# Patient Record
Sex: Female | Born: 1945
Health system: Southern US, Community
[De-identification: ages and names within clinical notes are randomized; demographics above are authoritative.]

## PROBLEM LIST (undated history)

## (undated) DIAGNOSIS — Z9981 Dependence on supplemental oxygen: Secondary | ICD-10-CM

## (undated) DIAGNOSIS — M1990 Unspecified osteoarthritis, unspecified site: Secondary | ICD-10-CM

## (undated) DIAGNOSIS — IMO0001 Reserved for inherently not codable concepts without codable children: Secondary | ICD-10-CM

## (undated) DIAGNOSIS — M549 Dorsalgia, unspecified: Secondary | ICD-10-CM

## (undated) DIAGNOSIS — G8929 Other chronic pain: Secondary | ICD-10-CM

## (undated) DIAGNOSIS — F419 Anxiety disorder, unspecified: Secondary | ICD-10-CM

## (undated) DIAGNOSIS — E079 Disorder of thyroid, unspecified: Secondary | ICD-10-CM

## (undated) DIAGNOSIS — K219 Gastro-esophageal reflux disease without esophagitis: Secondary | ICD-10-CM

## (undated) DIAGNOSIS — E039 Hypothyroidism, unspecified: Secondary | ICD-10-CM

## (undated) DIAGNOSIS — J449 Chronic obstructive pulmonary disease, unspecified: Secondary | ICD-10-CM

## (undated) DIAGNOSIS — D649 Anemia, unspecified: Secondary | ICD-10-CM

## (undated) DIAGNOSIS — I10 Essential (primary) hypertension: Secondary | ICD-10-CM

## (undated) HISTORY — PX: TOTAL HIP ARTHROPLASTY: SHX124

## (undated) HISTORY — DX: Other chronic pain: G89.29

## (undated) HISTORY — DX: Dorsalgia, unspecified: M54.9

## (undated) HISTORY — PX: OTHER SURGICAL HISTORY: SHX169

## (undated) HISTORY — PX: WRIST SURGERY: SHX841

## (undated) HISTORY — DX: Anxiety disorder, unspecified: F41.9

## (undated) HISTORY — PX: ABDOMINAL HYSTERECTOMY: SHX81

---

## 1998-05-29 HISTORY — PX: JOINT REPLACEMENT: SHX530

## 1998-07-19 ENCOUNTER — Inpatient Hospital Stay (HOSPITAL_COMMUNITY): Admission: AD | Admit: 1998-07-19 | Discharge: 1998-07-26 | Payer: Self-pay | Admitting: Surgery

## 1998-07-19 ENCOUNTER — Encounter: Payer: Self-pay | Admitting: Surgery

## 1998-07-21 ENCOUNTER — Encounter: Payer: Self-pay | Admitting: Surgery

## 1998-07-22 ENCOUNTER — Encounter: Payer: Self-pay | Admitting: Surgery

## 1998-07-23 ENCOUNTER — Encounter: Payer: Self-pay | Admitting: Surgery

## 1998-07-24 ENCOUNTER — Encounter: Payer: Self-pay | Admitting: Surgery

## 1998-07-25 ENCOUNTER — Encounter: Payer: Self-pay | Admitting: Surgery

## 1998-07-26 ENCOUNTER — Encounter: Payer: Self-pay | Admitting: Cardiothoracic Surgery

## 2000-09-26 ENCOUNTER — Encounter: Payer: Self-pay | Admitting: Family Medicine

## 2000-09-26 ENCOUNTER — Ambulatory Visit (HOSPITAL_COMMUNITY): Admission: RE | Admit: 2000-09-26 | Discharge: 2000-09-26 | Payer: Self-pay | Admitting: Family Medicine

## 2001-02-12 ENCOUNTER — Encounter: Payer: Self-pay | Admitting: Internal Medicine

## 2001-02-12 ENCOUNTER — Ambulatory Visit (HOSPITAL_COMMUNITY): Admission: RE | Admit: 2001-02-12 | Discharge: 2001-02-12 | Payer: Self-pay | Admitting: Internal Medicine

## 2001-02-19 ENCOUNTER — Ambulatory Visit (HOSPITAL_COMMUNITY): Admission: RE | Admit: 2001-02-19 | Discharge: 2001-02-19 | Payer: Self-pay | Admitting: Orthopedic Surgery

## 2001-02-19 ENCOUNTER — Encounter: Payer: Self-pay | Admitting: Orthopedic Surgery

## 2001-05-27 ENCOUNTER — Ambulatory Visit (HOSPITAL_COMMUNITY): Admission: RE | Admit: 2001-05-27 | Discharge: 2001-05-27 | Payer: Self-pay | Admitting: Internal Medicine

## 2001-05-27 ENCOUNTER — Encounter: Payer: Self-pay | Admitting: Internal Medicine

## 2002-01-23 ENCOUNTER — Emergency Department (HOSPITAL_COMMUNITY): Admission: EM | Admit: 2002-01-23 | Discharge: 2002-01-23 | Payer: Self-pay | Admitting: Internal Medicine

## 2002-01-23 ENCOUNTER — Encounter: Payer: Self-pay | Admitting: Internal Medicine

## 2002-03-24 ENCOUNTER — Encounter: Payer: Self-pay | Admitting: Internal Medicine

## 2002-03-24 ENCOUNTER — Ambulatory Visit (HOSPITAL_COMMUNITY): Admission: RE | Admit: 2002-03-24 | Discharge: 2002-03-24 | Payer: Self-pay | Admitting: Internal Medicine

## 2002-11-29 ENCOUNTER — Emergency Department (HOSPITAL_COMMUNITY): Admission: EM | Admit: 2002-11-29 | Discharge: 2002-11-29 | Payer: Self-pay | Admitting: Emergency Medicine

## 2002-11-29 ENCOUNTER — Encounter: Payer: Self-pay | Admitting: Emergency Medicine

## 2002-12-09 ENCOUNTER — Ambulatory Visit (HOSPITAL_COMMUNITY): Admission: RE | Admit: 2002-12-09 | Discharge: 2002-12-09 | Payer: Self-pay | Admitting: General Surgery

## 2002-12-09 ENCOUNTER — Encounter: Payer: Self-pay | Admitting: Internal Medicine

## 2003-05-30 HISTORY — PX: FRACTURE SURGERY: SHX138

## 2003-07-08 ENCOUNTER — Ambulatory Visit (HOSPITAL_COMMUNITY): Admission: RE | Admit: 2003-07-08 | Discharge: 2003-07-08 | Payer: Self-pay | Admitting: Internal Medicine

## 2003-09-10 ENCOUNTER — Emergency Department (HOSPITAL_COMMUNITY): Admission: EM | Admit: 2003-09-10 | Discharge: 2003-09-10 | Payer: Self-pay | Admitting: Emergency Medicine

## 2003-09-16 ENCOUNTER — Emergency Department (HOSPITAL_COMMUNITY): Admission: EM | Admit: 2003-09-16 | Discharge: 2003-09-17 | Payer: Self-pay | Admitting: Emergency Medicine

## 2003-09-23 ENCOUNTER — Ambulatory Visit (HOSPITAL_COMMUNITY): Admission: RE | Admit: 2003-09-23 | Discharge: 2003-09-23 | Payer: Self-pay | Admitting: Orthopedic Surgery

## 2003-12-02 ENCOUNTER — Encounter (HOSPITAL_COMMUNITY): Admission: RE | Admit: 2003-12-02 | Discharge: 2004-01-01 | Payer: Self-pay | Admitting: Orthopedic Surgery

## 2004-01-04 ENCOUNTER — Encounter (HOSPITAL_COMMUNITY): Admission: RE | Admit: 2004-01-04 | Discharge: 2004-02-03 | Payer: Self-pay | Admitting: Orthopedic Surgery

## 2004-02-03 ENCOUNTER — Encounter (HOSPITAL_COMMUNITY): Admission: RE | Admit: 2004-02-03 | Discharge: 2004-02-26 | Payer: Self-pay | Admitting: Orthopedic Surgery

## 2004-02-19 ENCOUNTER — Ambulatory Visit (HOSPITAL_COMMUNITY): Admission: RE | Admit: 2004-02-19 | Discharge: 2004-02-19 | Payer: Self-pay | Admitting: Internal Medicine

## 2004-02-29 ENCOUNTER — Encounter (HOSPITAL_COMMUNITY): Admission: RE | Admit: 2004-02-29 | Discharge: 2004-03-30 | Payer: Self-pay | Admitting: Orthopedic Surgery

## 2004-04-04 ENCOUNTER — Ambulatory Visit: Payer: Self-pay | Admitting: Orthopedic Surgery

## 2004-04-06 ENCOUNTER — Encounter (HOSPITAL_COMMUNITY): Admission: RE | Admit: 2004-04-06 | Discharge: 2004-05-06 | Payer: Self-pay | Admitting: Orthopedic Surgery

## 2004-05-09 ENCOUNTER — Encounter (HOSPITAL_COMMUNITY): Admission: RE | Admit: 2004-05-09 | Discharge: 2004-05-27 | Payer: Self-pay | Admitting: Orthopedic Surgery

## 2004-05-11 ENCOUNTER — Ambulatory Visit: Payer: Self-pay | Admitting: Orthopedic Surgery

## 2004-09-15 ENCOUNTER — Ambulatory Visit: Payer: Self-pay | Admitting: Orthopedic Surgery

## 2005-02-20 ENCOUNTER — Ambulatory Visit (HOSPITAL_COMMUNITY): Admission: RE | Admit: 2005-02-20 | Discharge: 2005-02-20 | Payer: Self-pay | Admitting: Internal Medicine

## 2005-05-29 HISTORY — PX: COLONOSCOPY: SHX174

## 2005-09-26 ENCOUNTER — Ambulatory Visit: Payer: Self-pay | Admitting: Internal Medicine

## 2005-09-26 ENCOUNTER — Ambulatory Visit (HOSPITAL_COMMUNITY): Admission: RE | Admit: 2005-09-26 | Discharge: 2005-09-26 | Payer: Self-pay | Admitting: Internal Medicine

## 2006-03-02 ENCOUNTER — Ambulatory Visit (HOSPITAL_COMMUNITY): Admission: RE | Admit: 2006-03-02 | Discharge: 2006-03-02 | Payer: Self-pay | Admitting: Internal Medicine

## 2007-01-17 ENCOUNTER — Emergency Department (HOSPITAL_COMMUNITY): Admission: EM | Admit: 2007-01-17 | Discharge: 2007-01-17 | Payer: Self-pay | Admitting: Emergency Medicine

## 2007-01-22 ENCOUNTER — Ambulatory Visit: Payer: Self-pay | Admitting: Internal Medicine

## 2007-02-22 ENCOUNTER — Ambulatory Visit: Payer: Self-pay | Admitting: Internal Medicine

## 2007-03-04 ENCOUNTER — Ambulatory Visit (HOSPITAL_COMMUNITY): Admission: RE | Admit: 2007-03-04 | Discharge: 2007-03-04 | Payer: Self-pay | Admitting: Internal Medicine

## 2007-09-26 ENCOUNTER — Emergency Department (HOSPITAL_COMMUNITY): Admission: EM | Admit: 2007-09-26 | Discharge: 2007-09-26 | Payer: Self-pay | Admitting: Emergency Medicine

## 2008-03-25 ENCOUNTER — Emergency Department (HOSPITAL_COMMUNITY): Admission: EM | Admit: 2008-03-25 | Discharge: 2008-03-25 | Payer: Self-pay | Admitting: Emergency Medicine

## 2008-04-22 ENCOUNTER — Ambulatory Visit (HOSPITAL_COMMUNITY): Admission: RE | Admit: 2008-04-22 | Discharge: 2008-04-22 | Payer: Self-pay | Admitting: Internal Medicine

## 2009-02-06 ENCOUNTER — Inpatient Hospital Stay (HOSPITAL_COMMUNITY): Admission: EM | Admit: 2009-02-06 | Discharge: 2009-02-12 | Payer: Self-pay | Admitting: Emergency Medicine

## 2009-05-12 ENCOUNTER — Ambulatory Visit (HOSPITAL_COMMUNITY): Admission: RE | Admit: 2009-05-12 | Discharge: 2009-05-12 | Payer: Self-pay | Admitting: Internal Medicine

## 2010-05-16 ENCOUNTER — Ambulatory Visit (HOSPITAL_COMMUNITY)
Admission: RE | Admit: 2010-05-16 | Discharge: 2010-05-16 | Payer: Self-pay | Source: Home / Self Care | Attending: Internal Medicine | Admitting: Internal Medicine

## 2010-06-18 ENCOUNTER — Encounter: Payer: Self-pay | Admitting: Internal Medicine

## 2010-06-19 ENCOUNTER — Encounter: Payer: Self-pay | Admitting: Internal Medicine

## 2010-09-02 LAB — BLOOD GAS, ARTERIAL
Acid-Base Excess: 1.4 mmol/L (ref 0.0–2.0)
Acid-base deficit: 1.1 mmol/L (ref 0.0–2.0)
Bicarbonate: 25 mEq/L — ABNORMAL HIGH (ref 20.0–24.0)
O2 Content: 3 L/min
O2 Saturation: 84.2 %
O2 Saturation: 97.6 %
Patient temperature: 37
TCO2: 23.2 mmol/L (ref 0–100)
pO2, Arterial: 142 mmHg — ABNORMAL HIGH (ref 80.0–100.0)
pO2, Arterial: 56.2 mmHg — ABNORMAL LOW (ref 80.0–100.0)

## 2010-09-02 LAB — BASIC METABOLIC PANEL
BUN: 11 mg/dL (ref 6–23)
BUN: 8 mg/dL (ref 6–23)
CO2: 34 mEq/L — ABNORMAL HIGH (ref 19–32)
CO2: 26 mEq/L (ref 19–32)
Calcium: 8.5 mg/dL (ref 8.4–10.5)
Chloride: 102 mEq/L (ref 96–112)
Chloride: 105 mEq/L (ref 96–112)
Chloride: 110 mEq/L (ref 96–112)
Creatinine, Ser: 0.67 mg/dL (ref 0.4–1.2)
Creatinine, Ser: 0.63 mg/dL (ref 0.4–1.2)
GFR calc Af Amer: >60 mL/min (ref >60)
GFR calc Af Amer: >60 mL/min (ref >60)
Glucose, Bld: 194 mg/dL — ABNORMAL HIGH (ref 70–99)
Potassium: 4.1 mEq/L (ref 3.5–5.1)
Potassium: 3.5 mEq/L (ref 3.5–5.1)
Sodium: 139 mEq/L (ref 135–145)

## 2010-09-02 LAB — CBC
HCT: 30.5 % — ABNORMAL LOW (ref 36.0–46.0)
HCT: 37.2 % (ref 36.0–46.0)
Hemoglobin: 12.6 g/dL (ref 12.0–15.0)
Hemoglobin: 9.8 g/dL — ABNORMAL LOW (ref 12.0–15.0)
MCHC: 34.2 g/dL (ref 30.0–36.0)
MCHC: 33.7 g/dL (ref 30.0–36.0)
MCHC: 34 g/dL (ref 30.0–36.0)
MCV: 83.6 fL (ref 78.0–100.0)
MCV: 83.2 fL (ref 78.0–100.0)
MCV: 83.7 fL (ref 78.0–100.0)
Platelets: 188 10*3/uL (ref 150–400)
Platelets: 211 10*3/uL (ref 150–400)
Platelets: 346 10*3/uL (ref 150–400)
RBC: 3.65 MIL/uL — ABNORMAL LOW (ref 3.87–5.11)
RBC: 3.35 MIL/uL — ABNORMAL LOW (ref 3.87–5.11)
RDW: 14.6 % (ref 11.5–15.5)
RDW: 14.6 % (ref 11.5–15.5)

## 2010-09-02 LAB — DIFFERENTIAL
Basophils Absolute: 0 10*3/uL (ref 0.0–0.1)
Basophils Absolute: 0 10*3/uL (ref 0.0–0.1)
Basophils Relative: 0 % (ref 0–1)
Basophils Relative: 1 % (ref 0–1)
Eosinophils Absolute: 0 10*3/uL (ref 0.0–0.7)
Eosinophils Absolute: 0 10*3/uL (ref 0.0–0.7)
Eosinophils Absolute: 0 10*3/uL (ref 0.0–0.7)
Eosinophils Absolute: 0.2 10*3/uL (ref 0.0–0.7)
Eosinophils Relative: 0 % (ref 0–5)
Eosinophils Relative: 1 % (ref 0–5)
Lymphocytes Relative: 19 % (ref 12–46)
Lymphs Abs: 0.8 10*3/uL (ref 0.7–4.0)
Lymphs Abs: 3 10*3/uL (ref 0.7–4.0)
Monocytes Absolute: 0.6 10*3/uL (ref 0.1–1.0)
Monocytes Absolute: 0.4 10*3/uL (ref 0.1–1.0)
Monocytes Absolute: 0.7 10*3/uL (ref 0.1–1.0)
Monocytes Relative: 10 % (ref 3–12)
Monocytes Relative: 7 % (ref 3–12)
Neutro Abs: 4.1 10*3/uL (ref 1.7–7.7)
Neutro Abs: 4.5 10*3/uL (ref 1.7–7.7)
Neutrophils Relative %: 63 % (ref 43–77)
Neutrophils Relative %: 69 % (ref 43–77)
Neutrophils Relative %: 79 % — ABNORMAL HIGH (ref 43–77)

## 2010-09-02 LAB — TSH: TSH: 0.028 u[IU]/mL — ABNORMAL LOW (ref 0.350–4.500)

## 2010-09-02 LAB — COMPREHENSIVE METABOLIC PANEL
ALT: 39 U/L — ABNORMAL HIGH (ref 0–35)
Albumin: 2.7 g/dL — ABNORMAL LOW (ref 3.5–5.2)
Calcium: 8.4 mg/dL (ref 8.4–10.5)
Glucose, Bld: 118 mg/dL — ABNORMAL HIGH (ref 70–99)
Sodium: 142 mEq/L (ref 135–145)
Total Protein: 6.1 g/dL (ref 6.0–8.3)

## 2010-10-11 NOTE — Assessment & Plan Note (Signed)
[facility]                             PULMONARY OFFICE NOTE   Marilyn Solomon, Marilyn Solomon                   MRN:          [id number]  DATE:02/22/2007                            DOB:          Mar 25, 1946    A 65 year old white female seen at Dr. Arnoldo Morale' request on August 26 for  evaluation of a right apical density on CT scan that actually had  present since 2003 and appeared to progress somewhat in the setting of  recent purulent tracheobronchitis.  The patient states she is completely  back to normal baseline which includes no significant dyspnea of  activity, albeit at a very sedentary lifestyle.  She denies any  variability in terms of dyspnea, significant cough, chest pain, fevers,  chills, sweats, and unintended weight loss.   PHYSICAL EXAMINATION:  She is a slightly anxious pleasant white female  in no acute distress weighing 138 pounds which is up 3 pounds .  HEENT:  Unremarkable.  Pharynx clear.  Lung fields reveal diminished breath sounds but no wheezing.  There is a regular rhythm without murmur, gallop, or rub.  ABDOMEN:  Soft, benign.  EXTREMITIES:  Warm without calf tenderness, cyanosis, clubbing, or  edema.   Chest x-ray shows no change in mild increased markings at the right  apex.   PFTs today revealed an FEV1 of 50% radicular ratio, 53% consistent with  moderate airflow obstruction with a diffusing capacity of only 66%.  She  did not improve after bronchodilators.   IMPRESSION:  Emphysematous chronic obstructive pulmonary disease,  moderate in nature associated with apical/pleural scarring in the right.  I cannot totally exclude cancer here, but the problem has been present  since 2003 and therefore might potentially represent a scar carcinoma  but more than likely simply represents a low-grade inflammatory process  with scar in such as one might see in atypical mycobacterial infection.   Using the strategy that the punishment  needs to fit the crime, other  than maintaining off smoking and using albuterol p.r.n., I do not  recommend any more aggressive therapy here but would like to see the  patient back every 3 months.  If, on the other hand she begins having  more frequent exacerbations of chronic obstructive pulmonary disease, I  would recommend she be maintained on Advair 280/50 b.i.d.  If she begins  having persistent purulent sputum, we might need to consider  bronchoscopy to look for opportunistic infections like pseudomonas or  mycobacterium avium-intracellulare.     Christena Deem. [name], MD, [facility]  Electronically Signed   MBW/MedQ  DD: 02/22/2007  DT: 02/22/2007  Job #: [id number]   cc:   Jana Hakim, M.D.

## 2010-10-11 NOTE — Assessment & Plan Note (Signed)
[facility]                             PULMONARY OFFICE NOTE   Marilyn Solomon, Marilyn Solomon                   MRN:          [id number]  DATE:01/22/2007                            DOB:          1946-05-01    CHIEF COMPLAINT:  Mass on the lung.   HISTORY:  This is a 65 year old white female who quit smoking eight  years ago and had no significant chronic respiratory complaints but  abruptly on August 17 developed fever with green sputum production and a  congested cough.  She was seen with a vague right upper lobe infiltrate  and a CT on August 21 with evidence of a progressive right apical  scarring, and therefore seen at Dr. Arnoldo Morale' request.  However, the  patient in the meantime feels fine except for a mass on the chest.   She does have a history of mild seasonal rhinitis but denies any  pleuritic pain, fevers, chills, sweats, orthopnea, PND, leg swelling, or  unintended weight loss.   PAST MEDICAL HISTORY:  Significant for hip replacement in March, 2000  and wrist surgery in April, 2005.  Hysterectomy.  Chronic sinus  problems.  She also has a history of hypothyroidism and osteoporosis.   Medications include Synthroid and Actonel.   ALLERGIES:  ASPIRIN causes hives.   SOCIAL HISTORY:  She quit smoking in 2000.  Denies any unusual travel,  pet, or hobby exposure.   FAMILY HISTORY:  Positive for cancer of the bone in one sister and  pancreas in one sister.   REVIEW OF SYSTEMS:  Taken in detail on the work sheet and negative  except as outlined above.   PHYSICAL EXAMINATION:  This is a depressed ambulatory white female who  has difficulty answering questions in a straightforward manner and in  fact did not understand how to fill out the questionnaire where it says,  previous history of problems.  She thought it meant absence of  problems, for example.  HEENT:  Unremarkable except for the fact that she is edentulous.  Oropharynx is clear.   Nasal turbinates are normal with no crusting.  Ear  canals are clear bilaterally.  NECK:  Supple without cervical adenopathy or tenderness.  LUNGS:  Lung fields reveal minimal generalized rhonchi but no  localization or wheezing.  Regular rate and rhythm without murmur, rub or gallop.  No increase in  P2.  ABDOMEN:  Soft, benign.  EXTREMITIES:  Warm without calf tenderness, clubbing, cyanosis or edema.  NEUROLOGIC:  No focal deficits or pathologic reflexes.  SKIN:  Warm and dry.   Chest x-ray is obtained and reveals minimal increase in right upper lobe  markings, compared to previous study from July 08, 2003 and  corresponds to a CT scan that shows multiple parenchymal densities in  the right upper lobe with diffuse airway thickening and mild mediastinal  adenopathy.  The CT scan on August 21 was compared to previous studies  available from August, 2003.   IMPRESSION:  This patient has had mild parenchymal scarring in the right  upper lobe that has progressed minimally over five years and therefore  is not likely to represent cancer, although a scar carcinoma is a  remote possibilty.  Her acute worsening is suggestive of an acute  tracheal bronchitis and/or a bronchiectatic flare and was treated  already appropriately with Augmentin.  It is very unlikely that we will  find anything other than perhaps a few atypical mycobacteria in her  right upper lobe, and I am not sure that this would warrant more  aggressive therapy than watchful waiting.  However, I think it is very  unlikely that she had a mass on her lung nor that bronchoscopy would  be of any value, at least in the short run.   I have arranged to see her back in four weeks for a set of PFTs and  chest x-ray.  We will certainly see her sooner if she has exacerbation  of cough or any history of a hemoptysis, pleuritic pain in the interim.     Marilyn Solomon. [name], MD, [facility]  Electronically Signed    MBW/MedQ  DD: 01/22/2007   DT: 01/23/2007  Job #: [id number]   cc:   Jana Hakim, M.D.

## 2010-10-14 NOTE — Op Note (Signed)
NAMESHONTEE, ELEBY            ACCOUNT NO.:  [id number]   MEDICAL RECORD NO.:  [id number]          PATIENT TYPE:  AMB   LOCATION:  DAY                           FACILITY:  [facility]   PHYSICIAN:  R. Garfield Cornea, M.D. DATE OF BIRTH:  09/23/1945   DATE OF PROCEDURE:  09/26/2005  DATE OF DISCHARGE:                                 OPERATIVE REPORT   PROCEDURE:  Screening colonoscopy.   INDICATION FOR PROCEDURE:  A 65 year old lady devoid of any GI symptoms,  referred out of the courtesy of Jana Hakim, M.D., for colorectal  cancer screening.  She has never had her lower GI tract imaged.  There is no  family history of colorectal neoplasia.  Colonoscopy is now being done as a  screening maneuver.  This approach has been discussed with the patient at  length, the potential risks, benefits, and alternatives have been reviewed,  questions answered.  Patient agreeable.   PROCEDURE NOTE:  O2 saturation, blood pressure, pulse, and respiration were  monitored throughout the entire procedure.   CONSCIOUS SEDATION:  Versed 3 mg IV, Demerol 50 mg IV.   INSTRUMENT USED:  Olympus video chip system, pediatric scope.   FINDINGS:  Digital rectal exam revealed no abnormalities.  Endoscopic  findings:  The prep was good.   Rectum:  Examination of the rectal mucosa including a retroflexed view of  the anal verge revealed no abnormalities.   Colon:  The colonic mucosa was surveyed from the rectosigmoid junction  through the left, transverse and right colon to the area of the appendiceal  orifice, ileocecal valve and cecum.  These structures were well-seen and  photographed for the record.  From this level the scope was slowly and  cautiously withdrawn.  All previously-mentioned mucosal surfaces were again  seen.  The colonic mucosa appeared normal aside from scattered left-sided  diverticula.  The patient tolerated the procedure well, was reacted in  endoscopy.   IMPRESSION:  1.  Normal  rectum.  2.  Left-sided diverticula, the remainder of the colonic mucosa appeared      normal.   RECOMMENDATIONS:  1.  Diverticulosis literature provided to Ms. Valere Dross.  2.  Routine screening colonoscopy in 10 years.      Bridgette Habermann, M.D.  Electronically Signed     RMR/MEDQ  D:  09/26/2005  T:  09/26/2005  Job:  [id number]   cc:   Jana Hakim, M.D.  Fax: [phone]

## 2010-10-14 NOTE — Op Note (Signed)
NAME:  Marilyn Solomon, Marilyn Solomon                      ACCOUNT NO.:  [id number]   MEDICAL RECORD NO.:  [id number]                   PATIENT TYPE:  AMB   LOCATION:  DAY                                  FACILITY:  [facility]   PHYSICIAN:  Carole Civil, M.D.           DATE OF BIRTH:  1945/11/03   DATE OF PROCEDURE:  09/23/2003  DATE OF DISCHARGE:                                 OPERATIVE REPORT   PREOPERATIVE DIAGNOSIS:  Comminuted distal radius fracture, right wrist.   POSTOPERATIVE DIAGNOSIS:  Comminuted distal radius fracture, right wrist.   OPERATION/PROCEDURE:  Closed reduction, external fixation and percutaneous  pinning, right wrist.   OPERATIVE FINDINGS:  Die punch fracture with comminution, right distal  radius.   SURGEON:  Carole Civil, M.D.   ANESTHESIA:  Axillary block with some IV sedation.   HISTORY:  A 64 year old female who fell on an outstretched hand, sustained a  closed comminuted fracture of the distal radius consistent with the Malone  die punch fracture classification.   INDICATIONS FOR PROCEDURE:  Unstable fracture pattern and articular surface  displacement.   DESCRIPTION OF PROCEDURE:  [name] was identified in the preoperative  holding area.  She was given preoperative antibiotics.  Her right wrist was  marked as the surgical site.  My initials were signed over the patient  placed mark.  She was taken to the operating room where she had some mild IV  sedation.  Bier block was given in the holding area.  Time-out was taken to  confirm the patient's identity, extremity and procedure and everyone on the  surgical team agreed and we proceeded with sterile prep and drape.   Incision over the second metacarpal.  This was bluntly dissected down to  bone. Two Shanz pins were placed.  These were checked at radiographs in AP  and lateral plane.  We then went to the forearm, made a skin incision there,  dissected down to bone with blunt dissection and placed  two Shanz pins and  checked those with AP and lateral films as well.  We placed 10 pounds of  traction on the thumb and index finger and checked a C-arm x-ray for  reduction.  AP and lateral films showed improvement in the alignment.  The  frame was then attached to the pins.  Further traction was placed until the  reduction was satisfactory.  We then irrigated the wounds, closed them with  3-0 nylon, tightened up the frame, removed the weights and passed two 0.045  K-wires.  X-rays were obtained of that as well and they were satisfactory.   The wounds were dressed sterilely.  The patient was taken to the recovery  room in stable condition.   Postoperative plan:  She will be discharged and follow up in two days for a  dressing change.  She can start active range of motion of the digits and  keep the hand elevated for the next  48 hours.  She has Phenergan and Tylox  for pain and nausea.      ___________________________________________                                            Carole Civil, M.D.   SEH/MEDQ  D:  09/23/2003  T:  09/23/2003  Job:  [id number]

## 2010-10-14 NOTE — H&P (Signed)
NAME:  Marilyn Solomon, Marilyn Solomon                      ACCOUNT NO.:  [id number]   MEDICAL RECORD NO.:  [id number]                   PATIENT TYPE:  EMS   LOCATION:  ED                                   FACILITY:  [facility]   PHYSICIAN:  Carole Civil, M.D.           DATE OF BIRTH:  02-Aug-1945   DATE OF ADMISSION:  09/16/2003  DATE OF DISCHARGE:  09/17/2003                                HISTORY & PHYSICAL   CHIEF COMPLAINT:  Right wrist pain.   HISTORY:  A 65 year old female fractured her right wrist.  Sustained a  distal radius die punch fracture.  She has a history of hypothyroidism,  lumbar disk disease for which she takes Tylox.  She is status post a left  bipolar hip replacement in 2000.  She has had both cataracts done and had a  hysterectomy.  She is also on Synthroid medication.  She has a family  history of heart disease, asthma, and cancer.  She is married and followed  by Jana Hakim, M.D.  She does not have any social habits.  Completed  her education through the 12th grade.   She does have osteoporosis, eczema, seasonal allergies, and sinus problems.   She complains of pain and deformity of the right wrist.  X-rays show a die  punch fracture with ulnar-positive variants, radial deviation, shortening of  the wrist, and comminution.   Recommendations are below.   PHYSICAL EXAMINATION:  VITAL SIGNS:  Weight was 140.  Pulse 82, respiratory  rate 20.  GENERAL:  She was otherwise well-developed and well-nourished.  Grooming and  hygiene were normal.  She was thin.  Nutrition appeared okay.  No  deformities other than the wrist were present.  CARDIOVASCULAR:  Swelling, varicosities:  None to minimal.  Palpation of  pulses normal.  Temperature normal.  No edema.  No tenderness.  LYMPH NODES:  Cervical spine negative.  MUSCULOSKELETAL:  Gait and station normal.  Right wrist is tender at the  distal radius and fracture site.  There is swelling.  Decreased range of  motion.  The  wrist appears stable, but of course, this was deferred  secondary to the condition.  Muscle tone was normal.  Strength and exam was  deferred.  Skin was intact.  NEURO:  Normal sensation, reflexes, coordination, and mood and affect.  She  was oriented x 3.   Radiographs were reviewed.  There is a die punch injury of the right distal  radius.  Will require external fixation to regain length and then  percutaneous pinning to stabilize the joint surfaces.   PLAN:  Code 25611.  ICD9 code is 813.41.     ___________________________________________                                         Carole Civil, M.D.   SEH/MEDQ  D:  09/17/2003  T:  09/17/2003  Job:  [id number]

## 2011-02-27 LAB — URINALYSIS, ROUTINE W REFLEX MICROSCOPIC
Glucose, UA: NEGATIVE
Hgb urine dipstick: NEGATIVE
Specific Gravity, Urine: >1.03 — ABNORMAL HIGH

## 2011-03-10 LAB — BASIC METABOLIC PANEL WITH GFR
BUN: 11
CO2: 31
Calcium: 9
Chloride: 100
Creatinine, Ser: 0.82
GFR calc non Af Amer: >60
Glucose, Bld: 105 — ABNORMAL HIGH
Potassium: 4.4
Sodium: 139

## 2011-04-17 ENCOUNTER — Emergency Department (HOSPITAL_COMMUNITY): Payer: Medicare Other

## 2011-04-17 ENCOUNTER — Emergency Department (HOSPITAL_COMMUNITY)
Admission: EM | Admit: 2011-04-17 | Discharge: 2011-04-17 | Disposition: A | Discharge status: Home / Self Care | Payer: Medicare Other | Attending: Emergency Medicine | Admitting: Emergency Medicine

## 2011-04-17 ENCOUNTER — Encounter: Payer: Self-pay | Admitting: *Deleted

## 2011-04-17 DIAGNOSIS — M545 Low back pain, unspecified: Secondary | ICD-10-CM | POA: Insufficient documentation

## 2011-04-17 DIAGNOSIS — E079 Disorder of thyroid, unspecified: Secondary | ICD-10-CM | POA: Insufficient documentation

## 2011-04-17 DIAGNOSIS — M549 Dorsalgia, unspecified: Secondary | ICD-10-CM

## 2011-04-17 DIAGNOSIS — R059 Cough, unspecified: Secondary | ICD-10-CM | POA: Insufficient documentation

## 2011-04-17 DIAGNOSIS — R05 Cough: Secondary | ICD-10-CM | POA: Insufficient documentation

## 2011-04-17 DIAGNOSIS — J40 Bronchitis, not specified as acute or chronic: Secondary | ICD-10-CM | POA: Insufficient documentation

## 2011-04-17 DIAGNOSIS — R079 Chest pain, unspecified: Secondary | ICD-10-CM | POA: Insufficient documentation

## 2011-04-17 DIAGNOSIS — Z87891 Personal history of nicotine dependence: Secondary | ICD-10-CM | POA: Insufficient documentation

## 2011-04-17 HISTORY — DX: Disorder of thyroid, unspecified: E07.9

## 2011-04-17 MED ORDER — AZITHROMYCIN 250 MG PO TABS: 250.0000 mg | ORAL_TABLET | Freq: Every day | ORAL | Status: AC

## 2011-04-17 MED ORDER — OXYCODONE-ACETAMINOPHEN 5-325 MG PO TABS: 2.0000 | ORAL_TABLET | ORAL | Status: AC | PRN

## 2011-04-17 NOTE — ED Notes (Addendum)
Pt alert and oriented x 4 with respirations even and unlabored.  NAD at this time.  Discharge instructions and Rx x 2 reviewed with pt and pt verbalized understanding.  Pt verbalized that daughter will transport home.

## 2011-04-17 NOTE — ED Notes (Signed)
Pt c/o right lower back pain since this am. Pt c/o chest congestion and green mucous x 2 weeks. Pt states that she had pain in her right breast yesterday that was worse with coughing. Pt also c/o fever off and on with the highest last night being 100.

## 2011-04-17 NOTE — ED Provider Notes (Signed)
History   This chart was scribed for [name], MD by Carolyne Littles. The patient was seen in room APA04/AP[facility]04 and the patient's care was started at 3:47PM.   CSN: [id number] Arrival date & time: 04/17/2011  3:27 PM   First MD Initiated Contact with Patient 04/17/11 1528      Chief Complaint  Patient presents with  . Back Pain   HPI Marilyn Solomon is a 65 y.o. female who presents to the Emergency Department complaining of constant moderate, non-radiating right sided lower back pain with associated mild right sided chest pain localized to under right breast, cough and congestion onset this morning and persistent since. Patient states right sided chest pain began yesterday but has improved significantly since onset although she notes mild residual right sided chest pain at this time. Patient notes lower back pain is aggravated by coughing, deep breathing and movement and relieved by nothing. Denies dysuria, urinary frequency, constipation, diarrhea, swelling of lower extremities, recent fall/trauma. Patient is a former smoker (Quit 12 years ago) and notes h/o pneumothorax 2x (last in 2010).  Past Medical History  Diagnosis Date  . Thyroid disease     History reviewed. No pertinent past surgical history.  History reviewed. No pertinent family history.  History  Substance Use Topics  . Smoking status: Former Research scientist (life sciences)  . Smokeless tobacco: Not on file  . Alcohol Use: No    OB History    Grav Para Term Preterm Abortions TAB SAB Ect Mult Living                  Review of Systems 10 Systems reviewed and are negative for acute change except as noted in the HPI.  Allergies  Vicodin  Home Medications   Current Outpatient Rx  Name Route Sig Dispense Refill  . ALBUTEROL SULFATE (2.5 MG/3ML) 0.083% IN NEBU Nebulization Take 2.5 mg by nebulization every 6 (six) hours as needed. For shortness of breath     . LEVOTHYROXINE SODIUM 75 MCG PO TABS Oral Take 75 mcg by mouth daily.        Marland Kitchen LORATADINE 10 MG PO TABS Oral Take 10 mg by mouth daily as needed. For allergies     . OMEPRAZOLE 40 MG PO CPDR Oral Take 40 mg by mouth daily.      . OXYCODONE-ACETAMINOPHEN 5-500 MG PO CAPS Oral Take 1 capsule by mouth every 6 (six) hours as needed. For pain     . AZITHROMYCIN 250 MG PO TABS Oral Take 1 tablet (250 mg total) by mouth daily. Take first 2 tablets together, then 1 every day until finished. 6 tablet 0  . OXYCODONE-ACETAMINOPHEN 5-325 MG PO TABS Oral Take 2 tablets by mouth every 4 (four) hours as needed for pain. 20 tablet 0    BP 117/78  Pulse 106  Temp(Src) 98.2 F (36.8 C) (Oral)  Resp 20  Ht 5\' 6"  (1.676 m)  Wt 125 lb (56.7 kg)  BMI 20.18 kg/m2  SpO2 94%  Physical Exam  Nursing note and vitals reviewed. Constitutional: She is oriented to person, place, and time. She appears well-developed and well-nourished. No distress.  HENT:  Head: Normocephalic and atraumatic.  Eyes: EOM are normal. Pupils are equal, round, and reactive to light.  Neck: Neck supple. No tracheal deviation present.  Cardiovascular: Normal rate, regular rhythm and normal heart sounds.   No murmur heard. Pulmonary/Chest: Effort normal. No respiratory distress. She has no wheezes.       Coarse breath  sounds bilaterally. Mild crackles to right side.   Abdominal: Soft. She exhibits no distension. There is no tenderness.  Musculoskeletal: Normal range of motion. She exhibits no edema.       Midline spine non-tender. No step-offs noted. Right para-lumbar tenderness.   Neurological: She is alert and oriented to person, place, and time. No sensory deficit.  Skin: Skin is warm and dry.  Psychiatric: She has a normal mood and affect. Her behavior is normal.    ED Course  Procedures (including critical care time)  DIAGNOSTIC STUDIES: Oxygen Saturation is 94% on room air, adequate by my interpretation.    COORDINATION OF CARE:    Labs Reviewed - No data to display Dg Chest 2  View  04/17/2011  *RADIOLOGY REPORT*  Clinical Data: Cough with chest and back pain.  CHEST - 2 VIEW  Comparison: 02/11/2009 and 03/25/2008.  Findings: There is chronic lung disease with stable asymmetric subpleural scarring in the right upper lobe and diffuse central airway thickening.  The heart size and mediastinal contours are stable. There is no confluent airspace opacity, pleural effusion or pneumothorax.  IMPRESSION: Stable chronic lung disease.  No acute cardiopulmonary process.  Original Report Authenticated By: Vivia Ewing, M.D.   Dg Lumbar Spine 2-3 Views  04/17/2011  *RADIOLOGY REPORT*  Clinical Data: Back pain.  LUMBAR SPINE - 2-3 VIEW  Comparison: None.  Findings: There is no fracture, subluxation, disc space narrowing, or facet arthritis.  IMPRESSION: Normal lumbar spine.  Original Report Authenticated By: Larey Seat, M.D.     1. Bronchitis   2. Back pain       MDM  Pain in the flank sounds musculoskeletal in nature.  Chest xray and spine films okay.  Looks like bronchitis.  Will treat with antibiotics and pain meds.      I personally performed the services described in this documentation, which was scribed in my presence. The recorded information has been reviewed and considered.      [name], MD 04/17/11 2136

## 2011-04-17 NOTE — ED Notes (Signed)
Patient denies pain and is resting comfortably.  

## 2011-05-16 ENCOUNTER — Other Ambulatory Visit (HOSPITAL_COMMUNITY): Payer: Self-pay | Admitting: Internal Medicine

## 2011-05-16 DIAGNOSIS — Z139 Encounter for screening, unspecified: Secondary | ICD-10-CM

## 2011-05-26 ENCOUNTER — Ambulatory Visit (HOSPITAL_COMMUNITY)
Admission: RE | Admit: 2011-05-26 | Discharge: 2011-05-26 | Disposition: A | Discharge status: Home / Self Care | Payer: Medicare Other | Source: Ambulatory Visit | Attending: Internal Medicine | Admitting: Internal Medicine

## 2011-05-26 DIAGNOSIS — Z1231 Encounter for screening mammogram for malignant neoplasm of breast: Secondary | ICD-10-CM | POA: Insufficient documentation

## 2011-05-26 DIAGNOSIS — Z139 Encounter for screening, unspecified: Secondary | ICD-10-CM

## 2012-04-12 ENCOUNTER — Emergency Department (HOSPITAL_COMMUNITY)
Admission: EM | Admit: 2012-04-12 | Discharge: 2012-04-12 | Disposition: A | Discharge status: Home / Self Care | Payer: Medicare PPO | Attending: Emergency Medicine | Admitting: Emergency Medicine

## 2012-04-12 ENCOUNTER — Emergency Department (HOSPITAL_COMMUNITY): Payer: Medicare PPO

## 2012-04-12 ENCOUNTER — Encounter (HOSPITAL_COMMUNITY): Payer: Self-pay | Admitting: *Deleted

## 2012-04-12 DIAGNOSIS — R05 Cough: Secondary | ICD-10-CM | POA: Insufficient documentation

## 2012-04-12 DIAGNOSIS — R059 Cough, unspecified: Secondary | ICD-10-CM | POA: Insufficient documentation

## 2012-04-12 DIAGNOSIS — J441 Chronic obstructive pulmonary disease with (acute) exacerbation: Secondary | ICD-10-CM | POA: Insufficient documentation

## 2012-04-12 DIAGNOSIS — E079 Disorder of thyroid, unspecified: Secondary | ICD-10-CM | POA: Insufficient documentation

## 2012-04-12 DIAGNOSIS — J939 Pneumothorax, unspecified: Secondary | ICD-10-CM

## 2012-04-12 DIAGNOSIS — R110 Nausea: Secondary | ICD-10-CM | POA: Insufficient documentation

## 2012-04-12 DIAGNOSIS — Z79899 Other long term (current) drug therapy: Secondary | ICD-10-CM | POA: Insufficient documentation

## 2012-04-12 DIAGNOSIS — J9383 Other pneumothorax: Secondary | ICD-10-CM | POA: Insufficient documentation

## 2012-04-12 DIAGNOSIS — K219 Gastro-esophageal reflux disease without esophagitis: Secondary | ICD-10-CM | POA: Insufficient documentation

## 2012-04-12 HISTORY — DX: Chronic obstructive pulmonary disease, unspecified: J44.9

## 2012-04-12 HISTORY — DX: Gastro-esophageal reflux disease without esophagitis: K21.9

## 2012-04-12 HISTORY — DX: Reserved for inherently not codable concepts without codable children: IMO0001

## 2012-04-12 MED ORDER — PREDNISONE 10 MG PO TABS: 20.0000 mg | ORAL_TABLET | Freq: Two times a day (BID) | ORAL | Status: DC

## 2012-04-12 MED ORDER — IPRATROPIUM BROMIDE 0.02 % IN SOLN
0.5000 mg | RESPIRATORY_TRACT | Status: DC
  Administered 2012-04-12: 0.5 mg via RESPIRATORY_TRACT
  Filled 2012-04-12 (×2): qty 2.5

## 2012-04-12 MED ORDER — ALBUTEROL SULFATE (5 MG/ML) 0.5% IN NEBU
2.5000 mg | INHALATION_SOLUTION | RESPIRATORY_TRACT | Status: DC
  Administered 2012-04-12: 2.5 mg via RESPIRATORY_TRACT
  Filled 2012-04-12: qty 0.5

## 2012-04-12 MED ORDER — AZITHROMYCIN 250 MG PO TABS: 250.0000 mg | ORAL_TABLET | Freq: Every day | ORAL | Status: DC

## 2012-04-12 NOTE — ED Provider Notes (Signed)
History   This chart was scribed for [name], MD by Shona Needles, ED Scribe. The patient was seen in room APA19/APA19. Patient's care was started at 1135.  CSN: [id number]  Arrival date & time 04/12/12  1135   First MD Initiated Contact with Patient 04/12/12 1208      Chief Complaint  Patient presents with  . Shortness of Breath   Patient is a 66 y.o. female presenting with shortness of breath. The history is provided by the patient. No language interpreter was used.  Shortness of Breath  The current episode started yesterday. The onset was gradual. The problem occurs continuously. The problem has been unchanged. The problem is severe. Nothing relieves the symptoms. Nothing aggravates the symptoms. Associated symptoms include cough and shortness of breath. Pertinent negatives include no chest pain. The cough is productive. Nothing relieves the cough.    Marilyn Solomon is a 66 y.o. female who presents to the Emergency Department complaining of 3 days of new, constant, unchanged SOB, with associate congestion, productive cough, and nausea. Pain is described dissimilar to that of collapsed lung. She does not recall the context of onset. Pt denies sick contact. Symptoms have not been treated PTA. No fever, chills,rhinorrhea, chest pain, or n/v/d. Medical Hx includes COPD and Reflux.   Past Medical History  Diagnosis Date  . Thyroid disease   . COPD (chronic obstructive pulmonary disease)   . Reflux     Past Surgical History  Procedure Date  . Joint replacement   . Total hip arthroplasty   . Fracture surgery   . Wrist surgery   . Abdominal hysterectomy     History reviewed. No pertinent family history.  History  Substance Use Topics  . Smoking status: Former Research scientist (life sciences)  . Smokeless tobacco: Not on file  . Alcohol Use: No   Review of Systems  HENT: Positive for congestion.   Respiratory: Positive for cough and shortness of breath.   Cardiovascular: Negative for chest  pain.  Gastrointestinal: Positive for nausea.  All other systems reviewed and are negative.    Allergies  Vicodin  Home Medications   Current Outpatient Rx  Name  Route  Sig  Dispense  Refill  . ALBUTEROL SULFATE (2.5 MG/3ML) 0.083% IN NEBU   Nebulization   Take 2.5 mg by nebulization every 6 (six) hours as needed. For shortness of breath          . LEVOTHYROXINE SODIUM 75 MCG PO TABS   Oral   Take 75 mcg by mouth daily.           Marland Kitchen LORATADINE 10 MG PO TABS   Oral   Take 10 mg by mouth daily as needed. For allergies          . OMEPRAZOLE 40 MG PO CPDR   Oral   Take 40 mg by mouth daily.           . OXYCODONE-ACETAMINOPHEN 5-500 MG PO CAPS   Oral   Take 1 capsule by mouth every 6 (six) hours as needed. For pain            BP 109/70  Pulse 102  Temp 97.6 F (36.4 C) (Oral)  Resp 18  Ht 5\' 6"  (1.676 m)  Wt 107 lb (48.535 kg)  BMI 17.27 kg/m2  SpO2 94%  Physical Exam  Constitutional: She is oriented to person, place, and time. She appears well-developed and well-nourished.  HENT:  Head: Normocephalic and atraumatic.  Mouth/Throat: Oropharynx is  clear and moist. No oropharyngeal exudate.  Eyes: EOM are normal. Pupils are equal, round, and reactive to light. No scleral icterus.  Neck: Normal range of motion. Neck supple. No tracheal deviation present.  Cardiovascular: Normal rate and regular rhythm.   Pulmonary/Chest: Effort normal. No respiratory distress.       Bilateral expiratory wheezes.  Abdominal: Soft. There is no tenderness.  Musculoskeletal: Normal range of motion. She exhibits no tenderness.  Lymphadenopathy:    She has no cervical adenopathy.  Neurological: She is alert and oriented to person, place, and time. No cranial nerve deficit.  Skin: Skin is warm and dry. No rash noted.  Psychiatric: She has a normal mood and affect. Her behavior is normal.    ED Course  Procedures DIAGNOSTIC STUDIES: Oxygen Saturation is 94% on room air,  normal by my interpretation.    COORDINATION OF CARE: 12:10- Evaluated Pt. Pt is awake, alert, and without distress. 12:15- Ordered DG Chest 2 View 1 time imaging.   Labs Reviewed - No data to display No results found.   No diagnosis found.    MDM  The patient presents here with shortness of breath, cough, and pain in her back for the past several days.  The chest xray shows copd with a small apical ptx.  She will be treated with antibiotics, prednisone, continued nebs.  I have also advised her that she needs to return in the next 1-2 days for follow up chest xray.  As it is Friday, she will likely need to return here.  Return sooner if she worsens.      I personally performed the services described in this documentation, which was scribed in my presence. The recorded information has been reviewed and is accurate.       [name], MD 04/12/12 1536

## 2012-04-12 NOTE — ED Notes (Signed)
Upper back pain, nausea,since Tuesday,  Congestion , green sputum with fever.

## 2012-04-14 ENCOUNTER — Other Ambulatory Visit (HOSPITAL_COMMUNITY): Payer: Self-pay | Admitting: Emergency Medicine

## 2012-04-14 ENCOUNTER — Ambulatory Visit (HOSPITAL_COMMUNITY)
Admission: RE | Admit: 2012-04-14 | Discharge: 2012-04-14 | Disposition: A | Discharge status: Home / Self Care | Payer: Medicare PPO | Source: Ambulatory Visit | Attending: Emergency Medicine | Admitting: Emergency Medicine

## 2012-04-14 DIAGNOSIS — J9383 Other pneumothorax: Secondary | ICD-10-CM | POA: Insufficient documentation

## 2012-04-14 DIAGNOSIS — J939 Pneumothorax, unspecified: Secondary | ICD-10-CM

## 2012-04-14 DIAGNOSIS — I7090 Unspecified atherosclerosis: Secondary | ICD-10-CM | POA: Insufficient documentation

## 2012-04-14 NOTE — ED Provider Notes (Signed)
Repeat PA and lateral chest today. Discussed findings with patient and her daughter-in-law. Right apical pneumothorax is improved. We discussed the concern for a mass in the right upper lobe. She will followup with her primary care Dr. Who is Dr. Darlyne Russian in [place].  She understands the need for a CT scan of her chest. I offered to have the test done today in emergency department. She opted to go home today and follow up with her own doctor. She understands the possibility of cancer. They both agreed to the plan.  Nat Christen, MD 04/14/12 [phone]

## 2012-04-16 ENCOUNTER — Other Ambulatory Visit (HOSPITAL_COMMUNITY): Payer: Self-pay | Admitting: Internal Medicine

## 2012-04-16 DIAGNOSIS — R911 Solitary pulmonary nodule: Secondary | ICD-10-CM

## 2012-04-17 ENCOUNTER — Encounter (HOSPITAL_COMMUNITY): Payer: Self-pay

## 2012-04-17 ENCOUNTER — Ambulatory Visit (HOSPITAL_COMMUNITY)
Admission: RE | Admit: 2012-04-17 | Discharge: 2012-04-17 | Disposition: A | Discharge status: Home / Self Care | Payer: Medicare PPO | Source: Ambulatory Visit | Attending: Internal Medicine | Admitting: Internal Medicine

## 2012-04-17 DIAGNOSIS — R059 Cough, unspecified: Secondary | ICD-10-CM | POA: Insufficient documentation

## 2012-04-17 DIAGNOSIS — R05 Cough: Secondary | ICD-10-CM | POA: Insufficient documentation

## 2012-04-17 DIAGNOSIS — R911 Solitary pulmonary nodule: Secondary | ICD-10-CM

## 2012-04-17 DIAGNOSIS — J984 Other disorders of lung: Secondary | ICD-10-CM | POA: Insufficient documentation

## 2012-04-17 LAB — BASIC METABOLIC PANEL
BUN: 14 mg/dL (ref 6–23)
Chloride: 99 mEq/L (ref 96–112)
GFR calc Af Amer: >90 mL/min (ref >90)
GFR calc non Af Amer: 88 mL/min — ABNORMAL LOW (ref >90)
Potassium: 4 mEq/L (ref 3.5–5.1)
Sodium: 141 mEq/L (ref 135–145)

## 2012-04-17 MED ORDER — IOHEXOL 300 MG/ML  SOLN
80.0000 mL | Freq: Once | INTRAMUSCULAR | Status: AC | PRN
  Administered 2012-04-17: 80 mL via INTRAVENOUS

## 2012-06-10 ENCOUNTER — Other Ambulatory Visit (HOSPITAL_COMMUNITY): Payer: Self-pay | Admitting: General Surgery

## 2012-06-10 DIAGNOSIS — Z139 Encounter for screening, unspecified: Secondary | ICD-10-CM

## 2012-06-17 ENCOUNTER — Ambulatory Visit (HOSPITAL_COMMUNITY): Payer: Medicare PPO

## 2012-06-24 ENCOUNTER — Ambulatory Visit (HOSPITAL_COMMUNITY)
Admission: RE | Admit: 2012-06-24 | Discharge: 2012-06-24 | Disposition: A | Discharge status: Home / Self Care | Payer: Medicare PPO | Source: Ambulatory Visit | Attending: General Surgery | Admitting: General Surgery

## 2012-06-24 DIAGNOSIS — Z139 Encounter for screening, unspecified: Secondary | ICD-10-CM

## 2012-06-24 DIAGNOSIS — Z1231 Encounter for screening mammogram for malignant neoplasm of breast: Secondary | ICD-10-CM | POA: Insufficient documentation

## 2012-06-26 ENCOUNTER — Other Ambulatory Visit: Payer: Self-pay | Admitting: General Surgery

## 2012-06-26 DIAGNOSIS — R928 Other abnormal and inconclusive findings on diagnostic imaging of breast: Secondary | ICD-10-CM

## 2012-07-17 ENCOUNTER — Other Ambulatory Visit (HOSPITAL_COMMUNITY): Payer: Self-pay | Admitting: General Surgery

## 2012-07-17 ENCOUNTER — Ambulatory Visit (HOSPITAL_COMMUNITY)
Admission: RE | Admit: 2012-07-17 | Discharge: 2012-07-17 | Disposition: A | Discharge status: Home / Self Care | Payer: Medicare PPO | Source: Ambulatory Visit | Attending: General Surgery | Admitting: General Surgery

## 2012-07-17 ENCOUNTER — Ambulatory Visit (HOSPITAL_COMMUNITY)
Admission: RE | Admit: 2012-07-17 | Discharge: 2012-07-17 | Disposition: A | Discharge status: Home / Self Care | Payer: Medicare PPO | Source: Ambulatory Visit | Attending: Internal Medicine | Admitting: Internal Medicine

## 2012-07-17 ENCOUNTER — Other Ambulatory Visit: Payer: Self-pay | Admitting: General Surgery

## 2012-07-17 DIAGNOSIS — R928 Other abnormal and inconclusive findings on diagnostic imaging of breast: Secondary | ICD-10-CM

## 2013-06-16 ENCOUNTER — Other Ambulatory Visit (HOSPITAL_COMMUNITY): Payer: Self-pay | Admitting: Internal Medicine

## 2013-06-16 DIAGNOSIS — Z139 Encounter for screening, unspecified: Secondary | ICD-10-CM

## 2013-06-26 ENCOUNTER — Ambulatory Visit (HOSPITAL_COMMUNITY)
Admission: RE | Admit: 2013-06-26 | Discharge: 2013-06-26 | Disposition: A | Discharge status: Home / Self Care | Payer: Medicare PPO | Source: Ambulatory Visit | Attending: Internal Medicine | Admitting: Internal Medicine

## 2013-06-26 DIAGNOSIS — Z139 Encounter for screening, unspecified: Secondary | ICD-10-CM

## 2013-06-26 DIAGNOSIS — Z1231 Encounter for screening mammogram for malignant neoplasm of breast: Secondary | ICD-10-CM | POA: Insufficient documentation

## 2013-12-04 NOTE — Discharge Instructions (Signed)

## 2013-12-05 ENCOUNTER — Encounter (HOSPITAL_COMMUNITY)
Admission: RE | Admit: 2013-12-05 | Discharge: 2013-12-05 | Disposition: A | Discharge status: Home / Self Care | Payer: Medicare PPO | Source: Ambulatory Visit | Attending: Family Medicine | Admitting: Family Medicine

## 2013-12-05 ENCOUNTER — Encounter (HOSPITAL_COMMUNITY): Payer: Self-pay

## 2013-12-05 DIAGNOSIS — M810 Age-related osteoporosis without current pathological fracture: Secondary | ICD-10-CM | POA: Diagnosis not present

## 2013-12-05 MED ORDER — ZOLEDRONIC ACID 5 MG/100ML IV SOLN
5.0000 mg | Freq: Once | INTRAVENOUS | Status: AC
  Administered 2013-12-05: 5 mg via INTRAVENOUS
  Filled 2013-12-05: qty 100

## 2013-12-05 MED ORDER — SODIUM CHLORIDE 0.9 % IV SOLN
Freq: Once | INTRAVENOUS | Status: AC
  Administered 2013-12-05: 250 mL via INTRAVENOUS

## 2014-01-21 ENCOUNTER — Ambulatory Visit: Payer: Self-pay | Admitting: Pain Medicine

## 2014-07-24 DIAGNOSIS — E038 Other specified hypothyroidism: Secondary | ICD-10-CM | POA: Diagnosis not present

## 2014-07-24 DIAGNOSIS — F064 Anxiety disorder due to known physiological condition: Secondary | ICD-10-CM | POA: Diagnosis not present

## 2014-07-24 DIAGNOSIS — M810 Age-related osteoporosis without current pathological fracture: Secondary | ICD-10-CM | POA: Diagnosis not present

## 2014-07-24 DIAGNOSIS — M4000 Postural kyphosis, site unspecified: Secondary | ICD-10-CM | POA: Diagnosis not present

## 2014-07-28 ENCOUNTER — Ambulatory Visit (HOSPITAL_COMMUNITY)
Admission: RE | Admit: 2014-07-28 | Discharge: 2014-07-28 | Disposition: A | Discharge status: Home / Self Care | Payer: Medicare PPO | Source: Ambulatory Visit | Attending: Family Medicine | Admitting: Family Medicine

## 2014-07-28 ENCOUNTER — Other Ambulatory Visit (HOSPITAL_COMMUNITY): Payer: Self-pay | Admitting: Family Medicine

## 2014-07-28 DIAGNOSIS — M545 Low back pain: Secondary | ICD-10-CM | POA: Diagnosis not present

## 2014-07-28 DIAGNOSIS — M4004 Postural kyphosis, thoracic region: Secondary | ICD-10-CM

## 2014-07-28 DIAGNOSIS — M4000 Postural kyphosis, site unspecified: Secondary | ICD-10-CM

## 2014-07-28 DIAGNOSIS — M546 Pain in thoracic spine: Secondary | ICD-10-CM | POA: Insufficient documentation

## 2014-07-28 DIAGNOSIS — Z1231 Encounter for screening mammogram for malignant neoplasm of breast: Secondary | ICD-10-CM

## 2014-07-28 DIAGNOSIS — M8588 Other specified disorders of bone density and structure, other site: Secondary | ICD-10-CM | POA: Insufficient documentation

## 2014-07-28 DIAGNOSIS — M4854XA Collapsed vertebra, not elsewhere classified, thoracic region, initial encounter for fracture: Secondary | ICD-10-CM | POA: Insufficient documentation

## 2014-07-31 DIAGNOSIS — J440 Chronic obstructive pulmonary disease with acute lower respiratory infection: Secondary | ICD-10-CM | POA: Diagnosis not present

## 2014-07-31 DIAGNOSIS — J208 Acute bronchitis due to other specified organisms: Secondary | ICD-10-CM | POA: Diagnosis not present

## 2014-08-03 ENCOUNTER — Ambulatory Visit (HOSPITAL_COMMUNITY)
Admission: RE | Admit: 2014-08-03 | Discharge: 2014-08-03 | Disposition: A | Discharge status: Home / Self Care | Payer: Commercial Managed Care - HMO | Source: Ambulatory Visit | Attending: Family Medicine | Admitting: Family Medicine

## 2014-08-03 DIAGNOSIS — Z1231 Encounter for screening mammogram for malignant neoplasm of breast: Secondary | ICD-10-CM | POA: Insufficient documentation

## 2014-08-29 ENCOUNTER — Emergency Department (HOSPITAL_COMMUNITY)
Admission: EM | Admit: 2014-08-29 | Discharge: 2014-08-29 | Disposition: A | Discharge status: Home / Self Care | Payer: Commercial Managed Care - HMO | Attending: Emergency Medicine | Admitting: Emergency Medicine

## 2014-08-29 ENCOUNTER — Encounter (HOSPITAL_COMMUNITY): Payer: Self-pay

## 2014-08-29 ENCOUNTER — Emergency Department (HOSPITAL_COMMUNITY): Payer: Commercial Managed Care - HMO

## 2014-08-29 DIAGNOSIS — Z79899 Other long term (current) drug therapy: Secondary | ICD-10-CM | POA: Insufficient documentation

## 2014-08-29 DIAGNOSIS — J209 Acute bronchitis, unspecified: Secondary | ICD-10-CM

## 2014-08-29 DIAGNOSIS — Z7952 Long term (current) use of systemic steroids: Secondary | ICD-10-CM | POA: Diagnosis not present

## 2014-08-29 DIAGNOSIS — Z8719 Personal history of other diseases of the digestive system: Secondary | ICD-10-CM | POA: Diagnosis not present

## 2014-08-29 DIAGNOSIS — R05 Cough: Secondary | ICD-10-CM | POA: Diagnosis not present

## 2014-08-29 DIAGNOSIS — J449 Chronic obstructive pulmonary disease, unspecified: Secondary | ICD-10-CM | POA: Diagnosis not present

## 2014-08-29 DIAGNOSIS — M549 Dorsalgia, unspecified: Secondary | ICD-10-CM | POA: Diagnosis not present

## 2014-08-29 DIAGNOSIS — Z792 Long term (current) use of antibiotics: Secondary | ICD-10-CM | POA: Insufficient documentation

## 2014-08-29 DIAGNOSIS — J440 Chronic obstructive pulmonary disease with acute lower respiratory infection: Secondary | ICD-10-CM | POA: Diagnosis not present

## 2014-08-29 DIAGNOSIS — J441 Chronic obstructive pulmonary disease with (acute) exacerbation: Secondary | ICD-10-CM | POA: Insufficient documentation

## 2014-08-29 DIAGNOSIS — H9202 Otalgia, left ear: Secondary | ICD-10-CM | POA: Diagnosis present

## 2014-08-29 DIAGNOSIS — H6502 Acute serous otitis media, left ear: Secondary | ICD-10-CM | POA: Diagnosis not present

## 2014-08-29 DIAGNOSIS — Z87891 Personal history of nicotine dependence: Secondary | ICD-10-CM | POA: Diagnosis not present

## 2014-08-29 MED ORDER — PREDNISONE 10 MG PO TABS: 20.0000 mg | ORAL_TABLET | Freq: Two times a day (BID) | ORAL | Status: DC

## 2014-08-29 MED ORDER — AZITHROMYCIN 250 MG PO TABS: ORAL_TABLET | ORAL | Status: DC

## 2014-08-29 MED ORDER — GUAIFENESIN-CODEINE 100-10 MG/5ML PO SYRP: 5.0000 mL | ORAL_SOLUTION | Freq: Three times a day (TID) | ORAL | Status: DC | PRN

## 2014-08-29 MED ORDER — IPRATROPIUM-ALBUTEROL 0.5-2.5 (3) MG/3ML IN SOLN
3.0000 mL | Freq: Once | RESPIRATORY_TRACT | Status: AC
  Administered 2014-08-29: 3 mL via RESPIRATORY_TRACT
  Filled 2014-08-29: qty 3

## 2014-08-29 NOTE — ED Notes (Signed)
Patient given discharge instruction, verbalized understand. Patient ambulatory out of the department.  

## 2014-08-29 NOTE — ED Provider Notes (Signed)
CSN: [id number]     Arrival date & time 08/29/14  0909 History   First MD Initiated Contact with Patient 08/29/14 1005     Chief Complaint  Patient presents with  . Otalgia     (Consider location/radiation/quality/duration/timing/severity/associated sxs/prior Treatment) Patient is a 69 y.o. female presenting with ear pain. The history is provided by the patient.  Otalgia Location:  Left Quality:  Aching Severity:  Moderate Onset quality:  Gradual Duration:  3 days Timing:  Constant Progression:  Worsening Chronicity:  New Associated symptoms: congestion and cough    Marilyn Solomon is a 70 y.o. female who presents to the ED with left ear pain and upper back pain that started 3 days ago. She has used OTC ear drops without relief.  She reports having a cough for 3 days that is productive. Patient reports soreness in upper back from coughing.   Past Medical History  Diagnosis Date  . Thyroid disease   . COPD (chronic obstructive pulmonary disease)   . Reflux    Past Surgical History  Procedure Laterality Date  . Joint replacement    . Total hip arthroplasty    . Fracture surgery    . Wrist surgery    . Abdominal hysterectomy     History reviewed. No pertinent family history. History  Substance Use Topics  . Smoking status: Former Research scientist (life sciences)  . Smokeless tobacco: Not on file  . Alcohol Use: No   OB History    No data available     Review of Systems  HENT: Positive for congestion and ear pain.   Respiratory: Positive for cough.   Musculoskeletal: Positive for back pain.  all other systems negative    Allergies  Penicillins and Vicodin  Home Medications   Prior to Admission medications   Medication Sig Start Date End Date Taking? Authorizing Provider  ALPRAZolam Duanne Moron) 1 MG tablet Take 1 mg by mouth 3 (three) times daily as needed for anxiety.  08/10/14  Yes Historical Provider, MD  levothyroxine (SYNTHROID, LEVOTHROID) 75 MCG tablet Take 75 mcg by mouth  daily.     Yes Historical Provider, MD  oxyCODONE (ROXICODONE) 15 MG immediate release tablet Take 15 mg by mouth every 4 (four) hours as needed for pain.   Yes Historical Provider, MD  azithromycin (ZITHROMAX Z-PAK) 250 MG tablet Take 2 tablets today and then one tablet PO daily with food. 08/29/14   Jezreel Sisk Bunnie Pion, NP  guaiFENesin-codeine (ROBITUSSIN AC) 100-10 MG/5ML syrup Take 5 mLs by mouth 3 (three) times daily as needed for cough. 08/29/14   Faruq Rosenberger Bunnie Pion, NP  predniSONE (DELTASONE) 10 MG tablet Take 2 tablets (20 mg total) by mouth 2 (two) times daily with a meal. 08/29/14   Elisabeth Strom Bunnie Pion, NP  Vitamin D, Ergocalciferol, (DRISDOL) 50000 UNITS CAPS capsule Take 50,000 Units by mouth once a week. 07/31/14   Historical Provider, MD   BP 123/96 mmHg  Pulse 107  Temp(Src) 98.5 F (36.9 C) (Oral)  Resp 16  Ht 5\' 5"  (1.651 m)  Wt 112 lb (50.803 kg)  BMI 18.64 kg/m2  SpO2 92% Physical Exam  Constitutional: She is oriented to person, place, and time. She appears well-developed and well-nourished.  HENT:  Head: Normocephalic.  Right Ear: Tympanic membrane normal.  Left Ear: There is tenderness. Tympanic membrane is erythematous.  Eyes: Conjunctivae and EOM are normal.  Neck: Neck supple.  Cardiovascular: Normal rate and regular rhythm.   Pulmonary/Chest: Effort normal. She has wheezes. She  has no rales.  Abdominal: Soft. There is no tenderness.  Musculoskeletal: Normal range of motion.       Thoracic back: She exhibits tenderness.       Back:  Neurological: She is alert and oriented to person, place, and time. No cranial nerve deficit.  Skin: Skin is warm and dry.  Psychiatric: She has a normal mood and affect. Her behavior is normal.  Nursing note and vitals reviewed.   ED Course  Procedures (including critical care time) Labs Review Labs Reviewed - No data to display  Imaging Review Dg Chest 2 View  08/29/2014   CLINICAL DATA:  Cough, otalgia, back pain, shoulder blade pain starting  last night  EXAM: CHEST  2 VIEW  COMPARISON:  04/14/2012  FINDINGS: Lungs are hyperexpanded. There is reticular nodular type scarring at the apices with milder reticular scarring noted in the anterior lung bases mostly in the right middle lobe. No lung consolidation or edema. No pleural effusion or pneumothorax.  Cardiac silhouette is normal in size. No mediastinal or hilar masses or evidence of adenopathy.  Mild compression deformity of a mid thoracic vertebra, stable from a chest CT dated 03/2012. Bony thorax is diffusely demineralized.  IMPRESSION: 1. No acute cardiopulmonary disease. 2. COPD and areas of chronic lung scarring.   Electronically Signed   By: Lajean Manes M.D.   On: 08/29/2014 11:36   Dr. Lacinda Axon in to examine the patient.   MDM  69 y.o. female with left ear pain, cough, congestion and upper back pain x 3 days. Hx of COPD.  Stable for d/c without shortness of breath or respiratory distress. Will treat for bronchitis and ear pain. She will follow up with her PCP. She will return here as needed for problems.  Final diagnoses:  Acute serous otitis media of left ear, recurrence not specified  COPD (chronic obstructive pulmonary disease) with acute bronchitis      Ashley Murrain, NP 08/29/14 [place], MD 09/01/14 1245

## 2014-08-29 NOTE — ED Notes (Signed)
MD and PA at the bedside.

## 2014-08-29 NOTE — ED Notes (Signed)
Resp paged for breathing treatment.  

## 2014-08-29 NOTE — ED Notes (Signed)
Pt c/o of sharp left ear pain and lower back pain since Thursday. OTC ear drops provided no relief.

## 2014-09-22 DIAGNOSIS — R7309 Other abnormal glucose: Secondary | ICD-10-CM | POA: Diagnosis not present

## 2014-09-22 DIAGNOSIS — E782 Mixed hyperlipidemia: Secondary | ICD-10-CM | POA: Diagnosis not present

## 2014-09-22 DIAGNOSIS — J440 Chronic obstructive pulmonary disease with acute lower respiratory infection: Secondary | ICD-10-CM | POA: Diagnosis not present

## 2014-09-22 DIAGNOSIS — M4000 Postural kyphosis, site unspecified: Secondary | ICD-10-CM | POA: Diagnosis not present

## 2014-09-22 DIAGNOSIS — E039 Hypothyroidism, unspecified: Secondary | ICD-10-CM | POA: Diagnosis not present

## 2014-09-22 DIAGNOSIS — F064 Anxiety disorder due to known physiological condition: Secondary | ICD-10-CM | POA: Diagnosis not present

## 2014-09-24 DIAGNOSIS — R198 Other specified symptoms and signs involving the digestive system and abdomen: Secondary | ICD-10-CM | POA: Diagnosis not present

## 2014-11-20 DIAGNOSIS — M4000 Postural kyphosis, site unspecified: Secondary | ICD-10-CM | POA: Diagnosis not present

## 2014-11-20 DIAGNOSIS — E039 Hypothyroidism, unspecified: Secondary | ICD-10-CM | POA: Diagnosis not present

## 2014-11-20 DIAGNOSIS — F064 Anxiety disorder due to known physiological condition: Secondary | ICD-10-CM | POA: Diagnosis not present

## 2014-11-20 DIAGNOSIS — J440 Chronic obstructive pulmonary disease with acute lower respiratory infection: Secondary | ICD-10-CM | POA: Diagnosis not present

## 2014-12-07 ENCOUNTER — Encounter (HOSPITAL_COMMUNITY): Admission: RE | Admit: 2014-12-07 | Payer: Commercial Managed Care - HMO | Source: Ambulatory Visit

## 2014-12-17 ENCOUNTER — Encounter (HOSPITAL_COMMUNITY)
Admission: RE | Admit: 2014-12-17 | Discharge: 2014-12-17 | Disposition: A | Discharge status: Home / Self Care | Payer: Commercial Managed Care - HMO | Source: Ambulatory Visit | Attending: Family Medicine | Admitting: Family Medicine

## 2014-12-17 DIAGNOSIS — Z5181 Encounter for therapeutic drug level monitoring: Secondary | ICD-10-CM | POA: Insufficient documentation

## 2014-12-17 LAB — COMPREHENSIVE METABOLIC PANEL
ALBUMIN: 3.7 g/dL (ref 3.5–5.0)
ALT: 9 U/L — AB (ref 14–54)
AST: 23 U/L (ref 15–41)
Alkaline Phosphatase: 71 U/L (ref 38–126)
Anion gap: 10 (ref 5–15)
BUN: 11 mg/dL (ref 6–20)
CALCIUM: 9.5 mg/dL (ref 8.9–10.3)
CO2: 28 mmol/L (ref 22–32)
CREATININE: 0.86 mg/dL (ref 0.44–1.00)
Chloride: 101 mmol/L (ref 101–111)
GFR calc non Af Amer: >60 mL/min (ref >60)
GLUCOSE: 94 mg/dL (ref 65–99)
Potassium: 4.4 mmol/L (ref 3.5–5.1)
Sodium: 139 mmol/L (ref 135–145)
TOTAL PROTEIN: 7.6 g/dL (ref 6.5–8.1)
Total Bilirubin: 0.5 mg/dL (ref 0.3–1.2)

## 2014-12-17 MED ORDER — ZOLEDRONIC ACID 5 MG/100ML IV SOLN
INTRAVENOUS | Status: AC
  Filled 2014-12-17: qty 100

## 2014-12-17 MED ORDER — ZOLEDRONIC ACID 5 MG/100ML IV SOLN
5.0000 mg | Freq: Once | INTRAVENOUS | Status: AC
  Administered 2014-12-17: 5 mg via INTRAVENOUS

## 2014-12-17 NOTE — Progress Notes (Signed)
Results for DIANIA, CO (MRN [id number]) as of 12/17/2014 13:00  Ref. Range 12/17/2014 10:50  Sodium Latest Ref Range: 135-145 mmol/L 139  Potassium Latest Ref Range: 3.5-5.1 mmol/L 4.4  Chloride Latest Ref Range: 101-111 mmol/L 101  CO2 Latest Ref Range: 22-32 mmol/L 28  BUN Latest Ref Range: 6-20 mg/dL 11  Creatinine Latest Ref Range: 0.44-1.00 mg/dL 0.86  Calcium Latest Ref Range: 8.9-10.3 mg/dL 9.5  EGFR (Non-African Amer.) Latest Ref Range: >60 mL/min >60  EGFR (African American) Latest Ref Range: >60 mL/min >60  Glucose Latest Ref Range: 65-99 mg/dL 94  Anion gap Latest Ref Range: 5-15  10  Alkaline Phosphatase Latest Ref Range: 38-126 U/L 71  Albumin Latest Ref Range: 3.5-5.0 g/dL 3.7  AST Latest Ref Range: 15-41 U/L 23  ALT Latest Ref Range: 14-54 U/L 9 (L)  Total Protein Latest Ref Range: 6.5-8.1 g/dL 7.6  Total Bilirubin Latest Ref Range: 0.3-1.2 mg/dL 0.5

## 2015-01-19 DIAGNOSIS — E039 Hypothyroidism, unspecified: Secondary | ICD-10-CM | POA: Diagnosis not present

## 2015-01-19 DIAGNOSIS — J440 Chronic obstructive pulmonary disease with acute lower respiratory infection: Secondary | ICD-10-CM | POA: Diagnosis not present

## 2015-01-19 DIAGNOSIS — F064 Anxiety disorder due to known physiological condition: Secondary | ICD-10-CM | POA: Diagnosis not present

## 2015-01-19 DIAGNOSIS — G8929 Other chronic pain: Secondary | ICD-10-CM | POA: Diagnosis not present

## 2015-04-14 DIAGNOSIS — F064 Anxiety disorder due to known physiological condition: Secondary | ICD-10-CM | POA: Diagnosis not present

## 2015-04-14 DIAGNOSIS — R7309 Other abnormal glucose: Secondary | ICD-10-CM | POA: Diagnosis not present

## 2015-04-14 DIAGNOSIS — E038 Other specified hypothyroidism: Secondary | ICD-10-CM | POA: Diagnosis not present

## 2015-04-14 DIAGNOSIS — G8929 Other chronic pain: Secondary | ICD-10-CM | POA: Diagnosis not present

## 2015-04-14 DIAGNOSIS — M810 Age-related osteoporosis without current pathological fracture: Secondary | ICD-10-CM | POA: Diagnosis not present

## 2015-04-14 DIAGNOSIS — J440 Chronic obstructive pulmonary disease with acute lower respiratory infection: Secondary | ICD-10-CM | POA: Diagnosis not present

## 2015-04-14 DIAGNOSIS — Z681 Body mass index (BMI) 19 or less, adult: Secondary | ICD-10-CM | POA: Diagnosis not present

## 2015-04-14 DIAGNOSIS — M4000 Postural kyphosis, site unspecified: Secondary | ICD-10-CM | POA: Diagnosis not present

## 2015-07-13 DIAGNOSIS — M810 Age-related osteoporosis without current pathological fracture: Secondary | ICD-10-CM | POA: Diagnosis not present

## 2015-07-13 DIAGNOSIS — M4000 Postural kyphosis, site unspecified: Secondary | ICD-10-CM | POA: Diagnosis not present

## 2015-07-13 DIAGNOSIS — J440 Chronic obstructive pulmonary disease with acute lower respiratory infection: Secondary | ICD-10-CM | POA: Diagnosis not present

## 2015-07-13 DIAGNOSIS — E039 Hypothyroidism, unspecified: Secondary | ICD-10-CM | POA: Diagnosis not present

## 2015-08-03 DIAGNOSIS — M810 Age-related osteoporosis without current pathological fracture: Secondary | ICD-10-CM | POA: Diagnosis not present

## 2015-08-03 DIAGNOSIS — J440 Chronic obstructive pulmonary disease with acute lower respiratory infection: Secondary | ICD-10-CM | POA: Diagnosis not present

## 2015-08-03 DIAGNOSIS — M4000 Postural kyphosis, site unspecified: Secondary | ICD-10-CM | POA: Diagnosis not present

## 2015-08-03 DIAGNOSIS — F064 Anxiety disorder due to known physiological condition: Secondary | ICD-10-CM | POA: Diagnosis not present

## 2015-08-16 ENCOUNTER — Ambulatory Visit (INDEPENDENT_AMBULATORY_CARE_PROVIDER_SITE_OTHER): Payer: Commercial Managed Care - HMO

## 2015-08-16 ENCOUNTER — Ambulatory Visit (INDEPENDENT_AMBULATORY_CARE_PROVIDER_SITE_OTHER): Payer: Medicare PPO | Admitting: Orthopedic Surgery

## 2015-08-16 ENCOUNTER — Other Ambulatory Visit: Payer: Self-pay | Admitting: Orthopedic Surgery

## 2015-08-16 VITALS — BP 164/91 | Ht 65.0 in | Wt 112.0 lb

## 2015-08-16 DIAGNOSIS — M5442 Lumbago with sciatica, left side: Secondary | ICD-10-CM | POA: Diagnosis not present

## 2015-08-16 DIAGNOSIS — M25552 Pain in left hip: Secondary | ICD-10-CM

## 2015-08-16 DIAGNOSIS — Z9660 Presence of unspecified orthopedic joint implant: Secondary | ICD-10-CM

## 2015-08-16 DIAGNOSIS — Z96649 Presence of unspecified artificial hip joint: Secondary | ICD-10-CM

## 2015-08-16 MED ORDER — GABAPENTIN 100 MG PO CAPS: 100.0000 mg | ORAL_CAPSULE | Freq: Three times a day (TID) | ORAL | Status: DC

## 2015-08-16 MED ORDER — PREDNISONE 5 MG (48) PO TBPK: ORAL_TABLET | ORAL | Status: DC

## 2015-08-16 NOTE — Patient Instructions (Signed)
TWO NEW MEDICATIONS SENT TO YOUR PHARMACY

## 2015-08-16 NOTE — Progress Notes (Signed)
Chief Complaint  Patient presents with  . Hip Pain   HPI 70 year old female with a pre-fracture history of lumbar disc disease fractured her hip in 2000 had a bipolar hip replacement did well. Presents now with a 4 month history of 7 out of 10 sharp constant left lower back pain radiating into the left hip and left knee associated with weakness of the left leg and stiffness not relieved by over-the-counter creams and Advil and causing her trouble walking needing the walker to get around.  Review of Systems  Respiratory: Positive for shortness of breath.   Musculoskeletal: Positive for back pain.  Endo/Heme/Allergies: Positive for environmental allergies.  All other systems reviewed and are negative.   Past Medical History  Diagnosis Date  . Thyroid disease   . COPD (chronic obstructive pulmonary disease)   . Reflux     Past Surgical History  Procedure Laterality Date  . Joint replacement    . Total hip arthroplasty    . Fracture surgery    . Wrist surgery    . Abdominal hysterectomy     No family history on file. Social History  Substance Use Topics  . Smoking status: Former Research scientist (life sciences)  . Smokeless tobacco: Not on file  . Alcohol Use: No    Current outpatient prescriptions:  .  ALPRAZolam (XANAX) 1 MG tablet, Take 1 mg by mouth 3 (three) times daily as needed for anxiety. , Disp: , Rfl:  .  azithromycin (ZITHROMAX Z-PAK) 250 MG tablet, Take 2 tablets today and then one tablet PO daily with food., Disp: 6 tablet, Rfl: 0 .  gabapentin (NEURONTIN) 100 MG capsule, Take 1 capsule (100 mg total) by mouth 3 (three) times daily., Disp: 90 capsule, Rfl: 0 .  guaiFENesin-codeine (ROBITUSSIN AC) 100-10 MG/5ML syrup, Take 5 mLs by mouth 3 (three) times daily as needed for cough., Disp: 120 mL, Rfl: 0 .  levothyroxine (SYNTHROID, LEVOTHROID) 75 MCG tablet, Take 75 mcg by mouth daily.  , Disp: , Rfl:  .  oxyCODONE (ROXICODONE) 15 MG immediate release tablet, Take 15 mg by mouth every 4 (four)  hours as needed for pain., Disp: , Rfl:  .  predniSONE (DELTASONE) 10 MG tablet, Take 2 tablets (20 mg total) by mouth 2 (two) times daily with a meal., Disp: 16 tablet, Rfl: 0 .  predniSONE (STERAPRED UNI-PAK 48 TAB) 5 MG (48) TBPK tablet, USE AS DIRECTED, Disp: 48 tablet, Rfl: 0 .  Vitamin D, Ergocalciferol, (DRISDOL) 50000 UNITS CAPS capsule, Take 50,000 Units by mouth once a week., Disp: , Rfl:   BP 164/91 mmHg  Ht 5\' 5"  (1.651 m)  Wt 112 lb (50.803 kg)  BMI 18.64 kg/m2  Physical Exam  Constitutional: She is oriented to person, place, and time. She appears well-developed and well-nourished. No distress.  Cardiovascular: Normal rate and intact distal pulses.   Neurological: She is alert and oriented to person, place, and time. She has normal reflexes. She exhibits normal muscle tone. Coordination normal.  Skin: Skin is warm and dry. No rash noted. She is not diaphoretic. No erythema. No pallor.  Psychiatric: She has a normal mood and affect. Her behavior is normal. Judgment and thought content normal.    Left Hip Exam   Tenderness  The patient is experiencing no tenderness.     Range of Motion  Flexion: 120   Muscle Strength  Abduction: 5/5  Adduction: 5/5  Flexion: 5/5   Other  Scars: present Pulse: present   Back Exam  Tenderness  The patient is experiencing tenderness in the lumbar.  Range of Motion  Extension: abnormal  Flexion: abnormal   Muscle Strength  Right Quadriceps:  5/5  Left Quadriceps:  5/5  Right Hamstrings:  5/5  Left Hamstrings:  5/5   Reflexes  Patellar: normal Achilles: normal Babinski's sign: normal   Other  Sensation: normal Gait: antalgic  Erythema: no back redness Scars: absent       ASSESSMENT: My personal interpretation of the images:  Today's imaging studies show normal cemented left bipolar hip replacement and degenerative disc disease lumbar spine    PLAN Meds ordered this encounter  Medications  .  gabapentin (NEURONTIN) 100 MG capsule    Sig: Take 1 capsule (100 mg total) by mouth 3 (three) times daily.    Dispense:  90 capsule    Refill:  0  . predniSONE (STERAPRED UNI-PAK 48 TAB) 5 MG (48) TBPK tablet    Sig: USE AS DIRECTED    Dispense:  48 tablet    Refill:  0   Return 1 month recheck symptoms

## 2015-09-02 DIAGNOSIS — G8929 Other chronic pain: Secondary | ICD-10-CM | POA: Diagnosis not present

## 2015-09-02 DIAGNOSIS — M810 Age-related osteoporosis without current pathological fracture: Secondary | ICD-10-CM | POA: Diagnosis not present

## 2015-09-02 DIAGNOSIS — J440 Chronic obstructive pulmonary disease with acute lower respiratory infection: Secondary | ICD-10-CM | POA: Diagnosis not present

## 2015-09-02 DIAGNOSIS — F064 Anxiety disorder due to known physiological condition: Secondary | ICD-10-CM | POA: Diagnosis not present

## 2015-09-14 ENCOUNTER — Telehealth: Payer: Self-pay | Admitting: Internal Medicine

## 2015-09-14 NOTE — Telephone Encounter (Signed)
RECALL FOR TCS °

## 2015-09-15 ENCOUNTER — Other Ambulatory Visit (HOSPITAL_COMMUNITY): Payer: Self-pay | Admitting: Family Medicine

## 2015-09-15 DIAGNOSIS — Z1231 Encounter for screening mammogram for malignant neoplasm of breast: Secondary | ICD-10-CM

## 2015-09-20 ENCOUNTER — Encounter: Payer: Self-pay | Admitting: Orthopedic Surgery

## 2015-09-20 ENCOUNTER — Ambulatory Visit (INDEPENDENT_AMBULATORY_CARE_PROVIDER_SITE_OTHER): Payer: Commercial Managed Care - HMO | Admitting: Orthopedic Surgery

## 2015-09-20 VITALS — BP 148/78 | HR 98 | Ht 65.0 in | Wt 112.0 lb

## 2015-09-20 DIAGNOSIS — M5442 Lumbago with sciatica, left side: Secondary | ICD-10-CM | POA: Diagnosis not present

## 2015-09-20 DIAGNOSIS — M5136 Other intervertebral disc degeneration, lumbar region: Secondary | ICD-10-CM

## 2015-09-20 MED ORDER — GABAPENTIN 300 MG PO CAPS: 300.0000 mg | ORAL_CAPSULE | Freq: Every day | ORAL | Status: DC

## 2015-09-20 MED ORDER — METHYLPREDNISOLONE ACETATE 40 MG/ML IJ SUSP
40.0000 mg | Freq: Once | INTRAMUSCULAR | Status: AC
  Administered 2015-09-20: 40 mg via INTRAMUSCULAR

## 2015-09-20 NOTE — Telephone Encounter (Signed)
Letter mailed

## 2015-09-20 NOTE — Progress Notes (Signed)
The patient is 70 years old she has a history of lumbar disc disease she fractured her hip in 2000 bipolar replacement did well presented in March with a 4 month history of 7 out of 10 sharp lower back pain radiating to the left hip and knee with weakness in the left leg. Her pain was not relieved by Advil.  Diagnosed with degenerative disc disease her cemented bipolar was stable  He put her on a prednisone Dosepak 100 mg of gabapentin 3 times a day and most of her pain has resolved  Pertinent review of systems she has normal bowel and bladder function  BP 148/78 mmHg  Pulse 98  Ht 5\' 5"  (1.651 m)  Wt 112 lb (50.803 kg)  BMI 18.64 kg/m2 Physical Exam  Constitutional: She is oriented to person, place, and time. She appears well-developed and well-nourished. No distress.  Cardiovascular: Intact distal pulses.   Musculoskeletal:  She's emanating with a cane for support  Back today is nontender hip range of motion bilaterally is normal and she has normal strength in both legs  Neurological: She is alert and oriented to person, place, and time. She has normal reflexes.  Straight leg raises are normal  Skin: She is not diaphoretic.    Degenerative disc disease L-spine  Continue gabapentin 300 mg daily  Follow-up as needed  Meds ordered this encounter  Medications  . gabapentin (NEURONTIN) 300 MG capsule    Sig: Take 1 capsule (300 mg total) by mouth daily.    Dispense:  30 capsule    Refill:  5  .

## 2015-09-23 ENCOUNTER — Ambulatory Visit (HOSPITAL_COMMUNITY)
Admission: RE | Admit: 2015-09-23 | Discharge: 2015-09-23 | Disposition: A | Discharge status: Home / Self Care | Payer: Commercial Managed Care - HMO | Source: Ambulatory Visit | Attending: Family Medicine | Admitting: Family Medicine

## 2015-09-23 DIAGNOSIS — Z1231 Encounter for screening mammogram for malignant neoplasm of breast: Secondary | ICD-10-CM | POA: Insufficient documentation

## 2015-09-27 ENCOUNTER — Other Ambulatory Visit: Payer: Self-pay | Admitting: Family Medicine

## 2015-09-27 DIAGNOSIS — R928 Other abnormal and inconclusive findings on diagnostic imaging of breast: Secondary | ICD-10-CM

## 2015-10-04 ENCOUNTER — Other Ambulatory Visit (HOSPITAL_COMMUNITY): Payer: Self-pay | Admitting: Family Medicine

## 2015-10-04 DIAGNOSIS — R928 Other abnormal and inconclusive findings on diagnostic imaging of breast: Secondary | ICD-10-CM

## 2015-10-06 ENCOUNTER — Telehealth: Payer: Self-pay

## 2015-10-06 NOTE — Telephone Encounter (Signed)
Pt called to be triaged. She received a letter to call. [id number]

## 2015-10-11 ENCOUNTER — Telehealth: Payer: Self-pay

## 2015-10-11 NOTE — Telephone Encounter (Signed)
Pt is scheduled for an OV with AS on 10/28/2015 at 9:00 AM.

## 2015-10-11 NOTE — Telephone Encounter (Signed)
OV with AS on 10/28/2015 at 9:00 Am due to blood in stool and meds.

## 2015-10-11 NOTE — Telephone Encounter (Signed)
Pt called to be triaged. Please call her at [phone].

## 2015-10-12 ENCOUNTER — Ambulatory Visit (HOSPITAL_COMMUNITY)
Admission: RE | Admit: 2015-10-12 | Discharge: 2015-10-12 | Disposition: A | Discharge status: Home / Self Care | Payer: Commercial Managed Care - HMO | Source: Ambulatory Visit | Attending: Family Medicine | Admitting: Family Medicine

## 2015-10-12 DIAGNOSIS — R928 Other abnormal and inconclusive findings on diagnostic imaging of breast: Secondary | ICD-10-CM

## 2015-10-12 DIAGNOSIS — N63 Unspecified lump in breast: Secondary | ICD-10-CM | POA: Insufficient documentation

## 2015-10-12 NOTE — Telephone Encounter (Signed)
See previous note

## 2015-10-28 ENCOUNTER — Other Ambulatory Visit: Payer: Self-pay

## 2015-10-28 ENCOUNTER — Ambulatory Visit (INDEPENDENT_AMBULATORY_CARE_PROVIDER_SITE_OTHER): Payer: Commercial Managed Care - HMO | Admitting: Gastroenterology

## 2015-10-28 ENCOUNTER — Encounter: Payer: Self-pay | Admitting: Gastroenterology

## 2015-10-28 VITALS — BP 165/104 | HR 105 | Temp 98.1°F | Ht 65.0 in | Wt 114.4 lb

## 2015-10-28 DIAGNOSIS — Z1211 Encounter for screening for malignant neoplasm of colon: Secondary | ICD-10-CM

## 2015-10-28 MED ORDER — PEG 3350-KCL-NA BICARB-NACL 420 G PO SOLR: 4000.0000 mL | Freq: Once | ORAL | Status: DC

## 2015-10-28 NOTE — Assessment & Plan Note (Signed)
70 year old female with need for routine screening colonoscopy. Reportedly heme positive after completing hemoccult through insurance. No overt GI bleeding. No other concerning lower or upper GI signs/symptoms.   Proceed with TCS with Dr. Gala Romney in near future: the risks, benefits, and alternatives have been discussed with the patient in detail. The patient states understanding and desires to proceed. PROPOFOL due to polypharmacy

## 2015-10-28 NOTE — Patient Instructions (Signed)
We have scheduled you for a colonoscopy with Dr. Rourk in the near future.  Further recommendations to follow!   

## 2015-10-28 NOTE — Progress Notes (Signed)
cc'ed to pcp °

## 2015-10-28 NOTE — Progress Notes (Signed)
  Primary Care Physician:  BLAND,VEITA J, MD Primary Gastroenterologist:  Dr. Rourk   Chief Complaint  Patient presents with  . Colonoscopy    HPI:   Marilyn Solomon is a 70 y.o. female presenting today to schedule a routine screening colonoscopy. She states she completed a stool kit per [employer] and was told she was heme positive. Last colonoscopy in 2007 normal. Brought in today due to medications.   No overt GI bleeding. Nuts and ice cream cause some discomfort like "constipation pains". Stays away from foods that bother her. No dysphagia. No N/V. No unexplained weight loss or lack of appetite.   Past Medical History  Diagnosis Date  . Thyroid disease   . COPD (chronic obstructive pulmonary disease) (HCC)   . Reflux     no medications currently, asymptomatic  . Chronic back pain   . Anxiety     Past Surgical History  Procedure Laterality Date  . Joint replacement    . Total hip arthroplasty    . Fracture surgery    . Wrist surgery    . Abdominal hysterectomy    . Colonoscopy  2007    Dr. Rourk: normal rectum, diverticula   . Cataracts      Current Outpatient Prescriptions  Medication Sig Dispense Refill  . ALPRAZolam (XANAX) 1 MG tablet Take 1 mg by mouth 3 (three) times daily as needed for anxiety.     . gabapentin (NEURONTIN) 300 MG capsule Take 1 capsule (300 mg total) by mouth daily. 30 capsule 5  . levothyroxine (SYNTHROID, LEVOTHROID) 75 MCG tablet Take 75 mcg by mouth daily.      . oxyCODONE (ROXICODONE) 15 MG immediate release tablet Take 15 mg by mouth every 4 (four) hours as needed for pain.    . Vitamin D, Ergocalciferol, (DRISDOL) 50000 UNITS CAPS capsule Take 50,000 Units by mouth once a week.     No current facility-administered medications for this visit.    Allergies as of 10/28/2015 - Review Complete 10/28/2015  Allergen Reaction Noted  . Penicillins Itching 12/05/2013  . Vicodin [hydrocodone-acetaminophen] Nausea And Vomiting 04/17/2011     Family History  Problem Relation Age of Onset  . Colon cancer Neg Hx   . Bone cancer Sister   . Pancreatic cancer Sister     Social History   Social History  . Marital Status: Unknown    Spouse Name: N/A  . Number of Children: N/A  . Years of Education: N/A   Occupational History  . disability    Social History Main Topics  . Smoking status: Former Smoker  . Smokeless tobacco: Never Used     Comment: quit in 2000  . Alcohol Use: No  . Drug Use: No  . Sexual Activity: Yes    Birth Control/ Protection: Surgical   Other Topics Concern  . Not on file   Social History Narrative    Review of Systems: Gen: Denies any fever, chills, fatigue, weight loss, lack of appetite.  CV: Denies chest pain, heart palpitations, peripheral edema, syncope.  Resp: Denies shortness of breath at rest or with exertion. Denies wheezing or cough.  GI: see HPI  GU : Denies urinary burning, urinary frequency, urinary hesitancy MS: +joint pain  Derm: Denies rash, itching, dry skin Psych: +stress Heme: Denies bruising, bleeding, and enlarged lymph nodes.  Physical Exam: BP 165/104 mmHg  Pulse 105  Temp(Src) 98.1 F (36.7 C) (Oral)  Ht 5' 5" (1.651 m)  Wt 114 lb   6.4 oz (51.891 kg)  BMI 19.04 kg/m2 General:   Alert and oriented. Pleasant and cooperative. Well-nourished and well-developed.  Head:  Normocephalic and atraumatic. Eyes:  Without icterus, sclera clear and conjunctiva pink.  Ears:  Normal auditory acuity. Nose:  No deformity, discharge,  or lesions. Mouth:  No deformity or lesions, oral mucosa pink.  Lungs:  Mild expiratory wheeze posteriorly  Heart:  S1, S2 present without murmurs appreciated.  Abdomen:  +BS, soft, non-tender and non-distended. No HSM noted. No guarding or rebound. No masses appreciated.  Rectal:  Deferred  Msk:  Symmetrical without gross deformities. Normal posture. Extremities:  Without edema. Neurologic:  Alert and  oriented x4;  grossly normal  neurologically. Psych:  Alert and cooperative. Normal mood and affect.    

## 2015-11-05 NOTE — Patient Instructions (Signed)
Marilyn Solomon  11/05/2015     @PREFPERIOPPHARMACY @   Your procedure is scheduled on 11/15/2015.  Report to [facility] at 6:45 A.M.  Call this number if you have problems the morning of surgery:  [phone]   Remember:  Do not eat food or drink liquids after midnight.  Take these medicines the morning of surgery with A SIP OF WATER : XANAX, NEURONTIN,SYNTHROID AND OXYCODONE   Do not wear jewelry, make-up or nail polish.  Do not wear lotions, powders, or perfumes.  You may wear deodorant.  Do not shave 48 hours prior to surgery.  Men may shave face and neck.  Do not bring valuables to the hospital.  [facility] is not responsible for any belongings or valuables.  Contacts, dentures or bridgework may not be worn into surgery.  Leave your suitcase in the car.  After surgery it may be brought to your room.  For patients admitted to the hospital, discharge time will be determined by your treatment team.  Patients discharged the day of surgery will not be allowed to drive home.   Name and phone number of your driver:   FAMILY Special instructions:  N/A  Please read over the following fact sheets that you were given. Care and Recovery After Surgery    General Anesthesia, Adult General anesthesia is a sleep-like state of non-feeling produced by medicines (anesthetics). General anesthesia prevents you from being alert and feeling pain during a medical procedure. Your caregiver may recommend general anesthesia if your procedure:  Is long.  Is painful or uncomfortable.  Would be frightening to see or hear.  Requires you to be still.  Affects your breathing.  Causes significant blood loss. LET YOUR CAREGIVER KNOW ABOUT:  Allergies to food or medicine.  Medicines taken, including vitamins, herbs, eyedrops, over-the-counter medicines, and creams.  Use of steroids (by mouth or creams).  Previous problems with anesthetics or numbing medicines, including problems  experienced by relatives.  History of bleeding problems or blood clots.  Previous surgeries and types of anesthetics received.  Possibility of pregnancy, if this applies.  Use of cigarettes, alcohol, or illegal drugs.  Any health condition(s), especially diabetes, sleep apnea, and high blood pressure. RISKS AND COMPLICATIONS General anesthesia rarely causes complications. However, if complications do occur, they can be life threatening. Complications include:  A lung infection.  A stroke.  A heart attack.  Waking up during the procedure. When this occurs, the patient may be unable to move and communicate that he or she is awake. The patient may feel severe pain. Older adults and adults with serious medical problems are more likely to have complications than adults who are young and healthy. Some complications can be prevented by answering all of your caregiver's questions thoroughly and by following all pre-procedure instructions. It is important to tell your caregiver if any of the pre-procedure instructions, especially those related to diet, were not followed. Any food or liquid in the stomach can cause problems when you are under general anesthesia. BEFORE THE PROCEDURE  Ask your caregiver if you will have to spend the night at the hospital. If you will not have to spend the night, arrange to have an adult drive you and stay with you for 24 hours.  Follow your caregiver's instructions if you are taking dietary supplements or medicines. Your caregiver may tell you to stop taking them or to reduce your dosage.  Do not smoke for as long as possible before your procedure. If  possible, stop smoking 3-6 weeks before the procedure.  Do not take new dietary supplements or medicines within 1 week of your procedure unless your caregiver approves them.  Do not eat within 8 hours of your procedure or as directed by your caregiver. Drink only clear liquids, such as water, black coffee (without  milk or cream), and fruit juices (without pulp).  Do not drink within 3 hours of your procedure or as directed by your caregiver.  You may brush your teeth on the morning of the procedure, but make sure to spit out the toothpaste and water when finished. PROCEDURE  You will receive anesthetics through a mask, through an intravenous (IV) access tube, or through both. A doctor who specializes in anesthesia (anesthesiologist) or a nurse who specializes in anesthesia (nurse anesthetist) or both will stay with you throughout the procedure to make sure you remain unconscious. He or she will also watch your blood pressure, pulse, and oxygen levels to make sure that the anesthetics do not cause any problems. Once you are asleep, a breathing tube or mask may be used to help you breathe. AFTER THE PROCEDURE You will wake up after the procedure is complete. You may be in the room where the procedure was performed or in a recovery area. You may have a sore throat if a breathing tube was used. You may also feel:  Dizzy.  Weak.  Drowsy.  Confused.  Nauseous.  Cold. These are all normal responses and can be expected to last for up to 24 hours after the procedure is complete. A caregiver will tell you when you are ready to go home. This will usually be when you are fully awake and in stable condition.   This information is not intended to replace advice given to you by your health care provider. Make sure you discuss any questions you have with your health care provider.   Document Released: 08/22/2007 Document Revised: 06/05/2014 Document Reviewed: 09/13/2011 Elsevier Interactive Patient Education [employer]. Colonoscopy A colonoscopy is an exam to look at the entire large intestine (colon). This exam can help find problems such as tumors, polyps, inflammation, and areas of bleeding. The exam takes about 1 hour.  LET [facility] CARE PROVIDER KNOW ABOUT:   Any allergies you have.  All  medicines you are taking, including vitamins, herbs, eye drops, creams, and over-the-counter medicines.  Previous problems you or members of your family have had with the use of anesthetics.  Any blood disorders you have.  Previous surgeries you have had.  Medical conditions you have. RISKS AND COMPLICATIONS  Generally, this is a safe procedure. However, as with any procedure, complications can occur. Possible complications include:  Bleeding.  Tearing or rupture of the colon wall.  Reaction to medicines given during the exam.  Infection (rare). BEFORE THE PROCEDURE   Ask your health care provider about changing or stopping your regular medicines.  You may be prescribed an oral bowel prep. This involves drinking a large amount of medicated liquid, starting the day before your procedure. The liquid will cause you to have multiple loose stools until your stool is almost clear or light green. This cleans out your colon in preparation for the procedure.  Do not eat or drink anything else once you have started the bowel prep, unless your health care provider tells you it is safe to do so.  Arrange for someone to drive you home after the procedure. PROCEDURE   You will be given medicine  to help you relax (sedative).  You will lie on your side with your knees bent.  A long, flexible tube with a light and camera on the end (colonoscope) will be inserted through the rectum and into the colon. The camera sends video back to a computer screen as it moves through the colon. The colonoscope also releases carbon dioxide gas to inflate the colon. This helps your health care provider see the area better.  During the exam, your health care provider may take a small tissue sample (biopsy) to be examined under a microscope if any abnormalities are found.  The exam is finished when the entire colon has been viewed. AFTER THE PROCEDURE   Do not drive for 24 hours after the exam.  You may have a  small amount of blood in your stool.  You may pass moderate amounts of gas and have mild abdominal cramping or bloating. This is caused by the gas used to inflate your colon during the exam.  Ask when your test results will be ready and how you will get your results. Make sure you get your test results.   This information is not intended to replace advice given to you by your health care provider. Make sure you discuss any questions you have with your health care provider.   Document Released: 05/12/2000 Document Revised: 03/05/2013 Document Reviewed: 01/20/2013 Elsevier Interactive Patient Education [employer].

## 2015-11-09 ENCOUNTER — Encounter (HOSPITAL_COMMUNITY)
Admission: RE | Admit: 2015-11-09 | Discharge: 2015-11-09 | Disposition: A | Discharge status: Home / Self Care | Payer: Commercial Managed Care - HMO | Source: Ambulatory Visit | Attending: Internal Medicine | Admitting: Internal Medicine

## 2015-11-09 ENCOUNTER — Other Ambulatory Visit: Payer: Self-pay

## 2015-11-09 ENCOUNTER — Encounter (HOSPITAL_COMMUNITY): Payer: Self-pay

## 2015-11-09 DIAGNOSIS — K5900 Constipation, unspecified: Secondary | ICD-10-CM | POA: Diagnosis not present

## 2015-11-09 DIAGNOSIS — Z01812 Encounter for preprocedural laboratory examination: Secondary | ICD-10-CM | POA: Insufficient documentation

## 2015-11-09 DIAGNOSIS — Z01810 Encounter for preprocedural cardiovascular examination: Secondary | ICD-10-CM | POA: Insufficient documentation

## 2015-11-09 HISTORY — DX: Gastro-esophageal reflux disease without esophagitis: K21.9

## 2015-11-09 HISTORY — DX: Hypothyroidism, unspecified: E03.9

## 2015-11-09 HISTORY — DX: Unspecified osteoarthritis, unspecified site: M19.90

## 2015-11-09 LAB — CBC WITH DIFFERENTIAL/PLATELET
BASOS PCT: 0 %
Basophils Absolute: 0 10*3/uL (ref 0.0–0.1)
EOS ABS: 0.2 10*3/uL (ref 0.0–0.7)
EOS PCT: 2 %
HCT: 36.4 % (ref 36.0–46.0)
HEMOGLOBIN: 11.3 g/dL — AB (ref 12.0–15.0)
Lymphocytes Relative: 20 %
Lymphs Abs: 1.3 10*3/uL (ref 0.7–4.0)
MCH: 25.9 pg — ABNORMAL LOW (ref 26.0–34.0)
MCHC: 31 g/dL (ref 30.0–36.0)
MCV: 83.5 fL (ref 78.0–100.0)
Monocytes Absolute: 0.7 10*3/uL (ref 0.1–1.0)
Monocytes Relative: 10 %
NEUTROS PCT: 68 %
Neutro Abs: 4.6 10*3/uL (ref 1.7–7.7)
PLATELETS: 310 10*3/uL (ref 150–400)
RBC: 4.36 MIL/uL (ref 3.87–5.11)
RDW: 14.8 % (ref 11.5–15.5)
WBC: 6.8 10*3/uL (ref 4.0–10.5)

## 2015-11-09 LAB — BASIC METABOLIC PANEL
Anion gap: 6 (ref 5–15)
BUN: 11 mg/dL (ref 6–20)
CHLORIDE: 104 mmol/L (ref 101–111)
CO2: 27 mmol/L (ref 22–32)
CREATININE: 0.82 mg/dL (ref 0.44–1.00)
Calcium: 9.4 mg/dL (ref 8.9–10.3)
Glucose, Bld: 98 mg/dL (ref 65–99)
Potassium: 4.2 mmol/L (ref 3.5–5.1)
SODIUM: 137 mmol/L (ref 135–145)

## 2015-11-15 ENCOUNTER — Encounter (HOSPITAL_COMMUNITY): Payer: Self-pay | Admitting: *Deleted

## 2015-11-15 ENCOUNTER — Ambulatory Visit (HOSPITAL_COMMUNITY)
Admission: RE | Admit: 2015-11-15 | Discharge: 2015-11-15 | Disposition: A | Discharge status: Home / Self Care | Payer: Commercial Managed Care - HMO | Source: Ambulatory Visit | Attending: Internal Medicine | Admitting: Internal Medicine

## 2015-11-15 ENCOUNTER — Ambulatory Visit (HOSPITAL_COMMUNITY): Payer: Commercial Managed Care - HMO | Admitting: Anesthesiology

## 2015-11-15 ENCOUNTER — Encounter (HOSPITAL_COMMUNITY)
Admission: RE | Disposition: A | Discharge status: Home / Self Care | Payer: Self-pay | Source: Ambulatory Visit | Attending: Internal Medicine

## 2015-11-15 DIAGNOSIS — Z96649 Presence of unspecified artificial hip joint: Secondary | ICD-10-CM | POA: Insufficient documentation

## 2015-11-15 DIAGNOSIS — R195 Other fecal abnormalities: Secondary | ICD-10-CM

## 2015-11-15 DIAGNOSIS — J449 Chronic obstructive pulmonary disease, unspecified: Secondary | ICD-10-CM | POA: Diagnosis not present

## 2015-11-15 DIAGNOSIS — Z885 Allergy status to narcotic agent status: Secondary | ICD-10-CM | POA: Diagnosis not present

## 2015-11-15 DIAGNOSIS — K5730 Diverticulosis of large intestine without perforation or abscess without bleeding: Secondary | ICD-10-CM | POA: Insufficient documentation

## 2015-11-15 DIAGNOSIS — G8929 Other chronic pain: Secondary | ICD-10-CM | POA: Insufficient documentation

## 2015-11-15 DIAGNOSIS — K641 Second degree hemorrhoids: Secondary | ICD-10-CM | POA: Insufficient documentation

## 2015-11-15 DIAGNOSIS — Z79899 Other long term (current) drug therapy: Secondary | ICD-10-CM | POA: Diagnosis not present

## 2015-11-15 DIAGNOSIS — Z880 Allergy status to penicillin: Secondary | ICD-10-CM | POA: Insufficient documentation

## 2015-11-15 DIAGNOSIS — Z87891 Personal history of nicotine dependence: Secondary | ICD-10-CM | POA: Diagnosis not present

## 2015-11-15 DIAGNOSIS — Z1211 Encounter for screening for malignant neoplasm of colon: Secondary | ICD-10-CM | POA: Diagnosis not present

## 2015-11-15 DIAGNOSIS — K219 Gastro-esophageal reflux disease without esophagitis: Secondary | ICD-10-CM | POA: Diagnosis not present

## 2015-11-15 DIAGNOSIS — F419 Anxiety disorder, unspecified: Secondary | ICD-10-CM | POA: Diagnosis not present

## 2015-11-15 HISTORY — PX: COLONOSCOPY WITH PROPOFOL: SHX5780

## 2015-11-15 SURGERY — COLONOSCOPY WITH PROPOFOL
Anesthesia: Monitor Anesthesia Care

## 2015-11-15 MED ORDER — LIDOCAINE HCL (CARDIAC) 10 MG/ML IV SOLN
INTRAVENOUS | Status: DC | PRN
  Administered 2015-11-15: 50 mg via INTRAVENOUS

## 2015-11-15 MED ORDER — MIDAZOLAM HCL 2 MG/2ML IJ SOLN
INTRAMUSCULAR | Status: AC
  Filled 2015-11-15: qty 2

## 2015-11-15 MED ORDER — PROPOFOL 10 MG/ML IV BOLUS
INTRAVENOUS | Status: AC
  Filled 2015-11-15: qty 20

## 2015-11-15 MED ORDER — PROPOFOL 10 MG/ML IV BOLUS
INTRAVENOUS | Status: DC | PRN
  Administered 2015-11-15: 10 mg via INTRAVENOUS

## 2015-11-15 MED ORDER — LIDOCAINE HCL (PF) 1 % IJ SOLN
INTRAMUSCULAR | Status: AC
  Filled 2015-11-15: qty 5

## 2015-11-15 MED ORDER — MIDAZOLAM HCL 2 MG/2ML IJ SOLN
1.0000 mg | INTRAMUSCULAR | Status: DC | PRN
Start: 2015-11-15 — End: 2015-11-15
  Administered 2015-11-15: 2 mg via INTRAVENOUS

## 2015-11-15 MED ORDER — FENTANYL CITRATE (PF) 100 MCG/2ML IJ SOLN
25.0000 ug | INTRAMUSCULAR | Status: DC | PRN
  Administered 2015-11-15: 25 ug via INTRAVENOUS

## 2015-11-15 MED ORDER — ONDANSETRON HCL 4 MG/2ML IJ SOLN: 4.0000 mg | Freq: Once | INTRAMUSCULAR | Status: DC | PRN

## 2015-11-15 MED ORDER — FENTANYL CITRATE (PF) 100 MCG/2ML IJ SOLN
INTRAMUSCULAR | Status: AC
  Filled 2015-11-15: qty 2

## 2015-11-15 MED ORDER — LACTATED RINGERS IV SOLN
INTRAVENOUS | Status: DC
  Administered 2015-11-15: 1000 mL via INTRAVENOUS

## 2015-11-15 MED ORDER — FENTANYL CITRATE (PF) 100 MCG/2ML IJ SOLN: 25.0000 ug | INTRAMUSCULAR | Status: DC | PRN

## 2015-11-15 MED ORDER — PROPOFOL 500 MG/50ML IV EMUL
INTRAVENOUS | Status: DC | PRN
Start: 2015-11-15 — End: 2015-11-15
  Administered 2015-11-15: 75 ug/kg/min via INTRAVENOUS

## 2015-11-15 NOTE — Discharge Instructions (Signed)
Colonoscopy Discharge Instructions  Read the instructions outlined below and refer to this sheet in the next few weeks. These discharge instructions provide you with general information on caring for yourself after you leave the hospital. Your doctor may also give you specific instructions. While your treatment has been planned according to the most current medical practices available, unavoidable complications occasionally occur. If you have any problems or questions after discharge, call Dr. Gala Romney at [phone]. ACTIVITY  You may resume your regular activity, but move at a slower pace for the next 24 hours.   Take frequent rest periods for the next 24 hours.   Walking will help get rid of the air and reduce the bloated feeling in your belly (abdomen).   No driving for 24 hours (because of the medicine (anesthesia) used during the test).    Do not sign any important legal documents or operate any machinery for 24 hours (because of the anesthesia used during the test).  NUTRITION  Drink plenty of fluids.   You may resume your normal diet as instructed by your doctor.   Begin with a light meal and progress to your normal diet. Heavy or fried foods are harder to digest and may make you feel sick to your stomach (nauseated).   Avoid alcoholic beverages for 24 hours or as instructed.  MEDICATIONS  You may resume your normal medications unless your doctor tells you otherwise.  WHAT YOU CAN EXPECT TODAY  Some feelings of bloating in the abdomen.   Passage of more gas than usual.   Spotting of blood in your stool or on the toilet paper.  IF YOU HAD POLYPS REMOVED DURING THE COLONOSCOPY:  No aspirin products for 7 days or as instructed.   No alcohol for 7 days or as instructed.   Eat a soft diet for the next 24 hours.  FINDING OUT THE RESULTS OF YOUR TEST Not all test results are available during your visit. If your test results are not back during the visit, make an appointment  with your caregiver to find out the results. Do not assume everything is normal if you have not heard from your caregiver or the medical facility. It is important for you to follow up on all of your test results.  SEEK IMMEDIATE MEDICAL ATTENTION IF:  You have more than a spotting of blood in your stool.   Your belly is swollen (abdominal distention).   You are nauseated or vomiting.   You have a temperature over 101.   You have abdominal pain or discomfort that is severe or gets worse throughout the day.   Hemorrhoid and diverticulosis information provided  I do not recommend a future colonoscopy unless symptoms develop Hemorrhoids Hemorrhoids are puffy (swollen) veins around the rectum or anus. Hemorrhoids can cause pain, itching, bleeding, or irritation. HOME CARE  Eat foods with fiber, such as whole grains, beans, nuts, fruits, and vegetables. Ask your doctor about taking products with added fiber in them (fibersupplements).  Drink enough fluid to keep your pee (urine) clear or pale yellow.  Exercise often.  Go to the bathroom when you have the urge to poop. Do not wait.  Avoid straining to poop (bowel movement).  Keep the butt area dry and clean. Use wet toilet paper or moist paper towels.  Medicated creams and medicine inserted into the anus (anal suppository) may be used or applied as told.  Only take medicine as told by your doctor.  Take a warm water bath (sitz  bath) for 15-20 minutes to ease pain. Do this 3-4 times a day.  Place ice packs on the area if it is tender or puffy. Use the ice packs between the warm water baths.  Put ice in a plastic bag.  Place a towel between your skin and the bag.  Leave the ice on for 15-20 minutes, 03-04 times a day.  Do not use a donut-shaped pillow or sit on the toilet for a long time. GET HELP RIGHT AWAY IF:   You have more pain that is not controlled by treatment or medicine.  You have bleeding that will not  stop.  You have trouble or are unable to poop (bowel movement).  You have pain or puffiness outside the area of the hemorrhoids. MAKE SURE YOU:   Understand these instructions.  Will watch your condition.  Will get help right away if you are not doing well or get worse.   This information is not intended to replace advice given to you by your health care provider. Make sure you discuss any questions you have with your health care provider.   Document Released: 02/22/2008 Document Revised: 05/01/2012 Document Reviewed: 03/26/2012 Elsevier Interactive Patient Education 2016 [employer]. Diverticulosis Diverticulosis is the condition that develops when small pouches (diverticula) form in the wall of your colon. Your colon, or large intestine, is where water is absorbed and stool is formed. The pouches form when the inside layer of your colon pushes through weak spots in the outer layers of your colon. CAUSES  No one knows exactly what causes diverticulosis. RISK FACTORS  Being older than 6. Your risk for this condition increases with age. Diverticulosis is rare in people younger than 40 years. By age 10, almost everyone has it.  Eating a low-fiber diet.  Being frequently constipated.  Being overweight.  Not getting enough exercise.  Smoking.  Taking over-the-counter pain medicines, like aspirin and ibuprofen. SYMPTOMS  Most people with diverticulosis do not have symptoms. DIAGNOSIS  Because diverticulosis often has no symptoms, health care providers often discover the condition during an exam for other colon problems. In many cases, a health care provider will diagnose diverticulosis while using a flexible scope to examine the colon (colonoscopy). TREATMENT  If you have never developed an infection related to diverticulosis, you may not need treatment. If you have had an infection before, treatment may include:  Eating more fruits, vegetables, and grains.  Taking a fiber  supplement.  Taking a live bacteria supplement (probiotic).  Taking medicine to relax your colon. HOME CARE INSTRUCTIONS   Drink at least 6-8 glasses of water each day to prevent constipation.  Try not to strain when you have a bowel movement.  Keep all follow-up appointments. If you have had an infection before:  Increase the fiber in your diet as directed by your health care provider or dietitian.  Take a dietary fiber supplement if your health care provider approves.  Only take medicines as directed by your health care provider. SEEK MEDICAL CARE IF:   You have abdominal pain.  You have bloating.  You have cramps.  You have not gone to the bathroom in 3 days. SEEK IMMEDIATE MEDICAL CARE IF:   Your pain gets worse.  Yourbloating becomes very bad.  You have a fever or chills, and your symptoms suddenly get worse.  You begin vomiting.  You have bowel movements that are bloody or black. MAKE SURE YOU:  Understand these instructions.  Will watch your condition.  Will  get help right away if you are not doing well or get worse.   This information is not intended to replace advice given to you by your health care provider. Make sure you discuss any questions you have with your health care provider.   Document Released: 02/10/2004 Document Revised: 05/20/2013 Document Reviewed: 04/09/2013 Elsevier Interactive Patient Education [employer].

## 2015-11-15 NOTE — Anesthesia Procedure Notes (Signed)
Procedure Name: MAC Date/Time: 11/15/2015 8:09 AM Performed by: Vista Deck Pre-anesthesia Checklist: Patient identified, Emergency Drugs available, Suction available, Timeout performed and Patient being monitored Patient Re-evaluated:Patient Re-evaluated prior to inductionOxygen Delivery Method: Non-rebreather mask

## 2015-11-15 NOTE — Anesthesia Preprocedure Evaluation (Signed)
Anesthesia Evaluation  Patient identified by MRN, date of birth, ID band Patient awake    Reviewed: Allergy & Precautions, NPO status , Patient's Chart, lab work & pertinent test results  Airway Mallampati: I  TM Distance: >3 FB     Dental  (+) Edentulous Upper, Edentulous Lower   Pulmonary COPD, former smoker,    breath sounds clear to auscultation       Cardiovascular negative cardio ROS   Rhythm:Regular Rate:Normal     Neuro/Psych PSYCHIATRIC DISORDERS Anxiety    GI/Hepatic GERD  ,  Endo/Other  Hypothyroidism   Renal/GU      Musculoskeletal   Abdominal   Peds  Hematology   Anesthesia Other Findings   Reproductive/Obstetrics                             Anesthesia Physical Anesthesia Plan  ASA: III  Anesthesia Plan: MAC   Post-op Pain Management:    Induction: Intravenous  Airway Management Planned: Simple Face Mask  Additional Equipment:   Intra-op Plan:   Post-operative Plan:   Informed Consent: I have reviewed the patients History and Physical, chart, labs and discussed the procedure including the risks, benefits and alternatives for the proposed anesthesia with the patient or authorized representative who has indicated his/her understanding and acceptance.     Plan Discussed with:   Anesthesia Plan Comments:         Anesthesia Quick Evaluation

## 2015-11-15 NOTE — Anesthesia Postprocedure Evaluation (Signed)
Anesthesia Post Note  Patient: Marilyn Solomon  Procedure(s) Performed: Procedure(s) (LRB): COLONOSCOPY WITH PROPOFOL (N/A)  Patient location during evaluation: PACU Anesthesia Type: MAC Level of consciousness: awake Pain management: satisfactory to patient Vital Signs Assessment: post-procedure vital signs reviewed and stable Respiratory status: spontaneous breathing Cardiovascular status: stable and blood pressure returned to baseline Anesthetic complications: no    Last Vitals:  Filed Vitals:   11/15/15 0800 11/15/15 0805  BP: 112/69 100/67  Pulse:    Temp:    Resp: 21 20    Last Pain:  Filed Vitals:   11/15/15 0806  PainSc: 0-No pain                 Kara Mierzejewski

## 2015-11-15 NOTE — Op Note (Addendum)
[facility] Patient Name: Marilyn Solomon Procedure Date: 11/15/2015 8:09 AM MRN: [id number] Date of Birth: 1945/06/30 Attending MD: Norvel Richards , MD CSN: [id number] Age: 70 Admit Type: Outpatient Procedure:                Colonoscopy - diagnostic Indications:              Heme positive stool Providers:                Norvel Richards, MD, Renda Rolls, RN, Isabella Stalling, Technician Referring MD:              Medicines:                Propofol total dose mg IV, Propofol per Anesthesia Complications:            No immediate complications. Estimated Blood Loss:     Estimated blood loss: none. Procedure:                Pre-Anesthesia Assessment:                           - Prior to the procedure, a History and Physical                            was performed, and patient medications and                            allergies were reviewed. The patient's tolerance of                            previous anesthesia was also reviewed. The risks                            and benefits of the procedure and the sedation                            options and risks were discussed with the patient.                            All questions were answered, and informed consent                            was obtained. Prior Anticoagulants: The patient has                            taken no previous anticoagulant or antiplatelet                            agents. ASA Grade Assessment: II - A patient with                            mild systemic disease. After reviewing the risks  and benefits, the patient was deemed in                            satisfactory condition to undergo the procedure.                           After obtaining informed consent, the colonoscope                            was passed under direct vision. Throughout the                            procedure, the patient's blood pressure, pulse, and                             oxygen saturations were monitored continuously. The                            was introduced through the anus and advanced to the                            the cecum, identified by appendiceal orifice and                            ileocecal valve. The colonoscopy was performed                            without difficulty. The patient tolerated the                            procedure well. The quality of the bowel                            preparation was adequate. The ileocecal valve,                            appendiceal orifice, and rectum were photographed. Scope In: 8:19:37 AM Scope Out: 8:41:19 AM Scope Withdrawal Time: 0 hours 8 minutes 48 seconds  Total Procedure Duration: 0 hours 21 minutes 42 seconds  Findings:      The perianal exam findings include internal hemorrhoids that prolapse       with straining, but spontaneously regress to the resting position (Grade       II).      Non-bleeding internal hemorrhoids were found. The hemorrhoids were Grade       II (internal hemorrhoids that prolapse but reduce spontaneously).      Multiple small-mouthed diverticula were found in the sigmoid colon and       descending colon.      The exam was otherwise without abnormality on direct and retroflexion       views. Impression:               - Internal hemorrhoids that prolapse with                            straining, but spontaneously  regress to the resting                            position (Grade II) found on perianal exam.                           - Non-bleeding internal hemorrhoids.                           - Diverticulosis in the sigmoid colon and in the                            descending colon.                           - The examination was otherwise normal on direct                            and retroflexion views.                           - No specimens collected. Hemoccult-positive stool                            likely secondary to  hemorrhoids. Moderate Sedation:      Moderate (conscious) sedation was personally administered by an       anesthesia professional. The following parameters were monitored: oxygen       saturation, heart rate, blood pressure, respiratory rate, EKG, adequacy       of pulmonary ventilation, and response to care. Total physician       intraservice time was 18 minutes. Recommendation:           - Patient has a contact number available for                            emergencies. The signs and symptoms of potential                            delayed complications were discussed with the                            patient. Return to normal activities tomorrow.                            Written discharge instructions were provided to the                            patient.                           - Advance diet as tolerated.                           - Continue present medications.                           - No  repeat colonoscopy due to age unless new                            symptoms develop.                           - Return to GI clinic PRN. Procedure Code(s):        --- Professional ---                           [phone], Colonoscopy, flexible; diagnostic, including                            collection of specimen(s) by brushing or washing,                            when performed (separate procedure) Diagnosis Code(s):        --- Professional ---                           K64.1, Second degree hemorrhoids                           R19.5, Other fecal abnormalities                           K57.30, Diverticulosis of large intestine without                            perforation or abscess without bleeding CPT copyright 2016 American Medical Association. All rights reserved. The codes documented in this report are preliminary and upon coder review may  be revised to meet current compliance requirements. Cristopher Estimable. Rourk, MD Norvel Richards, MD 11/15/2015 8:49:24 AM This report has  been signed electronically. Number of Addenda: 0

## 2015-11-15 NOTE — H&P (View-Only) (Signed)
Primary Care Physician:  Elyn Peers, MD Primary Gastroenterologist:  Dr. Gala Romney   Chief Complaint  Patient presents with  . Colonoscopy    HPI:   Marilyn Solomon is a 70 y.o. female presenting today to schedule a routine screening colonoscopy. She states she completed a stool kit per [facility] and was told she was heme positive. Last colonoscopy in 2007 normal. Brought in today due to medications.   No overt GI bleeding. Nuts and ice cream cause some discomfort like "constipation pains". Stays away from foods that bother her. No dysphagia. No N/V. No unexplained weight loss or lack of appetite.   Past Medical History  Diagnosis Date  . Thyroid disease   . COPD (chronic obstructive pulmonary disease) ([place])   . Reflux     no medications currently, asymptomatic  . Chronic back pain   . Anxiety     Past Surgical History  Procedure Laterality Date  . Joint replacement    . Total hip arthroplasty    . Fracture surgery    . Wrist surgery    . Abdominal hysterectomy    . Colonoscopy  2007    Dr. Gala Romney: normal rectum, diverticula   . Cataracts      Current Outpatient Prescriptions  Medication Sig Dispense Refill  . ALPRAZolam (XANAX) 1 MG tablet Take 1 mg by mouth 3 (three) times daily as needed for anxiety.     . gabapentin (NEURONTIN) 300 MG capsule Take 1 capsule (300 mg total) by mouth daily. 30 capsule 5  . levothyroxine (SYNTHROID, LEVOTHROID) 75 MCG tablet Take 75 mcg by mouth daily.      Marland Kitchen oxyCODONE (ROXICODONE) 15 MG immediate release tablet Take 15 mg by mouth every 4 (four) hours as needed for pain.    . Vitamin D, Ergocalciferol, (DRISDOL) 50000 UNITS CAPS capsule Take 50,000 Units by mouth once a week.     No current facility-administered medications for this visit.    Allergies as of 10/28/2015 - Review Complete 10/28/2015  Allergen Reaction Noted  . Penicillins Itching 12/05/2013  . Vicodin [hydrocodone-acetaminophen] Nausea And Vomiting 04/17/2011     Family History  Problem Relation Age of Onset  . Colon cancer Neg Hx   . Bone cancer Sister   . Pancreatic cancer Sister     Social History   Social History  . Marital Status: Unknown    Spouse Name: N/A  . Number of Children: N/A  . Years of Education: N/A   Occupational History  . disability    Social History Main Topics  . Smoking status: Former Research scientist (life sciences)  . Smokeless tobacco: Never Used     Comment: quit in 2000  . Alcohol Use: No  . Drug Use: No  . Sexual Activity: Yes    Birth Control/ Protection: Surgical   Other Topics Concern  . Not on file   Social History Narrative    Review of Systems: Gen: Denies any fever, chills, fatigue, weight loss, lack of appetite.  CV: Denies chest pain, heart palpitations, peripheral edema, syncope.  Resp: Denies shortness of breath at rest or with exertion. Denies wheezing or cough.  GI: see HPI  GU : Denies urinary burning, urinary frequency, urinary hesitancy MS: +joint pain  Derm: Denies rash, itching, dry skin Psych: +stress Heme: Denies bruising, bleeding, and enlarged lymph nodes.  Physical Exam: BP 165/104 mmHg  Pulse 105  Temp(Src) 98.1 F (36.7 C) (Oral)  Ht _0  (1.651 m)  Wt 114 lb  6.4 oz (51.891 kg)  BMI 19.04 kg/m2 General:   Alert and oriented. Pleasant and cooperative. Well-nourished and well-developed.  Head:  Normocephalic and atraumatic. Eyes:  Without icterus, sclera clear and conjunctiva pink.  Ears:  Normal auditory acuity. Nose:  No deformity, discharge,  or lesions. Mouth:  No deformity or lesions, oral mucosa pink.  Lungs:  Mild expiratory wheeze posteriorly  Heart:  S1, S2 present without murmurs appreciated.  Abdomen:  +BS, soft, non-tender and non-distended. No HSM noted. No guarding or rebound. No masses appreciated.  Rectal:  Deferred  Msk:  Symmetrical without gross deformities. Normal posture. Extremities:  Without edema. Neurologic:  Alert and  oriented x4;  grossly normal  neurologically. Psych:  Alert and cooperative. Normal mood and affect.

## 2015-11-15 NOTE — Transfer of Care (Signed)
Immediate Anesthesia Transfer of Care Note  Patient: Marilyn Solomon  Procedure(s) Performed: Procedure(s) with comments: COLONOSCOPY WITH PROPOFOL (N/A) - 0815  Patient Location: PACU  Anesthesia Type:MAC  Level of Consciousness: awake and patient cooperative  Airway & Oxygen Therapy: Patient Spontanous Breathing and non-rebreather face mask  Post-op Assessment: Report given to RN, Post -op Vital signs reviewed and stable and Patient moving all extremities  Post vital signs: Reviewed and stable    Last Pain:  Filed Vitals:   11/15/15 0806  PainSc: 0-No pain      Patients Stated Pain Goal: 5 ([id number])  Complications: No apparent anesthesia complications

## 2015-11-15 NOTE — Interval H&P Note (Signed)
History and Physical Interval Note:  11/15/2015 8:04 AM  Marilyn Solomon  has presented today for surgery, with the diagnosis of screening  The various methods of treatment have been discussed with the patient and family. After consideration of risks, benefits and other options for treatment, the patient has consented to  Procedure(s) with comments: COLONOSCOPY WITH PROPOFOL (N/A) - 0815 as a surgical intervention .  The patient's history has been reviewed, patient examined, no change in status, stable for surgery.  I have reviewed the patient's chart and labs.  Questions were answered to the patient's satisfaction.     Robert Rourk  No change. Hemoccult-positive stool. Here for diagnostic colonoscopy.  The risks, benefits, limitations, alternatives and imponderables have been reviewed with the patient. Questions have been answered. All parties are agreeable.

## 2015-11-17 ENCOUNTER — Encounter (HOSPITAL_COMMUNITY): Payer: Self-pay | Admitting: Internal Medicine

## 2015-12-01 DIAGNOSIS — E039 Hypothyroidism, unspecified: Secondary | ICD-10-CM | POA: Diagnosis not present

## 2015-12-01 DIAGNOSIS — M810 Age-related osteoporosis without current pathological fracture: Secondary | ICD-10-CM | POA: Diagnosis not present

## 2015-12-01 DIAGNOSIS — J440 Chronic obstructive pulmonary disease with acute lower respiratory infection: Secondary | ICD-10-CM | POA: Diagnosis not present

## 2015-12-01 DIAGNOSIS — G8929 Other chronic pain: Secondary | ICD-10-CM | POA: Diagnosis not present

## 2015-12-17 ENCOUNTER — Other Ambulatory Visit (HOSPITAL_COMMUNITY): Payer: Medicare PPO

## 2015-12-17 ENCOUNTER — Encounter (HOSPITAL_COMMUNITY)
Admission: RE | Admit: 2015-12-17 | Discharge: 2015-12-17 | Disposition: A | Discharge status: Home / Self Care | Payer: Commercial Managed Care - HMO | Source: Ambulatory Visit | Attending: Family Medicine | Admitting: Family Medicine

## 2015-12-17 DIAGNOSIS — M810 Age-related osteoporosis without current pathological fracture: Secondary | ICD-10-CM | POA: Diagnosis not present

## 2015-12-17 MED ORDER — SODIUM CHLORIDE 0.9 % IV SOLN
INTRAVENOUS | Status: DC
  Administered 2015-12-17: 09:00:00 via INTRAVENOUS

## 2015-12-17 MED ORDER — ZOLEDRONIC ACID 5 MG/100ML IV SOLN
5.0000 mg | Freq: Once | INTRAVENOUS | Status: AC
Start: 2015-12-17 — End: 2015-12-17
  Administered 2015-12-17: 5 mg via INTRAVENOUS
  Filled 2015-12-17: qty 100

## 2015-12-17 NOTE — Discharge Instructions (Signed)

## 2015-12-30 DIAGNOSIS — Z780 Asymptomatic menopausal state: Secondary | ICD-10-CM | POA: Diagnosis not present

## 2015-12-30 DIAGNOSIS — M8589 Other specified disorders of bone density and structure, multiple sites: Secondary | ICD-10-CM | POA: Diagnosis not present

## 2016-02-15 DIAGNOSIS — F064 Anxiety disorder due to known physiological condition: Secondary | ICD-10-CM | POA: Diagnosis not present

## 2016-02-15 DIAGNOSIS — M4000 Postural kyphosis, site unspecified: Secondary | ICD-10-CM | POA: Diagnosis not present

## 2016-02-15 DIAGNOSIS — G8929 Other chronic pain: Secondary | ICD-10-CM | POA: Diagnosis not present

## 2016-02-15 DIAGNOSIS — E038 Other specified hypothyroidism: Secondary | ICD-10-CM | POA: Diagnosis not present

## 2016-03-01 DIAGNOSIS — G8929 Other chronic pain: Secondary | ICD-10-CM | POA: Diagnosis not present

## 2016-03-01 DIAGNOSIS — J441 Chronic obstructive pulmonary disease with (acute) exacerbation: Secondary | ICD-10-CM | POA: Diagnosis not present

## 2016-03-01 DIAGNOSIS — E038 Other specified hypothyroidism: Secondary | ICD-10-CM | POA: Diagnosis not present

## 2016-03-01 DIAGNOSIS — M4000 Postural kyphosis, site unspecified: Secondary | ICD-10-CM | POA: Diagnosis not present

## 2016-05-03 DIAGNOSIS — F064 Anxiety disorder due to known physiological condition: Secondary | ICD-10-CM | POA: Diagnosis not present

## 2016-05-03 DIAGNOSIS — J440 Chronic obstructive pulmonary disease with acute lower respiratory infection: Secondary | ICD-10-CM | POA: Diagnosis not present

## 2016-05-03 DIAGNOSIS — M810 Age-related osteoporosis without current pathological fracture: Secondary | ICD-10-CM | POA: Diagnosis not present

## 2016-05-03 DIAGNOSIS — R030 Elevated blood-pressure reading, without diagnosis of hypertension: Secondary | ICD-10-CM | POA: Diagnosis not present

## 2016-09-01 DIAGNOSIS — M810 Age-related osteoporosis without current pathological fracture: Secondary | ICD-10-CM | POA: Diagnosis not present

## 2016-09-01 DIAGNOSIS — E039 Hypothyroidism, unspecified: Secondary | ICD-10-CM | POA: Diagnosis not present

## 2016-09-01 DIAGNOSIS — J441 Chronic obstructive pulmonary disease with (acute) exacerbation: Secondary | ICD-10-CM | POA: Diagnosis not present

## 2016-09-01 DIAGNOSIS — G8929 Other chronic pain: Secondary | ICD-10-CM | POA: Diagnosis not present

## 2016-09-01 DIAGNOSIS — M4000 Postural kyphosis, site unspecified: Secondary | ICD-10-CM | POA: Diagnosis not present

## 2016-10-17 ENCOUNTER — Other Ambulatory Visit (HOSPITAL_COMMUNITY): Payer: Self-pay | Admitting: Family Medicine

## 2016-10-17 DIAGNOSIS — Z1231 Encounter for screening mammogram for malignant neoplasm of breast: Secondary | ICD-10-CM

## 2016-10-27 ENCOUNTER — Ambulatory Visit (HOSPITAL_COMMUNITY)
Admission: RE | Admit: 2016-10-27 | Discharge: 2016-10-27 | Disposition: A | Discharge status: Home / Self Care | Payer: Medicare HMO | Source: Ambulatory Visit | Attending: Family Medicine | Admitting: Family Medicine

## 2016-10-27 DIAGNOSIS — Z1231 Encounter for screening mammogram for malignant neoplasm of breast: Secondary | ICD-10-CM | POA: Insufficient documentation

## 2016-12-01 DIAGNOSIS — G8929 Other chronic pain: Secondary | ICD-10-CM | POA: Diagnosis not present

## 2016-12-01 DIAGNOSIS — J441 Chronic obstructive pulmonary disease with (acute) exacerbation: Secondary | ICD-10-CM | POA: Diagnosis not present

## 2016-12-01 DIAGNOSIS — M810 Age-related osteoporosis without current pathological fracture: Secondary | ICD-10-CM | POA: Diagnosis not present

## 2016-12-01 DIAGNOSIS — R946 Abnormal results of thyroid function studies: Secondary | ICD-10-CM | POA: Diagnosis not present

## 2016-12-01 DIAGNOSIS — E038 Other specified hypothyroidism: Secondary | ICD-10-CM | POA: Diagnosis not present

## 2016-12-18 ENCOUNTER — Inpatient Hospital Stay (HOSPITAL_COMMUNITY): Admission: RE | Admit: 2016-12-18 | Payer: Commercial Managed Care - HMO | Source: Ambulatory Visit

## 2017-03-02 DIAGNOSIS — G8929 Other chronic pain: Secondary | ICD-10-CM | POA: Diagnosis not present

## 2017-03-02 DIAGNOSIS — R030 Elevated blood-pressure reading, without diagnosis of hypertension: Secondary | ICD-10-CM | POA: Diagnosis not present

## 2017-03-02 DIAGNOSIS — F064 Anxiety disorder due to known physiological condition: Secondary | ICD-10-CM | POA: Diagnosis not present

## 2017-03-02 DIAGNOSIS — J440 Chronic obstructive pulmonary disease with acute lower respiratory infection: Secondary | ICD-10-CM | POA: Diagnosis not present

## 2017-03-02 DIAGNOSIS — E038 Other specified hypothyroidism: Secondary | ICD-10-CM | POA: Diagnosis not present

## 2017-03-02 DIAGNOSIS — M810 Age-related osteoporosis without current pathological fracture: Secondary | ICD-10-CM | POA: Diagnosis not present

## 2017-03-02 DIAGNOSIS — Z23 Encounter for immunization: Secondary | ICD-10-CM | POA: Diagnosis not present

## 2017-06-01 DIAGNOSIS — J441 Chronic obstructive pulmonary disease with (acute) exacerbation: Secondary | ICD-10-CM | POA: Diagnosis not present

## 2017-06-01 DIAGNOSIS — D6489 Other specified anemias: Secondary | ICD-10-CM | POA: Diagnosis not present

## 2017-06-01 DIAGNOSIS — F064 Anxiety disorder due to known physiological condition: Secondary | ICD-10-CM | POA: Diagnosis not present

## 2017-06-01 DIAGNOSIS — E039 Hypothyroidism, unspecified: Secondary | ICD-10-CM | POA: Diagnosis not present

## 2017-06-12 DIAGNOSIS — Z1211 Encounter for screening for malignant neoplasm of colon: Secondary | ICD-10-CM | POA: Diagnosis not present

## 2017-06-12 DIAGNOSIS — Z1212 Encounter for screening for malignant neoplasm of rectum: Secondary | ICD-10-CM | POA: Diagnosis not present

## 2017-06-13 ENCOUNTER — Encounter: Payer: Self-pay | Admitting: Gastroenterology

## 2017-06-13 LAB — COLOGUARD

## 2017-06-28 ENCOUNTER — Emergency Department (HOSPITAL_COMMUNITY): Payer: Medicare HMO

## 2017-06-28 ENCOUNTER — Encounter (HOSPITAL_COMMUNITY): Payer: Self-pay | Admitting: Emergency Medicine

## 2017-06-28 ENCOUNTER — Other Ambulatory Visit: Payer: Self-pay

## 2017-06-28 ENCOUNTER — Inpatient Hospital Stay (HOSPITAL_COMMUNITY)
Admission: EM | Admit: 2017-06-28 | Discharge: 2017-07-01 | DRG: 194 | Disposition: A | Discharge status: Home / Self Care | Payer: Medicare HMO | Attending: Family Medicine | Admitting: Family Medicine

## 2017-06-28 DIAGNOSIS — Z96649 Presence of unspecified artificial hip joint: Secondary | ICD-10-CM | POA: Diagnosis present

## 2017-06-28 DIAGNOSIS — A419 Sepsis, unspecified organism: Secondary | ICD-10-CM

## 2017-06-28 DIAGNOSIS — R000 Tachycardia, unspecified: Secondary | ICD-10-CM | POA: Diagnosis present

## 2017-06-28 DIAGNOSIS — Z79890 Hormone replacement therapy (postmenopausal): Secondary | ICD-10-CM

## 2017-06-28 DIAGNOSIS — E559 Vitamin D deficiency, unspecified: Secondary | ICD-10-CM | POA: Diagnosis present

## 2017-06-28 DIAGNOSIS — F419 Anxiety disorder, unspecified: Secondary | ICD-10-CM | POA: Diagnosis present

## 2017-06-28 DIAGNOSIS — I959 Hypotension, unspecified: Secondary | ICD-10-CM | POA: Diagnosis not present

## 2017-06-28 DIAGNOSIS — J440 Chronic obstructive pulmonary disease with acute lower respiratory infection: Secondary | ICD-10-CM | POA: Diagnosis present

## 2017-06-28 DIAGNOSIS — D509 Iron deficiency anemia, unspecified: Secondary | ICD-10-CM | POA: Diagnosis present

## 2017-06-28 DIAGNOSIS — Z90710 Acquired absence of both cervix and uterus: Secondary | ICD-10-CM

## 2017-06-28 DIAGNOSIS — D649 Anemia, unspecified: Secondary | ICD-10-CM

## 2017-06-28 DIAGNOSIS — I2699 Other pulmonary embolism without acute cor pulmonale: Secondary | ICD-10-CM | POA: Diagnosis not present

## 2017-06-28 DIAGNOSIS — N179 Acute kidney failure, unspecified: Secondary | ICD-10-CM | POA: Diagnosis not present

## 2017-06-28 DIAGNOSIS — J449 Chronic obstructive pulmonary disease, unspecified: Secondary | ICD-10-CM | POA: Diagnosis not present

## 2017-06-28 DIAGNOSIS — N289 Disorder of kidney and ureter, unspecified: Secondary | ICD-10-CM | POA: Diagnosis present

## 2017-06-28 DIAGNOSIS — R748 Abnormal levels of other serum enzymes: Secondary | ICD-10-CM

## 2017-06-28 DIAGNOSIS — K219 Gastro-esophageal reflux disease without esophagitis: Secondary | ICD-10-CM | POA: Diagnosis present

## 2017-06-28 DIAGNOSIS — J189 Pneumonia, unspecified organism: Secondary | ICD-10-CM

## 2017-06-28 DIAGNOSIS — Z79899 Other long term (current) drug therapy: Secondary | ICD-10-CM

## 2017-06-28 DIAGNOSIS — E039 Hypothyroidism, unspecified: Secondary | ICD-10-CM | POA: Diagnosis present

## 2017-06-28 DIAGNOSIS — Z23 Encounter for immunization: Secondary | ICD-10-CM

## 2017-06-28 DIAGNOSIS — R0902 Hypoxemia: Secondary | ICD-10-CM

## 2017-06-28 DIAGNOSIS — Z880 Allergy status to penicillin: Secondary | ICD-10-CM

## 2017-06-28 DIAGNOSIS — J441 Chronic obstructive pulmonary disease with (acute) exacerbation: Secondary | ICD-10-CM | POA: Diagnosis present

## 2017-06-28 DIAGNOSIS — Z885 Allergy status to narcotic agent status: Secondary | ICD-10-CM | POA: Diagnosis not present

## 2017-06-28 DIAGNOSIS — R0989 Other specified symptoms and signs involving the circulatory and respiratory systems: Secondary | ICD-10-CM | POA: Diagnosis not present

## 2017-06-28 DIAGNOSIS — Z87891 Personal history of nicotine dependence: Secondary | ICD-10-CM | POA: Diagnosis not present

## 2017-06-28 DIAGNOSIS — R05 Cough: Secondary | ICD-10-CM | POA: Diagnosis not present

## 2017-06-28 DIAGNOSIS — D500 Iron deficiency anemia secondary to blood loss (chronic): Secondary | ICD-10-CM | POA: Diagnosis not present

## 2017-06-28 DIAGNOSIS — R791 Abnormal coagulation profile: Secondary | ICD-10-CM | POA: Diagnosis present

## 2017-06-28 DIAGNOSIS — R6520 Severe sepsis without septic shock: Secondary | ICD-10-CM | POA: Diagnosis not present

## 2017-06-28 HISTORY — DX: Pneumonia, unspecified organism: J18.9

## 2017-06-28 LAB — COMPREHENSIVE METABOLIC PANEL
ALK PHOS: 90 U/L (ref 38–126)
ALT: 11 U/L — ABNORMAL LOW (ref 14–54)
ANION GAP: 10 (ref 5–15)
AST: 20 U/L (ref 15–41)
Albumin: 3.1 g/dL — ABNORMAL LOW (ref 3.5–5.0)
BILIRUBIN TOTAL: 0.4 mg/dL (ref 0.3–1.2)
BUN: 37 mg/dL — ABNORMAL HIGH (ref 6–20)
CALCIUM: 8.9 mg/dL (ref 8.9–10.3)
CO2: 27 mmol/L (ref 22–32)
Chloride: 99 mmol/L — ABNORMAL LOW (ref 101–111)
Creatinine, Ser: 1.32 mg/dL — ABNORMAL HIGH (ref 0.44–1.00)
GFR calc non Af Amer: 40 mL/min — ABNORMAL LOW (ref >60)
GFR, EST AFRICAN AMERICAN: 46 mL/min — AB (ref >60)
Glucose, Bld: 142 mg/dL — ABNORMAL HIGH (ref 65–99)
Potassium: 3.9 mmol/L (ref 3.5–5.1)
Sodium: 136 mmol/L (ref 135–145)
TOTAL PROTEIN: 7.8 g/dL (ref 6.5–8.1)

## 2017-06-28 LAB — CBC WITH DIFFERENTIAL/PLATELET
BASOS ABS: 0 10*3/uL (ref 0.0–0.1)
BASOS PCT: 0 %
Eosinophils Absolute: 0 10*3/uL (ref 0.0–0.7)
Eosinophils Relative: 0 %
HEMATOCRIT: 28.7 % — AB (ref 36.0–46.0)
HEMOGLOBIN: 8.2 g/dL — AB (ref 12.0–15.0)
Lymphocytes Relative: 9 %
Lymphs Abs: 1.2 10*3/uL (ref 0.7–4.0)
MCH: 22.5 pg — ABNORMAL LOW (ref 26.0–34.0)
MCHC: 28.6 g/dL — ABNORMAL LOW (ref 30.0–36.0)
MCV: 78.8 fL (ref 78.0–100.0)
Monocytes Absolute: 1.5 10*3/uL — ABNORMAL HIGH (ref 0.1–1.0)
Monocytes Relative: 11 %
NEUTROS ABS: 11.2 10*3/uL — AB (ref 1.7–7.7)
NEUTROS PCT: 80 %
Platelets: 395 10*3/uL (ref 150–400)
RBC: 3.64 MIL/uL — ABNORMAL LOW (ref 3.87–5.11)
RDW: 16.1 % — AB (ref 11.5–15.5)
WBC: 14 10*3/uL — ABNORMAL HIGH (ref 4.0–10.5)

## 2017-06-28 LAB — TROPONIN I: Troponin I: <0.03 ng/mL (ref <0.03)

## 2017-06-28 LAB — LACTIC ACID, PLASMA: LACTIC ACID, VENOUS: 0.9 mmol/L (ref 0.5–1.9)

## 2017-06-28 LAB — INFLUENZA PANEL BY PCR (TYPE A & B)
INFLBPCR: NEGATIVE
Influenza A By PCR: NEGATIVE

## 2017-06-28 MED ORDER — SODIUM CHLORIDE 0.9 % IV SOLN
INTRAVENOUS | Status: DC
  Administered 2017-06-29 – 2017-06-30 (×2): via INTRAVENOUS

## 2017-06-28 MED ORDER — ALBUTEROL (5 MG/ML) CONTINUOUS INHALATION SOLN
10.0000 mg/h | INHALATION_SOLUTION | Freq: Once | RESPIRATORY_TRACT | Status: AC
  Administered 2017-06-28: 10 mg/h via RESPIRATORY_TRACT
  Filled 2017-06-28: qty 20

## 2017-06-28 MED ORDER — IPRATROPIUM BROMIDE 0.02 % IN SOLN
1.0000 mg | Freq: Once | RESPIRATORY_TRACT | Status: AC
  Administered 2017-06-28: 1 mg via RESPIRATORY_TRACT
  Filled 2017-06-28: qty 5

## 2017-06-28 MED ORDER — LEVOFLOXACIN IN D5W 750 MG/150ML IV SOLN: 750.0000 mg | INTRAVENOUS | Status: DC

## 2017-06-28 MED ORDER — ENOXAPARIN SODIUM 40 MG/0.4ML ~~LOC~~ SOLN
40.0000 mg | SUBCUTANEOUS | Status: DC
  Administered 2017-06-29 – 2017-07-01 (×3): 40 mg via SUBCUTANEOUS
  Filled 2017-06-28 (×3): qty 0.4

## 2017-06-28 MED ORDER — VANCOMYCIN HCL 500 MG IV SOLR
500.0000 mg | INTRAVENOUS | Status: DC
  Administered 2017-06-29: 500 mg via INTRAVENOUS
  Filled 2017-06-28 (×3): qty 500

## 2017-06-28 MED ORDER — ALBUTEROL SULFATE (2.5 MG/3ML) 0.083% IN NEBU: 2.5000 mg | INHALATION_SOLUTION | Freq: Four times a day (QID) | RESPIRATORY_TRACT | Status: DC | PRN

## 2017-06-28 MED ORDER — OXYCODONE HCL 5 MG PO TABS: 15.0000 mg | ORAL_TABLET | ORAL | Status: DC | PRN

## 2017-06-28 MED ORDER — VITAMIN D (ERGOCALCIFEROL) 1.25 MG (50000 UNIT) PO CAPS
50000.0000 [IU] | ORAL_CAPSULE | ORAL | Status: DC
  Filled 2017-06-28: qty 1

## 2017-06-28 MED ORDER — SODIUM CHLORIDE 0.9 % IV BOLUS (SEPSIS)
500.0000 mL | Freq: Once | INTRAVENOUS | Status: AC
  Administered 2017-06-28: 500 mL via INTRAVENOUS

## 2017-06-28 MED ORDER — VANCOMYCIN HCL IN DEXTROSE 1-5 GM/200ML-% IV SOLN
1000.0000 mg | Freq: Once | INTRAVENOUS | Status: AC
  Administered 2017-06-28: 1000 mg via INTRAVENOUS
  Filled 2017-06-28: qty 200

## 2017-06-28 MED ORDER — LEVOFLOXACIN IN D5W 750 MG/150ML IV SOLN
750.0000 mg | INTRAVENOUS | Status: DC
  Administered 2017-06-30: 750 mg via INTRAVENOUS
  Filled 2017-06-28: qty 150

## 2017-06-28 MED ORDER — PEG 3350-KCL-NA BICARB-NACL 420 G PO SOLR
4000.0000 mL | Freq: Once | ORAL | Status: DC
  Filled 2017-06-28: qty 4000

## 2017-06-28 MED ORDER — LEVOTHYROXINE SODIUM 75 MCG PO TABS
75.0000 ug | ORAL_TABLET | Freq: Every day | ORAL | Status: DC
  Administered 2017-06-29 – 2017-07-01 (×3): 75 ug via ORAL
  Filled 2017-06-28 (×3): qty 1

## 2017-06-28 MED ORDER — GABAPENTIN 300 MG PO CAPS
300.0000 mg | ORAL_CAPSULE | Freq: Every day | ORAL | Status: DC
  Administered 2017-06-29 – 2017-06-30 (×2): 300 mg via ORAL
  Filled 2017-06-28 (×3): qty 1

## 2017-06-28 MED ORDER — SODIUM CHLORIDE 0.9 % IV SOLN
1000.0000 mL | INTRAVENOUS | Status: DC
  Administered 2017-06-28: 1000 mL via INTRAVENOUS

## 2017-06-28 MED ORDER — LEVOFLOXACIN IN D5W 750 MG/150ML IV SOLN
750.0000 mg | Freq: Once | INTRAVENOUS | Status: AC
  Administered 2017-06-28: 750 mg via INTRAVENOUS
  Filled 2017-06-28: qty 150

## 2017-06-28 MED ORDER — ACETAMINOPHEN 325 MG PO TABS
650.0000 mg | ORAL_TABLET | Freq: Once | ORAL | Status: AC
  Administered 2017-06-28: 650 mg via ORAL
  Filled 2017-06-28: qty 2

## 2017-06-28 MED ORDER — PNEUMOCOCCAL VAC POLYVALENT 25 MCG/0.5ML IJ INJ
0.5000 mL | INJECTION | INTRAMUSCULAR | Status: AC
  Administered 2017-06-29: 0.5 mL via INTRAMUSCULAR
  Filled 2017-06-28: qty 0.5

## 2017-06-28 MED ORDER — ALPRAZOLAM 1 MG PO TABS
1.0000 mg | ORAL_TABLET | Freq: Three times a day (TID) | ORAL | Status: DC | PRN
  Filled 2017-06-28: qty 2

## 2017-06-28 NOTE — ED Provider Notes (Signed)
[facility] EMERGENCY DEPARTMENT Provider Note   CSN: [id number] Arrival date & time: 06/28/17  1706     History   Chief Complaint Chief Complaint  Patient presents with  . Cough    HPI Marilyn Solomon is a 72 y.o. female.  HPI  Pt was seen at [place].  Per pt, c/o gradual onset and worsening of persistent cough for the past 1 week.  Describes her cough as productive of "thick yellow" sputum. Has been associated with home fevers to "101."  Denies CP/palpitations, no back pain, no abd pain, no N/V/D, no rash.    Past Medical History:  Diagnosis Date  . Anxiety   . Arthritis   . Chronic back pain   . COPD (chronic obstructive pulmonary disease) ([place])   . GERD (gastroesophageal reflux disease)   . Hypothyroidism   . Reflux    no medications currently, asymptomatic  . Thyroid disease     Patient Active Problem List   Diagnosis Date Noted  . Second degree hemorrhoids   . Encounter for screening colonoscopy 10/28/2015    Past Surgical History:  Procedure Laterality Date  . ABDOMINAL HYSTERECTOMY    . cataracts    . COLONOSCOPY  2007   Dr. Gala Romney: normal rectum, diverticula   . COLONOSCOPY WITH PROPOFOL N/A 11/15/2015   Procedure: COLONOSCOPY WITH PROPOFOL;  Surgeon: Daneil Dolin, MD;  Location: [facility] SUITE;  Service: Endoscopy;  Laterality: N/A;  5176  . FRACTURE SURGERY Right 2005   Wrist  . JOINT REPLACEMENT  2000  . TOTAL HIP ARTHROPLASTY    . WRIST SURGERY      OB History    No data available       Home Medications    Prior to Admission medications   Medication Sig Start Date End Date Taking? Authorizing Provider  ALPRAZolam Duanne Moron) 1 MG tablet Take 1 mg by mouth 3 (three) times daily as needed for anxiety.  08/10/14   [provider]  Cholecalciferol (VITAMIN D PO) Take 1 tablet by mouth daily.    [provider]  gabapentin (NEURONTIN) 300 MG capsule Take 1 capsule (300 mg total) by mouth daily. 09/20/15   Carole Civil, MD    levothyroxine (SYNTHROID, LEVOTHROID) 75 MCG tablet Take 75 mcg by mouth daily.      [provider]  oxyCODONE (ROXICODONE) 15 MG immediate release tablet Take 15 mg by mouth every 4 (four) hours as needed for pain.    [provider]  polyethylene glycol-electrolytes (NULYTELY/GOLYTELY) 420 g solution Take 4,000 mLs by mouth once. 10/28/15   Annitta Needs, NP  Vitamin D, Ergocalciferol, (DRISDOL) 50000 UNITS CAPS capsule Take 50,000 Units by mouth once a week. 07/31/14   [provider]    Family History Family History  Problem Relation Age of Onset  . Bone cancer Sister   . Pancreatic cancer Sister   . Pneumonia Mother   . Heart attack Father   . Diabetes Sister   . Dementia Sister   . Colon cancer Neg Hx     Social History Social History   Tobacco Use  . Smoking status: Former Smoker    Packs/day: 25.00    Years: 1.00    Pack years: 25.00    Types: Cigarettes    Last attempt to quit: 08/27/1998    Years since quitting: 18.8  . Smokeless tobacco: Never Used  . Tobacco comment: quit in 2000  Substance Use Topics  . Alcohol  use: No    Alcohol/week: 0.0 oz  . Drug use: No     Allergies   Penicillins and Vicodin [hydrocodone-acetaminophen]   Review of Systems Review of Systems ROS: Statement: All systems negative except as marked or noted in the HPI; Constitutional: +fever and chills. ; ; Eyes: Negative for eye pain, redness and discharge. ; ; ENMT: Negative for ear pain, hoarseness, nasal congestion, sinus pressure and sore throat. ; ; Cardiovascular: Negative for chest pain, palpitations, diaphoresis, dyspnea and peripheral edema. ; ; Respiratory: +cough. Negative for wheezing and stridor. ; ; Gastrointestinal: Negative for nausea, vomiting, diarrhea, abdominal pain, blood in stool, hematemesis, jaundice and rectal bleeding. . ; ; Genitourinary: Negative for dysuria, flank pain and hematuria. ; ; Musculoskeletal: Negative for back pain and neck  pain. Negative for swelling and trauma.; ; Skin: Negative for pruritus, rash, abrasions, blisters, bruising and skin lesion.; ; Neuro: Negative for headache, lightheadedness and neck stiffness. Negative for weakness, altered level of consciousness, altered mental status, extremity weakness, paresthesias, involuntary movement, seizure and syncope.       Physical Exam Updated Vital Signs BP 139/83 (BP Location: Left Arm)   Pulse (!) 120   Temp (!) 100.5 F (38.1 C) (Rectal)   Resp (!) 22   Ht 5\' 5"  (1.651 m)   Wt 53.1 kg (117 lb)   SpO2 98%   BMI 19.47 kg/m    Patient Vitals for the past 24 hrs:  BP Temp Temp src Pulse Resp SpO2 Height Weight  06/28/17 2045 - 98.1 F (36.7 C) Oral - - - - -  06/28/17 2045 - - - (!) 107 (!) 21 98 % - -  06/28/17 2030 (!) 98/46 - - (!) 104 (!) 28 99 % - -  06/28/17 2015 - - - (!) 109 (!) 25 99 % - -  06/28/17 2000 (!) 96/48 - - (!) 115 (!) 23 97 % - -  06/28/17 1930 (!) 120/54 - - (!) 130 (!) 22 99 % - -  06/28/17 1915 - - - (!) 111 (!) 26 100 % - -  06/28/17 1900 127/63 - - 99 (!) 23 100 % - -  06/28/17 1856 - - - - - 98 % - -  06/28/17 1842 - (!) 100.5 F (38.1 C) Rectal - - - - -  06/28/17 1718 139/83 100.1 F (37.8 C) Oral (!) 120 (!) 22 (!) 85 % 5\' 5"  (1.651 m) 53.1 kg (117 lb)     Physical Exam 1845: Physical examination:  Nursing notes reviewed; Vital signs and O2 SAT reviewed;  Constitutional: Well developed, Well nourished, Well hydrated, Uncomfortable appearing.;; Head:  Normocephalic, atraumatic; Eyes: EOMI, PERRL, No scleral icterus; ENMT: Mouth and pharynx normal, Mucous membranes moist; Neck: Supple, Full range of motion, No lymphadenopathy; Cardiovascular: Tachycardic rate and rhythm, No gallop; Respiratory: Breath sounds coarse & equal bilaterally, insp/exp wheezes bilat. No audible wheezing. Frequent moist cough during exam. Speaking short sentences, tachypneic.; Chest: Nontender, Movement normal; Abdomen: Soft, Nontender,  Nondistended, Normal bowel sounds; Genitourinary: No CVA tenderness; Extremities: Pulses normal, No tenderness, No edema, No calf edema or asymmetry.; Neuro: AA&Ox3, Major CN grossly intact.  Speech clear. No gross focal motor or sensory deficits in extremities.; Skin: Color normal, Warm, Dry.    ED Treatments / Results  Labs (all labs ordered are listed, but only abnormal results are displayed)   EKG  EKG Interpretation  Date/Time:  Thursday June 28 2017 18:49:06 EST Ventricular Rate:  103 PR Interval:  QRS Duration: 87 QT Interval:  323 QTC Calculation: 423 R Axis:   80 Text Interpretation:  Sinus tachycardia Baseline wander Artifact When compared with ECG of 11/09/2015 Rate faster Confirmed by Francine Graven [phone]) on 06/28/2017 6:54:39 PM       Radiology   Procedures Procedures (including critical care time)  Medications Ordered in ED Medications  0.9 %  sodium chloride infusion (1,000 mLs Intravenous New Bag/Given 06/28/17 1854)  levofloxacin (LEVAQUIN) IVPB 750 mg (750 mg Intravenous New Bag/Given 06/28/17 1901)  vancomycin (VANCOCIN) IVPB 1000 mg/200 mL premix (not administered)  albuterol (PROVENTIL,VENTOLIN) solution continuous neb (10 mg/hr Nebulization Given 06/28/17 1856)  ipratropium (ATROVENT) nebulizer solution 1 mg (1 mg Nebulization Given 06/28/17 1856)  acetaminophen (TYLENOL) tablet 650 mg (650 mg Oral Given 06/28/17 1901)     Initial Impression / Assessment and Plan / ED Course  I have reviewed the triage vital signs and the nursing notes.  Pertinent labs & imaging results that were available during my care of the patient were reviewed by me and considered in my medical decision making (see chart for details).  MDM Reviewed: previous chart, nursing note and vitals Reviewed previous: labs and ECG Interpretation: labs, ECG and x-ray Total time providing critical care: 30-74 minutes. This excludes time spent performing separately reportable  procedures and services. Consults: admitting MD   CRITICAL CARE Performed by: Alfonzo Feller Total critical care time: 35 minutes Critical care time was exclusive of separately billable procedures and treating other patients. Critical care was necessary to treat or prevent imminent or life-threatening deterioration. Critical care was time spent personally by me on the following activities: development of treatment plan with patient and/or surrogate as well as nursing, discussions with consultants, evaluation of patient's response to treatment, examination of patient, obtaining history from patient or surrogate, ordering and performing treatments and interventions, ordering and review of laboratory studies, ordering and review of radiographic studies, pulse oximetry and re-evaluation of patient's condition.   Results for orders placed or performed during the hospital encounter of 06/28/17  Blood Culture (routine x 2)  Result Value Ref Range   Specimen Description BLOOD RIGHT HAND    Special Requests      BOTTLES DRAWN AEROBIC AND ANAEROBIC Blood Culture adequate volume   Culture PENDING    Report Status PENDING   Blood Culture (routine x 2)  Result Value Ref Range   Specimen Description LEFT ANTECUBITAL    Special Requests      BOTTLES DRAWN AEROBIC AND ANAEROBIC Blood Culture adequate volume   Culture PENDING    Report Status PENDING   Comprehensive metabolic panel  Result Value Ref Range   Sodium 136 135 - 145 mmol/L   Potassium 3.9 3.5 - 5.1 mmol/L   Chloride 99 (L) 101 - 111 mmol/L   CO2 27 22 - 32 mmol/L   Glucose, Bld 142 (H) 65 - 99 mg/dL   BUN 37 (H) 6 - 20 mg/dL   Creatinine, Ser 1.32 (H) 0.44 - 1.00 mg/dL   Calcium 8.9 8.9 - 10.3 mg/dL   Total Protein 7.8 6.5 - 8.1 g/dL   Albumin 3.1 (L) 3.5 - 5.0 g/dL   AST 20 15 - 41 U/L   ALT 11 (L) 14 - 54 U/L   Alkaline Phosphatase 90 38 - 126 U/L   Total Bilirubin 0.4 0.3 - 1.2 mg/dL   GFR calc non Af Amer 40 (L) >60  mL/min   GFR calc Af Amer 46 (L) >60 mL/min  Anion gap 10 5 - 15  CBC WITH DIFFERENTIAL  Result Value Ref Range   WBC 14.0 (H) 4.0 - 10.5 K/uL   RBC 3.64 (L) 3.87 - 5.11 MIL/uL   Hemoglobin 8.2 (L) 12.0 - 15.0 g/dL   HCT 28.7 (L) 36.0 - 46.0 %   MCV 78.8 78.0 - 100.0 fL   MCH 22.5 (L) 26.0 - 34.0 pg   MCHC 28.6 (L) 30.0 - 36.0 g/dL   RDW 16.1 (H) 11.5 - 15.5 %   Platelets 395 150 - 400 K/uL   Neutrophils Relative % 80 %   Neutro Abs 11.2 (H) 1.7 - 7.7 K/uL   Lymphocytes Relative 9 %   Lymphs Abs 1.2 0.7 - 4.0 K/uL   Monocytes Relative 11 %   Monocytes Absolute 1.5 (H) 0.1 - 1.0 K/uL   Eosinophils Relative 0 %   Eosinophils Absolute 0.0 0.0 - 0.7 K/uL   Basophils Relative 0 %   Basophils Absolute 0.0 0.0 - 0.1 K/uL  Troponin I  Result Value Ref Range   Troponin I <0.03 <0.03 ng/mL  Lactic acid, plasma  Result Value Ref Range   Lactic Acid, Venous 0.9 0.5 - 1.9 mmol/L    Dg Chest 2 View Result Date: 06/28/2017 CLINICAL DATA:  Productive cough 5 days. EXAM: CHEST  2 VIEW COMPARISON:  08/29/2014, 04/14/2012 and chest CT 04/17/2012 FINDINGS: Lungs are adequately inflated with chronic bilateral coarse interstitial changes. There is superimposed patchy hazy airspace opacification over the lung bases which may be due to acute infection superimposed on chronic interstitial disease. No significant effusion. Cardiomediastinal silhouette and remainder the exam is unchanged. IMPRESSION: Patchy airspace process over the lung bases likely acute infection on background of chronic interstitial disease. Electronically Signed   By: Marin Olp M.D.   On: 06/28/2017 18:05    1845:  Code Sepsis called on arrival. APAP given for fever. IV abx started after [facility] and UC obtained. Hypoxic on arrival, Sats increased into 90's with O2 N/C; hour long neb ordered. BP stable.   2110: SBP 90's c/w previous in Epic. O2 Sats improved after neb and on O2 N/C. H/H lower than previous; will need to be trended. Pt  denies black/blood in stools; abd benign on exam. Dx and testing d/w pt.  Questions answered.  Verb understanding, agreeable to admit.  T/C to [facility] Dr. Maudie Mercury, case discussed, including:  HPI, pertinent PM/SHx, VS/PE, dx testing, ED course and treatment:  Agreeable to admit.       Final Clinical Impressions(s) / ED Diagnoses   Final diagnoses:  None    ED Discharge Orders    None       Francine Graven, DO 06/30/17 1130

## 2017-06-28 NOTE — ED Notes (Addendum)
Day shift secretary called put on hold. I called back at 1907. RN Theadora Rama made aware that patient is a Code Sepsis

## 2017-06-28 NOTE — H&P (Addendum)
TRH H&P   Patient Demographics:    Marilyn Solomon, is a 72 y.o. female  MRN: [id number]   DOB - 06-20-1945  Admit Date - 06/28/2017  Outpatient Primary MD for the patient is Lucianne Lei, MD  Referring MD/NP/PA:   Thurnell Garbe  Outpatient Specialists:   Patient coming from: home  Chief Complaint  Patient presents with  . Cough      HPI:    Marilyn Solomon  is a 72 y.o. female, w hypothyroidism, Copd, apparently presents with subjective fever tonight and cough w yellow sputum,  Pt presented due to fever.     In ED, Temp 100.5 CXR IMPRESSION: Patchy airspace process over the lung bases likely acute infection on background of chronic interstitial disease.  Na 136, K 3.9, Bun 37, Creatinine 1.32 Ast 20, Alt 11 Alb 3.1  Wbc 14.0, Hgb 8.2, Plt 395 Trop <0.03 LA 0.9  Pt will be admitted for pneumonia.     Review of systems:    In addition to the HPI above,  No Headache, No changes with Vision or hearing, No problems swallowing food or Liquids, No Chest pain, No Abdominal pain, No Nausea or Vommitting, Bowel movements are regular, No Blood in stool or Urine, No dysuria, No new skin rashes or bruises, No new joints pains-aches,  No new weakness, tingling, numbness in any extremity, No recent weight gain or loss, No polyuria, polydypsia or polyphagia, No significant Mental Stressors.  A full 10 point Review of Systems was done, except as stated above, all other Review of Systems were negative.   With Past History of the following :    Past Medical History:  Diagnosis Date  . Anxiety   . Arthritis   . Chronic back pain   . COPD (chronic obstructive pulmonary disease) ([place])   . GERD (gastroesophageal reflux disease)   . Hypothyroidism   . Reflux    no medications currently, asymptomatic  . Thyroid disease       Past Surgical History:  Procedure  Laterality Date  . ABDOMINAL HYSTERECTOMY    . cataracts    . COLONOSCOPY  2007   Dr. Gala Romney: normal rectum, diverticula   . COLONOSCOPY WITH PROPOFOL N/A 11/15/2015   Procedure: COLONOSCOPY WITH PROPOFOL;  Surgeon: Daneil Dolin, MD;  Location: [facility];  Service: Endoscopy;  Laterality: N/A;  5003  . FRACTURE SURGERY Right 2005   Wrist  . JOINT REPLACEMENT  2000  . TOTAL HIP ARTHROPLASTY    . WRIST SURGERY        Social History:     Social History   Tobacco Use  . Smoking status: Former Smoker    Packs/day: 25.00    Years: 1.00    Pack years: 25.00    Types: Cigarettes    Last attempt to quit: 08/27/1998    Years since quitting: 18.8  . Smokeless  tobacco: Never Used  . Tobacco comment: quit in 2000  Substance Use Topics  . Alcohol use: No    Alcohol/week: 0.0 oz     Lives - at home  Mobility - walks by self   Family History :     Family History  Problem Relation Age of Onset  . Bone cancer Sister   . Pancreatic cancer Sister   . Pneumonia Mother   . Heart attack Father   . Diabetes Sister   . Dementia Sister   . Colon cancer Neg Hx       Home Medications:   Prior to Admission medications   Medication Sig Start Date End Date Taking? Authorizing Provider  ALPRAZolam Duanne Moron) 1 MG tablet Take 1 mg by mouth 3 (three) times daily as needed for anxiety.  08/10/14   [provider]  Cholecalciferol (VITAMIN D PO) Take 1 tablet by mouth daily.    [provider]  gabapentin (NEURONTIN) 300 MG capsule Take 1 capsule (300 mg total) by mouth daily. 09/20/15   Carole Civil, MD  levothyroxine (SYNTHROID, LEVOTHROID) 75 MCG tablet Take 75 mcg by mouth daily.      [provider]  oxyCODONE (ROXICODONE) 15 MG immediate release tablet Take 15 mg by mouth every 4 (four) hours as needed for pain.    [provider]  polyethylene glycol-electrolytes (NULYTELY/GOLYTELY) 420 g solution Take 4,000 mLs by mouth once. 10/28/15   Annitta Needs, NP  Vitamin D, Ergocalciferol, (DRISDOL) 50000 UNITS CAPS capsule Take 50,000 Units by mouth once a week. 07/31/14   [provider]     Allergies:     Allergies  Allergen Reactions  . Penicillins Itching    Has patient had a PCN reaction causing immediate rash, facial/tongue/throat swelling, SOB or lightheadedness with hypotension: no Has patient had a PCN reaction causing severe rash involving mucus membranes or skin necrosis: No Has patient had a PCN reaction that required hospitalization No Has patient had a PCN reaction occurring within the last 10 years: No If all of the above answers are "NO", then may proceed with Cephalosporin use.   . Vicodin [Hydrocodone-Acetaminophen] Nausea And Vomiting     Physical Exam:   Vitals  Blood pressure (!) 98/46, pulse (!) 107, temperature 98.1 F (36.7 C), temperature source Oral, resp. rate (!) 21, height 5\' 5"  (1.651 m), weight 53.1 kg (117 lb), SpO2 98 %.   1. General  lying in bed in NAD,    2. Normal affect and insight, Not Suicidal or Homicidal, Awake Alert, Oriented X 3.  3. No F.N deficits, ALL C.Nerves Intact, Strength 5/5 all 4 extremities, Sensation intact all 4 extremities, Plantars down going.  4. Ears and Eyes appear Normal, Conjunctivae clear, PERRLA. Moist Oral Mucosa.  5. Supple Neck, No JVD, No cervical lymphadenopathy appriciated, No Carotid Bruits.  6. Symmetrical Chest wall movement, Good air movement bilaterally, crackles bilateral base, no wheezing  7. RRR, No Gallops, Rubs or Murmurs, No Parasternal Heave.  8. Positive Bowel Sounds, Abdomen Soft, No tenderness, No organomegaly appriciated,No rebound -guarding or rigidity.  9.  No Cyanosis, Normal Skin Turgor, No Skin Rash or Bruise.  10. Good muscle tone,  joints appear normal , no effusions, Normal ROM.  11. No Palpable Lymph Nodes in Neck or Axillae     Data Review:    CBC Recent Labs  Lab 06/28/17 1843  WBC 14.0*  HGB 8.2*    HCT 28.7*  PLT 395  MCV 78.8  MCH 22.5*  MCHC 28.6*  RDW 16.1*  LYMPHSABS 1.2  MONOABS 1.5*  EOSABS 0.0  BASOSABS 0.0   ------------------------------------------------------------------------------------------------------------------  Chemistries  Recent Labs  Lab 06/28/17 1843  NA 136  K 3.9  CL 99*  CO2 27  GLUCOSE 142*  BUN 37*  CREATININE 1.32*  CALCIUM 8.9  AST 20  ALT 11*  ALKPHOS 90  BILITOT 0.4   ------------------------------------------------------------------------------------------------------------------ estimated creatinine clearance is 33.2 mL/min (A) (by C-G formula based on SCr of 1.32 mg/dL (H)). ------------------------------------------------------------------------------------------------------------------ No results for input(s): TSH, T4TOTAL, T3FREE, THYROIDAB in the last 72 hours.  Invalid input(s): FREET3  Coagulation profile No results for input(s): INR, PROTIME in the last 168 hours. ------------------------------------------------------------------------------------------------------------------- No results for input(s): DDIMER in the last 72 hours. -------------------------------------------------------------------------------------------------------------------  Cardiac Enzymes Recent Labs  Lab 06/28/17 1843  TROPONINI <0.03   ------------------------------------------------------------------------------------------------------------------ No results found for: BNP   ---------------------------------------------------------------------------------------------------------------  Urinalysis    Component Value Date/Time   COLORURINE YELLOW 03/25/2008 [place] 03/25/2008 1702   LABSPEC >1.030 (H) 03/25/2008 1702   PHURINE 5.0 03/25/2008 1702   GLUCOSEU NEGATIVE 03/25/2008 1702   HGBUR NEGATIVE 03/25/2008 1702   BILIRUBINUR SMALL (A) 03/25/2008 1702   KETONESUR TRACE (A) 03/25/2008 1702   PROTEINUR NEGATIVE  03/25/2008 1702   UROBILINOGEN 0.2 03/25/2008 1702   NITRITE NEGATIVE 03/25/2008 1702   LEUKOCYTESUR  03/25/2008 1702    NEGATIVE MICROSCOPIC NOT DONE ON URINES WITH NEGATIVE PROTEIN, BLOOD, LEUKOCYTES, NITRITE, OR GLUCOSE <1000 mg/dL.    ----------------------------------------------------------------------------------------------------------------   Imaging Results:    Dg Chest 2 View  Result Date: 06/28/2017 CLINICAL DATA:  Productive cough 5 days. EXAM: CHEST  2 VIEW COMPARISON:  08/29/2014, 04/14/2012 and chest CT 04/17/2012 FINDINGS: Lungs are adequately inflated with chronic bilateral coarse interstitial changes. There is superimposed patchy hazy airspace opacification over the lung bases which may be due to acute infection superimposed on chronic interstitial disease. No significant effusion. Cardiomediastinal silhouette and remainder the exam is unchanged. IMPRESSION: Patchy airspace process over the lung bases likely acute infection on background of chronic interstitial disease. Electronically Signed   By: Marin Olp M.D.   On: 06/28/2017 18:05       Assessment & Plan:    Active Problems:   Pneumonia   CAP (community acquired pneumonia)    CAP Blood culture x2 Urine strep , urine legionella antigen resp viral panel influneza panel vanco iv pharmacy to dose, levaquin iv pharmacy to dose Check cbc in am  Tachycardia Tele Trop I q6h x3 D dimer If d dimer positive then CTA chest r/o PE  Renal insufficiency Hydrate with ns iv Check cmp in am  Anemia Check cbc in am  Copd Albuterol prn  Hypothyroidism Cont levothyroxine  Vitamin D def Cont vitamin D   DVT Prophylaxis Lovenox - SCDs   AM Labs Ordered, also please review Full Orders  Family Communication: Admission, patients condition and plan of care including tests being ordered have been discussed with the patient   who indicate understanding and agree with the plan and Code Status.  Code Status  FULL CODE  Likely DC to  home  Condition GUARDED    Consults called:    none  Admission status: inpatient  Time spent in minutes : 45   Jani Gravel M.D on 06/28/2017 at 9:18 PM  Between 7am to 7pm - Pager - [phone]  . After 7pm go to [email or web address] - password [email or web address]  [facility] - Office  [phone]    

## 2017-06-28 NOTE — Progress Notes (Addendum)
Pharmacy Note:  Initial antibiotic(s) regimen of Vancomycin and Levaquin ordered by EDP to treat PNA.  CrCl cannot be calculated (Patient's most recent lab result is older than the maximum 21 days allowed.).   Allergies  Allergen Reactions  . Penicillins Itching    Has patient had a PCN reaction causing immediate rash, facial/tongue/throat swelling, SOB or lightheadedness with hypotension: no Has patient had a PCN reaction causing severe rash involving mucus membranes or skin necrosis: No Has patient had a PCN reaction that required hospitalization No Has patient had a PCN reaction occurring within the last 10 years: No If all of the above answers are "NO", then may proceed with Cephalosporin use.   . Vicodin [Hydrocodone-Acetaminophen] Nausea And Vomiting    Vitals:   06/28/17 1718 06/28/17 1842  BP: 139/83   Pulse: (!) 120   Resp: (!) 22   Temp: 100.1 F (37.8 C) (!) 100.5 F (38.1 C)  SpO2: (!) 85%     Anti-infectives (From admission, onward)   Start     Dose/Rate Route Frequency Ordered Stop   06/28/17 1845  levofloxacin (LEVAQUIN) IVPB 750 mg     750 mg 100 mL/hr over 90 Minutes Intravenous  Once 06/28/17 1838     06/28/17 1845  vancomycin (VANCOCIN) IVPB 1000 mg/200 mL premix     1,000 mg 200 mL/hr over 60 Minutes Intravenous  Once 06/28/17 1838       Plan: Initial dose(s) of Vancomycin and Levaquin X 1 ordered. F/U admission orders for further dosing if therapy continued.  Pricilla Larsson, [facility] 06/28/2017 6:43 PM   Addendum: Continued on admission. Vancomycin 500mg  IV every 24 hours. Goal trough 15-20 Levaquin 750mg  IV every 48 hours. Monitor labs, micro and vitals.  Pricilla Larsson, [facility] 06/28/2017 9:29 PM

## 2017-06-28 NOTE — ED Triage Notes (Signed)
Cough and congestion with thick yellow sputum x 1 week.

## 2017-06-29 ENCOUNTER — Encounter (HOSPITAL_COMMUNITY): Payer: Self-pay | Admitting: Family Medicine

## 2017-06-29 ENCOUNTER — Inpatient Hospital Stay (HOSPITAL_COMMUNITY): Payer: Medicare HMO

## 2017-06-29 ENCOUNTER — Other Ambulatory Visit: Payer: Self-pay

## 2017-06-29 DIAGNOSIS — E039 Hypothyroidism, unspecified: Secondary | ICD-10-CM

## 2017-06-29 DIAGNOSIS — I959 Hypotension, unspecified: Secondary | ICD-10-CM

## 2017-06-29 DIAGNOSIS — J189 Pneumonia, unspecified organism: Principal | ICD-10-CM

## 2017-06-29 LAB — GLUCOSE, CAPILLARY
GLUCOSE-CAPILLARY: 134 mg/dL — AB (ref 65–99)
GLUCOSE-CAPILLARY: 98 mg/dL (ref 65–99)
Glucose-Capillary: 126 mg/dL — ABNORMAL HIGH (ref 65–99)

## 2017-06-29 LAB — CBC
HEMATOCRIT: 22.6 % — AB (ref 36.0–46.0)
HEMOGLOBIN: 6.4 g/dL — AB (ref 12.0–15.0)
MCH: 22.8 pg — ABNORMAL LOW (ref 26.0–34.0)
MCHC: 28.3 g/dL — ABNORMAL LOW (ref 30.0–36.0)
MCV: 80.4 fL (ref 78.0–100.0)
Platelets: 263 10*3/uL (ref 150–400)
RBC: 2.81 MIL/uL — AB (ref 3.87–5.11)
RDW: 16.2 % — ABNORMAL HIGH (ref 11.5–15.5)
WBC: 8.4 10*3/uL (ref 4.0–10.5)

## 2017-06-29 LAB — RESPIRATORY PANEL BY PCR
Adenovirus: NOT DETECTED
BORDETELLA PERTUSSIS-RVPCR: NOT DETECTED
CORONAVIRUS 229E-RVPPCR: NOT DETECTED
CORONAVIRUS HKU1-RVPPCR: NOT DETECTED
Chlamydophila pneumoniae: NOT DETECTED
Coronavirus NL63: NOT DETECTED
Coronavirus OC43: NOT DETECTED
INFLUENZA A-RVPPCR: NOT DETECTED
Influenza B: NOT DETECTED
METAPNEUMOVIRUS-RVPPCR: NOT DETECTED
MYCOPLASMA PNEUMONIAE-RVPPCR: NOT DETECTED
Parainfluenza Virus 1: NOT DETECTED
Parainfluenza Virus 2: NOT DETECTED
Parainfluenza Virus 3: NOT DETECTED
Parainfluenza Virus 4: NOT DETECTED
RESPIRATORY SYNCYTIAL VIRUS-RVPPCR: NOT DETECTED
RHINOVIRUS / ENTEROVIRUS - RVPPCR: NOT DETECTED

## 2017-06-29 LAB — COMPREHENSIVE METABOLIC PANEL
ALK PHOS: 67 U/L (ref 38–126)
ALT: 9 U/L — AB (ref 14–54)
AST: 18 U/L (ref 15–41)
Albumin: 2.3 g/dL — ABNORMAL LOW (ref 3.5–5.0)
Anion gap: 8 (ref 5–15)
BILIRUBIN TOTAL: 0.4 mg/dL (ref 0.3–1.2)
BUN: 27 mg/dL — AB (ref 6–20)
CALCIUM: 7.7 mg/dL — AB (ref 8.9–10.3)
CO2: 25 mmol/L (ref 22–32)
CREATININE: 1.11 mg/dL — AB (ref 0.44–1.00)
Chloride: 102 mmol/L (ref 101–111)
GFR calc Af Amer: 57 mL/min — ABNORMAL LOW (ref >60)
GFR calc non Af Amer: 49 mL/min — ABNORMAL LOW (ref >60)
GLUCOSE: 216 mg/dL — AB (ref 65–99)
POTASSIUM: 3.9 mmol/L (ref 3.5–5.1)
Sodium: 135 mmol/L (ref 135–145)
TOTAL PROTEIN: 6.1 g/dL — AB (ref 6.5–8.1)

## 2017-06-29 LAB — URINALYSIS, ROUTINE W REFLEX MICROSCOPIC
BILIRUBIN URINE: NEGATIVE
Bacteria, UA: NONE SEEN
GLUCOSE, UA: NEGATIVE mg/dL
HGB URINE DIPSTICK: NEGATIVE
KETONES UR: NEGATIVE mg/dL
Nitrite: NEGATIVE
PH: 5 (ref 5.0–8.0)
Protein, ur: NEGATIVE mg/dL
RBC / HPF: NONE SEEN RBC/hpf (ref 0–5)
Specific Gravity, Urine: 1.014 (ref 1.005–1.030)

## 2017-06-29 LAB — RETICULOCYTES
RBC.: 2.98 MIL/uL — ABNORMAL LOW (ref 3.87–5.11)
Retic Count, Absolute: 32.8 10*3/uL (ref 19.0–186.0)
Retic Ct Pct: 1.1 % (ref 0.4–3.1)

## 2017-06-29 LAB — IRON AND TIBC
IRON: 7 ug/dL — AB (ref 28–170)
SATURATION RATIOS: 3 % — AB (ref 10.4–31.8)
TIBC: 265 ug/dL (ref 250–450)
UIBC: 258 ug/dL

## 2017-06-29 LAB — HEMOGLOBIN A1C
HEMOGLOBIN A1C: 5.5 % (ref 4.8–5.6)
Mean Plasma Glucose: 111.15 mg/dL

## 2017-06-29 LAB — ABO/RH: ABO/RH(D): A POS

## 2017-06-29 LAB — MRSA PCR SCREENING: MRSA BY PCR: NEGATIVE

## 2017-06-29 LAB — VITAMIN B12: Vitamin B-12: 376 pg/mL (ref 180–914)

## 2017-06-29 LAB — D-DIMER, QUANTITATIVE: D-Dimer, Quant: 2.3 ug/mL-FEU — ABNORMAL HIGH (ref 0.00–0.50)

## 2017-06-29 LAB — PREPARE RBC (CROSSMATCH)

## 2017-06-29 LAB — TROPONIN I: Troponin I: <0.03 ng/mL (ref <0.03)

## 2017-06-29 LAB — FERRITIN: FERRITIN: 95 ng/mL (ref 11–307)

## 2017-06-29 LAB — FOLATE: Folate: 13.8 ng/mL (ref >5.9)

## 2017-06-29 MED ORDER — VITAMIN D (ERGOCALCIFEROL) 1.25 MG (50000 UNIT) PO CAPS
50000.0000 [IU] | ORAL_CAPSULE | ORAL | Status: DC
  Filled 2017-06-29: qty 1

## 2017-06-29 MED ORDER — ALUM & MAG HYDROXIDE-SIMETH 200-200-20 MG/5ML PO SUSP
30.0000 mL | Freq: Once | ORAL | Status: AC
  Administered 2017-06-29: 30 mL via ORAL
  Filled 2017-06-29: qty 30

## 2017-06-29 MED ORDER — SODIUM CHLORIDE 0.9 % IV SOLN: Freq: Once | INTRAVENOUS | Status: DC

## 2017-06-29 MED ORDER — SODIUM CHLORIDE 0.9 % IV BOLUS (SEPSIS)
500.0000 mL | Freq: Once | INTRAVENOUS | Status: AC
  Administered 2017-06-29: 500 mL via INTRAVENOUS

## 2017-06-29 MED ORDER — ACETAMINOPHEN 325 MG PO TABS
650.0000 mg | ORAL_TABLET | Freq: Once | ORAL | Status: AC
  Administered 2017-06-29: 650 mg via ORAL
  Filled 2017-06-29: qty 2

## 2017-06-29 MED ORDER — INSULIN ASPART 100 UNIT/ML ~~LOC~~ SOLN
0.0000 [IU] | Freq: Three times a day (TID) | SUBCUTANEOUS | Status: DC
  Administered 2017-06-29 – 2017-06-30 (×2): 1 [IU] via SUBCUTANEOUS

## 2017-06-29 MED ORDER — TECHNETIUM TO 99M ALBUMIN AGGREGATED
4.0000 | Freq: Once | INTRAVENOUS | Status: AC | PRN
  Administered 2017-06-29: 4.4 via INTRAVENOUS

## 2017-06-29 NOTE — Progress Notes (Signed)
Patient alert and oriented x4. No complaints of pain, shortness of breath, chest pain, dizziness, nausea or vomiting. Patient up out of bed to commode with x1 stand by assist. Urine sample obtained and sent to lab. Pneumococcal vaccine education provided and given. Patient tolerated PO medications well. IV fluids decreased to 91ml/hr per order. Patient is currently resting comfortably in bed with no signs or symptoms of distress.

## 2017-06-29 NOTE — Progress Notes (Signed)
Hemoglobin lab result of 6.4, Dr Wynetta Emery notified via text/pager. Patient alert and oriented x4. Asymptomatic.

## 2017-06-29 NOTE — Progress Notes (Addendum)
PROGRESS NOTE  Marilyn Solomon  JHE:[id number]  DOB: February 18, 1946  DOA: 06/28/2017 PCP: Lucianne Lei, MD  Brief Admission Hx: Marilyn Solomon  is a 72 y.o. female, w hypothyroidism, Copd, apparently presents with subjective fever tonight and cough w yellow sputum,  Pt presented due to fever.    MDM/Assessment & Plan:   1. CAP - continue current treatments with IV antibiotics, oxygen support, fluids, rest, follow cultures. 2. Sinus tachycardia - improved with supportive therapy and pneumonia treatment, given her elevated D dimer, did order VQ scan to rule out PE. 3. Elevated D dimer - VQ scan ordered to rule out PE.  4. Iron Deficiency Anemia unspecified - following.  Hemoccult stool. Transfuse 2 units PRBCs. 5. COPD - resume current respiratory treatments. 6. Hypothyroidism - resume home medications.   DVT prophylaxis: SCDs Code Status: Full  Family Communication: n/a Disposition Plan: TBD  Subjective: Pt reports that she is starting to feel better today.   Objective: Vitals:   06/29/17 0615 06/29/17 0630 06/29/17 0700 06/29/17 0726  BP: (!) 89/55 (!) 90/51 (!) 96/54   Pulse: 84 88 86 81  Resp: 20 19 19 20   Temp:    (!) 97.4 F (36.3 C)  TempSrc:    Oral  SpO2: 100% 100% 100% 100%  Weight:      Height:       No intake or output data in the 24 hours ending 06/29/17 0929 Filed Weights   06/28/17 1718 06/28/17 2336 06/29/17 0415  Weight: 53.1 kg (117 lb) 51.9 kg (114 lb 6.7 oz) 51.9 kg (114 lb 6.7 oz)   REVIEW OF SYSTEMS  As per history otherwise all reviewed and reported negative  Exam:  General exam: thin female, awake, alert, NAD, cooperative.  Respiratory system: crackles, rales RML, No increased work of breathing. Cardiovascular system: S1 & S2 heard normal.  Gastrointestinal system: Abdomen is nondistended, soft and nontender. Normal bowel sounds heard. Central nervous system: Alert and oriented. No focal neurological deficits. Extremities: no CCE.  Data  Reviewed: Basic Metabolic Panel: Recent Labs  Lab 06/28/17 1843  NA 136  K 3.9  CL 99*  CO2 27  GLUCOSE 142*  BUN 37*  CREATININE 1.32*  CALCIUM 8.9   Liver Function Tests: Recent Labs  Lab 06/28/17 1843  AST 20  ALT 11*  ALKPHOS 90  BILITOT 0.4  PROT 7.8  ALBUMIN 3.1*   No results for input(s): LIPASE, AMYLASE in the last 168 hours. No results for input(s): AMMONIA in the last 168 hours. CBC: Recent Labs  Lab 06/28/17 1843  WBC 14.0*  NEUTROABS 11.2*  HGB 8.2*  HCT 28.7*  MCV 78.8  PLT 395   Cardiac Enzymes: Recent Labs  Lab 06/28/17 1843 06/29/17 0024  TROPONINI <0.03 <0.03   CBG (last 3)  No results for input(s): GLUCAP in the last 72 hours. Recent Results (from the past 240 hour(s))  Blood Culture (routine x 2)     Status: None (Preliminary result)   Collection Time: 06/28/17  6:43 PM  Result Value Ref Range Status   Specimen Description BLOOD RIGHT HAND  Final   Special Requests   Final    BOTTLES DRAWN AEROBIC AND ANAEROBIC Blood Culture adequate volume   Culture PENDING  Incomplete   Report Status PENDING  Incomplete  Blood Culture (routine x 2)     Status: None (Preliminary result)   Collection Time: 06/28/17  6:43 PM  Result Value Ref Range Status   Specimen Description LEFT  ANTECUBITAL  Final   Special Requests   Final    BOTTLES DRAWN AEROBIC AND ANAEROBIC Blood Culture adequate volume   Culture PENDING  Incomplete   Report Status PENDING  Incomplete  MRSA PCR Screening     Status: None   Collection Time: 06/28/17 11:23 PM  Result Value Ref Range Status   MRSA by PCR NEGATIVE NEGATIVE Final    Comment:        The GeneXpert MRSA Assay (FDA approved for NASAL specimens only), is one component of a comprehensive MRSA colonization surveillance program. It is not intended to diagnose MRSA infection nor to guide or monitor treatment for MRSA infections.    Studies: Dg Chest 2 View  Result Date: 06/28/2017 CLINICAL DATA:   Productive cough 5 days. EXAM: CHEST  2 VIEW COMPARISON:  08/29/2014, 04/14/2012 and chest CT 04/17/2012 FINDINGS: Lungs are adequately inflated with chronic bilateral coarse interstitial changes. There is superimposed patchy hazy airspace opacification over the lung bases which may be due to acute infection superimposed on chronic interstitial disease. No significant effusion. Cardiomediastinal silhouette and remainder the exam is unchanged. IMPRESSION: Patchy airspace process over the lung bases likely acute infection on background of chronic interstitial disease. Electronically Signed   By: Marin Olp M.D.   On: 06/28/2017 18:05   Scheduled Meds: . enoxaparin (LOVENOX) injection  40 mg Subcutaneous Q24H  . gabapentin  300 mg Oral Daily  . levothyroxine  75 mcg Oral QAC breakfast  . pneumococcal 23 valent vaccine  0.5 mL Intramuscular Tomorrow-1000  . [START ON 07/02/2017] Vitamin D (Ergocalciferol)  50,000 Units Oral Weekly   Continuous Infusions: . sodium chloride 100 mL/hr at 06/29/17 0841  . [START ON 06/30/2017] levofloxacin (LEVAQUIN) IV    . vancomycin     Principal Problem:   CAP (community acquired pneumonia) Active Problems:   Pneumonia  Critical Care Time spent: 62 mins  Irwin Brakeman, MD, FAAFP [facility] Pager [phone]  If 7PM-7AM, please contact night-coverage [email or web address] Password [email or web address] 06/29/2017, 9:29 AM    LOS: 1 day

## 2017-06-30 ENCOUNTER — Encounter (HOSPITAL_COMMUNITY): Payer: Self-pay | Admitting: Family Medicine

## 2017-06-30 ENCOUNTER — Inpatient Hospital Stay (HOSPITAL_COMMUNITY): Payer: Medicare HMO

## 2017-06-30 DIAGNOSIS — E039 Hypothyroidism, unspecified: Secondary | ICD-10-CM | POA: Diagnosis present

## 2017-06-30 DIAGNOSIS — R748 Abnormal levels of other serum enzymes: Secondary | ICD-10-CM | POA: Diagnosis present

## 2017-06-30 DIAGNOSIS — F419 Anxiety disorder, unspecified: Secondary | ICD-10-CM | POA: Diagnosis present

## 2017-06-30 DIAGNOSIS — D500 Iron deficiency anemia secondary to blood loss (chronic): Secondary | ICD-10-CM

## 2017-06-30 DIAGNOSIS — J441 Chronic obstructive pulmonary disease with (acute) exacerbation: Secondary | ICD-10-CM | POA: Diagnosis present

## 2017-06-30 DIAGNOSIS — R000 Tachycardia, unspecified: Secondary | ICD-10-CM

## 2017-06-30 DIAGNOSIS — K219 Gastro-esophageal reflux disease without esophagitis: Secondary | ICD-10-CM | POA: Diagnosis present

## 2017-06-30 DIAGNOSIS — D509 Iron deficiency anemia, unspecified: Secondary | ICD-10-CM | POA: Diagnosis present

## 2017-06-30 DIAGNOSIS — J449 Chronic obstructive pulmonary disease, unspecified: Secondary | ICD-10-CM | POA: Diagnosis present

## 2017-06-30 LAB — URINE CULTURE: Culture: NO GROWTH

## 2017-06-30 LAB — TYPE AND SCREEN
ABO/RH(D): A POS
ANTIBODY SCREEN: NEGATIVE
UNIT DIVISION: 0
UNIT DIVISION: 0

## 2017-06-30 LAB — BPAM RBC
Blood Product Expiration Date: 201902162359
Blood Product Expiration Date: 201902212359
ISSUE DATE / TIME: 201902011936
ISSUE DATE / TIME: 201902011602
Unit Type and Rh: 6200
Unit Type and Rh: 6200

## 2017-06-30 LAB — COMPREHENSIVE METABOLIC PANEL
ALT: 56 U/L — ABNORMAL HIGH (ref 14–54)
AST: 163 U/L — ABNORMAL HIGH (ref 15–41)
Albumin: 2.4 g/dL — ABNORMAL LOW (ref 3.5–5.0)
Alkaline Phosphatase: 129 U/L — ABNORMAL HIGH (ref 38–126)
Anion gap: 8 (ref 5–15)
BUN: 16 mg/dL (ref 6–20)
CHLORIDE: 108 mmol/L (ref 101–111)
CO2: 27 mmol/L (ref 22–32)
CREATININE: 0.94 mg/dL (ref 0.44–1.00)
Calcium: 8 mg/dL — ABNORMAL LOW (ref 8.9–10.3)
Glucose, Bld: 156 mg/dL — ABNORMAL HIGH (ref 65–99)
POTASSIUM: 4.2 mmol/L (ref 3.5–5.1)
Sodium: 143 mmol/L (ref 135–145)
Total Bilirubin: 1 mg/dL (ref 0.3–1.2)
Total Protein: 6.6 g/dL (ref 6.5–8.1)

## 2017-06-30 LAB — CBC
HCT: 34 % — ABNORMAL LOW (ref 36.0–46.0)
Hemoglobin: 9.9 g/dL — ABNORMAL LOW (ref 12.0–15.0)
MCH: 24.5 pg — AB (ref 26.0–34.0)
MCHC: 29.1 g/dL — AB (ref 30.0–36.0)
MCV: 84.2 fL (ref 78.0–100.0)
Platelets: 319 10*3/uL (ref 150–400)
RBC: 4.04 MIL/uL (ref 3.87–5.11)
RDW: 15.8 % — AB (ref 11.5–15.5)
WBC: 12.5 10*3/uL — ABNORMAL HIGH (ref 4.0–10.5)

## 2017-06-30 LAB — GLUCOSE, CAPILLARY
GLUCOSE-CAPILLARY: 124 mg/dL — AB (ref 65–99)
GLUCOSE-CAPILLARY: 74 mg/dL (ref 65–99)
GLUCOSE-CAPILLARY: 114 mg/dL — AB (ref 65–99)
Glucose-Capillary: 172 mg/dL — ABNORMAL HIGH (ref 65–99)

## 2017-06-30 LAB — HIV ANTIBODY (ROUTINE TESTING W REFLEX): HIV Screen 4th Generation wRfx: NONREACTIVE

## 2017-06-30 MED ORDER — GUAIFENESIN-DM 100-10 MG/5ML PO SYRP
5.0000 mL | ORAL_SOLUTION | ORAL | Status: DC | PRN
  Administered 2017-06-30: 5 mL via ORAL
  Filled 2017-06-30: qty 5

## 2017-06-30 MED ORDER — ONDANSETRON HCL 4 MG/2ML IJ SOLN
4.0000 mg | Freq: Three times a day (TID) | INTRAMUSCULAR | Status: DC | PRN
  Administered 2017-07-01: 4 mg via INTRAVENOUS
  Filled 2017-06-30 (×2): qty 2

## 2017-06-30 MED ORDER — IOPAMIDOL (ISOVUE-370) INJECTION 76%
100.0000 mL | Freq: Once | INTRAVENOUS | Status: AC | PRN
  Administered 2017-06-30: 100 mL via INTRAVENOUS

## 2017-06-30 MED ORDER — SENNOSIDES-DOCUSATE SODIUM 8.6-50 MG PO TABS
1.0000 | ORAL_TABLET | Freq: Two times a day (BID) | ORAL | Status: DC
  Administered 2017-06-30 – 2017-07-01 (×2): 1 via ORAL
  Filled 2017-06-30 (×3): qty 1

## 2017-06-30 MED ORDER — PANTOPRAZOLE SODIUM 40 MG PO TBEC
40.0000 mg | DELAYED_RELEASE_TABLET | Freq: Every day | ORAL | Status: DC
  Administered 2017-06-30 – 2017-07-01 (×2): 40 mg via ORAL
  Filled 2017-06-30 (×2): qty 1

## 2017-06-30 NOTE — Progress Notes (Signed)
Patient very anxious, "body aching, can't get comfortable" per pt, increased coughing. Pt refusing any prn meds for anxiety or pain. Agrees to prn Robitussin for cough. Non-pharmacological supportive measures given for pain and anxiety. Will continue to monitor.

## 2017-06-30 NOTE — Progress Notes (Signed)
Pt vomiting green emesis with green solid pieces, pt states she ate green beans yesterday. Dr Manuella Ghazi notified, order for Zofran given.

## 2017-06-30 NOTE — Consult Note (Addendum)
Referring Provider: No ref. provider found Primary Care Physician:  Lucianne Lei, MD Primary Gastroenterologist:  DR. Gala Romney Reason for Consultation: ANEMIA   Impression: ADMITTED WITH SOB AND ANEMIA.  DIFFERENTIAL DIAGNOSIS INCLUDES: AVMs,  CAMERON'S EROSIONS,  ATROPHIC GASTRITIS, CELIAC DISEASE, OR LESS LIKELY UGI CANCER.  Plan: 1. SUPPORTIVE CARE 2. TRANSFUSE IF NEEDED 3. NEED EGD +/- GIVENS CAPSULE WHEN RESPIRATORY ISSUES RESOLVE, PT NOT A CANDIDATE FOR SEDATION THIS HOSPITAL STAY. 4. PROTONIX DAILY       HPI:  ADMITTED JAN 31 WITH INCREASING SOB. WORKUP IN ED REVEALED PNA V. COPD EXACERBATION. LST SEEN BY DR. Gala Romney IN JUN 2017, Hb 11.3. PRESENTED TO ED WITH Hb 8.2-->6.4-->9.9. HAD HEARTBURN  2 DAYS AGO AND HAD EPISODE OF VOMITING x1(NO BLOOD) FELT BETTER. NAUSEA THIS THIS AM. SOB ALRIGHT NOW AND AT HOME NOT USUALLY SOB. CAME TO ED BECAUSE SHE HAD A LOT OF CONGESTION AND THOUGHT SHE MIGHT HAVE PNA. BMs: LAST ONE YESTERDAY SOFT. HAD FLU SHOT: W/ DR. BLAND'S OFC.  PT DENIES FEVER, CHILLS, HEMATOCHEZIA, HEMATEMESIS, melena, diarrhea, CHEST PAIN, CHANGE IN BOWEL IN HABITS, constipation, abdominal pain, OR problems swallowing.   Past Medical History:  Diagnosis Date  . Anxiety   . Arthritis   . Chronic back pain   . COPD (chronic obstructive pulmonary disease) ([place])   . GERD (gastroesophageal reflux disease)   . Hypothyroidism   . Reflux    no medications currently, asymptomatic  . Thyroid disease     Past Surgical History:  Procedure Laterality Date  . ABDOMINAL HYSTERECTOMY    . cataracts    . COLONOSCOPY  2007   Dr. Gala Romney: normal rectum, diverticula   . COLONOSCOPY WITH PROPOFOL N/A 11/15/2015   Procedure: COLONOSCOPY WITH PROPOFOL;  Surgeon: Daneil Dolin, MD;  Location: [facility];  Service: Endoscopy;  Laterality: N/A;  1610  . FRACTURE SURGERY Right 2005   Wrist  . JOINT REPLACEMENT  2000  . TOTAL HIP ARTHROPLASTY    . WRIST SURGERY      Prior to  Admission medications   Medication Sig Start Date End Date Taking? Authorizing Provider  ALPRAZolam Duanne Moron) 1 MG tablet Take 1 mg by mouth 3 (three) times daily as needed for anxiety.  08/10/14  Yes [provider]  gabapentin (NEURONTIN) 300 MG capsule Take 1 capsule (300 mg total) by mouth daily. Patient taking differently: Take 300 mg by mouth daily as needed (for leg pain).  09/20/15  Yes Carole Civil, MD  levothyroxine (SYNTHROID, LEVOTHROID) 75 MCG tablet Take 75 mcg by mouth daily.     Yes [provider]  loratadine (ALLERGY RELIEF) 10 MG tablet Take 10 mg by mouth daily as needed for allergies.   Yes [provider]  oxyCODONE (ROXICODONE) 15 MG immediate release tablet Take 15 mg by mouth every 4 (four) hours as needed for pain.   Yes [provider]  Vitamin D, Ergocalciferol, (DRISDOL) 50000 UNITS CAPS capsule Take 50,000 Units by mouth once a week. 07/31/14  Yes [provider]    Current Facility-Administered Medications  Medication Dose Route Frequency Provider Last Rate Last Dose  . 0.9 %  sodium chloride infusion   Intravenous Once Johnson, Clanford L, MD      . albuterol (PROVENTIL) (2.5 MG/3ML) 0.083% nebulizer solution 2.5 mg  2.5 mg Nebulization Q6H PRN Jani Gravel, MD      . ALPRAZolam Duanne Moron) tablet 1 mg  1 mg Oral TID PRN Jani Gravel, MD      .  enoxaparin (LOVENOX) injection 40 mg  40 mg Subcutaneous Q24H Jani Gravel, MD   40 mg at 06/30/17 0835  . gabapentin (NEURONTIN) capsule 300 mg  300 mg Oral Daily Jani Gravel, MD   300 mg at 06/30/17 0836  . guaiFENesin-dextromethorphan (ROBITUSSIN DM) 100-10 MG/5ML syrup 5 mL  5 mL Oral Q4H PRN Johnson, Clanford L, MD   5 mL at 06/30/17 0310  . insulin aspart (novoLOG) injection 0-9 Units  0-9 Units Subcutaneous TID WC Wynetta Emery, Clanford L, MD   1 Units at 06/30/17 0835  . levofloxacin (LEVAQUIN) IVPB 750 mg  750 mg Intravenous Q48H Jani Gravel, MD      . levothyroxine (SYNTHROID,  LEVOTHROID) tablet 75 mcg  75 mcg Oral QAC breakfast Jani Gravel, MD   75 mcg at 06/30/17 [phone]  . ondansetron (ZOFRAN) injection 4 mg  4 mg Intravenous Q8H PRN Manuella Ghazi, Pratik D, DO      . oxyCODONE (Oxy IR/ROXICODONE) immediate release tablet 15 mg  15 mg Oral Q4H PRN Jani Gravel, MD      . pantoprazole (PROTONIX) EC tablet 40 mg  40 mg Oral Q0600 Johnson, Clanford L, MD   40 mg at 06/30/17 0836  . senna-docusate (Senokot-S) tablet 1 tablet  1 tablet Oral BID Irwin Brakeman L, MD   1 tablet at 06/30/17 0836  . [START ON 07/02/2017] Vitamin D (Ergocalciferol) (DRISDOL) capsule 50,000 Units  50,000 Units Oral Weekly Johnson, Clanford L, MD        Allergies as of 06/28/2017 - Review Complete 06/28/2017  Allergen Reaction Noted  . Penicillins Itching 12/05/2013  . Vicodin [hydrocodone-acetaminophen] Nausea And Vomiting 04/17/2011    Family History  Problem Relation Age of Onset  . Bone cancer Sister   . Pancreatic cancer Sister   . Pneumonia Mother   . Heart attack Father   . Diabetes Sister   . Dementia Sister   . Colon cancer Neg Hx      Social History   Socioeconomic History  . Marital status: Widowed    Spouse name: Not on file  . Number of children: Not on file  . Years of education: Not on file  . Highest education level: Not on file  Social Needs  . Financial resource strain: Not on file  . Food insecurity - worry: Not on file  . Food insecurity - inability: Not on file  . Transportation needs - medical: Not on file  . Transportation needs - non-medical: Not on file  Occupational History  . Occupation: disability  Tobacco Use  . Smoking status: Former Smoker    Packs/day: 25.00    Years: 1.00    Pack years: 25.00    Types: Cigarettes    Last attempt to quit: 08/27/1998    Years since quitting: 18.8  . Smokeless tobacco: Never Used  . Tobacco comment: quit in 2000  Substance and Sexual Activity  . Alcohol use: No    Alcohol/week: 0.0 oz  . Drug use: No  . Sexual  activity: Yes    Birth control/protection: Surgical  Other Topics Concern  . Not on file  Social History Narrative  . Not on file    Review of Systems: PER HPI OTHERWISE ALL SYSTEMS ARE NEGATIVE.   Vitals: Blood pressure (!) 150/82, pulse (!) 104, temperature 98.2 F (36.8 C), temperature source Oral, resp. rate (!) 23, height '5\' 5"'$  (1.651 m), weight 120 lb 13 oz (54.8 kg), SpO2 95 %.  Physical Exam: General:   Alert,  pleasant and cooperative in NAD, ABLE TO COMPLETE FULL SENTENCES Head:  Normocephalic and atraumatic. Eyes:  Sclera clear, no icterus.   Conjunctiva pink. Mouth:  No lesions, dentition ABnormal. Neck:  Supple; no masses. Lungs:  COARSE BS BILATERALLY, POOR AIR MOVEMENT, END EXPIRATORY wheezes. No acute distress. Heart:  Regular rate and rhythm; no murmurs, clicks, rubs,  or gallops. Abdomen:  Soft, nontender and nondistended. No masses, hepatosplenomegaly or hernias noted. Normal bowel sounds, without guarding, and without rebound.   Msk:  Symmetrical, ABNormal posture. Extremities:  Without edema. Neurologic:  Alert and  oriented x4;  NO  NEW FOCAL DEFICITS Cervical Nodes:  No significant cervical adenopathy. Psych:  Alert and cooperative. Normal mood and FLAT affect.   Lab Results: Recent Labs    06/28/17 1843 06/29/17 0951 06/30/17 0453  WBC 14.0* 8.4 12.5*  HGB 8.2* 6.4* 9.9*  HCT 28.7* 22.6* 34.0*  PLT 395 263 319   BMET Recent Labs    06/29/17 0951 06/30/17 0453  NA 135 143  K 3.9 4.2  CL 102 108  CO2 25 27  GLUCOSE 216* 156*  BUN 27* 16  CREATININE 1.11* 0.94  CALCIUM 7.7* 8.0*   LFT Recent Labs    06/30/17 0453  PROT 6.6  ALBUMIN 2.4*  AST 163*  ALT 56*  ALKPHOS 129*  BILITOT 1.0     Studies/Results: CT CHEST: FEB 2 COPD EXACERBATION, PULMONARY NODULES   LOS: 2 days   Marilyn Solomon  06/30/2017, 10:59 AM

## 2017-06-30 NOTE — Progress Notes (Addendum)
PROGRESS NOTE  Marilyn Solomon  ZOX:[id number]  DOB: 12/30/1945  DOA: 06/28/2017 PCP: Lucianne Lei, MD  Brief Admission Hx: Marilyn Solomon  is a 72 y.o. female, w hypothyroidism, Copd, apparently presents with subjective fever tonight and cough w yellow sputum,  Pt presented due to fever.    MDM/Assessment & Plan:   1. CAP - continue current treatments with IV antibiotics, oxygen support, fluids, rest, follow cultures. 2. Sinus tachycardia - given her elevated D dimer, get CTA chest to rule out PE, VQ scan indeterminate. 3. Elevated D dimer - VQ scan ordered to rule out PE.  4. Severe Iron Deficiency Anemia unspecified - following.  Hemoccult stool. Transfused 2 units PRBCs. GI consult.  5. COPD - resume current respiratory treatments. 6. Hypothyroidism - resume home medications.  7. AKI - resolved with IVF hydration.   DVT prophylaxis: SCDs Code Status: Full  Family Communication: n/a Disposition Plan: TBD  Subjective: Pt vomited some green emesis overnight.    Objective: Vitals:   06/30/17 0300 06/30/17 0400 06/30/17 0500 06/30/17 0600  BP: (!) 145/94 137/65 (!) 147/70 (!) 150/82  Pulse: (!) 120 (!) 108 (!) 112 (!) 107  Resp: (!) 29 (!) 24 (!) 25 (!) 27  Temp:  98.5 F (36.9 C)    TempSrc:  Oral    SpO2: 93% 97% 92% 97%  Weight:  54.8 kg (120 lb 13 oz)    Height:        Intake/Output Summary (Last 24 hours) at 06/30/2017 0644 Last data filed at 06/30/2017 0300 Gross per 24 hour  Intake 2717.08 ml  Output 700 ml  Net 2017.08 ml   Filed Weights   06/28/17 2336 06/29/17 0415 06/30/17 0400  Weight: 51.9 kg (114 lb 6.7 oz) 51.9 kg (114 lb 6.7 oz) 54.8 kg (120 lb 13 oz)   REVIEW OF SYSTEMS  As per history otherwise all reviewed and reported negative  Exam:  General exam: thin female, awake, alert, NAD, cooperative.  Respiratory system: crackles, rales RML, No increased work of breathing. Cardiovascular system: S1 & S2 heard normal.  Gastrointestinal system:  Abdomen is nondistended, soft and nontender. Normal bowel sounds heard. Central nervous system: Alert and oriented. No focal neurological deficits. Extremities: no CCE.  Data Reviewed: Basic Metabolic Panel: Recent Labs  Lab 06/28/17 1843 06/29/17 0951 06/30/17 0453  NA 136 135 143  K 3.9 3.9 4.2  CL 99* 102 108  CO2 27 25 27   GLUCOSE 142* 216* 156*  BUN 37* 27* 16  CREATININE 1.32* 1.11* 0.94  CALCIUM 8.9 7.7* 8.0*   Liver Function Tests: Recent Labs  Lab 06/28/17 1843 06/29/17 0951 06/30/17 0453  AST 20 18 163*  ALT 11* 9* 56*  ALKPHOS 90 67 129*  BILITOT 0.4 0.4 1.0  PROT 7.8 6.1* 6.6  ALBUMIN 3.1* 2.3* 2.4*   No results for input(s): LIPASE, AMYLASE in the last 168 hours. No results for input(s): AMMONIA in the last 168 hours. CBC: Recent Labs  Lab 06/28/17 1843 06/29/17 0951 06/30/17 0453  WBC 14.0* 8.4 12.5*  NEUTROABS 11.2*  --   --   HGB 8.2* 6.4* 9.9*  HCT 28.7* 22.6* 34.0*  MCV 78.8 80.4 84.2  PLT 395 263 319   Cardiac Enzymes: Recent Labs  Lab 06/28/17 1843 06/29/17 0024 06/29/17 0951  TROPONINI <0.03 <0.03 <0.03   CBG (last 3)  Recent Labs    06/29/17 1147 06/29/17 1638 06/29/17 2151  GLUCAP 134* 98 126*   Recent Results (from  the past 240 hour(s))  Blood Culture (routine x 2)     Status: None (Preliminary result)   Collection Time: 06/28/17  6:43 PM  Result Value Ref Range Status   Specimen Description BLOOD RIGHT HAND  Final   Special Requests   Final    BOTTLES DRAWN AEROBIC AND ANAEROBIC Blood Culture adequate volume   Culture   Final    NO GROWTH 2 DAYS Performed at [facility], [address], [place], [place] [address]    Report Status PENDING  Incomplete  Blood Culture (routine x 2)     Status: None (Preliminary result)   Collection Time: 06/28/17  6:43 PM  Result Value Ref Range Status   Specimen Description LEFT ANTECUBITAL  Final   Special Requests   Final    BOTTLES DRAWN AEROBIC AND ANAEROBIC Blood Culture  adequate volume   Culture   Final    NO GROWTH 2 DAYS Performed at [facility], [address] [place], [place] [address]    Report Status PENDING  Incomplete  Respiratory Panel by PCR     Status: None   Collection Time: 06/28/17  8:11 PM  Result Value Ref Range Status   Adenovirus NOT DETECTED NOT DETECTED Final   Coronavirus 229E NOT DETECTED NOT DETECTED Final   Coronavirus HKU1 NOT DETECTED NOT DETECTED Final   Coronavirus NL63 NOT DETECTED NOT DETECTED Final   Coronavirus OC43 NOT DETECTED NOT DETECTED Final   Metapneumovirus NOT DETECTED NOT DETECTED Final   Rhinovirus / Enterovirus NOT DETECTED NOT DETECTED Final   Influenza A NOT DETECTED NOT DETECTED Final   Influenza B NOT DETECTED NOT DETECTED Final   Parainfluenza Virus 1 NOT DETECTED NOT DETECTED Final   Parainfluenza Virus 2 NOT DETECTED NOT DETECTED Final   Parainfluenza Virus 3 NOT DETECTED NOT DETECTED Final   Parainfluenza Virus 4 NOT DETECTED NOT DETECTED Final   Respiratory Syncytial Virus NOT DETECTED NOT DETECTED Final   Bordetella pertussis NOT DETECTED NOT DETECTED Final   Chlamydophila pneumoniae NOT DETECTED NOT DETECTED Final   Mycoplasma pneumoniae NOT DETECTED NOT DETECTED Final  MRSA PCR Screening     Status: None   Collection Time: 06/28/17 11:23 PM  Result Value Ref Range Status   MRSA by PCR NEGATIVE NEGATIVE Final    Comment:        The GeneXpert MRSA Assay (FDA approved for NASAL specimens only), is one component of a comprehensive MRSA colonization surveillance program. It is not intended to diagnose MRSA infection nor to guide or monitor treatment for MRSA infections.    Studies: Dg Chest 2 View  Result Date: 06/28/2017 CLINICAL DATA:  Productive cough 5 days. EXAM: CHEST  2 VIEW COMPARISON:  08/29/2014, 04/14/2012 and chest CT 04/17/2012 FINDINGS: Lungs are adequately inflated with chronic bilateral coarse interstitial changes. There is superimposed patchy hazy airspace opacification  over the lung bases which may be due to acute infection superimposed on chronic interstitial disease. No significant effusion. Cardiomediastinal silhouette and remainder the exam is unchanged. IMPRESSION: Patchy airspace process over the lung bases likely acute infection on background of chronic interstitial disease. Electronically Signed   By: Marin Olp M.D.   On: 06/28/2017 18:05   Nm Pulmonary Perfusion  Result Date: 06/29/2017 CLINICAL DATA:  Severe congestion since 06/24/2017, question pulmonary embolism, history COPD EXAM: NUCLEAR MEDICINE PERFUSION LUNG SCAN TECHNIQUE: Perfusion images were obtained in multiple projections after intravenous injection of radiopharmaceutical. Patient unable to undergo ventilation exam due to being on  droplet precautions. RADIOPHARMACEUTICALS:  4.4 mCi Tc-42m MAA IV COMPARISON:  None Correlation chest radiograph 06/28/2017 FINDINGS: Patchy irregular perfusion throughout both lungs with multiple small peripheral subsegmental perfusion defects. Perfusion defects appear greater in the lower lobes. Patient has severe COPD changes and scattered infiltrates on chest radiograph. No ventilation exam for correlation. IMPRESSION: Multiple small peripheral subsegmental perfusion defects throughout both lungs with overall irregular perfusion pattern, greatest in the lower lobes. Unable to provide a probability rating due to the lack of accompanying ventilation exam. Above perfusion findings could be seen with severe parenchymal lung disease such as COPD with scattered infiltrates but also could be seen with multiple small pulmonary emboli. Electronically Signed   By: Lavonia Dana M.D.   On: 06/29/2017 12:15   Scheduled Meds: . enoxaparin (LOVENOX) injection  40 mg Subcutaneous Q24H  . gabapentin  300 mg Oral Daily  . insulin aspart  0-9 Units Subcutaneous TID WC  . levothyroxine  75 mcg Oral QAC breakfast  . [START ON 07/02/2017] Vitamin D (Ergocalciferol)  50,000 Units Oral  Weekly   Continuous Infusions: . sodium chloride 50 mL/hr at 06/30/17 0559  . sodium chloride    . levofloxacin (LEVAQUIN) IV    . vancomycin Stopped (06/29/17 2350)   Principal Problem:   CAP (community acquired pneumonia) Active Problems:   Pneumonia  Critical Care Time spent: 22 mins  Irwin Brakeman, MD, FAAFP [facility] Pager [phone]  If 7PM-7AM, please contact night-coverage [email or web address] Password [facility] 06/30/2017, 6:44 AM    LOS: 2 days

## 2017-07-01 DIAGNOSIS — Z23 Encounter for immunization: Secondary | ICD-10-CM | POA: Diagnosis not present

## 2017-07-01 DIAGNOSIS — D649 Anemia, unspecified: Secondary | ICD-10-CM

## 2017-07-01 DIAGNOSIS — K219 Gastro-esophageal reflux disease without esophagitis: Secondary | ICD-10-CM

## 2017-07-01 DIAGNOSIS — J449 Chronic obstructive pulmonary disease, unspecified: Secondary | ICD-10-CM

## 2017-07-01 LAB — COMPREHENSIVE METABOLIC PANEL
ALBUMIN: 2.3 g/dL — AB (ref 3.5–5.0)
ALT: 39 U/L (ref 14–54)
ANION GAP: 9 (ref 5–15)
AST: 56 U/L — AB (ref 15–41)
Alkaline Phosphatase: 117 U/L (ref 38–126)
BUN: 8 mg/dL (ref 6–20)
CO2: 31 mmol/L (ref 22–32)
Calcium: 8.9 mg/dL (ref 8.9–10.3)
Chloride: 106 mmol/L (ref 101–111)
Creatinine, Ser: 0.81 mg/dL (ref 0.44–1.00)
GFR calc Af Amer: >60 mL/min (ref >60)
Glucose, Bld: 99 mg/dL (ref 65–99)
POTASSIUM: 4.2 mmol/L (ref 3.5–5.1)
Sodium: 146 mmol/L — ABNORMAL HIGH (ref 135–145)
TOTAL PROTEIN: 6.3 g/dL — AB (ref 6.5–8.1)
Total Bilirubin: 0.5 mg/dL (ref 0.3–1.2)

## 2017-07-01 LAB — CBC
HCT: 32.9 % — ABNORMAL LOW (ref 36.0–46.0)
Hemoglobin: 9.5 g/dL — ABNORMAL LOW (ref 12.0–15.0)
MCH: 24.5 pg — ABNORMAL LOW (ref 26.0–34.0)
MCHC: 28.9 g/dL — ABNORMAL LOW (ref 30.0–36.0)
MCV: 85 fL (ref 78.0–100.0)
PLATELETS: 311 10*3/uL (ref 150–400)
RBC: 3.87 MIL/uL (ref 3.87–5.11)
RDW: 16.5 % — AB (ref 11.5–15.5)
WBC: 9.8 10*3/uL (ref 4.0–10.5)

## 2017-07-01 LAB — GLUCOSE, CAPILLARY: GLUCOSE-CAPILLARY: 87 mg/dL (ref 65–99)

## 2017-07-01 MED ORDER — LEVOFLOXACIN 750 MG PO TABS: 750.0000 mg | ORAL_TABLET | ORAL | 0 refills | Status: AC

## 2017-07-01 MED ORDER — PANTOPRAZOLE SODIUM 40 MG PO TBEC: 40.0000 mg | DELAYED_RELEASE_TABLET | Freq: Every day | ORAL | 0 refills | Status: DC

## 2017-07-01 MED ORDER — LEVOFLOXACIN IN D5W 500 MG/100ML IV SOLN: 500.0000 mg | INTRAVENOUS | Status: DC

## 2017-07-01 NOTE — Discharge Summary (Signed)
Physician Discharge Summary  Marilyn Solomon PTW:[id number] DOB: Mar 19, 1946 DOA: 06/28/2017  PCP: Marilyn Lei, MD  Admit date: 06/28/2017 Discharge date: 07/01/2017  Admitted From: Home  Disposition: Home  Recommendations for Outpatient Follow-up:  1. Follow up with PCP in 1 weeks 2. Please obtain BMP/CBC in one week 3. Follow-up with gastroenterologist in 2 weeks 4. Please follow up on the following pending results: Final culture data  Discharge Condition: STABLE   CODE STATUS: FULL    Brief Hospitalization Summary: Please see all hospital notes, images, labs for full details of the hospitalization.  Brief Admission Hx: ChristineMurrayis a20 y.o.female,w hypothyroidism, Copd, apparently presents withsubjective fever tonight andcoughw yellow sputum, Pt presented due to fever. The patient was admitted with pneumonia.  MDM/Assessment & Plan:   1. CAP -  treatments with IV antibiotics, oxygen support, fluids, rest, follow cultures.  No growth today. 2. Sinus tachycardia -resolved now, given her elevated D dimer, got CTA chest to rule out PE, VQ scan indeterminate.  Pt says that she feels better and she wants to go home today. Not willing to stay another day in hospital.  3. Elevated D dimer - VQ scan was nondiagnostic.   The patient also had a CTA chest that also ruled out PE. 4. Severe Iron Deficiency Anemia unspecified - following.  Hemoccult stool. Transfused 2 units PRBCs. GI consulted and they said that she should be transfused as needed.  Started Protonix daily.  She will need to be considered for outpatient EGD and possible capsule study when she has recovered from her acute infection.  I asked the patient to make an appointment to see her GI in 2 weeks.  The patient verbalized understanding.  I would like her to have her CBC retested with her primary care provider within a week.  The patient verbalized understanding.  5. COPD - resume current respiratory  treatments. 6. Hypothyroidism - resume home medications.  7. AKI - resolved with IVF hydration.   DVT prophylaxis: SCDs Code Status: Full  Family Communication: n/a Disposition Plan: Home   Discharge Diagnoses:  Principal Problem:   CAP (community acquired pneumonia) Active Problems:   Pneumonia   Anxiety   Hypothyroidism   GERD (gastroesophageal reflux disease)   COPD (chronic obstructive pulmonary disease) (HCC)   Elevated liver enzymes   Severe Iron deficiency anemia  Discharge Instructions: Discharge Instructions    Call MD for:  difficulty breathing, headache or visual disturbances   Complete by:  As directed    Call MD for:  extreme fatigue   Complete by:  As directed    Call MD for:  hives   Complete by:  As directed    Call MD for:  persistant dizziness or light-headedness   Complete by:  As directed    Call MD for:  persistant nausea and vomiting   Complete by:  As directed    Call MD for:  severe uncontrolled pain   Complete by:  As directed    Increase activity slowly   Complete by:  As directed      Allergies as of 07/01/2017      Reactions   Penicillins Itching   Has patient had a PCN reaction causing immediate rash, facial/tongue/throat swelling, SOB or lightheadedness with hypotension: no Has patient had a PCN reaction causing severe rash involving mucus membranes or skin necrosis: No Has patient had a PCN reaction that required hospitalization No Has patient had a PCN reaction occurring within the last 10 years: No  If all of the above answers are "NO", then may proceed with Cephalosporin use.   Vicodin [hydrocodone-acetaminophen] Nausea And Vomiting      Medication List    TAKE these medications   ALLERGY RELIEF 10 MG tablet Generic drug:  loratadine Take 10 mg by mouth daily as needed for allergies.   ALPRAZolam 1 MG tablet Commonly known as:  XANAX Take 1 mg by mouth 3 (three) times daily as needed for anxiety.   gabapentin 300 MG  capsule Commonly known as:  NEURONTIN Take 1 capsule (300 mg total) by mouth daily. What changed:    when to take this  reasons to take this   levofloxacin 750 MG tablet Commonly known as:  LEVAQUIN Take 1 tablet (750 mg total) by mouth every other day for 8 days. Start taking on:  07/02/2017   levothyroxine 75 MCG tablet Commonly known as:  SYNTHROID, LEVOTHROID Take 75 mcg by mouth daily.   oxyCODONE 15 MG immediate release tablet Commonly known as:  ROXICODONE Take 15 mg by mouth every 4 (four) hours as needed for pain.   pantoprazole 40 MG tablet Commonly known as:  PROTONIX Take 1 tablet (40 mg total) by mouth daily at 6 (six) AM. Start taking on:  07/02/2017   Vitamin D (Ergocalciferol) 50000 units Caps capsule Commonly known as:  DRISDOL Take 50,000 Units by mouth once a week.      Follow-up Information    Marilyn Lei, MD. Schedule an appointment as soon as possible for a visit in 1 week(s).   Specialty:  Family Medicine Why:  Hospital follow-up, check CBC Contact information: [address] [place] [address] [phone]        Marilyn Dolin, MD. Schedule an appointment as soon as possible for a visit in 2 week(s).   Specialty:  Gastroenterology Why:  Hospital follow-up Contact information: [place] [address] [phone]          Allergies  Allergen Reactions  . Penicillins Itching    Has patient had a PCN reaction causing immediate rash, facial/tongue/throat swelling, SOB or lightheadedness with hypotension: no Has patient had a PCN reaction causing severe rash involving mucus membranes or skin necrosis: No Has patient had a PCN reaction that required hospitalization No Has patient had a PCN reaction occurring within the last 10 years: No If all of the above answers are "NO", then may proceed with Cephalosporin use.   . Vicodin [Hydrocodone-Acetaminophen] Nausea And Vomiting   Allergies as of 07/01/2017       Reactions   Penicillins Itching   Has patient had a PCN reaction causing immediate rash, facial/tongue/throat swelling, SOB or lightheadedness with hypotension: no Has patient had a PCN reaction causing severe rash involving mucus membranes or skin necrosis: No Has patient had a PCN reaction that required hospitalization No Has patient had a PCN reaction occurring within the last 10 years: No If all of the above answers are "NO", then may proceed with Cephalosporin use.   Vicodin [hydrocodone-acetaminophen] Nausea And Vomiting      Medication List    TAKE these medications   ALLERGY RELIEF 10 MG tablet Generic drug:  loratadine Take 10 mg by mouth daily as needed for allergies.   ALPRAZolam 1 MG tablet Commonly known as:  XANAX Take 1 mg by mouth 3 (three) times daily as needed for anxiety.   gabapentin 300 MG capsule Commonly known as:  NEURONTIN Take 1 capsule (300 mg total)  by mouth daily. What changed:    when to take this  reasons to take this   levofloxacin 750 MG tablet Commonly known as:  LEVAQUIN Take 1 tablet (750 mg total) by mouth every other day for 8 days. Start taking on:  07/02/2017   levothyroxine 75 MCG tablet Commonly known as:  SYNTHROID, LEVOTHROID Take 75 mcg by mouth daily.   oxyCODONE 15 MG immediate release tablet Commonly known as:  ROXICODONE Take 15 mg by mouth every 4 (four) hours as needed for pain.   pantoprazole 40 MG tablet Commonly known as:  PROTONIX Take 1 tablet (40 mg total) by mouth daily at 6 (six) AM. Start taking on:  07/02/2017   Vitamin D (Ergocalciferol) 50000 units Caps capsule Commonly known as:  DRISDOL Take 50,000 Units by mouth once a week.       Procedures/Studies: Dg Chest 2 View  Result Date: 06/28/2017 CLINICAL DATA:  Productive cough 5 days. EXAM: CHEST  2 VIEW COMPARISON:  08/29/2014, 04/14/2012 and chest CT 04/17/2012 FINDINGS: Lungs are adequately inflated with chronic bilateral coarse interstitial  changes. There is superimposed patchy hazy airspace opacification over the lung bases which may be due to acute infection superimposed on chronic interstitial disease. No significant effusion. Cardiomediastinal silhouette and remainder the exam is unchanged. IMPRESSION: Patchy airspace process over the lung bases likely acute infection on background of chronic interstitial disease. Electronically Signed   By: Marin Olp M.D.   On: 06/28/2017 18:05   Ct Angio Chest Pe W Or Wo Contrast  Result Date: 06/30/2017 CLINICAL DATA:  Cough congestion fever for 7 days.  History of COPD. EXAM: CT ANGIOGRAPHY CHEST WITH CONTRAST TECHNIQUE: Multidetector CT imaging of the chest was performed using the standard protocol during bolus administration of intravenous contrast. Multiplanar CT image reconstructions and MIPs were obtained to evaluate the vascular anatomy. CONTRAST:  160mL ISOVUE-370 IOPAMIDOL (ISOVUE-370) INJECTION 76% COMPARISON:  Chest radiograph 06/28/2017, chest CT 04/17/2012 FINDINGS: Cardiovascular: Satisfactory opacification of the pulmonary arteries to the segmental level. No evidence of pulmonary embolism. Normal heart size. No pericardial effusion. Mild calcific atherosclerotic disease of the coronary arteries. Mediastinum/Nodes: Borderline enlarged anterior mediastinal lymph nodes. Circumferential thickening of the distal esophagus to the level of the GE junction. Mild enlargement of the right thyroid gland. Lungs/Pleura: Mild hyperinflation of the lungs. Upper lobe predominant emphysema. Upper lobe predominant cylindrical bronchiectasis. Numerous areas of patchy subpleural and peribronchial in predominance airspace consolidation involving both lungs. Most confluent areas of consolidation are seen in the right lower lobe, right upper lobe and right middle lobe. Additional areas of scattered ground-glass opacities throughout the lung parenchyma. Superimposed atelectatic changes in the right middle lobe and  lingula. Bilateral small pleural effusions. Upper Abdomen: No acute abnormality. Musculoskeletal: Healing or healed left 8, 9, 10 and eleventh rib fractures. Superior endplate compression deformity of T6 and T11 vertebral bodies. Review of the MIP images confirms the above findings. IMPRESSION: No evidence of pulmonary emboli. Hyperinflation of the lungs, upper lobe predominant emphysema and chronic bronchitic changes, suggestive of longstanding COPD. Numerous bilateral areas of confluent airspace disease in predominantly subpleural and peribronchial distribution may represent areas of scarring, chronic consolidation or pulmonary nodules. Such areas measure over 2.5 cm, particularly in the periphery of the right lung. Non-contrast chest CT at 3-6 months is recommended. If the nodules are stable at time of repeat CT, then future CT at 18-24 months (from today's scan) is considered optional for low-risk patients, but is recommended for high-risk patients.  This recommendation follows the consensus statement: Guidelines for Management of Incidental Pulmonary Nodules Detected on CT Images: From the Fleischner Society 2017; Radiology 2017; 284:228-243. Superimposed scattered ground-glass airspace opacities throughout both lungs suggest acute infectious/inflammatory component. Small bilateral pleural effusions. Circumferential thickening of the distal esophagus. This may represent findings of esophagitis, Barrett's esophagus or potentially esophageal cancer. Direct visualization is recommended. Calcific atherosclerotic disease of the coronary arteries. Superior endplate compression deformity of T6 and T11 vertebral bodies may be degenerative or represent grade 1 compression fractures, chronic. Emphysema (ICD10-J43.9). Electronically Signed   By: Fidela Salisbury M.D.   On: 06/30/2017 10:03   Nm Pulmonary Perfusion  Result Date: 06/29/2017 CLINICAL DATA:  Severe congestion since 06/24/2017, question pulmonary  embolism, history COPD EXAM: NUCLEAR MEDICINE PERFUSION LUNG SCAN TECHNIQUE: Perfusion images were obtained in multiple projections after intravenous injection of radiopharmaceutical. Patient unable to undergo ventilation exam due to being on droplet precautions. RADIOPHARMACEUTICALS:  4.4 mCi Tc-53m MAA IV COMPARISON:  None Correlation chest radiograph 06/28/2017 FINDINGS: Patchy irregular perfusion throughout both lungs with multiple small peripheral subsegmental perfusion defects. Perfusion defects appear greater in the lower lobes. Patient has severe COPD changes and scattered infiltrates on chest radiograph. No ventilation exam for correlation. IMPRESSION: Multiple small peripheral subsegmental perfusion defects throughout both lungs with overall irregular perfusion pattern, greatest in the lower lobes. Unable to provide a probability rating due to the lack of accompanying ventilation exam. Above perfusion findings could be seen with severe parenchymal lung disease such as COPD with scattered infiltrates but also could be seen with multiple small pulmonary emboli. Electronically Signed   By: Lavonia Dana M.D.   On: 06/29/2017 12:15   US Abdomen Complete  Result Date: 06/30/2017 CLINICAL DATA:  Elevated liver enzymes. EXAM: ABDOMEN ULTRASOUND COMPLETE COMPARISON:  None. FINDINGS: Gallbladder: No gallstones or wall thickening visualized. No sonographic Murphy sign noted by sonographer. Common bile duct: Diameter: 5 mm Liver: No focal lesion identified. Within normal limits in parenchymal echogenicity. Portal vein is patent on color Doppler imaging with normal direction of blood flow towards the liver. IVC: No abnormality visualized. Pancreas: Visualized portion unremarkable. Spleen: The spleen measures up to 12.9 cm with a volume of 262.9 cc. Right Kidney: Length: 11.1 cm. Echogenicity within normal limits. No mass or hydronephrosis visualized. Left Kidney: Length: 11.6 cm. Echogenicity within normal limits. No  mass or hydronephrosis visualized. Abdominal aorta: No aneurysm visualized. Other findings: None. IMPRESSION: No cause for the patient's elevated LFTs identified. Electronically Signed   By: Dorise Bullion III M.D   On: 06/30/2017 11:55      Subjective: Patient says she feels much better today and she wants to go home today.  She does not want to stay in the hospital for another day.  Discharge Exam: Vitals:   06/30/17 2105 07/01/17 0502  BP: 119/79 (!) 125/59  Pulse: 96 96  Resp: 18 18  Temp: 98.2 F (36.8 C) 98.6 F (37 C)  SpO2: 100% 100%   Vitals:   06/30/17 1109 06/30/17 1648 06/30/17 2105 07/01/17 0502  BP:  (!) 152/69 119/79 (!) 125/59  Pulse: 99 (!) 118 96 96  Resp: (!) 25 18 18 18   Temp: 97.9 F (36.6 C) 99.4 F (37.4 C) 98.2 F (36.8 C) 98.6 F (37 C)  TempSrc: Axillary Oral Oral Oral  SpO2: 97% 92% 100% 100%  Weight:  53.8 kg (118 lb 8 oz)    Height:       General exam: thin female, awake, alert,  NAD.  Respiratory system: better air movement and no rales heard, No increased work of breathing. Cardiovascular system: S1 & S2 heard normal.  Gastrointestinal system: Abdomen is nondistended, soft and nontender. Normal bowel sounds heard. Central nervous system: Alert and oriented. No focal neurological deficits. Extremities: no CCE.   The results of significant diagnostics from this hospitalization (including imaging, microbiology, ancillary and laboratory) are listed below for reference.     Microbiology: Recent Results (from the past 240 hour(s))  Blood Culture (routine x 2)     Status: None (Preliminary result)   Collection Time: 06/28/17  6:43 PM  Result Value Ref Range Status   Specimen Description BLOOD RIGHT HAND  Final   Special Requests   Final    BOTTLES DRAWN AEROBIC AND ANAEROBIC Blood Culture adequate volume   Culture   Final    NO GROWTH 3 DAYS Performed at [facility], [address] [place], [place] [address]    Report Status PENDING   Incomplete  Blood Culture (routine x 2)     Status: None (Preliminary result)   Collection Time: 06/28/17  6:43 PM  Result Value Ref Range Status   Specimen Description LEFT ANTECUBITAL  Final   Special Requests   Final    BOTTLES DRAWN AEROBIC AND ANAEROBIC Blood Culture adequate volume   Culture   Final    NO GROWTH 3 DAYS Performed at [facility], [address], [place], [place] [address]    Report Status PENDING  Incomplete  Respiratory Panel by PCR     Status: None   Collection Time: 06/28/17  8:11 PM  Result Value Ref Range Status   Adenovirus NOT DETECTED NOT DETECTED Final   Coronavirus 229E NOT DETECTED NOT DETECTED Final   Coronavirus HKU1 NOT DETECTED NOT DETECTED Final   Coronavirus NL63 NOT DETECTED NOT DETECTED Final   Coronavirus OC43 NOT DETECTED NOT DETECTED Final   Metapneumovirus NOT DETECTED NOT DETECTED Final   Rhinovirus / Enterovirus NOT DETECTED NOT DETECTED Final   Influenza A NOT DETECTED NOT DETECTED Final   Influenza B NOT DETECTED NOT DETECTED Final   Parainfluenza Virus 1 NOT DETECTED NOT DETECTED Final   Parainfluenza Virus 2 NOT DETECTED NOT DETECTED Final   Parainfluenza Virus 3 NOT DETECTED NOT DETECTED Final   Parainfluenza Virus 4 NOT DETECTED NOT DETECTED Final   Respiratory Syncytial Virus NOT DETECTED NOT DETECTED Final   Bordetella pertussis NOT DETECTED NOT DETECTED Final   Chlamydophila pneumoniae NOT DETECTED NOT DETECTED Final   Mycoplasma pneumoniae NOT DETECTED NOT DETECTED Final  MRSA PCR Screening     Status: None   Collection Time: 06/28/17 11:23 PM  Result Value Ref Range Status   MRSA by PCR NEGATIVE NEGATIVE Final    Comment:        The GeneXpert MRSA Assay (FDA approved for NASAL specimens only), is one component of a comprehensive MRSA colonization surveillance program. It is not intended to diagnose MRSA infection nor to guide or monitor treatment for MRSA infections.   Urine culture     Status: None   Collection  Time: 06/29/17 10:15 AM  Result Value Ref Range Status   Specimen Description   Final    URINE, CLEAN CATCH Performed at [facility], [address], [place], [place] [address]    Special Requests   Final    NONE Performed at [facility], [address] [place], [place] [address]    Culture   Final  NO GROWTH Performed at [facility], [place] [address] [place], [place] [address]    Report Status 06/30/2017 FINAL  Final     Labs: BNP (last 3 results) No results for input(s): BNP in the last 8760 hours. Basic Metabolic Panel: Recent Labs  Lab 06/28/17 1843 06/29/17 0951 06/30/17 0453 07/01/17 0601  NA 136 135 143 146*  K 3.9 3.9 4.2 4.2  CL 99* 102 108 106  CO2 27 25 27 31   GLUCOSE 142* 216* 156* 99  BUN 37* 27* 16 8  CREATININE 1.32* 1.11* 0.94 0.81  CALCIUM 8.9 7.7* 8.0* 8.9   Liver Function Tests: Recent Labs  Lab 06/28/17 1843 06/29/17 0951 06/30/17 0453 07/01/17 0601  AST 20 18 163* 56*  ALT 11* 9* 56* 39  ALKPHOS 90 67 129* 117  BILITOT 0.4 0.4 1.0 0.5  PROT 7.8 6.1* 6.6 6.3*  ALBUMIN 3.1* 2.3* 2.4* 2.3*   No results for input(s): LIPASE, AMYLASE in the last 168 hours. No results for input(s): AMMONIA in the last 168 hours. CBC: Recent Labs  Lab 06/28/17 1843 06/29/17 0951 06/30/17 0453 07/01/17 0601  WBC 14.0* 8.4 12.5* 9.8  NEUTROABS 11.2*  --   --   --   HGB 8.2* 6.4* 9.9* 9.5*  HCT 28.7* 22.6* 34.0* 32.9*  MCV 78.8 80.4 84.2 85.0  PLT 395 263 319 311   Cardiac Enzymes: Recent Labs  Lab 06/28/17 1843 06/29/17 0024 06/29/17 0951  TROPONINI <0.03 <0.03 <0.03   BNP: Invalid input(s): POCBNP CBG: Recent Labs  Lab 06/30/17 0730 06/30/17 1107 06/30/17 1721 06/30/17 2049 07/01/17 0728  GLUCAP 124* 74 114* 172* 87   D-Dimer Recent Labs    06/28/17 1843  DDIMER 2.30*   Hgb A1c Recent Labs    06/29/17 1221  HGBA1C 5.5   Lipid Profile No results for input(s): CHOL, HDL, LDLCALC, TRIG, CHOLHDL, LDLDIRECT in the last  72 hours. Thyroid function studies No results for input(s): TSH, T4TOTAL, T3FREE, THYROIDAB in the last 72 hours.  Invalid input(s): FREET3 Anemia work up Recent Labs    06/29/17 1221  VITAMINB12 376  FOLATE 13.8  FERRITIN 95  TIBC 265  IRON 7*  RETICCTPCT 1.1   Urinalysis    Component Value Date/Time   COLORURINE YELLOW 06/29/2017 1015   APPEARANCEUR HAZY (A) 06/29/2017 1015   LABSPEC 1.014 06/29/2017 1015   [place] 5.0 06/29/2017 1015   GLUCOSEU NEGATIVE 06/29/2017 1015   HGBUR NEGATIVE 06/29/2017 1015   BILIRUBINUR NEGATIVE 06/29/2017 1015   KETONESUR NEGATIVE 06/29/2017 1015   PROTEINUR NEGATIVE 06/29/2017 1015   UROBILINOGEN 0.2 03/25/2008 1702   NITRITE NEGATIVE 06/29/2017 1015   LEUKOCYTESUR TRACE (A) 06/29/2017 1015   Sepsis Labs Invalid input(s): PROCALCITONIN,  WBC,  LACTICIDVEN Microbiology Recent Results (from the past 240 hour(s))  Blood Culture (routine x 2)     Status: None (Preliminary result)   Collection Time: 06/28/17  6:43 PM  Result Value Ref Range Status   Specimen Description BLOOD RIGHT HAND  Final   Special Requests   Final    BOTTLES DRAWN AEROBIC AND ANAEROBIC Blood Culture adequate volume   Culture   Final    NO GROWTH 3 DAYS Performed at [facility], [address], [place], [place] [address]    Report Status PENDING  Incomplete  Blood Culture (routine x 2)     Status: None (Preliminary result)   Collection Time: 06/28/17  6:43 PM  Result Value Ref Range Status   Specimen  Description LEFT ANTECUBITAL  Final   Special Requests   Final    BOTTLES DRAWN AEROBIC AND ANAEROBIC Blood Culture adequate volume   Culture   Final    NO GROWTH 3 DAYS Performed at [facility], [address], [place], [place] [address]    Report Status PENDING  Incomplete  Respiratory Panel by PCR     Status: None   Collection Time: 06/28/17  8:11 PM  Result Value Ref Range Status   Adenovirus NOT DETECTED NOT DETECTED Final   Coronavirus 229E NOT DETECTED  NOT DETECTED Final   Coronavirus HKU1 NOT DETECTED NOT DETECTED Final   Coronavirus NL63 NOT DETECTED NOT DETECTED Final   Coronavirus OC43 NOT DETECTED NOT DETECTED Final   Metapneumovirus NOT DETECTED NOT DETECTED Final   Rhinovirus / Enterovirus NOT DETECTED NOT DETECTED Final   Influenza A NOT DETECTED NOT DETECTED Final   Influenza B NOT DETECTED NOT DETECTED Final   Parainfluenza Virus 1 NOT DETECTED NOT DETECTED Final   Parainfluenza Virus 2 NOT DETECTED NOT DETECTED Final   Parainfluenza Virus 3 NOT DETECTED NOT DETECTED Final   Parainfluenza Virus 4 NOT DETECTED NOT DETECTED Final   Respiratory Syncytial Virus NOT DETECTED NOT DETECTED Final   Bordetella pertussis NOT DETECTED NOT DETECTED Final   Chlamydophila pneumoniae NOT DETECTED NOT DETECTED Final   Mycoplasma pneumoniae NOT DETECTED NOT DETECTED Final  MRSA PCR Screening     Status: None   Collection Time: 06/28/17 11:23 PM  Result Value Ref Range Status   MRSA by PCR NEGATIVE NEGATIVE Final    Comment:        The GeneXpert MRSA Assay (FDA approved for NASAL specimens only), is one component of a comprehensive MRSA colonization surveillance program. It is not intended to diagnose MRSA infection nor to guide or monitor treatment for MRSA infections.   Urine culture     Status: None   Collection Time: 06/29/17 10:15 AM  Result Value Ref Range Status   Specimen Description   Final    URINE, CLEAN CATCH Performed at [facility], [address], [place], [place] [address]    Special Requests   Final    NONE Performed at [facility], [address] [place], [place] [address]    Culture   Final    NO GROWTH Performed at [facility], [place] [address] [place], [place] [address]    Report Status 06/30/2017 FINAL  Final    Time coordinating discharge: 37 mins  SIGNED:  Irwin Brakeman, MD  [facility] 07/01/2017, 10:42 AM Pager [phone]  If 7PM-7AM, please contact  night-coverage [email or web address] Password [email or web address]

## 2017-07-01 NOTE — Discharge Instructions (Signed)
Please have your CBC rechecked within 1 week with your primary care provider.  Please make an appointment to follow-up with your gastroenterologist in a couple of weeks.   Follow with Primary MD  Lucianne Lei, MD  and other consultants as instructed your Hospitalist MD  Please get a complete blood count and chemistry panel checked by your Primary MD at your next visit, and again as instructed by your Primary MD.  Get Medicines reviewed and adjusted: Please take all your medications with you for your next visit with your Primary MD  Laboratory/radiological data: Please request your Primary MD to go over all hospital tests and procedure/radiological results at the follow up, please ask your Primary MD to get all Hospital records sent to his/her office.  In some cases, they will be blood work, cultures and biopsy results pending at the time of your discharge. Please request that your primary care M.D. follows up on these results.  Also Note the following: If you experience worsening of your admission symptoms, develop shortness of breath, life threatening emergency, suicidal or homicidal thoughts you must seek medical attention immediately by calling 911 or calling your MD immediately  if symptoms less severe.  You must read complete instructions/literature along with all the possible adverse reactions/side effects for all the Medicines you take and that have been prescribed to you. Take any new Medicines after you have completely understood and accpet all the possible adverse reactions/side effects.   Do not drive when taking Pain medications or sleeping medications (Benzodaizepines)  Do not take more than prescribed Pain, Sleep and Anxiety Medications. It is not advisable to combine anxiety,sleep and pain medications without talking with your primary care practitioner  Special Instructions: If you have smoked or chewed Tobacco  in the last 2 yrs please stop smoking, stop any regular Alcohol   and or any Recreational drug use.  Wear Seat belts while driving.  Please note: You were cared for by a hospitalist during your hospital stay. Once you are discharged, your primary care physician will handle any further medical issues. Please note that NO REFILLS for any discharge medications will be authorized once you are discharged, as it is imperative that you return to your primary care physician (or establish a relationship with a primary care physician if you do not have one) for your post hospital discharge needs so that they can reassess your need for medications and monitor your lab values.

## 2017-07-01 NOTE — Progress Notes (Addendum)
Patient ID: Marilyn Solomon, female   DOB: [birth year], 72 y.o.   MRN: [id number]   Assessment/Plan: ADMITTED WITH SOB AND WEAKNESS. CLINICALLY IMPROVED. ANEMIA OF UNCERTAIN ETIOLOGY.  PLAN: 1. OPV W/ DR. Gala Romney IN 3-4 WEEKS. 2. PROTONIX DAILY 3. CBC IN 2 WEEKS 4. EGD AND POSSIBLE GIVENS IN 3-4 WEEKS.    Subjective: Since I last evaluated the patient SHE IS TOLERATING POs. NO BRBPR OR MELENA.  Objective: Vital signs in last 24 hours: Vitals:   06/30/17 2105 07/01/17 0502  BP: 119/79 (!) 125/59  Pulse: 96 96  Resp: 18 18  Temp: 98.2 F (36.8 C) 98.6 F (37 C)  SpO2: 100% 100%   General appearance: alert, cooperative and no distress Resp: diminished breath sounds bilaterally Cardio: regular rate and rhythm GI: soft, non-tender; bowel sounds normal;  Lab Results:  Hb 9.5 MCV 85 PLT CT 311   Studies/Results: US Abdomen Complete  Result Date: 06/30/2017 CLINICAL DATA:  Elevated liver enzymes. EXAM: ABDOMEN ULTRASOUND COMPLETE COMPARISON:  None. FINDINGS: Gallbladder: No gallstones or wall thickening visualized. No sonographic Murphy sign noted by sonographer. Common bile duct: Diameter: 5 mm Liver: No focal lesion identified. Within normal limits in parenchymal echogenicity. Portal vein is patent on color Doppler imaging with normal direction of blood flow towards the liver. IVC: No abnormality visualized. Pancreas: Visualized portion unremarkable. Spleen: The spleen measures up to 12.9 cm with a volume of 262.9 cc. Right Kidney: Length: 11.1 cm. Echogenicity within normal limits. No mass or hydronephrosis visualized. Left Kidney: Length: 11.6 cm. Echogenicity within normal limits. No mass or hydronephrosis visualized. Abdominal aorta: No aneurysm visualized. Other findings: None. IMPRESSION: No cause for the patient's elevated LFTs identified. Electronically Signed   By: Dorise Bullion III M.D   On: 06/30/2017 11:55    Medications: I have reviewed the patient's current  medications.   LOS: 5 days

## 2017-07-03 LAB — CULTURE, BLOOD (ROUTINE X 2)
CULTURE: NO GROWTH
Culture: NO GROWTH
SPECIAL REQUESTS: ADEQUATE
SPECIAL REQUESTS: ADEQUATE

## 2017-07-05 DIAGNOSIS — J189 Pneumonia, unspecified organism: Secondary | ICD-10-CM | POA: Diagnosis not present

## 2017-07-06 ENCOUNTER — Encounter: Payer: Self-pay | Admitting: Internal Medicine

## 2017-07-11 ENCOUNTER — Ambulatory Visit: Payer: Medicare HMO | Admitting: Gastroenterology

## 2017-07-27 DIAGNOSIS — I110 Hypertensive heart disease with heart failure: Secondary | ICD-10-CM | POA: Diagnosis not present

## 2017-07-27 DIAGNOSIS — M4000 Postural kyphosis, site unspecified: Secondary | ICD-10-CM | POA: Diagnosis not present

## 2017-07-27 DIAGNOSIS — E039 Hypothyroidism, unspecified: Secondary | ICD-10-CM | POA: Diagnosis not present

## 2017-07-27 DIAGNOSIS — J440 Chronic obstructive pulmonary disease with acute lower respiratory infection: Secondary | ICD-10-CM | POA: Diagnosis not present

## 2017-07-27 DIAGNOSIS — R946 Abnormal results of thyroid function studies: Secondary | ICD-10-CM | POA: Diagnosis not present

## 2017-07-27 DIAGNOSIS — J441 Chronic obstructive pulmonary disease with (acute) exacerbation: Secondary | ICD-10-CM | POA: Diagnosis not present

## 2017-07-27 DIAGNOSIS — R030 Elevated blood-pressure reading, without diagnosis of hypertension: Secondary | ICD-10-CM | POA: Diagnosis not present

## 2017-07-27 DIAGNOSIS — E785 Hyperlipidemia, unspecified: Secondary | ICD-10-CM | POA: Diagnosis not present

## 2017-07-27 DIAGNOSIS — G8929 Other chronic pain: Secondary | ICD-10-CM | POA: Diagnosis not present

## 2017-07-27 DIAGNOSIS — E118 Type 2 diabetes mellitus with unspecified complications: Secondary | ICD-10-CM | POA: Diagnosis not present

## 2017-07-27 DIAGNOSIS — M810 Age-related osteoporosis without current pathological fracture: Secondary | ICD-10-CM | POA: Diagnosis not present

## 2017-07-27 DIAGNOSIS — E038 Other specified hypothyroidism: Secondary | ICD-10-CM | POA: Diagnosis not present

## 2017-07-30 DIAGNOSIS — R030 Elevated blood-pressure reading, without diagnosis of hypertension: Secondary | ICD-10-CM | POA: Diagnosis not present

## 2017-07-30 DIAGNOSIS — G8929 Other chronic pain: Secondary | ICD-10-CM | POA: Diagnosis not present

## 2017-07-30 DIAGNOSIS — D649 Anemia, unspecified: Secondary | ICD-10-CM | POA: Diagnosis not present

## 2017-07-30 DIAGNOSIS — J189 Pneumonia, unspecified organism: Secondary | ICD-10-CM | POA: Diagnosis not present

## 2017-08-03 DIAGNOSIS — J029 Acute pharyngitis, unspecified: Secondary | ICD-10-CM | POA: Diagnosis not present

## 2017-08-03 DIAGNOSIS — J449 Chronic obstructive pulmonary disease, unspecified: Secondary | ICD-10-CM | POA: Diagnosis not present

## 2017-08-21 ENCOUNTER — Encounter: Payer: Self-pay | Admitting: Gastroenterology

## 2017-08-21 ENCOUNTER — Ambulatory Visit: Payer: Medicare HMO | Admitting: Gastroenterology

## 2017-08-21 VITALS — BP 160/90 | HR 91 | Temp 97.7°F | Ht 65.0 in | Wt 107.0 lb

## 2017-08-21 DIAGNOSIS — D649 Anemia, unspecified: Secondary | ICD-10-CM

## 2017-08-21 DIAGNOSIS — R195 Other fecal abnormalities: Secondary | ICD-10-CM | POA: Diagnosis not present

## 2017-08-21 DIAGNOSIS — R748 Abnormal levels of other serum enzymes: Secondary | ICD-10-CM | POA: Diagnosis not present

## 2017-08-21 NOTE — Patient Instructions (Signed)
1. Please have your labs done this week. We will decide next step for work up of anemia and positive Cologuard test after results are back.

## 2017-08-21 NOTE — Progress Notes (Signed)
Primary Care Physician:  Lucianne Lei, MD  Primary Gastroenterologist:  Garfield Cornea, MD   Chief Complaint  Patient presents with  . positive cologuard    last tcs 2017, not having any problems    HPI:  Marilyn Solomon is a 72 y.o. female here for further evaluation of a positive Cologuard test at the request of Dr. Criss Rosales. Patient had a colonoscopy in 10/2015 which showed grade II hemorrhoids, diverticulosis.   Patient seen in hospital in 06/2017 admitted with SOB and anemia. Dx with PNA. Her Hgb was 8.2 and dropped to 6.4. She required blood transfusion, with last Hgb 9.5 on 07/01/17. Iron was 7. Ferritin 95. TIBC normal. fe sat low. No overt GI bleeding. LFTs were elevated as well. abd u/s was unremarkable. CTA chest showed circumferential thickening of distal esophagus.   Dr. Criss Rosales stopped prilosec due to "affect it can have on the bones". Put on pantoprazole during recent hospitalization but she states she doesn't take all the time, heartburn ok. otc med if needed. No dysphagia. No abd pain. No melena, brbpr.   Current Outpatient Medications  Medication Sig Dispense Refill  . ALPRAZolam (XANAX) 1 MG tablet Take 1 mg by mouth 3 (three) times daily as needed for anxiety.     . carvedilol (COREG) 6.25 MG tablet Take 1 tablet by mouth 2 (two) times daily.    Marland Kitchen levothyroxine (SYNTHROID, LEVOTHROID) 75 MCG tablet Take 75 mcg by mouth daily.      Marland Kitchen lisinopril-hydrochlorothiazide (PRINZIDE,ZESTORETIC) 20-12.5 MG tablet Take 1 tablet by mouth daily.    Marland Kitchen loratadine (ALLERGY RELIEF) 10 MG tablet Take 10 mg by mouth daily as needed for allergies.    Marland Kitchen oxyCODONE (ROXICODONE) 15 MG immediate release tablet Take 15 mg by mouth every 4 (four) hours as needed for pain.    . Vitamin D, Ergocalciferol, (DRISDOL) 50000 UNITS CAPS capsule Take 50,000 Units by mouth once a week.     No current facility-administered medications for this visit.     Allergies as of 08/21/2017 - Review Complete 08/21/2017   Allergen Reaction Noted  . Penicillins Itching 12/05/2013  . Vicodin [hydrocodone-acetaminophen] Nausea And Vomiting 04/17/2011    Past Medical History:  Diagnosis Date  . Anxiety   . Arthritis   . Chronic back pain   . COPD (chronic obstructive pulmonary disease) ([place])   . GERD (gastroesophageal reflux disease)   . Hypothyroidism   . Reflux    no medications currently, asymptomatic  . Thyroid disease     Past Surgical History:  Procedure Laterality Date  . ABDOMINAL HYSTERECTOMY    . cataracts    . COLONOSCOPY  2007   Dr. Gala Romney: normal rectum, diverticula   . COLONOSCOPY WITH PROPOFOL N/A 11/15/2015   Dr. Gala Romney: grade II hemorrhoids, diverticulosis  . FRACTURE SURGERY Right 2005   Wrist  . JOINT REPLACEMENT  2000  . TOTAL HIP ARTHROPLASTY    . WRIST SURGERY      Family History  Problem Relation Age of Onset  . Bone cancer Sister   . Pancreatic cancer Sister   . Pneumonia Mother   . Heart attack Father   . Diabetes Sister   . Dementia Sister   . Colon cancer Neg Hx     Social History   Socioeconomic History  . Marital status: Widowed    Spouse name: Not on file  . Number of children: Not on file  . Years of education: Not on file  . Highest education  level: Not on file  Occupational History  . Occupation: disability  Social Needs  . Financial resource strain: Not on file  . Food insecurity:    Worry: Not on file    Inability: Not on file  . Transportation needs:    Medical: Not on file    Non-medical: Not on file  Tobacco Use  . Smoking status: Former Smoker    Packs/day: 25.00    Years: 1.00    Pack years: 25.00    Types: Cigarettes    Last attempt to quit: 08/27/1998    Years since quitting: 18.9  . Smokeless tobacco: Never Used  . Tobacco comment: quit in 2000  Substance and Sexual Activity  . Alcohol use: No    Alcohol/week: 0.0 oz  . Drug use: No  . Sexual activity: Yes    Birth control/protection: Surgical  Lifestyle  . Physical  activity:    Days per week: Not on file    Minutes per session: Not on file  . Stress: Not on file  Relationships  . Social connections:    Talks on phone: Not on file    Gets together: Not on file    Attends religious service: Not on file    Active member of club or organization: Not on file    Attends meetings of clubs or organizations: Not on file    Relationship status: Not on file  . Intimate partner violence:    Fear of current or ex partner: Not on file    Emotionally abused: Not on file    Physically abused: Not on file    Forced sexual activity: Not on file  Other Topics Concern  . Not on file  Social History Narrative  . Not on file      ROS:  General: Negative for anorexia, weight loss, fever, chills, fatigue, weakness. Eyes: Negative for vision changes.  ENT: Negative for hoarseness, difficulty swallowing , nasal congestion. CV: Negative for chest pain, angina, palpitations, +dyspnea on exertion, peripheral edema.  Respiratory: Negative for dyspnea at rest, dyspnea on exertion, cough, sputum, wheezing.  GI: See history of present illness. GU:  Negative for dysuria, hematuria, urinary incontinence, urinary frequency, nocturnal urination.  MS: Negative for joint pain, low back pain.  Derm: Negative for rash or itching.  Neuro: Negative for weakness, abnormal sensation, seizure, frequent headaches, memory loss, confusion.  Psych: Negative for anxiety, depression, suicidal ideation, hallucinations.  Endo: Negative for unusual weight change.  Heme: Negative for bruising or bleeding. Allergy: Negative for rash or hives.    Physical Examination:  BP (!) 160/90   Pulse 91   Temp 97.7 F (36.5 C) (Oral)   Ht 5\' 5"  (1.651 m)   Wt 107 lb (48.5 kg)   BMI 17.81 kg/m    General: Well-nourished, well-developed in no acute distress.  Head: Normocephalic, atraumatic.   Eyes: Conjunctiva pink, no icterus. Mouth: Oropharyngeal mucosa moist and pink , no lesions  erythema or exudate. Neck: Supple without thyromegaly, masses, or lymphadenopathy.  Lungs: Clear to auscultation bilaterally.  Heart: Regular rate and rhythm, no murmurs rubs or gallops.  Abdomen: Bowel sounds are normal, nontender, nondistended, no hepatosplenomegaly or masses, no abdominal bruits or    hernia , no rebound or guarding.   Rectal: not performed Extremities: No lower extremity edema. No clubbing or deformities.  Neuro: Alert and oriented x 4 , grossly normal neurologically.  Skin: Warm and dry, no rash or jaundice.   Psych: Alert and cooperative,  normal mood and affect.  Labs: Lab Results  Component Value Date   CREATININE 0.81 07/01/2017   BUN 8 07/01/2017   NA 146 (H) 07/01/2017   K 4.2 07/01/2017   CL 106 07/01/2017   CO2 31 07/01/2017   Lab Results  Component Value Date   ALT 39 07/01/2017   AST 56 (H) 07/01/2017   ALKPHOS 117 07/01/2017   BILITOT 0.5 07/01/2017   Lab Results  Component Value Date   WBC 9.8 07/01/2017   HGB 9.5 (L) 07/01/2017   HCT 32.9 (L) 07/01/2017   MCV 85.0 07/01/2017   PLT 311 07/01/2017   Lab Results  Component Value Date   IRON 7 (L) 06/29/2017   TIBC 265 06/29/2017   FERRITIN 95 06/29/2017   Lab Results  Component Value Date   VITAMINB12 376 06/29/2017   Lab Results  Component Value Date   FOLATE 13.8 06/29/2017     Imaging Studies: No results found.

## 2017-08-22 ENCOUNTER — Encounter: Payer: Self-pay | Admitting: Gastroenterology

## 2017-08-22 NOTE — Assessment & Plan Note (Signed)
Acute on chronic anemia requiring blood transfusion without evidence of overt bleeding. Anemia profile most c/w iron def. Ferritin was normal but could have been up due to PNA. CT scan showed abnormal esophagus, esophagitis. Recent Cologuard was positive (ordered as screening maneuver by PCP). This result could indicate blood in the stool and/or precancerous polyp/colon cancer.   Will update labs. Further recommendations to follow. Initially plan was for EGD +/- Capsule study for evaluation of IDA when she was seen in the hospital. Further recommendations to follow.

## 2017-08-22 NOTE — Assessment & Plan Note (Signed)
Repeat lfts.

## 2017-08-23 DIAGNOSIS — D649 Anemia, unspecified: Secondary | ICD-10-CM | POA: Diagnosis not present

## 2017-08-23 DIAGNOSIS — R748 Abnormal levels of other serum enzymes: Secondary | ICD-10-CM | POA: Diagnosis not present

## 2017-08-23 LAB — CBC WITH DIFFERENTIAL/PLATELET
Basophils Absolute: 40 cells/uL (ref 0–200)
Basophils Relative: 0.5 %
EOS PCT: 2.5 %
Eosinophils Absolute: 198 cells/uL (ref 15–500)
HEMATOCRIT: 36.6 % (ref 35.0–45.0)
HEMOGLOBIN: 11.5 g/dL — AB (ref 11.7–15.5)
LYMPHS ABS: 1967 {cells}/uL (ref 850–3900)
MCH: 25.2 pg — ABNORMAL LOW (ref 27.0–33.0)
MCHC: 31.4 g/dL — ABNORMAL LOW (ref 32.0–36.0)
MCV: 80.1 fL (ref 80.0–100.0)
MPV: 9.8 fL (ref 7.5–12.5)
Monocytes Relative: 8.8 %
NEUTROS ABS: 5001 {cells}/uL (ref 1500–7800)
NEUTROS PCT: 63.3 %
Platelets: 449 10*3/uL — ABNORMAL HIGH (ref 140–400)
RBC: 4.57 10*6/uL (ref 3.80–5.10)
RDW: 16.8 % — ABNORMAL HIGH (ref 11.0–15.0)
Total Lymphocyte: 24.9 %
WBC: 7.9 10*3/uL (ref 3.8–10.8)
WBCMIX: 695 {cells}/uL (ref 200–950)

## 2017-08-23 LAB — HEPATIC FUNCTION PANEL
AG RATIO: 0.9 (calc) — AB (ref 1.0–2.5)
ALBUMIN MSPROF: 3.6 g/dL (ref 3.6–5.1)
ALT: 7 U/L (ref 6–29)
AST: 16 U/L (ref 10–35)
Alkaline phosphatase (APISO): 67 U/L (ref 33–130)
BILIRUBIN DIRECT: 0.1 mg/dL (ref 0.0–0.2)
BILIRUBIN TOTAL: 0.4 mg/dL (ref 0.2–1.2)
GLOBULIN: 3.9 g/dL — AB (ref 1.9–3.7)
Indirect Bilirubin: 0.3 mg/dL (calc) (ref 0.2–1.2)
Total Protein: 7.5 g/dL (ref 6.1–8.1)

## 2017-08-23 NOTE — Progress Notes (Signed)
cc'ed to pcp °

## 2017-08-27 NOTE — Progress Notes (Signed)
Please let patient know her Hgb is better but still low. LFTs normal. Previous labs most c/w iron def as well.  Discussed positive Cologuard and recent transfusion dependent anemia with Dr. Gala Romney.  She needs TCS/EGD with propofol (Dx: IDA, transfusion dependent anemia, abnormal esophagus on CT).   Please let her know that if her TCS/EGD is unremarkable, she may need small bowel capsule study to complete work up but we are not scheduling at this time.

## 2017-08-28 ENCOUNTER — Other Ambulatory Visit: Payer: Self-pay

## 2017-08-28 DIAGNOSIS — D509 Iron deficiency anemia, unspecified: Secondary | ICD-10-CM

## 2017-08-28 DIAGNOSIS — D649 Anemia, unspecified: Secondary | ICD-10-CM

## 2017-08-28 DIAGNOSIS — R933 Abnormal findings on diagnostic imaging of other parts of digestive tract: Secondary | ICD-10-CM

## 2017-08-28 MED ORDER — PEG 3350-KCL-NA BICARB-NACL 420 G PO SOLR: 4000.0000 mL | ORAL | 0 refills | Status: DC

## 2017-08-30 NOTE — Progress Notes (Signed)
Hold iron 7 days before.

## 2017-08-31 DIAGNOSIS — J440 Chronic obstructive pulmonary disease with acute lower respiratory infection: Secondary | ICD-10-CM | POA: Diagnosis not present

## 2017-08-31 DIAGNOSIS — E039 Hypothyroidism, unspecified: Secondary | ICD-10-CM | POA: Diagnosis not present

## 2017-08-31 DIAGNOSIS — D500 Iron deficiency anemia secondary to blood loss (chronic): Secondary | ICD-10-CM | POA: Diagnosis not present

## 2017-09-28 NOTE — Patient Instructions (Signed)
Marilyn Solomon  09/28/2017     @PREFPERIOPPHARMACY @   Your procedure is scheduled on   10/08/2017   Report to [facility] at  900  A.M.  Call this number if you have problems the morning of surgery:  [phone]   Remember:  Do not eat food or drink liquids after midnight.  Take these medicines the morning of surgery with A SIP OF WATER  Xanax, coreg, levothyroxine, lisinopril, oxycodone.   Do not wear jewelry, make-up or nail polish.  Do not wear lotions, powders, or perfumes, or deodorant.  Do not shave 48 hours prior to surgery.  Men may shave face and neck.  Do not bring valuables to the hospital.  [facility] is not responsible for any belongings or valuables.  Contacts, dentures or bridgework may not be worn into surgery.  Leave your suitcase in the car.  After surgery it may be brought to your room.  For patients admitted to the hospital, discharge time will be determined by your treatment team.  Patients discharged the day of surgery will not be allowed to drive home.   Name and phone number of your driver:   family Special instructions:  Follow the diet and prep instructions given to you by Dr Roseanne Kaufman office.  Please read over the following fact sheets that you were given. Anesthesia Post-op Instructions and Care and Recovery After Surgery       Esophagogastroduodenoscopy Esophagogastroduodenoscopy (EGD) is a procedure to examine the lining of the esophagus, stomach, and first part of the small intestine (duodenum). This procedure is done to check for problems such as inflammation, bleeding, ulcers, or growths. During this procedure, a long, flexible, lighted tube with a camera attached (endoscope) is inserted down the throat. Tell a health care provider about:  Any allergies you have.  All medicines you are taking, including vitamins, herbs, eye drops, creams, and over-the-counter medicines.  Any problems you or family members have had  with anesthetic medicines.  Any blood disorders you have.  Any surgeries you have had.  Any medical conditions you have.  Whether you are pregnant or may be pregnant. What are the risks? Generally, this is a safe procedure. However, problems may occur, including:  Infection.  Bleeding.  A tear (perforation) in the esophagus, stomach, or duodenum.  Trouble breathing.  Excessive sweating.  Spasms of the larynx.  A slowed heartbeat.  Low blood pressure.  What happens before the procedure?  Follow instructions from your health care provider about eating or drinking restrictions.  Ask your health care provider about: ? Changing or stopping your regular medicines. This is especially important if you are taking diabetes medicines or blood thinners. ? Taking medicines such as aspirin and ibuprofen. These medicines can thin your blood. Do not take these medicines before your procedure if your health care provider instructs you not to.  Plan to have someone take you home after the procedure.  If you wear dentures, be ready to remove them before the procedure. What happens during the procedure?  To reduce your risk of infection, your health care team will wash or sanitize their hands.  An IV tube will be put in a vein in your hand or arm. You will get medicines and fluids through this tube.  You will be given one or more of the following: ? A medicine to help you relax (sedative). ? A medicine to numb the area (local  anesthetic). This medicine may be sprayed into your throat. It will make you feel more comfortable and keep you from gagging or coughing during the procedure. ? A medicine for pain.  A mouth guard may be placed in your mouth to protect your teeth and to keep you from biting on the endoscope.  You will be asked to lie on your left side.  The endoscope will be lowered down your throat into your esophagus, stomach, and duodenum.  Air will be put into the  endoscope. This will help your health care provider see better.  The lining of your esophagus, stomach, and duodenum will be examined.  Your health care provider may: ? Take a tissue sample so it can be looked at in a lab (biopsy). ? Remove growths. ? Remove objects (foreign bodies) that are stuck. ? Treat any bleeding with medicines or other devices that stop tissue from bleeding. ? Widen (dilate) or stretch narrowed areas of your esophagus and stomach.  The endoscope will be taken out. The procedure may vary among health care providers and hospitals. What happens after the procedure?  Your blood pressure, heart rate, breathing rate, and blood oxygen level will be monitored often until the medicines you were given have worn off.  Do not eat or drink anything until the numbing medicine has worn off and your gag reflex has returned. This information is not intended to replace advice given to you by your health care provider. Make sure you discuss any questions you have with your health care provider. Document Released: 09/15/2004 Document Revised: 10/21/2015 Document Reviewed: 04/08/2015 Elsevier Interactive Patient Education  2018 [employer]. Esophagogastroduodenoscopy, Care After Refer to this sheet in the next few weeks. These instructions provide you with information about caring for yourself after your procedure. Your health care provider may also give you more specific instructions. Your treatment has been planned according to current medical practices, but problems sometimes occur. Call your health care provider if you have any problems or questions after your procedure. What can I expect after the procedure? After the procedure, it is common to have:  A sore throat.  Nausea.  Bloating.  Dizziness.  Fatigue.  Follow these instructions at home:  Do not eat or drink anything until the numbing medicine (local anesthetic) has worn off and your gag reflex has returned. You  will know that the local anesthetic has worn off when you can swallow comfortably.  Do not drive for 24 hours if you received a medicine to help you relax (sedative).  If your health care provider took a tissue sample for testing during the procedure, make sure to get your test results. This is your responsibility. Ask your health care provider or the department performing the test when your results will be ready.  Keep all follow-up visits as told by your health care provider. This is important. Contact a health care provider if:  You cannot stop coughing.  You are not urinating.  You are urinating less than usual. Get help right away if:  You have trouble swallowing.  You cannot eat or drink.  You have throat or chest pain that gets worse.  You are dizzy or light-headed.  You faint.  You have nausea or vomiting.  You have chills.  You have a fever.  You have severe abdominal pain.  You have black, tarry, or bloody stools. This information is not intended to replace advice given to you by your health care provider. Make sure you discuss any  questions you have with your health care provider. Document Released: 05/01/2012 Document Revised: 10/21/2015 Document Reviewed: 04/08/2015 Elsevier Interactive Patient Education  2018 [employer].  Colonoscopy, Adult A colonoscopy is an exam to look at the large intestine. It is done to check for problems, such as:  Lumps (tumors).  Growths (polyps).  Swelling (inflammation).  Bleeding.  What happens before the procedure? Eating and drinking Follow instructions from your doctor about eating and drinking. These instructions may include:  A few days before the procedure - follow a low-fiber diet. ? Avoid nuts. ? Avoid seeds. ? Avoid dried fruit. ? Avoid raw fruits. ? Avoid vegetables.  1-3 days before the procedure - follow a clear liquid diet. Avoid liquids that have red or purple dye. Drink only clear liquids, such  as: ? Clear broth or bouillon. ? Black coffee or tea. ? Clear juice. ? Clear soft drinks or sports drinks. ? Gelatin dessert. ? Popsicles.  On the day of the procedure - do not eat or drink anything during the 2 hours before the procedure.  Bowel prep If you were prescribed an oral bowel prep:  Take it as told by your doctor. Starting the day before your procedure, you will need to drink a lot of liquid. The liquid will cause you to poop (have bowel movements) until your poop is almost clear or light green.  If your skin or butt gets irritated from diarrhea, you may: ? Wipe the area with wipes that have medicine in them, such as adult wet wipes with aloe and vitamin E. ? Put something on your skin that soothes the area, such as petroleum jelly.  If you throw up (vomit) while drinking the bowel prep, take a break for up to 60 minutes. Then begin the bowel prep again. If you keep throwing up and you cannot take the bowel prep without throwing up, call your doctor.  General instructions  Ask your doctor about changing or stopping your normal medicines. This is important if you take diabetes medicines or blood thinners.  Plan to have someone take you home from the hospital or clinic. What happens during the procedure?  An IV tube may be put into one of your veins.  You will be given medicine to help you relax (sedative).  To reduce your risk of infection: ? Your doctors will wash their hands. ? Your anal area will be washed with soap.  You will be asked to lie on your side with your knees bent.  Your doctor will get a long, thin, flexible tube ready. The tube will have a camera and a light on the end.  The tube will be put into your anus.  The tube will be gently put into your large intestine.  Air will be delivered into your large intestine to keep it open. You may feel some pressure or cramping.  The camera will be used to take photos.  A small tissue sample may be  removed from your body to be looked at under a microscope (biopsy). If any possible problems are found, the tissue will be sent to a lab for testing.  If small growths are found, your doctor may remove them and have them checked for cancer.  The tube that was put into your anus will be slowly removed. The procedure may vary among doctors and hospitals. What happens after the procedure?  Your doctor will check on you often until the medicines you were given have worn off.  Do not  drive for 24 hours after the procedure.  You may have a small amount of blood in your poop.  You may pass gas.  You may have mild cramps or bloating in your belly (abdomen).  It is up to you to get the results of your procedure. Ask your doctor, or the department performing the procedure, when your results will be ready. This information is not intended to replace advice given to you by your health care provider. Make sure you discuss any questions you have with your health care provider. Document Released: 06/17/2010 Document Revised: 03/15/2016 Document Reviewed: 07/27/2015 Elsevier Interactive Patient Education  2017 [employer]  Colonoscopy, Adult, Care After This sheet gives you information about how to care for yourself after your procedure. Your health care provider may also give you more specific instructions. If you have problems or questions, contact your health care provider. What can I expect after the procedure? After the procedure, it is common to have:  A small amount of blood in your stool for 24 hours after the procedure.  Some gas.  Mild abdominal cramping or bloating.  Follow these instructions at home: General instructions   For the first 24 hours after the procedure: ? Do not drive or use machinery. ? Do not sign important documents. ? Do not drink alcohol. ? Do your regular daily activities at a slower pace than normal. ? Eat soft, easy-to-digest foods. ? Rest  often.  Take over-the-counter or prescription medicines only as told by your health care provider.  It is up to you to get the results of your procedure. Ask your health care provider, or the department performing the procedure, when your results will be ready. Relieving cramping and bloating  Try walking around when you have cramps or feel bloated.  Apply heat to your abdomen as told by your health care provider. Use a heat source that your health care provider recommends, such as a moist heat pack or a heating pad. ? Place a towel between your skin and the heat source. ? Leave the heat on for 20-30 minutes. ? Remove the heat if your skin turns bright red. This is especially important if you are unable to feel pain, heat, or cold. You may have a greater risk of getting burned. Eating and drinking  Drink enough fluid to keep your urine clear or pale yellow.  Resume your normal diet as instructed by your health care provider. Avoid heavy or fried foods that are hard to digest.  Avoid drinking alcohol for as long as instructed by your health care provider. Contact a health care provider if:  You have blood in your stool 2-3 days after the procedure. Get help right away if:  You have more than a small spotting of blood in your stool.  You pass large blood clots in your stool.  Your abdomen is swollen.  You have nausea or vomiting.  You have a fever.  You have increasing abdominal pain that is not relieved with medicine. This information is not intended to replace advice given to you by your health care provider. Make sure you discuss any questions you have with your health care provider. Document Released: 12/28/2003 Document Revised: 02/07/2016 Document Reviewed: 07/27/2015 Elsevier Interactive Patient Education  2018 [place] Anesthesia is a term that refers to techniques, procedures, and medicines that help a person stay safe and comfortable  during a medical procedure. Monitored anesthesia care, or sedation, is one type of anesthesia. Your  anesthesia specialist may recommend sedation if you will be having a procedure that does not require you to be unconscious, such as:  Cataract surgery.  A dental procedure.  A biopsy.  A colonoscopy.  During the procedure, you may receive a medicine to help you relax (sedative). There are three levels of sedation:  Mild sedation. At this level, you may feel awake and relaxed. You will be able to follow directions.  Moderate sedation. At this level, you will be sleepy. You may not remember the procedure.  Deep sedation. At this level, you will be asleep. You will not remember the procedure.  The more medicine you are given, the deeper your level of sedation will be. Depending on how you respond to the procedure, the anesthesia specialist may change your level of sedation or the type of anesthesia to fit your needs. An anesthesia specialist will monitor you closely during the procedure. Let your health care provider know about:  Any allergies you have.  All medicines you are taking, including vitamins, herbs, eye drops, creams, and over-the-counter medicines.  Any use of steroids (by mouth or as a cream).  Any problems you or family members have had with sedatives and anesthetic medicines.  Any blood disorders you have.  Any surgeries you have had.  Any medical conditions you have, such as sleep apnea.  Whether you are pregnant or may be pregnant.  Any use of cigarettes, alcohol, or street drugs. What are the risks? Generally, this is a safe procedure. However, problems may occur, including:  Getting too much medicine (oversedation).  Nausea.  Allergic reaction to medicines.  Trouble breathing. If this happens, a breathing tube may be used to help with breathing. It will be removed when you are awake and breathing on your own.  Heart trouble.  Lung trouble.  Before  the procedure Staying hydrated Follow instructions from your health care provider about hydration, which may include:  Up to 2 hours before the procedure - you may continue to drink clear liquids, such as water, clear fruit juice, black coffee, and plain tea.  Eating and drinking restrictions Follow instructions from your health care provider about eating and drinking, which may include:  8 hours before the procedure - stop eating heavy meals or foods such as meat, fried foods, or fatty foods.  6 hours before the procedure - stop eating light meals or foods, such as toast or cereal.  6 hours before the procedure - stop drinking milk or drinks that contain milk.  2 hours before the procedure - stop drinking clear liquids.  Medicines Ask your health care provider about:  Changing or stopping your regular medicines. This is especially important if you are taking diabetes medicines or blood thinners.  Taking medicines such as aspirin and ibuprofen. These medicines can thin your blood. Do not take these medicines before your procedure if your health care provider instructs you not to.  Tests and exams  You will have a physical exam.  You may have blood tests done to show: ? How well your kidneys and liver are working. ? How well your blood can clot.  General instructions  Plan to have someone take you home from the hospital or clinic.  If you will be going home right after the procedure, plan to have someone with you for 24 hours.  What happens during the procedure?  Your blood pressure, heart rate, breathing, level of pain and overall condition will be monitored.  An IV tube will  be inserted into one of your veins.  Your anesthesia specialist will give you medicines as needed to keep you comfortable during the procedure. This may mean changing the level of sedation.  The procedure will be performed. After the procedure  Your blood pressure, heart rate, breathing rate, and  blood oxygen level will be monitored until the medicines you were given have worn off.  Do not drive for 24 hours if you received a sedative.  You may: ? Feel sleepy, clumsy, or nauseous. ? Feel forgetful about what happened after the procedure. ? Have a sore throat if you had a breathing tube during the procedure. ? Vomit. This information is not intended to replace advice given to you by your health care provider. Make sure you discuss any questions you have with your health care provider. Document Released: 02/08/2005 Document Revised: 10/22/2015 Document Reviewed: 09/05/2015 Elsevier Interactive Patient Education  2018 [place], Care After These instructions provide you with information about caring for yourself after your procedure. Your health care provider may also give you more specific instructions. Your treatment has been planned according to current medical practices, but problems sometimes occur. Call your health care provider if you have any problems or questions after your procedure. What can I expect after the procedure? After your procedure, it is common to:  Feel sleepy for several hours.  Feel clumsy and have poor balance for several hours.  Feel forgetful about what happened after the procedure.  Have poor judgment for several hours.  Feel nauseous or vomit.  Have a sore throat if you had a breathing tube during the procedure.  Follow these instructions at home: For at least 24 hours after the procedure:   Do not: ? Participate in activities in which you could fall or become injured. ? Drive. ? Use heavy machinery. ? Drink alcohol. ? Take sleeping pills or medicines that cause drowsiness. ? Make important decisions or sign legal documents. ? Take care of children on your own.  Rest. Eating and drinking  Follow the diet that is recommended by your health care provider.  If you vomit, drink water, juice, or soup when you  can drink without vomiting.  Make sure you have little or no nausea before eating solid foods. General instructions  Have a responsible adult stay with you until you are awake and alert.  Take over-the-counter and prescription medicines only as told by your health care provider.  If you smoke, do not smoke without supervision.  Keep all follow-up visits as told by your health care provider. This is important. Contact a health care provider if:  You keep feeling nauseous or you keep vomiting.  You feel light-headed.  You develop a rash.  You have a fever. Get help right away if:  You have trouble breathing. This information is not intended to replace advice given to you by your health care provider. Make sure you discuss any questions you have with your health care provider. Document Released: 09/05/2015 Document Revised: 01/05/2016 Document Reviewed: 09/05/2015 Elsevier Interactive Patient Education  [employer].

## 2017-10-02 DIAGNOSIS — D500 Iron deficiency anemia secondary to blood loss (chronic): Secondary | ICD-10-CM | POA: Diagnosis not present

## 2017-10-02 DIAGNOSIS — J449 Chronic obstructive pulmonary disease, unspecified: Secondary | ICD-10-CM | POA: Diagnosis not present

## 2017-10-02 DIAGNOSIS — E039 Hypothyroidism, unspecified: Secondary | ICD-10-CM | POA: Diagnosis not present

## 2017-10-02 DIAGNOSIS — M810 Age-related osteoporosis without current pathological fracture: Secondary | ICD-10-CM | POA: Diagnosis not present

## 2017-10-03 ENCOUNTER — Other Ambulatory Visit: Payer: Self-pay

## 2017-10-03 ENCOUNTER — Encounter (HOSPITAL_COMMUNITY)
Admission: RE | Admit: 2017-10-03 | Discharge: 2017-10-03 | Disposition: A | Discharge status: Home / Self Care | Payer: Medicare HMO | Source: Ambulatory Visit | Attending: Internal Medicine | Admitting: Internal Medicine

## 2017-10-03 ENCOUNTER — Encounter (HOSPITAL_COMMUNITY): Payer: Self-pay

## 2017-10-03 DIAGNOSIS — D509 Iron deficiency anemia, unspecified: Secondary | ICD-10-CM | POA: Diagnosis not present

## 2017-10-03 DIAGNOSIS — Z01818 Encounter for other preprocedural examination: Secondary | ICD-10-CM | POA: Diagnosis not present

## 2017-10-03 HISTORY — DX: Anemia, unspecified: D64.9

## 2017-10-03 HISTORY — DX: Essential (primary) hypertension: I10

## 2017-10-03 LAB — CBC WITH DIFFERENTIAL/PLATELET
Basophils Absolute: 0 10*3/uL (ref 0.0–0.1)
Basophils Relative: 1 %
EOS PCT: 3 %
Eosinophils Absolute: 0.2 10*3/uL (ref 0.0–0.7)
HEMATOCRIT: 38.1 % (ref 36.0–46.0)
Hemoglobin: 11.5 g/dL — ABNORMAL LOW (ref 12.0–15.0)
LYMPHS PCT: 24 %
Lymphs Abs: 1.8 10*3/uL (ref 0.7–4.0)
MCH: 26.2 pg (ref 26.0–34.0)
MCHC: 30.2 g/dL (ref 30.0–36.0)
MCV: 86.8 fL (ref 78.0–100.0)
MONO ABS: 0.8 10*3/uL (ref 0.1–1.0)
MONOS PCT: 11 %
Neutro Abs: 4.5 10*3/uL (ref 1.7–7.7)
Neutrophils Relative %: 61 %
PLATELETS: 326 10*3/uL (ref 150–400)
RBC: 4.39 MIL/uL (ref 3.87–5.11)
RDW: 15.5 % (ref 11.5–15.5)
WBC: 7.3 10*3/uL (ref 4.0–10.5)

## 2017-10-03 LAB — BASIC METABOLIC PANEL
Anion gap: 9 (ref 5–15)
BUN: 15 mg/dL (ref 6–20)
CO2: 30 mmol/L (ref 22–32)
CREATININE: 0.84 mg/dL (ref 0.44–1.00)
Calcium: 9.5 mg/dL (ref 8.9–10.3)
Chloride: 99 mmol/L — ABNORMAL LOW (ref 101–111)
GFR calc Af Amer: >60 mL/min (ref >60)
GFR calc non Af Amer: >60 mL/min (ref >60)
GLUCOSE: 94 mg/dL (ref 65–99)
Potassium: 4.3 mmol/L (ref 3.5–5.1)
Sodium: 138 mmol/L (ref 135–145)

## 2017-10-08 ENCOUNTER — Ambulatory Visit (HOSPITAL_COMMUNITY): Payer: Medicare HMO | Admitting: Anesthesiology

## 2017-10-08 ENCOUNTER — Other Ambulatory Visit: Payer: Self-pay

## 2017-10-08 ENCOUNTER — Ambulatory Visit (HOSPITAL_COMMUNITY)
Admission: RE | Admit: 2017-10-08 | Discharge: 2017-10-08 | Disposition: A | Discharge status: Home / Self Care | Payer: Medicare HMO | Source: Ambulatory Visit | Attending: Internal Medicine | Admitting: Internal Medicine

## 2017-10-08 ENCOUNTER — Encounter (HOSPITAL_COMMUNITY)
Admission: RE | Disposition: A | Discharge status: Home / Self Care | Payer: Self-pay | Source: Ambulatory Visit | Attending: Internal Medicine

## 2017-10-08 ENCOUNTER — Encounter (HOSPITAL_COMMUNITY): Payer: Self-pay | Admitting: Emergency Medicine

## 2017-10-08 DIAGNOSIS — Z87891 Personal history of nicotine dependence: Secondary | ICD-10-CM | POA: Diagnosis not present

## 2017-10-08 DIAGNOSIS — K3189 Other diseases of stomach and duodenum: Secondary | ICD-10-CM

## 2017-10-08 DIAGNOSIS — D509 Iron deficiency anemia, unspecified: Secondary | ICD-10-CM | POA: Insufficient documentation

## 2017-10-08 DIAGNOSIS — K449 Diaphragmatic hernia without obstruction or gangrene: Secondary | ICD-10-CM | POA: Insufficient documentation

## 2017-10-08 DIAGNOSIS — J449 Chronic obstructive pulmonary disease, unspecified: Secondary | ICD-10-CM | POA: Insufficient documentation

## 2017-10-08 DIAGNOSIS — Z8719 Personal history of other diseases of the digestive system: Secondary | ICD-10-CM | POA: Insufficient documentation

## 2017-10-08 DIAGNOSIS — R195 Other fecal abnormalities: Secondary | ICD-10-CM

## 2017-10-08 DIAGNOSIS — F419 Anxiety disorder, unspecified: Secondary | ICD-10-CM | POA: Diagnosis not present

## 2017-10-08 DIAGNOSIS — Z79899 Other long term (current) drug therapy: Secondary | ICD-10-CM | POA: Diagnosis not present

## 2017-10-08 DIAGNOSIS — R933 Abnormal findings on diagnostic imaging of other parts of digestive tract: Secondary | ICD-10-CM

## 2017-10-08 DIAGNOSIS — K5730 Diverticulosis of large intestine without perforation or abscess without bleeding: Secondary | ICD-10-CM | POA: Diagnosis not present

## 2017-10-08 DIAGNOSIS — K210 Gastro-esophageal reflux disease with esophagitis: Secondary | ICD-10-CM | POA: Diagnosis not present

## 2017-10-08 DIAGNOSIS — I10 Essential (primary) hypertension: Secondary | ICD-10-CM | POA: Insufficient documentation

## 2017-10-08 DIAGNOSIS — D649 Anemia, unspecified: Secondary | ICD-10-CM

## 2017-10-08 DIAGNOSIS — E039 Hypothyroidism, unspecified: Secondary | ICD-10-CM | POA: Diagnosis not present

## 2017-10-08 DIAGNOSIS — K209 Esophagitis, unspecified: Secondary | ICD-10-CM

## 2017-10-08 DIAGNOSIS — K222 Esophageal obstruction: Secondary | ICD-10-CM | POA: Diagnosis not present

## 2017-10-08 DIAGNOSIS — K2950 Unspecified chronic gastritis without bleeding: Secondary | ICD-10-CM | POA: Diagnosis not present

## 2017-10-08 HISTORY — PX: BIOPSY: SHX5522

## 2017-10-08 HISTORY — PX: ESOPHAGOGASTRODUODENOSCOPY (EGD) WITH PROPOFOL: SHX5813

## 2017-10-08 HISTORY — PX: COLONOSCOPY WITH PROPOFOL: SHX5780

## 2017-10-08 SURGERY — COLONOSCOPY WITH PROPOFOL
Anesthesia: Monitor Anesthesia Care

## 2017-10-08 MED ORDER — LIDOCAINE VISCOUS 2 % MT SOLN: 5.0000 mL | Freq: Two times a day (BID) | OROMUCOSAL | Status: DC

## 2017-10-08 MED ORDER — CHLORHEXIDINE GLUCONATE CLOTH 2 % EX PADS: 6.0000 | MEDICATED_PAD | Freq: Once | CUTANEOUS | Status: DC

## 2017-10-08 MED ORDER — PROPOFOL 10 MG/ML IV BOLUS
INTRAVENOUS | Status: DC | PRN
  Administered 2017-10-08 (×4): 10 mg via INTRAVENOUS

## 2017-10-08 MED ORDER — LACTATED RINGERS IV SOLN
INTRAVENOUS | Status: DC
  Administered 2017-10-08: 11:00:00 via INTRAVENOUS

## 2017-10-08 MED ORDER — PROPOFOL 500 MG/50ML IV EMUL
INTRAVENOUS | Status: DC | PRN
  Administered 2017-10-08: 150 ug/kg/min via INTRAVENOUS

## 2017-10-08 MED ORDER — PROPOFOL 10 MG/ML IV BOLUS
INTRAVENOUS | Status: AC
  Filled 2017-10-08: qty 40

## 2017-10-08 NOTE — Anesthesia Postprocedure Evaluation (Signed)
Anesthesia Post Note  Patient: Marilyn Solomon  Procedure(s) Performed: COLONOSCOPY WITH PROPOFOL (N/A ) ESOPHAGOGASTRODUODENOSCOPY (EGD) WITH PROPOFOL (N/A ) BIOPSY  Patient location during evaluation: PACU Anesthesia Type: MAC Level of consciousness: awake and alert and oriented Pain management: pain level controlled Vital Signs Assessment: post-procedure vital signs reviewed and stable Respiratory status: spontaneous breathing Cardiovascular status: blood pressure returned to baseline and stable Postop Assessment: no apparent nausea or vomiting Anesthetic complications: no     Last Vitals:  Vitals:   10/08/17 1100 10/08/17 1330  BP:    Pulse:    Resp:    Temp:  (P) 36.6 C  SpO2: 93%     Last Pain:  Vitals:   10/08/17 1330  TempSrc:   PainSc: (P) Asleep                 Elveta Rape

## 2017-10-08 NOTE — Discharge Instructions (Signed)

## 2017-10-08 NOTE — Op Note (Signed)
[facility] Patient Name: Marilyn Solomon Procedure Date: 10/08/2017 11:43 AM MRN: [id number] Date of Birth: 08-30-1945 Attending MD: Norvel Richards , MD CSN: [id number] Age: 72 Admit Type: Outpatient Procedure:                Upper GI endoscopy Indications:              Abnormal CT of the GI tract (esophagus) Providers:                Norvel Richards, MD, Lurline Del, RN, Aram Candela Referring MD:             Elyn Peers MD Medicines:                Propofol per Anesthesia Complications:            No immediate complications. Estimated Blood Loss:     Estimated blood loss was minimal. Procedure:                Pre-Anesthesia Assessment:                           - Prior to the procedure, a History and Physical                            was performed, and patient medications and                            allergies were reviewed. The patient's tolerance of                            previous anesthesia was also reviewed. The risks                            and benefits of the procedure and the sedation                            options and risks were discussed with the patient.                            All questions were answered, and informed consent                            was obtained. Prior Anticoagulants: The patient has                            taken no previous anticoagulant or antiplatelet                            agents. ASA Grade Assessment: III - A patient with                            severe systemic disease. After reviewing the risks  and benefits, the patient was deemed in                            satisfactory condition to undergo the procedure.                           After obtaining informed consent, the endoscope was                            passed under direct vision. Throughout the                            procedure, the patient's blood pressure, pulse, and              oxygen saturations were monitored continuously. The                            [id number] ([id number]) scope was introduced through the                            and advanced to the second part of duodenum. The                            upper GI endoscopy was accomplished with ease. The                            patient tolerated the procedure well. Scope In: 12:51:47 PM Scope Out: 67:34:19 PM Total Procedure Duration: 0 hours 4 minutes 31 seconds  Findings:      Esophagitis was found. Small esophageal erosions straddling Schatzki's       ring..      A non-obstructing Schatzki ring was found at the gastroesophageal       junction.      A small hiatal hernia was present.      Diffuse moderately erythematous mucosa was found in the entire examined       stomach. Mottling of the mucosa present. No ulcer or infiltrating       process. This was biopsied with a cold forceps for histology. Estimated       blood loss was minimal.      The duodenal bulb and second portion of the duodenum were normal. Impression:               - . Mild erosive reflux esophagitis.                           - Non-obstructing Schatzki ring.                           - Small hiatal hernia.                           - Erythematous mucosa in the stomach. Biopsied.                           - Normal duodenal bulb and second portion of the  duodenum. Moderate Sedation:      Moderate (conscious) sedation was personally administered by an       anesthesia professional. The following parameters were monitored: oxygen       saturation, heart rate, blood pressure, respiratory rate, EKG, adequacy       of pulmonary ventilation, and response to care. Total physician       intraservice time was 9 minutes. Recommendation:           - Patient has a contact number available for                            emergencies. The signs and symptoms of potential                            delayed  complications were discussed with the                            patient. Return to normal activities tomorrow.                            Written discharge instructions were provided to the                            patient.                           - Resume previous diet.                           - Continue present medications. Begin Protonix 40                            mg daily as the benefits appear to outweigh the                            risk at this time                           - No repeat upper endoscopy.                           - Return to GI office (date not yet determined).                            see colonoscopy report. Procedure Code(s):        --- Professional ---                           [phone], Esophagogastroduodenoscopy, flexible,                            transoral; with biopsy, single or multiple Diagnosis Code(s):        --- Professional ---                           K20.9, Esophagitis, unspecified  K22.2, Esophageal obstruction                           K44.9, Diaphragmatic hernia without obstruction or                            gangrene                           K31.89, Other diseases of stomach and duodenum                           R93.3, Abnormal findings on diagnostic imaging of                            other parts of digestive tract CPT copyright 2017 American Medical Association. All rights reserved. The codes documented in this report are preliminary and upon coder review may  be revised to meet current compliance requirements. Cristopher Estimable. Carsin Randazzo, MD Norvel Richards, MD 10/08/2017 1:23:55 PM This report has been signed electronically. Number of Addenda: 0

## 2017-10-08 NOTE — H&P (Signed)
@LOGO @   Primary Care Physician:  Lucianne Lei, MD Primary Gastroenterologist:  Dr. Gala Romney  Pre-Procedure History & Physical: HPI:  Marilyn Solomon is a 72 y.o. female here for further evaluation of a thickened distal esophagus on CT and positive fecal DNA testing;  history of iron deficiency anemia.  Past Medical History:  Diagnosis Date  . Anemia   . Anxiety   . Arthritis   . Chronic back pain   . COPD (chronic obstructive pulmonary disease) ([place])   . GERD (gastroesophageal reflux disease)   . Hypertension   . Hypothyroidism   . Reflux    no medications currently, asymptomatic  . Thyroid disease     Past Surgical History:  Procedure Laterality Date  . ABDOMINAL HYSTERECTOMY    . cataracts Bilateral   . COLONOSCOPY  2007   Dr. Gala Romney: normal rectum, diverticula   . COLONOSCOPY WITH PROPOFOL N/A 11/15/2015   Dr. Gala Romney: grade II hemorrhoids, diverticulosis  . FRACTURE SURGERY Right 2005   Wrist  . JOINT REPLACEMENT Left 2000   hip  . TOTAL HIP ARTHROPLASTY Left   . WRIST SURGERY Right     Prior to Admission medications   Medication Sig Start Date End Date Taking? Authorizing Provider  ALPRAZolam Duanne Moron) 1 MG tablet Take 1 mg by mouth every 8 (eight) hours as needed for anxiety.  08/10/14  Yes [provider]  carvedilol (COREG) 6.25 MG tablet Take 6.25 mg by mouth 2 (two) times daily.  07/27/17  Yes [provider]  dextromethorphan (DELSYM) 30 MG/5ML liquid Take 15 mg by mouth 2 (two) times daily as needed for cough.   Yes [provider]  ibuprofen (ADVIL,MOTRIN) 200 MG tablet Take 200 mg by mouth daily as needed for headache or moderate pain.   Yes [provider]  levothyroxine (SYNTHROID, LEVOTHROID) 75 MCG tablet Take 75 mcg by mouth daily.     Yes [provider]  lisinopril-hydrochlorothiazide (PRINZIDE,ZESTORETIC) 20-12.5 MG tablet Take 1 tablet by mouth daily. 07/27/17  Yes [provider]  Multiple Vitamin  (MULTIVITAMIN WITH MINERALS) TABS tablet Take 1 tablet by mouth daily after supper.   Yes [provider]  oxyCODONE (ROXICODONE) 15 MG immediate release tablet Take 15 mg by mouth 3 (three) times daily as needed for pain.    Yes [provider]  polyethylene glycol-electrolytes (TRILYTE) 420 g solution Take 4,000 mLs by mouth as directed. 08/28/17  Yes RourkCristopher Estimable, MD  Vitamin D, Ergocalciferol, (DRISDOL) 50000 UNITS CAPS capsule Take 50,000 Units by mouth every Friday.  07/31/14  Yes [provider]    Allergies as of 08/28/2017 - Review Complete 08/21/2017  Allergen Reaction Noted  . Penicillins Itching 12/05/2013  . Vicodin [hydrocodone-acetaminophen] Nausea And Vomiting 04/17/2011    Family History  Problem Relation Age of Onset  . Bone cancer Sister   . Pancreatic cancer Sister   . Pneumonia Mother   . Heart attack Father   . Diabetes Sister   . Dementia Sister   . Colon cancer Neg Hx     Social History   Socioeconomic History  . Marital status: Widowed    Spouse name: Not on file  . Number of children: Not on file  . Years of education: Not on file  . Highest education level: Not on file  Occupational History  . Occupation: disability  Social Needs  . Financial resource strain: Not on file  . Food insecurity:    Worry: Not on file  Inability: Not on file  . Transportation needs:    Medical: Not on file    Non-medical: Not on file  Tobacco Use  . Smoking status: Former Smoker    Packs/day: 25.00    Years: 1.00    Pack years: 25.00    Types: Cigarettes    Last attempt to quit: 08/27/1998    Years since quitting: 19.1  . Smokeless tobacco: Never Used  . Tobacco comment: quit in 2000  Substance and Sexual Activity  . Alcohol use: No    Alcohol/week: 0.0 oz  . Drug use: No  . Sexual activity: Yes    Birth control/protection: Surgical  Lifestyle  . Physical activity:    Days per week: Not on file    Minutes per session: Not on  file  . Stress: Not on file  Relationships  . Social connections:    Talks on phone: Not on file    Gets together: Not on file    Attends religious service: Not on file    Active member of club or organization: Not on file    Attends meetings of clubs or organizations: Not on file    Relationship status: Not on file  . Intimate partner violence:    Fear of current or ex partner: Not on file    Emotionally abused: Not on file    Physically abused: Not on file    Forced sexual activity: Not on file  Other Topics Concern  . Not on file  Social History Narrative  . Not on file    Review of Systems: See HPI, otherwise negative ROS  Physical Exam: BP (!) 161/92   Pulse 100   Temp 98.1 F (36.7 C) (Oral)   Resp 20   SpO2 93%  General:   Alert,  Well-developed, well-nourished, pleasant and cooperative in NAD Neck:  Supple; no masses or thyromegaly. No significant cervical adenopathy. Lungs:  Clear throughout to auscultation.   No wheezes, crackles, or rhonchi. No acute distress. Heart:  Regular rate and rhythm; no murmurs, clicks, rubs,  or gallops. Abdomen: Non-distended, normal bowel sounds.  Soft and nontender without appreciable mass or hepatosplenomegaly.  Pulses:  Normal pulses noted. Extremities:  Without clubbing or edema.  Impression:   72 year old lady with abnormal distal esophagus on cross-sectional imaging, positive fecal DNA test and iron deficiency.   Patient denies dysphagia.    Recommendations:  I have offered the patient both an EGD and colonoscopy today.  The risks, benefits, limitations, imponderables and alternatives regarding both EGD and colonoscopy have been reviewed with the patient. Questions have been answered. All parties agreeable.        Notice: This dictation was prepared with Dragon dictation along with smaller phrase technology. Any transcriptional errors that result from this process are unintentional and may not be corrected upon  review.

## 2017-10-08 NOTE — Transfer of Care (Signed)
Immediate Anesthesia Transfer of Care Note  Patient: Marilyn Solomon  Procedure(s) Performed: COLONOSCOPY WITH PROPOFOL (N/A ) ESOPHAGOGASTRODUODENOSCOPY (EGD) WITH PROPOFOL (N/A ) BIOPSY  Patient Location: PACU  Anesthesia Type:MAC  Level of Consciousness: awake and alert   Airway & Oxygen Therapy: Patient Spontanous Breathing  Post-op Assessment: Report given to RN  Post vital signs: Reviewed and stable  Last Vitals:  Vitals Value Taken Time  BP 100/61 10/08/2017  1:30 PM  Temp    Pulse 92 10/08/2017  1:30 PM  Resp 23 10/08/2017  1:30 PM  SpO2 100 % 10/08/2017  1:30 PM  Vitals shown include unvalidated device data.  Last Pain:  Vitals:   10/08/17 1057  TempSrc: Oral  PainSc: 0-No pain         Complications: No apparent anesthesia complications

## 2017-10-08 NOTE — Anesthesia Preprocedure Evaluation (Signed)
Anesthesia Evaluation  Patient identified by MRN, date of birth, ID band Patient awake    Reviewed: Allergy & Precautions, H&P , NPO status , Patient's Chart, lab work & pertinent test results  Airway Mallampati: II  TM Distance: >3 FB Neck ROM: full    Dental no notable dental hx.    Pulmonary neg pulmonary ROS, pneumonia, COPD, former smoker,    Pulmonary exam normal breath sounds clear to auscultation       Cardiovascular Exercise Tolerance: Good hypertension, negative cardio ROS   Rhythm:regular Rate:Normal     Neuro/Psych Anxiety negative neurological ROS  negative psych ROS   GI/Hepatic negative GI ROS, Neg liver ROS, GERD  ,  Endo/Other  negative endocrine ROSHypothyroidism   Renal/GU negative Renal ROS  negative genitourinary   Musculoskeletal   Abdominal   Peds  Hematology negative hematology ROS (+) anemia ,   Anesthesia Other Findings   Reproductive/Obstetrics negative OB ROS                             Anesthesia Physical Anesthesia Plan  ASA: III  Anesthesia Plan: MAC   Post-op Pain Management:    Induction:   PONV Risk Score and Plan:   Airway Management Planned:   Additional Equipment:   Intra-op Plan:   Post-operative Plan:   Informed Consent: I have reviewed the patients History and Physical, chart, labs and discussed the procedure including the risks, benefits and alternatives for the proposed anesthesia with the patient or authorized representative who has indicated his/her understanding and acceptance.   Dental Advisory Given  Plan Discussed with: CRNA  Anesthesia Plan Comments:         Anesthesia Quick Evaluation

## 2017-10-08 NOTE — Op Note (Signed)
[facility] Patient Name: Marilyn Solomon Procedure Date: 10/08/2017 11:39 AM MRN: [id number] Date of Birth: [birth year] Attending MD: Norvel Richards , MD CSN: [id number] Age: 72 Admit Type: Outpatient Procedure:                Colonoscopy Indications:              Positive Cologuard test Providers:                Norvel Richards, MD, Lurline Del, RN, Aram Candela Referring MD:             Elyn Peers MD Medicines:                Propofol per Anesthesia Complications:            No immediate complications. Estimated Blood Loss:     Estimated blood loss: none. Procedure:                Pre-Anesthesia Assessment:                           - Prior to the procedure, a History and Physical                            was performed, and patient medications and                            allergies were reviewed. The patient's tolerance of                            previous anesthesia was also reviewed. The risks                            and benefits of the procedure and the sedation                            options and risks were discussed with the patient.                            All questions were answered, and informed consent                            was obtained. Prior Anticoagulants: The patient has                            taken no previous anticoagulant or antiplatelet                            agents. ASA Grade Assessment: II - A patient with                            mild systemic disease. After reviewing the risks  and benefits, the patient was deemed in                            satisfactory condition to undergo the procedure.                           After obtaining informed consent, the colonoscope                            was passed under direct vision. Throughout the                            procedure, the patient's blood pressure, pulse, and                            oxygen saturations  were monitored continuously. The                            EC-[id number] ([id number]) scope was introduced through                            the anus and advanced to the the cecum, identified                            by appendiceal orifice and ileocecal valve. The                            colonoscopy was performed without difficulty. The                            patient tolerated the procedure well. The quality                            of the bowel preparation was adequate. The                            ileocecal valve, appendiceal orifice, and rectum                            were photographed. The entire colon was well                            visualized. Scope In: 1:01:24 PM Scope Out: 1:21:10 PM Scope Withdrawal Time: 0 hours 10 minutes 49 seconds  Total Procedure Duration: 0 hours 19 minutes 46 seconds  Findings:      The perianal and digital rectal examinations were normal.      Scattered medium-mouthed diverticula were found in the sigmoid colon and       descending colon. Prominent vascular pattern, ascending segment. No       neoplastic or polypoid lesion seen.      The exam was otherwise without abnormality on direct examination.       Attempted to retroflin the rectum. Rectal vault too small to retroflex.       However, rectum seen well on?"face.. Impression:               -  Diverticulosis in the sigmoid colon and in the                            descending colon.                           - The examination was otherwise normal on direct                            and retroflexion views.                           - No specimens collected. I found no lesion to                            explain a positive fecal DNA test. Moderate Sedation:      Moderate (conscious) sedation was personally administered by an       anesthesia professional. The following parameters were monitored: oxygen       saturation, heart rate, blood pressure, respiratory rate, EKG, adequacy        of pulmonary ventilation, and response to care. Total physician       intraservice time was 34 minutes. Recommendation:           - Patient has a contact number available for                            emergencies. The signs and symptoms of potential                            delayed complications were discussed with the                            patient. Return to normal activities tomorrow.                            Written discharge instructions were provided to the                            patient.                           - Resume previous diet.                           - Continue present medications.                           - No Repeat colonoscopy due to age. see EGD report.                            Patent may need a capsule study to wrap up the                            evaluat Procedure Code(s):        --- Professional ---  45378, Colonoscopy, flexible; diagnostic, including                            collection of specimen(s) by brushing or washing,                            when performed (separate procedure) Diagnosis Code(s):        --- Professional ---                           R19.5, Other fecal abnormalities                           K57.30, Diverticulosis of large intestine without                            perforation or abscess without bleeding CPT copyright 2017 American Medical Association. All rights reserved. The codes documented in this report are preliminary and upon coder review may  be revised to meet current compliance requirements. Cristopher Estimable. Nicasio Barlowe, MD Norvel Richards, MD 10/08/2017 1:30:41 PM This report has been signed electronically. Number of Addenda: 0

## 2017-10-09 ENCOUNTER — Encounter: Payer: Self-pay | Admitting: Internal Medicine

## 2017-10-09 ENCOUNTER — Telehealth: Payer: Self-pay

## 2017-10-09 NOTE — Telephone Encounter (Signed)
Per RMR-  Send letter to patient.  Send copy of letter with path to referring provider and PCP.   Need to proceed with a small bowel capsule study for iron deficiency anemia.

## 2017-10-10 ENCOUNTER — Other Ambulatory Visit: Payer: Self-pay

## 2017-10-10 DIAGNOSIS — D509 Iron deficiency anemia, unspecified: Secondary | ICD-10-CM

## 2017-10-10 NOTE — Telephone Encounter (Signed)
Called and informed pt. Given Capsule Study scheduled for 11/19/17 at 7:30am. Instructions mailed. Orders entered.

## 2017-10-10 NOTE — Telephone Encounter (Signed)
Noted, letter mailed 

## 2017-10-10 NOTE — Telephone Encounter (Signed)
[employer] for [place]. Case pending. Ref# [id number]. Clinical notes faxed to [phone].

## 2017-10-11 ENCOUNTER — Encounter (HOSPITAL_COMMUNITY): Payer: Self-pay | Admitting: Internal Medicine

## 2017-10-17 NOTE — Telephone Encounter (Signed)
Received call from [facility] PA as been approved. #[id number]. Dates 11/19/17-12/18/17

## 2017-10-29 NOTE — Telephone Encounter (Addendum)
Called [place] and made aware. Will forward to LSL as an FYI. Patient was placed on AB schedule to read givens. Will forward to AB as well as an [place]

## 2017-10-29 NOTE — Telephone Encounter (Signed)
Patient called and stated she would like to cancel her Givens procedure.  She will call back later to reschedule.   Routing to [employer]

## 2017-10-31 NOTE — Telephone Encounter (Signed)
Save for RMR as he is the one who ordered the Givens.

## 2017-11-08 NOTE — Telephone Encounter (Signed)
Communication noted.  

## 2017-11-19 ENCOUNTER — Ambulatory Visit (HOSPITAL_COMMUNITY): Admit: 2017-11-19 | Payer: Medicare HMO | Admitting: Internal Medicine

## 2017-11-19 ENCOUNTER — Encounter (HOSPITAL_COMMUNITY): Payer: Self-pay

## 2017-11-19 SURGERY — IMAGING PROCEDURE, GI TRACT, INTRALUMINAL, VIA CAPSULE

## 2017-12-04 DIAGNOSIS — Z681 Body mass index (BMI) 19 or less, adult: Secondary | ICD-10-CM | POA: Diagnosis not present

## 2017-12-04 DIAGNOSIS — R7309 Other abnormal glucose: Secondary | ICD-10-CM | POA: Diagnosis not present

## 2017-12-04 DIAGNOSIS — F064 Anxiety disorder due to known physiological condition: Secondary | ICD-10-CM | POA: Diagnosis not present

## 2017-12-04 DIAGNOSIS — J449 Chronic obstructive pulmonary disease, unspecified: Secondary | ICD-10-CM | POA: Diagnosis not present

## 2017-12-04 DIAGNOSIS — M810 Age-related osteoporosis without current pathological fracture: Secondary | ICD-10-CM | POA: Diagnosis not present

## 2017-12-04 DIAGNOSIS — E039 Hypothyroidism, unspecified: Secondary | ICD-10-CM | POA: Diagnosis not present

## 2017-12-06 ENCOUNTER — Other Ambulatory Visit (HOSPITAL_COMMUNITY): Payer: Self-pay | Admitting: Family Medicine

## 2017-12-06 DIAGNOSIS — Z1231 Encounter for screening mammogram for malignant neoplasm of breast: Secondary | ICD-10-CM

## 2017-12-14 ENCOUNTER — Ambulatory Visit (HOSPITAL_COMMUNITY)
Admission: RE | Admit: 2017-12-14 | Discharge: 2017-12-14 | Disposition: A | Discharge status: Home / Self Care | Payer: Medicare HMO | Source: Ambulatory Visit | Attending: Family Medicine | Admitting: Family Medicine

## 2017-12-14 ENCOUNTER — Encounter (HOSPITAL_COMMUNITY): Payer: Self-pay

## 2017-12-14 DIAGNOSIS — Z1231 Encounter for screening mammogram for malignant neoplasm of breast: Secondary | ICD-10-CM | POA: Insufficient documentation

## 2017-12-19 ENCOUNTER — Other Ambulatory Visit (HOSPITAL_COMMUNITY): Payer: Self-pay | Admitting: Family Medicine

## 2017-12-19 DIAGNOSIS — E2839 Other primary ovarian failure: Secondary | ICD-10-CM

## 2017-12-19 DIAGNOSIS — Z1231 Encounter for screening mammogram for malignant neoplasm of breast: Secondary | ICD-10-CM

## 2018-01-31 ENCOUNTER — Encounter (HOSPITAL_COMMUNITY): Payer: Self-pay

## 2018-01-31 ENCOUNTER — Emergency Department (HOSPITAL_COMMUNITY): Payer: Medicare Other

## 2018-01-31 ENCOUNTER — Other Ambulatory Visit: Payer: Self-pay

## 2018-01-31 ENCOUNTER — Inpatient Hospital Stay (HOSPITAL_COMMUNITY)
Admission: EM | Admit: 2018-01-31 | Discharge: 2018-01-31 | DRG: 192 | Discharge status: Against Medical Advice | Payer: Medicare Other | Attending: Internal Medicine | Admitting: Internal Medicine

## 2018-01-31 DIAGNOSIS — Z885 Allergy status to narcotic agent status: Secondary | ICD-10-CM

## 2018-01-31 DIAGNOSIS — Z800 Family history of malignant neoplasm of digestive organs: Secondary | ICD-10-CM | POA: Diagnosis not present

## 2018-01-31 DIAGNOSIS — R0602 Shortness of breath: Secondary | ICD-10-CM | POA: Diagnosis not present

## 2018-01-31 DIAGNOSIS — Z87891 Personal history of nicotine dependence: Secondary | ICD-10-CM | POA: Diagnosis not present

## 2018-01-31 DIAGNOSIS — Z833 Family history of diabetes mellitus: Secondary | ICD-10-CM

## 2018-01-31 DIAGNOSIS — Z9841 Cataract extraction status, right eye: Secondary | ICD-10-CM

## 2018-01-31 DIAGNOSIS — E039 Hypothyroidism, unspecified: Secondary | ICD-10-CM | POA: Diagnosis present

## 2018-01-31 DIAGNOSIS — I10 Essential (primary) hypertension: Secondary | ICD-10-CM | POA: Diagnosis not present

## 2018-01-31 DIAGNOSIS — Z8249 Family history of ischemic heart disease and other diseases of the circulatory system: Secondary | ICD-10-CM

## 2018-01-31 DIAGNOSIS — Z880 Allergy status to penicillin: Secondary | ICD-10-CM

## 2018-01-31 DIAGNOSIS — R0902 Hypoxemia: Secondary | ICD-10-CM | POA: Diagnosis present

## 2018-01-31 DIAGNOSIS — Z9842 Cataract extraction status, left eye: Secondary | ICD-10-CM | POA: Diagnosis not present

## 2018-01-31 DIAGNOSIS — M1990 Unspecified osteoarthritis, unspecified site: Secondary | ICD-10-CM | POA: Diagnosis not present

## 2018-01-31 DIAGNOSIS — F419 Anxiety disorder, unspecified: Secondary | ICD-10-CM | POA: Diagnosis present

## 2018-01-31 DIAGNOSIS — R05 Cough: Secondary | ICD-10-CM | POA: Diagnosis not present

## 2018-01-31 DIAGNOSIS — Z96642 Presence of left artificial hip joint: Secondary | ICD-10-CM | POA: Diagnosis present

## 2018-01-31 DIAGNOSIS — J441 Chronic obstructive pulmonary disease with (acute) exacerbation: Principal | ICD-10-CM | POA: Diagnosis present

## 2018-01-31 DIAGNOSIS — Z90710 Acquired absence of both cervix and uterus: Secondary | ICD-10-CM

## 2018-01-31 DIAGNOSIS — R000 Tachycardia, unspecified: Secondary | ICD-10-CM | POA: Diagnosis present

## 2018-01-31 DIAGNOSIS — D649 Anemia, unspecified: Secondary | ICD-10-CM | POA: Diagnosis present

## 2018-01-31 DIAGNOSIS — Z79890 Hormone replacement therapy (postmenopausal): Secondary | ICD-10-CM | POA: Diagnosis not present

## 2018-01-31 DIAGNOSIS — K219 Gastro-esophageal reflux disease without esophagitis: Secondary | ICD-10-CM | POA: Diagnosis present

## 2018-01-31 DIAGNOSIS — D72829 Elevated white blood cell count, unspecified: Secondary | ICD-10-CM | POA: Diagnosis present

## 2018-01-31 LAB — COMPREHENSIVE METABOLIC PANEL
ALT: 17 U/L (ref 0–44)
ANION GAP: 10 (ref 5–15)
AST: 29 U/L (ref 15–41)
Albumin: 3.2 g/dL — ABNORMAL LOW (ref 3.5–5.0)
Alkaline Phosphatase: 86 U/L (ref 38–126)
BUN: 18 mg/dL (ref 8–23)
CHLORIDE: 100 mmol/L (ref 98–111)
CO2: 30 mmol/L (ref 22–32)
Calcium: 9 mg/dL (ref 8.9–10.3)
Creatinine, Ser: 0.95 mg/dL (ref 0.44–1.00)
GFR calc non Af Amer: 59 mL/min — ABNORMAL LOW (ref >60)
Glucose, Bld: 122 mg/dL — ABNORMAL HIGH (ref 70–99)
Potassium: 4.1 mmol/L (ref 3.5–5.1)
SODIUM: 140 mmol/L (ref 135–145)
Total Bilirubin: 0.9 mg/dL (ref 0.3–1.2)
Total Protein: 7.5 g/dL (ref 6.5–8.1)

## 2018-01-31 LAB — CBC WITH DIFFERENTIAL/PLATELET
BASOS PCT: 0 %
Basophils Absolute: 0 10*3/uL (ref 0.0–0.1)
EOS ABS: 0 10*3/uL (ref 0.0–0.7)
Eosinophils Relative: 0 %
HCT: 34.9 % — ABNORMAL LOW (ref 36.0–46.0)
HEMOGLOBIN: 10.6 g/dL — AB (ref 12.0–15.0)
LYMPHS ABS: 0.9 10*3/uL (ref 0.7–4.0)
Lymphocytes Relative: 8 %
MCH: 27.7 pg (ref 26.0–34.0)
MCHC: 30.4 g/dL (ref 30.0–36.0)
MCV: 91.1 fL (ref 78.0–100.0)
MONO ABS: 1.4 10*3/uL — AB (ref 0.1–1.0)
MONOS PCT: 11 %
NEUTROS PCT: 81 %
Neutro Abs: 9.8 10*3/uL — ABNORMAL HIGH (ref 1.7–7.7)
Platelets: 312 10*3/uL (ref 150–400)
RBC: 3.83 MIL/uL — ABNORMAL LOW (ref 3.87–5.11)
RDW: 14.1 % (ref 11.5–15.5)
WBC: 12.2 10*3/uL — ABNORMAL HIGH (ref 4.0–10.5)

## 2018-01-31 LAB — URINALYSIS, ROUTINE W REFLEX MICROSCOPIC
Bilirubin Urine: NEGATIVE
GLUCOSE, UA: NEGATIVE mg/dL
Ketones, ur: NEGATIVE mg/dL
NITRITE: NEGATIVE
PH: 5 (ref 5.0–8.0)
Protein, ur: 30 mg/dL — AB
SPECIFIC GRAVITY, URINE: 1.017 (ref 1.005–1.030)

## 2018-01-31 LAB — I-STAT CG4 LACTIC ACID, ED: LACTIC ACID, VENOUS: 1.67 mmol/L (ref 0.5–1.9)

## 2018-01-31 MED ORDER — SODIUM CHLORIDE 0.9 % IV BOLUS
1000.0000 mL | Freq: Once | INTRAVENOUS | Status: AC
  Administered 2018-01-31: 1000 mL via INTRAVENOUS

## 2018-01-31 MED ORDER — IPRATROPIUM-ALBUTEROL 0.5-2.5 (3) MG/3ML IN SOLN
3.0000 mL | Freq: Once | RESPIRATORY_TRACT | Status: AC
  Administered 2018-01-31: 3 mL via RESPIRATORY_TRACT
  Filled 2018-01-31: qty 3

## 2018-01-31 MED ORDER — ALBUTEROL SULFATE (2.5 MG/3ML) 0.083% IN NEBU
2.5000 mg | INHALATION_SOLUTION | Freq: Once | RESPIRATORY_TRACT | Status: AC
  Administered 2018-01-31: 2.5 mg via RESPIRATORY_TRACT
  Filled 2018-01-31: qty 3

## 2018-01-31 MED ORDER — ONDANSETRON HCL 4 MG/2ML IJ SOLN: 4.0000 mg | Freq: Four times a day (QID) | INTRAMUSCULAR | Status: DC | PRN

## 2018-01-31 MED ORDER — ACETAMINOPHEN 650 MG RE SUPP: 650.0000 mg | Freq: Four times a day (QID) | RECTAL | Status: DC | PRN

## 2018-01-31 MED ORDER — LEVOFLOXACIN IN D5W 500 MG/100ML IV SOLN
500.0000 mg | Freq: Once | INTRAVENOUS | Status: AC
  Administered 2018-01-31: 500 mg via INTRAVENOUS
  Filled 2018-01-31: qty 100

## 2018-01-31 MED ORDER — ONDANSETRON HCL 4 MG PO TABS: 4.0000 mg | ORAL_TABLET | Freq: Four times a day (QID) | ORAL | Status: DC | PRN

## 2018-01-31 MED ORDER — ALBUTEROL SULFATE (2.5 MG/3ML) 0.083% IN NEBU: 2.5000 mg | INHALATION_SOLUTION | RESPIRATORY_TRACT | Status: DC | PRN

## 2018-01-31 MED ORDER — ENOXAPARIN SODIUM 40 MG/0.4ML ~~LOC~~ SOLN: 40.0000 mg | SUBCUTANEOUS | Status: DC

## 2018-01-31 MED ORDER — LEVOTHYROXINE SODIUM 50 MCG PO TABS: 75.0000 ug | ORAL_TABLET | Freq: Every day | ORAL | Status: DC

## 2018-01-31 MED ORDER — PANTOPRAZOLE SODIUM 40 MG PO TBEC: 40.0000 mg | DELAYED_RELEASE_TABLET | Freq: Every day | ORAL | Status: DC

## 2018-01-31 MED ORDER — PREDNISONE 20 MG PO TABS: 40.0000 mg | ORAL_TABLET | Freq: Every day | ORAL | Status: DC

## 2018-01-31 MED ORDER — ACETAMINOPHEN 325 MG PO TABS: 650.0000 mg | ORAL_TABLET | Freq: Four times a day (QID) | ORAL | Status: DC | PRN

## 2018-01-31 MED ORDER — SENNA 8.6 MG PO TABS
1.0000 | ORAL_TABLET | Freq: Two times a day (BID) | ORAL | Status: DC
  Filled 2018-01-31 (×3): qty 1

## 2018-01-31 MED ORDER — DOXYCYCLINE HYCLATE 100 MG PO TABS: 100.0000 mg | ORAL_TABLET | Freq: Two times a day (BID) | ORAL | Status: DC

## 2018-01-31 MED ORDER — METHYLPREDNISOLONE SODIUM SUCC 40 MG IJ SOLR: 40.0000 mg | Freq: Two times a day (BID) | INTRAMUSCULAR | Status: DC

## 2018-01-31 MED ORDER — ALPRAZOLAM 0.5 MG PO TABS: 1.0000 mg | ORAL_TABLET | Freq: Three times a day (TID) | ORAL | Status: DC | PRN

## 2018-01-31 MED ORDER — OXYCODONE HCL 5 MG PO TABS: 15.0000 mg | ORAL_TABLET | Freq: Three times a day (TID) | ORAL | Status: DC | PRN

## 2018-01-31 MED ORDER — IPRATROPIUM-ALBUTEROL 0.5-2.5 (3) MG/3ML IN SOLN: 3.0000 mL | Freq: Four times a day (QID) | RESPIRATORY_TRACT | Status: DC

## 2018-01-31 MED ORDER — DOCUSATE SODIUM 100 MG PO CAPS: 100.0000 mg | ORAL_CAPSULE | Freq: Two times a day (BID) | ORAL | Status: DC

## 2018-01-31 NOTE — ED Provider Notes (Addendum)
[facility] EMERGENCY DEPARTMENT Provider Note   CSN: [id number] Arrival date & time: 01/31/18  1036     History   Chief Complaint Chief Complaint  Patient presents with  . Nausea  . Cough    HPI Marilyn Solomon is a 72 y.o. female.  Patient complains of cough yellow sputum production and shortness of breath.  Patient has history of COPD.  The history is provided by the patient. No language interpreter was used.  Cough  This is a new problem. The current episode started 12 to 24 hours ago. The problem occurs constantly. The problem has not changed since onset.The cough is productive of sputum. There has been no fever. Associated symptoms include shortness of breath. Pertinent negatives include no chest pain and no headaches. She has tried nothing for the symptoms. The treatment provided no relief. Risk factors: History of COPD. She is not a smoker. Her past medical history does not include pneumonia.    Past Medical History:  Diagnosis Date  . Anemia   . Anxiety   . Arthritis   . Chronic back pain   . COPD (chronic obstructive pulmonary disease) ([place])   . GERD (gastroesophageal reflux disease)   . Hypertension   . Hypothyroidism   . Reflux    no medications currently, asymptomatic  . Thyroid disease     Patient Active Problem List   Diagnosis Date Noted  . Positive colorectal cancer screening using Cologuard test 08/21/2017  . Normocytic anemia   . Anxiety 06/30/2017  . Hypothyroidism 06/30/2017  . GERD (gastroesophageal reflux disease) 06/30/2017  . COPD (chronic obstructive pulmonary disease) ([place]) 06/30/2017  . Elevated liver enzymes 06/30/2017  . Severe Iron deficiency anemia 06/30/2017  . Pneumonia 06/28/2017  . CAP (community acquired pneumonia) 06/28/2017  . Second degree hemorrhoids   . Encounter for screening colonoscopy 10/28/2015    Past Surgical History:  Procedure Laterality Date  . ABDOMINAL HYSTERECTOMY    . BIOPSY  10/08/2017   Procedure:  BIOPSY;  Surgeon: Daneil Dolin, MD;  [facility];  Service: Endoscopy;;  gastric  . cataracts Bilateral   . COLONOSCOPY  2007   Dr. Gala Romney: normal rectum, diverticula   . COLONOSCOPY WITH PROPOFOL N/A 11/15/2015   Dr. Gala Romney: grade II hemorrhoids, diverticulosis  . COLONOSCOPY WITH PROPOFOL N/A 10/08/2017   Procedure: COLONOSCOPY WITH PROPOFOL;  Surgeon: Daneil Dolin, MD;  [facility];  Service: Endoscopy;  Laterality: N/A;  11:15am  . ESOPHAGOGASTRODUODENOSCOPY (EGD) WITH PROPOFOL N/A 10/08/2017   Procedure: ESOPHAGOGASTRODUODENOSCOPY (EGD) WITH PROPOFOL;  Surgeon: Daneil Dolin, MD;  [facility];  Service: Endoscopy;  Laterality: N/A;  . FRACTURE SURGERY Right 2005   Wrist  . JOINT REPLACEMENT Left 2000   hip  . TOTAL HIP ARTHROPLASTY Left   . WRIST SURGERY Right      OB History   None      Home Medications    Prior to Admission medications   Medication Sig Start Date End Date Taking? Authorizing Provider  ALPRAZolam Duanne Moron) 1 MG tablet Take 1 mg by mouth every 8 (eight) hours as needed for anxiety.  08/10/14  Yes [provider]  oxyCODONE (ROXICODONE) 15 MG immediate release tablet Take 15 mg by mouth 3 (three) times daily as needed for pain.    Yes [provider]  carvedilol (COREG) 6.25 MG tablet Take 6.25 mg by mouth 2 (two) times daily.  07/27/17   [provider]  levothyroxine (  SYNTHROID, LEVOTHROID) 75 MCG tablet Take 75 mcg by mouth daily.      [provider]  lisinopril-hydrochlorothiazide (PRINZIDE,ZESTORETIC) 20-12.5 MG tablet Take 1 tablet by mouth daily. 07/27/17   [provider]  Multiple Vitamin (MULTIVITAMIN WITH MINERALS) TABS tablet Take 1 tablet by mouth daily after supper.    [provider]  pantoprazole (PROTONIX) 40 MG tablet Take 1 tablet by mouth daily. 01/14/18   [provider]  Vitamin D, Ergocalciferol, (DRISDOL) 50000 UNITS CAPS capsule Take 50,000  Units by mouth every Friday.  07/31/14   [provider]    Family History Family History  Problem Relation Age of Onset  . Bone cancer Sister   . Pancreatic cancer Sister   . Pneumonia Mother   . Heart attack Father   . Diabetes Sister   . Dementia Sister   . Colon cancer Neg Hx     Social History Social History   Tobacco Use  . Smoking status: Former Smoker    Packs/day: 25.00    Years: 1.00    Pack years: 25.00    Types: Cigarettes    Last attempt to quit: 08/27/1998    Years since quitting: 19.4  . Smokeless tobacco: Never Used  . Tobacco comment: quit in 2000  Substance Use Topics  . Alcohol use: No    Alcohol/week: 0.0 standard drinks  . Drug use: No     Allergies   Penicillins and Vicodin [hydrocodone-acetaminophen]   Review of Systems Review of Systems  Constitutional: Negative for appetite change and fatigue.  HENT: Negative for congestion, ear discharge and sinus pressure.   Eyes: Negative for discharge.  Respiratory: Positive for cough and shortness of breath.   Cardiovascular: Negative for chest pain.  Gastrointestinal: Negative for abdominal pain and diarrhea.  Genitourinary: Negative for frequency and hematuria.  Musculoskeletal: Negative for back pain.  Skin: Negative for rash.  Neurological: Negative for seizures and headaches.  Psychiatric/Behavioral: Negative for hallucinations.     Physical Exam Updated Vital Signs BP (!) 100/58   Pulse (!) 110   Temp 99 F (37.2 C) (Oral)   Resp (!) 24   Ht 5\' 5"  (1.651 m)   Wt 48.5 kg   SpO2 95%   BMI 17.81 kg/m   Physical Exam  Constitutional: She is oriented to person, place, and time. She appears well-developed.  HENT:  Head: Normocephalic.  Eyes: Conjunctivae and EOM are normal. No scleral icterus.  Neck: Neck supple. No thyromegaly present.  Cardiovascular: Normal rate and regular rhythm. Exam reveals no gallop and no friction rub.  No murmur heard. Pulmonary/Chest: No  stridor. She has wheezes. She has no rales. She exhibits no tenderness.  Abdominal: She exhibits no distension. There is no tenderness. There is no rebound.  Musculoskeletal: Normal range of motion. She exhibits no edema.  Lymphadenopathy:    She has no cervical adenopathy.  Neurological: She is oriented to person, place, and time. She exhibits normal muscle tone. Coordination normal.  Skin: No rash noted. No erythema.  Psychiatric: She has a normal mood and affect. Her behavior is normal.     ED Treatments / Results  Labs (all labs ordered are listed, but only abnormal results are displayed) Labs Reviewed  COMPREHENSIVE METABOLIC PANEL - Abnormal; Notable for the following components:      Result Value   Glucose, Bld 122 (*)    Albumin 3.2 (*)    GFR calc non Af Amer 59 (*)    All  other components within normal limits  CBC WITH DIFFERENTIAL/PLATELET - Abnormal; Notable for the following components:   WBC 12.2 (*)    RBC 3.83 (*)    Hemoglobin 10.6 (*)    HCT 34.9 (*)    Neutro Abs 9.8 (*)    Monocytes Absolute 1.4 (*)    All other components within normal limits  URINALYSIS, ROUTINE W REFLEX MICROSCOPIC - Abnormal; Notable for the following components:   APPearance HAZY (*)    Hgb urine dipstick SMALL (*)    Protein, ur 30 (*)    Leukocytes, UA TRACE (*)    Bacteria, UA RARE (*)    All other components within normal limits  CULTURE, BLOOD (ROUTINE X 2)  CULTURE, BLOOD (ROUTINE X 2)  I-STAT CG4 LACTIC ACID, ED    EKG None  Radiology Dg Chest 2 View  Result Date: 01/31/2018 CLINICAL DATA:  Nausea and productive cough since Monday. EXAM: CHEST - 2 VIEW COMPARISON:  06/28/2017, 06/30/2017 CT FINDINGS: The lungs are hyperinflated. There are coarse reticular markings throughout the lungs with overall stable pattern. There are no new consolidations. No evidence for pulmonary edema. There are small bilateral pleural effusions. Perihilar peribronchial thickening. IMPRESSION:  Hyperinflation and chronic interstitial changes. No evidence for acute abnormality. Electronically Signed   By: Nolon Nations M.D.   On: 01/31/2018 13:34    Procedures Procedures (including critical care time)  Medications Ordered in ED Medications  sodium chloride 0.9 % bolus 1,000 mL (0 mLs Intravenous Stopped 01/31/18 1324)  levofloxacin (LEVAQUIN) IVPB 500 mg (0 mg Intravenous Stopped 01/31/18 1324)  ipratropium-albuterol (DUONEB) 0.5-2.5 (3) MG/3ML nebulizer solution 3 mL (3 mLs Nebulization Given 01/31/18 1203)  albuterol (PROVENTIL) (2.5 MG/3ML) 0.083% nebulizer solution 2.5 mg (2.5 mg Nebulization Given 01/31/18 1203)  ipratropium-albuterol (DUONEB) 0.5-2.5 (3) MG/3ML nebulizer solution 3 mL (3 mLs Nebulization Given 01/31/18 1428)  albuterol (PROVENTIL) (2.5 MG/3ML) 0.083% nebulizer solution 2.5 mg (2.5 mg Nebulization Given 01/31/18 1428)     Initial Impression / Assessment and Plan / ED Course  I have reviewed the triage vital signs and the nursing notes.  Pertinent labs & imaging results that were available during my care of the patient were reviewed by me and considered in my medical decision making (see chart for details).     CRITICAL CARE Performed by: Milton Ferguson Total critical care time: 35 minutes Critical care time was exclusive of separately billable procedures and treating other patients. Critical care was necessary to treat or prevent imminent or life-threatening deterioration. Critical care was time spent personally by me on the following activities: development of treatment plan with patient and/or surrogate as well as nursing, discussions with consultants, evaluation of patient's response to treatment, examination of patient, obtaining history from patient or surrogate, ordering and performing treatments and interventions, ordering and review of laboratory studies, ordering and review of radiographic studies, pulse oximetry and re-evaluation of patient's  condition.  Patient with COPD exacerbation and hypoxia with bronchitis.  She will be admitted to medicine,     patient was seen by the medical doctor and the patient decided she did not want to stay in the hospital she left [place].  I spoke to her and told her that she was very sick and if she went home she could end up getting a severe infection and dying but the patient refused to be admitted to the hospital. Final Clinical Impressions(s) / ED Diagnoses   Final diagnoses:  COPD exacerbation ([place])  ED Discharge Orders    None       Milton Ferguson, MD 01/31/18 1444    Milton Ferguson, MD 01/31/18 1505

## 2018-01-31 NOTE — Progress Notes (Signed)
hospitalist is called to admit this patient but she refused to stay. I informed Dr. Roderic Palau who went to check on patient at the bedside and recommended patient to stay. Pt insisted leaving AMA as she needs to take care of her 71 y.o. Granddaughter and does not want to consider alternative ways. She asked for antibiotics and cough meds but per policy of AMA, pt will not get prescription if he/she leaves AMA. Dr. Roderic Palau cleared stated this to patient and she understood.

## 2018-01-31 NOTE — ED Notes (Signed)
Patient has declined the admission recommendation and is leaving against medical advice.

## 2018-01-31 NOTE — ED Triage Notes (Signed)
Pt reports nausea and cough since Monday.  Denies diarrhea.  Pt says cough productive at times with thick yellow sputum.  Pt alert and oriented.  LBM was yesterday and was normal per pt.

## 2018-02-04 DIAGNOSIS — R0981 Nasal congestion: Secondary | ICD-10-CM | POA: Diagnosis not present

## 2018-02-04 DIAGNOSIS — Z681 Body mass index (BMI) 19 or less, adult: Secondary | ICD-10-CM | POA: Diagnosis not present

## 2018-02-05 LAB — CULTURE, BLOOD (ROUTINE X 2)
Culture: NO GROWTH
Culture: NO GROWTH
SPECIAL REQUESTS: ADEQUATE
Special Requests: ADEQUATE

## 2018-05-07 DIAGNOSIS — Z681 Body mass index (BMI) 19 or less, adult: Secondary | ICD-10-CM | POA: Diagnosis not present

## 2018-05-07 DIAGNOSIS — J440 Chronic obstructive pulmonary disease with acute lower respiratory infection: Secondary | ICD-10-CM | POA: Diagnosis not present

## 2018-05-07 DIAGNOSIS — E038 Other specified hypothyroidism: Secondary | ICD-10-CM | POA: Diagnosis not present

## 2018-05-07 DIAGNOSIS — G8929 Other chronic pain: Secondary | ICD-10-CM | POA: Diagnosis not present

## 2018-05-07 DIAGNOSIS — E039 Hypothyroidism, unspecified: Secondary | ICD-10-CM | POA: Diagnosis not present

## 2018-05-07 DIAGNOSIS — Z0000 Encounter for general adult medical examination without abnormal findings: Secondary | ICD-10-CM | POA: Diagnosis not present

## 2018-05-23 DIAGNOSIS — M8589 Other specified disorders of bone density and structure, multiple sites: Secondary | ICD-10-CM | POA: Diagnosis not present

## 2018-05-23 DIAGNOSIS — Z96642 Presence of left artificial hip joint: Secondary | ICD-10-CM | POA: Diagnosis not present

## 2018-05-23 DIAGNOSIS — M810 Age-related osteoporosis without current pathological fracture: Secondary | ICD-10-CM | POA: Diagnosis not present

## 2018-05-23 DIAGNOSIS — Z8262 Family history of osteoporosis: Secondary | ICD-10-CM | POA: Diagnosis not present

## 2018-05-23 DIAGNOSIS — Z90710 Acquired absence of both cervix and uterus: Secondary | ICD-10-CM | POA: Diagnosis not present

## 2018-05-25 ENCOUNTER — Other Ambulatory Visit: Payer: Self-pay | Admitting: Internal Medicine

## 2018-06-02 ENCOUNTER — Encounter (HOSPITAL_COMMUNITY): Payer: Self-pay | Admitting: Emergency Medicine

## 2018-06-02 ENCOUNTER — Other Ambulatory Visit: Payer: Self-pay

## 2018-06-02 ENCOUNTER — Observation Stay (HOSPITAL_COMMUNITY)
Admission: EM | Admit: 2018-06-02 | Discharge: 2018-06-04 | Disposition: A | Discharge status: Home / Self Care | Payer: Medicare HMO | Attending: Internal Medicine | Admitting: Internal Medicine

## 2018-06-02 ENCOUNTER — Emergency Department (HOSPITAL_COMMUNITY): Payer: Medicare HMO

## 2018-06-02 DIAGNOSIS — R0602 Shortness of breath: Secondary | ICD-10-CM | POA: Diagnosis not present

## 2018-06-02 DIAGNOSIS — I10 Essential (primary) hypertension: Secondary | ICD-10-CM | POA: Insufficient documentation

## 2018-06-02 DIAGNOSIS — R0603 Acute respiratory distress: Secondary | ICD-10-CM | POA: Diagnosis not present

## 2018-06-02 DIAGNOSIS — K219 Gastro-esophageal reflux disease without esophagitis: Secondary | ICD-10-CM | POA: Diagnosis not present

## 2018-06-02 DIAGNOSIS — J441 Chronic obstructive pulmonary disease with (acute) exacerbation: Principal | ICD-10-CM | POA: Insufficient documentation

## 2018-06-02 DIAGNOSIS — E039 Hypothyroidism, unspecified: Secondary | ICD-10-CM | POA: Diagnosis not present

## 2018-06-02 DIAGNOSIS — F419 Anxiety disorder, unspecified: Secondary | ICD-10-CM | POA: Insufficient documentation

## 2018-06-02 DIAGNOSIS — R000 Tachycardia, unspecified: Secondary | ICD-10-CM | POA: Diagnosis not present

## 2018-06-02 DIAGNOSIS — J8410 Pulmonary fibrosis, unspecified: Secondary | ICD-10-CM | POA: Diagnosis not present

## 2018-06-02 DIAGNOSIS — J9600 Acute respiratory failure, unspecified whether with hypoxia or hypercapnia: Secondary | ICD-10-CM | POA: Diagnosis not present

## 2018-06-02 DIAGNOSIS — D649 Anemia, unspecified: Secondary | ICD-10-CM | POA: Diagnosis not present

## 2018-06-02 LAB — CBC WITH DIFFERENTIAL/PLATELET
Abs Immature Granulocytes: 0.03 10*3/uL (ref 0.00–0.07)
Basophils Absolute: 0 10*3/uL (ref 0.0–0.1)
Basophils Relative: 1 %
Eosinophils Absolute: 0.7 10*3/uL — ABNORMAL HIGH (ref 0.0–0.5)
Eosinophils Relative: 9 %
HCT: 36.7 % (ref 36.0–46.0)
HEMOGLOBIN: 10.4 g/dL — AB (ref 12.0–15.0)
Immature Granulocytes: 0 %
LYMPHS PCT: 18 %
Lymphs Abs: 1.5 10*3/uL (ref 0.7–4.0)
MCH: 23.7 pg — ABNORMAL LOW (ref 26.0–34.0)
MCHC: 28.3 g/dL — ABNORMAL LOW (ref 30.0–36.0)
MCV: 83.8 fL (ref 80.0–100.0)
MONO ABS: 0.8 10*3/uL (ref 0.1–1.0)
Monocytes Relative: 10 %
Neutro Abs: 4.9 10*3/uL (ref 1.7–7.7)
Neutrophils Relative %: 62 %
Platelets: 382 10*3/uL (ref 150–400)
RBC: 4.38 MIL/uL (ref 3.87–5.11)
RDW: 14.6 % (ref 11.5–15.5)
WBC: 7.9 10*3/uL (ref 4.0–10.5)
nRBC: 0 % (ref 0.0–0.2)

## 2018-06-02 LAB — COMPREHENSIVE METABOLIC PANEL
ALT: 15 U/L (ref 0–44)
AST: 31 U/L (ref 15–41)
Albumin: 3.7 g/dL (ref 3.5–5.0)
Alkaline Phosphatase: 80 U/L (ref 38–126)
Anion gap: 8 (ref 5–15)
BUN: 15 mg/dL (ref 8–23)
CO2: 28 mmol/L (ref 22–32)
Calcium: 9.3 mg/dL (ref 8.9–10.3)
Chloride: 101 mmol/L (ref 98–111)
Creatinine, Ser: 0.99 mg/dL (ref 0.44–1.00)
GFR calc Af Amer: >60 mL/min (ref >60)
GFR calc non Af Amer: 57 mL/min — ABNORMAL LOW (ref >60)
Glucose, Bld: 124 mg/dL — ABNORMAL HIGH (ref 70–99)
Potassium: 5 mmol/L (ref 3.5–5.1)
Sodium: 137 mmol/L (ref 135–145)
Total Bilirubin: 0.9 mg/dL (ref 0.3–1.2)
Total Protein: 8.1 g/dL (ref 6.5–8.1)

## 2018-06-02 LAB — BRAIN NATRIURETIC PEPTIDE: B Natriuretic Peptide: 26 pg/mL (ref 0.0–100.0)

## 2018-06-02 MED ORDER — ALBUTEROL SULFATE (2.5 MG/3ML) 0.083% IN NEBU: 2.5000 mg | INHALATION_SOLUTION | RESPIRATORY_TRACT | Status: DC | PRN

## 2018-06-02 MED ORDER — IPRATROPIUM-ALBUTEROL 0.5-2.5 (3) MG/3ML IN SOLN
3.0000 mL | Freq: Four times a day (QID) | RESPIRATORY_TRACT | Status: DC
  Administered 2018-06-03 – 2018-06-04 (×7): 3 mL via RESPIRATORY_TRACT
  Filled 2018-06-02 (×7): qty 3

## 2018-06-02 MED ORDER — ALPRAZOLAM 1 MG PO TABS: 1.0000 mg | ORAL_TABLET | Freq: Three times a day (TID) | ORAL | Status: DC | PRN

## 2018-06-02 MED ORDER — PREDNISONE 20 MG PO TABS: 40.0000 mg | ORAL_TABLET | Freq: Every day | ORAL | Status: DC

## 2018-06-02 MED ORDER — IPRATROPIUM-ALBUTEROL 0.5-2.5 (3) MG/3ML IN SOLN
3.0000 mL | Freq: Once | RESPIRATORY_TRACT | Status: AC
  Administered 2018-06-02: 3 mL via RESPIRATORY_TRACT
  Filled 2018-06-02: qty 3

## 2018-06-02 MED ORDER — ACETAMINOPHEN 650 MG RE SUPP: 650.0000 mg | Freq: Four times a day (QID) | RECTAL | Status: DC | PRN

## 2018-06-02 MED ORDER — LEVOTHYROXINE SODIUM 75 MCG PO TABS
75.0000 ug | ORAL_TABLET | Freq: Every day | ORAL | Status: DC
  Administered 2018-06-04: 75 ug via ORAL
  Filled 2018-06-02: qty 1

## 2018-06-02 MED ORDER — DOXYCYCLINE HYCLATE 100 MG PO TABS
100.0000 mg | ORAL_TABLET | Freq: Once | ORAL | Status: AC
  Administered 2018-06-02: 100 mg via ORAL
  Filled 2018-06-02: qty 1

## 2018-06-02 MED ORDER — ENOXAPARIN SODIUM 30 MG/0.3ML ~~LOC~~ SOLN
30.0000 mg | SUBCUTANEOUS | Status: DC
  Administered 2018-06-02 – 2018-06-03 (×2): 30 mg via SUBCUTANEOUS
  Filled 2018-06-02 (×2): qty 0.3

## 2018-06-02 MED ORDER — OXYCODONE HCL 5 MG PO TABS: 15.0000 mg | ORAL_TABLET | Freq: Three times a day (TID) | ORAL | Status: DC | PRN

## 2018-06-02 MED ORDER — ALBUTEROL (5 MG/ML) CONTINUOUS INHALATION SOLN
10.0000 mg/h | INHALATION_SOLUTION | RESPIRATORY_TRACT | Status: AC
  Administered 2018-06-02: 10 mg/h via RESPIRATORY_TRACT
  Filled 2018-06-02: qty 20

## 2018-06-02 MED ORDER — METHYLPREDNISOLONE SODIUM SUCC 40 MG IJ SOLR
40.0000 mg | Freq: Four times a day (QID) | INTRAMUSCULAR | Status: DC
  Administered 2018-06-02 – 2018-06-03 (×3): 40 mg via INTRAVENOUS
  Filled 2018-06-02 (×3): qty 1

## 2018-06-02 MED ORDER — ENOXAPARIN SODIUM 40 MG/0.4ML ~~LOC~~ SOLN: 40.0000 mg | SUBCUTANEOUS | Status: DC

## 2018-06-02 MED ORDER — PREDNISONE 50 MG PO TABS
60.0000 mg | ORAL_TABLET | Freq: Once | ORAL | Status: AC
  Administered 2018-06-02: 60 mg via ORAL
  Filled 2018-06-02: qty 1

## 2018-06-02 MED ORDER — ACETAMINOPHEN 325 MG PO TABS: 650.0000 mg | ORAL_TABLET | Freq: Four times a day (QID) | ORAL | Status: DC | PRN

## 2018-06-02 MED ORDER — PANTOPRAZOLE SODIUM 40 MG PO TBEC
40.0000 mg | DELAYED_RELEASE_TABLET | Freq: Every day | ORAL | Status: DC
  Administered 2018-06-03 – 2018-06-04 (×2): 40 mg via ORAL
  Filled 2018-06-02 (×2): qty 1

## 2018-06-02 NOTE — Progress Notes (Signed)
Patient incentive in room , will start with neb treatment , holding in er 1

## 2018-06-02 NOTE — ED Triage Notes (Signed)
Pt reports sob and dry cough for past two days.

## 2018-06-02 NOTE — ED Provider Notes (Addendum)
[facility] EMERGENCY DEPARTMENT Provider Note   CSN: [id number] Arrival date & time: 06/02/18  [place]     History   Chief Complaint Chief Complaint  Patient presents with  . Shortness of Breath    HPI DEZIRAE SERVICE is a 73 y.o. female with a history significant of COPD, htn presenting to the emergency department today with the chief complaint of shortness of breath  X 3 days.The shortness of breath is worse with activity. She has used her inhalers and nebulizer more frequently since the onset of her symptoms with minimal results. She was visiting a family member today and they thought she looked to have difficulty breathing so they checked her O2 stats and it was in the low 80s.Pt admits to associated non productive cough.  Pt does not wear O2 at home.  Denies fever, palpitations, chest pain, nausea, vomiting, diarrhea, lower extremity edema.    Past Medical History:  Diagnosis Date  . Anemia   . Anxiety   . Arthritis   . Chronic back pain   . COPD (chronic obstructive pulmonary disease) ([place])   . GERD (gastroesophageal reflux disease)   . Hypertension   . Hypothyroidism   . Reflux    no medications currently, asymptomatic  . Thyroid disease     Patient Active Problem List   Diagnosis Date Noted  . COPD exacerbation ([place]) 06/02/2018  . Anemia 06/02/2018  . Hypertension 06/02/2018  . COPD with acute exacerbation ([place]) 01/31/2018  . Tachycardia 01/31/2018  . Hypoxia 01/31/2018  . Leukocytosis 01/31/2018  . Positive colorectal cancer screening using Cologuard test 08/21/2017  . Normocytic anemia   . Anxiety 06/30/2017  . Hypothyroidism 06/30/2017  . GERD (gastroesophageal reflux disease) 06/30/2017  . COPD (chronic obstructive pulmonary disease) ([place]) 06/30/2017  . Elevated liver enzymes 06/30/2017  . Severe Iron deficiency anemia 06/30/2017  . Pneumonia 06/28/2017  . CAP (community acquired pneumonia) 06/28/2017  . Second degree hemorrhoids   . Encounter  for screening colonoscopy 10/28/2015    Past Surgical History:  Procedure Laterality Date  . ABDOMINAL HYSTERECTOMY    . BIOPSY  10/08/2017   Procedure: BIOPSY;  Surgeon: Daneil Dolin, MD;  [facility];  Service: Endoscopy;;  gastric  . cataracts Bilateral   . COLONOSCOPY  2007   Dr. Gala Romney: normal rectum, diverticula   . COLONOSCOPY WITH PROPOFOL N/A 11/15/2015   Dr. Gala Romney: grade II hemorrhoids, diverticulosis  . COLONOSCOPY WITH PROPOFOL N/A 10/08/2017   Procedure: COLONOSCOPY WITH PROPOFOL;  Surgeon: Daneil Dolin, MD;  [facility];  Service: Endoscopy;  Laterality: N/A;  11:15am  . ESOPHAGOGASTRODUODENOSCOPY (EGD) WITH PROPOFOL N/A 10/08/2017   Procedure: ESOPHAGOGASTRODUODENOSCOPY (EGD) WITH PROPOFOL;  Surgeon: Daneil Dolin, MD;  [facility];  Service: Endoscopy;  Laterality: N/A;  . FRACTURE SURGERY Right 2005   Wrist  . JOINT REPLACEMENT Left 2000   hip  . TOTAL HIP ARTHROPLASTY Left   . WRIST SURGERY Right      OB History   No obstetric history on file.      Home Medications    Prior to Admission medications   Medication Sig Start Date End Date Taking? Authorizing Provider  ALPRAZolam Duanne Moron) 1 MG tablet Take 1 mg by mouth every 8 (eight) hours as needed for anxiety.  08/10/14  Yes [provider]  levothyroxine (SYNTHROID, LEVOTHROID) 75 MCG tablet Take 75 mcg by mouth daily.     Yes [provider]  Multiple Vitamin (  MULTIVITAMIN WITH MINERALS) TABS tablet Take 1 tablet by mouth daily after supper.   Yes [provider]  oxyCODONE (ROXICODONE) 15 MG immediate release tablet Take 15 mg by mouth 3 (three) times daily as needed for pain.    Yes [provider]  pantoprazole (PROTONIX) 40 MG tablet TAKE 1 TABLET BY MOUTH DAILY. Patient taking differently: Take 40 mg by mouth daily.  05/27/18  Yes Annitta Needs, NP  Vitamin D, Ergocalciferol, (DRISDOL) 50000 UNITS CAPS capsule Take 50,000 Units by  mouth every Friday.  07/31/14  Yes [provider]    Family History Family History  Problem Relation Age of Onset  . Bone cancer Sister   . Pancreatic cancer Sister   . Pneumonia Mother   . Heart attack Father   . Diabetes Sister   . Dementia Sister   . Colon cancer Neg Hx     Social History Social History   Tobacco Use  . Smoking status: Former Smoker    Packs/day: 25.00    Years: 1.00    Pack years: 25.00    Types: Cigarettes    Last attempt to quit: 08/27/1998    Years since quitting: 19.7  . Smokeless tobacco: Never Used  . Tobacco comment: quit in 2000  Substance Use Topics  . Alcohol use: No    Alcohol/week: 0.0 standard drinks  . Drug use: No     Allergies   Penicillins and Vicodin [hydrocodone-acetaminophen]   Review of Systems Review of Systems  Constitutional: Negative for chills and fever.  HENT: Negative for congestion, sinus pressure and sore throat.   Eyes: Negative for pain and visual disturbance.  Respiratory: Positive for cough and wheezing. Negative for chest tightness and shortness of breath.   Cardiovascular: Negative for chest pain, palpitations and leg swelling.  Gastrointestinal: Negative for abdominal pain, diarrhea, nausea and vomiting.  Genitourinary: Negative for difficulty urinating and hematuria.  Musculoskeletal: Negative for back pain and neck pain.  Skin: Negative for rash and wound.  Neurological: Negative for syncope and headaches.     Physical Exam Updated Vital Signs BP (!) 174/88   Pulse 96   Temp 98.8 F (37.1 C) (Oral)   Resp 18   Ht 5\' 5"  (1.651 m)   Wt 48.5 kg   SpO2 96%   BMI 17.81 kg/m   Physical Exam Vitals signs and nursing note reviewed.  Constitutional:      General: She is not in acute distress.    Appearance: She is not ill-appearing.     Comments: Pt able to speak in full sentences. No accessory muscle use. No pursed lip breathing.   HENT:     Head: Normocephalic and atraumatic.      Nose: Nose normal.     Mouth/Throat:     Mouth: Mucous membranes are moist.     Pharynx: Oropharynx is clear.  Eyes:     Conjunctiva/sclera: Conjunctivae normal.  Neck:     Musculoskeletal: Normal range of motion.  Cardiovascular:     Rate and Rhythm: Regular rhythm. Tachycardia present.     Pulses: Normal pulses.     Heart sounds: Normal heart sounds.  Pulmonary:     Effort: Pulmonary effort is normal.     Breath sounds: Examination of the right-upper field reveals wheezing. Examination of the left-upper field reveals wheezing. Examination of the right-middle field reveals wheezing. Examination of the left-middle field reveals wheezing. Wheezing present.     Comments: Diminished breath sounds in lower  lung field bilaterally.  Chest:     Chest wall: No tenderness.  Abdominal:     General: There is no distension.     Palpations: Abdomen is soft.     Tenderness: There is no abdominal tenderness. There is no guarding or rebound.  Musculoskeletal: Normal range of motion.     Left lower leg: No edema.  Skin:    General: Skin is warm and dry.     Capillary Refill: Capillary refill takes less than 2 seconds.  Neurological:     Mental Status: She is alert and oriented to person, place, and time. Mental status is at baseline.     Motor: No weakness.  Psychiatric:        Mood and Affect: Mood normal.        Behavior: Behavior normal.        Judgment: Judgment normal.      ED Treatments / Results  Labs (all labs ordered are listed, but only abnormal results are displayed) Labs Reviewed  COMPREHENSIVE METABOLIC PANEL - Abnormal; Notable for the following components:      Result Value   Glucose, Bld 124 (*)    GFR calc non Af Amer 57 (*)    All other components within normal limits  CBC WITH DIFFERENTIAL/PLATELET - Abnormal; Notable for the following components:   Hemoglobin 10.4 (*)    MCH 23.7 (*)    MCHC 28.3 (*)    Eosinophils Absolute 0.7 (*)    All other components within  normal limits  BRAIN NATRIURETIC PEPTIDE  CBC  BASIC METABOLIC PANEL    EKG EKG Interpretation  Date/Time:  Sunday June 02 2018 17:29:36 EST Ventricular Rate:  100 PR Interval:    QRS Duration: 94 QT Interval:  359 QTC Calculation: 463 R Axis:   78 Text Interpretation:  Sinus tachycardia Right atrial enlargement Baseline wander in lead(s) I aVR Since last tracing rate slower Confirmed by Noemi Chapel [phone]) on 06/02/2018 5:41:29 PM   Radiology Dg Chest 2 View  Result Date: 06/02/2018 CLINICAL DATA:  Short of breath and dry cough for the past 2 days. History of COPD. EXAM: CHEST - 2 VIEW COMPARISON:  01/31/2018 FINDINGS: Cardiac silhouette is normal in size. No mediastinal or hilar masses. Lungs are hyperexpanded with chronic fibrotic changes stable from prior study. No evidence of pneumonia or pulmonary edema. No pleural effusion or pneumothorax. No acute skeletal abnormality IMPRESSION: 1. No acute cardiopulmonary disease. 2. COPD and lung fibrosis stable from the prior study. Electronically Signed   By: Lajean Manes M.D.   On: 06/02/2018 18:42    Procedures .Critical Care Performed by: Cherre Robins, PA-C Authorized by: Cherre Robins, PA-C   Critical care provider statement:    Critical care time (minutes):  45   Critical care was necessary to treat or prevent imminent or life-threatening deterioration of the following conditions:  Respiratory failure   Critical care was time spent personally by me on the following activities:  Discussions with consultants, evaluation of patient's response to treatment, examination of patient, ordering and performing treatments and interventions, ordering and review of laboratory studies, ordering and review of radiographic studies, pulse oximetry, re-evaluation of patient's condition, obtaining history from patient or surrogate and review of old charts   (including critical care time)  Medications Ordered in ED Medications    albuterol (PROVENTIL,VENTOLIN) solution continuous neb (10 mg/hr Nebulization New Bag/Given 06/02/18 1805)  methylPREDNISolone sodium succinate (SOLU-MEDROL) 40 mg/mL injection 40 mg (40 mg  Intravenous Given 06/02/18 2211)    Followed by  predniSONE (DELTASONE) tablet 40 mg (has no administration in time range)  enoxaparin (LOVENOX) injection 30 mg (30 mg Subcutaneous Given 06/02/18 2211)  acetaminophen (TYLENOL) tablet 650 mg (has no administration in time range)    Or  acetaminophen (TYLENOL) suppository 650 mg (has no administration in time range)  ipratropium-albuterol (DUONEB) 0.5-2.5 (3) MG/3ML nebulizer solution 3 mL (has no administration in time range)  albuterol (PROVENTIL) (2.5 MG/3ML) 0.083% nebulizer solution 2.5 mg (has no administration in time range)  ALPRAZolam (XANAX) tablet 1 mg (has no administration in time range)  levothyroxine (SYNTHROID, LEVOTHROID) tablet 75 mcg (75 mcg Oral Not Given 06/02/18 2150)  oxyCODONE (Oxy IR/ROXICODONE) immediate release tablet 15 mg (has no administration in time range)  pantoprazole (PROTONIX) EC tablet 40 mg (40 mg Oral Not Given 06/02/18 2151)  ipratropium-albuterol (DUONEB) 0.5-2.5 (3) MG/3ML nebulizer solution 3 mL (3 mLs Nebulization Given 06/02/18 1805)  predniSONE (DELTASONE) tablet 60 mg (60 mg Oral Given 06/02/18 1753)  doxycycline (VIBRA-TABS) tablet 100 mg (100 mg Oral Given 06/02/18 2211)     Initial Impression / Assessment and Plan / ED Course  I have reviewed the triage vital signs and the nursing notes.  Pertinent labs & imaging results that were available during my care of the patient were reviewed by me and considered in my medical decision making (see chart for details).  Clinical Course as of Jun 03 26  Nancy Fetter Jun 02, 2018  1805 O2 stats on room air dropped to 80% while ambulating around the nursing station with the nurse. Her HR was 118. This is before duoneb in an effort to get her baseline O2 saturation    [KA]    Clinical  Course User Index [KA] Albrizze, Kaitlyn E, PA-C   CXR with stable lung fibrosis and COPD compared to previous studies.Lab work today show no leukocytosis, normal renal function. Pt admits to feeling minimal improvement in her shortness of breath after the nebulizer. On repeat exam wheezing still present in upper bilateral lung fields and diminished in the bases bilaterally. Her treatment in the ED included 1 hour continuous neb, oral prednisone. After the treatment she was ambulated on room air with portable O2 monitor and her O2 saturation dropped to 85%, even further when she was getting into the bed it dropped to 82%. She was given one dose of doxycycline in the ED to begin her treatment of COPD exacerbation.  Pt is agreeable to admission. Dr. Olevia Bowens with [facility] will admit.  Final Clinical Impressions(s) / ED Diagnoses   Final diagnoses:  Respiratory distress  COPD exacerbation [facility])    ED Discharge Orders    None       Cherre Robins, PA-C 06/03/18 0025    Albrizze, Kramer, PA-C 06/03/18 Shelah Lewandowsky    Noemi Chapel, MD 06/03/18 [phone]

## 2018-06-02 NOTE — ED Notes (Signed)
Ambulated pt on room air,02 level start at 93%, walked full lap around nursing station 02 level ended at 85%.

## 2018-06-02 NOTE — ED Provider Notes (Signed)
Medical screening examination/treatment/procedure(s) were conducted as a shared visit with non-physician practitioner(s) and myself.  I personally evaluated the patient during the encounter.  The patient is a 73 year old female, very pleasant, presents to the hospital today with a complaint of shortness of breath accompanied by her family member who the patient states that the primary reason that she came.  The patient states she has had some increasing shortness of breath, it is unclear exactly when it started but sometime between the last couple of days to a week.  She has had some increasing coughing but minimal, no significant phlegm production, no fevers, no swelling of the legs.  On my exam the patient is in only minimal increased work of breathing, she has diffuse expiratory wheezing in all lung fields but is able to speak essentially in normal full-length sentences.  She does not have any pursed lip breathing or accessory muscle use.  We will obtain a chest x-ray, treat for COPD type exacerbation.  The patient will get some continuous nebulizer therapy, steroids and hopefully discharge if she improves.  Initial vital signs suggested hypoxia tachypnea but no fever.    I discussed the case with Dr. Olevia Bowens of the hospitalist service who has been kind enough to admit.  After a continuous nebulizer the patient still became hypoxic on ambulation and required admission to the hospital.  CRITICAL CARE Performed by: Johnna Acosta Total critical care time: 35 minutes Critical care time was exclusive of separately billable procedures and treating other patients. Critical care was necessary to treat or prevent imminent or life-threatening deterioration. Critical care was time spent personally by me on the following activities: development of treatment plan with patient and/or surrogate as well as nursing, discussions with consultants, evaluation of patient's response to treatment, examination of  patient, obtaining history from patient or surrogate, ordering and performing treatments and interventions, ordering and review of laboratory studies, ordering and review of radiographic studies, pulse oximetry and re-evaluation of patient's condition.    EKG Interpretation  Date/Time:  Sunday June 02 2018 17:29:36 EST Ventricular Rate:  100 PR Interval:    QRS Duration: 94 QT Interval:  359 QTC Calculation: 463 R Axis:   78 Text Interpretation:  Sinus tachycardia Right atrial enlargement Baseline wander in lead(s) I aVR Since last tracing rate slower Confirmed by Noemi Chapel [phone]) on 06/02/2018 5:41:29 PM      Final diagnoses:  Respiratory distress  COPD exacerbation (HCC)       Noemi Chapel, MD 06/03/18 1746

## 2018-06-02 NOTE — ED Notes (Signed)
Resp paged for breathing treatment.  

## 2018-06-02 NOTE — H&P (Signed)
History and Physical    Marilyn Solomon NFA:[id number] DOB: [birth year] DOA: 06/02/2018  PCP: Lucianne Lei, MD   Patient coming from: Home.  I have personally briefly reviewed patient's old medical records in [place]  Chief Complaint: Shortness of breath and dry cough.  HPI: Marilyn Solomon is a 73 y.o. female with medical history significant of anemia, anxiety, osteoarthritis, chronic back pain, COPD, GERD, hypertension, hypothyroidism who is coming to the emergency department with complaints of progressively worse dyspnea, wheezing and dry cough for the past 2 days.  She denies fever, but complains of mild lightheadedness, chills and fatigue.  No rhinorrhea, sore throat, hemoptysis, chest pain, palpitations, diaphoresis, PND, orthopnea or pitting edema lower extremities.  She denies abdominal pain, nausea, emesis, diarrhea, constipation, melena or hematochezia.  No dysuria, frequency or hematuria.  Denies polyuria, polydipsia, polyphagia or blurred vision.  ED Course: Initial vital signs temperature 98 F, pulse 113, respirations 24, blood pressure 189/91 mmHg and O2 sat 86% on room air.  The patient received a DuoNeb and prednisone 60 mg p.o. x1 in the emergency department.  White count was 7.9, hemoglobin 10.4 g/dL and platelets 382.  CMP shows a glucose of 124 mg/dL, but all other values are within normal limits.  BNP was 26.0 pg/mL.  Her chest radiograph did not show any acute cardiopulmonary disease.  There was COPD and lung fibrosis stable from previous study.  Please see images and full radiology report for further detail.  Review of Systems: As per HPI otherwise 10 point review of systems negative.   Past Medical History:  Diagnosis Date  . Anemia   . Anxiety   . Arthritis   . Chronic back pain   . COPD (chronic obstructive pulmonary disease) ([place])   . GERD (gastroesophageal reflux disease)   . Hypertension   . Hypothyroidism   . Reflux    no medications  currently, asymptomatic  . Thyroid disease     Past Surgical History:  Procedure Laterality Date  . ABDOMINAL HYSTERECTOMY    . BIOPSY  10/08/2017   Procedure: BIOPSY;  Surgeon: Daneil Dolin, MD;  [facility];  Service: Endoscopy;;  gastric  . cataracts Bilateral   . COLONOSCOPY  2007   Dr. Gala Romney: normal rectum, diverticula   . COLONOSCOPY WITH PROPOFOL N/A 11/15/2015   Dr. Gala Romney: grade II hemorrhoids, diverticulosis  . COLONOSCOPY WITH PROPOFOL N/A 10/08/2017   Procedure: COLONOSCOPY WITH PROPOFOL;  Surgeon: Daneil Dolin, MD;  [facility];  Service: Endoscopy;  Laterality: N/A;  11:15am  . ESOPHAGOGASTRODUODENOSCOPY (EGD) WITH PROPOFOL N/A 10/08/2017   Procedure: ESOPHAGOGASTRODUODENOSCOPY (EGD) WITH PROPOFOL;  Surgeon: Daneil Dolin, MD;  Location[facility];  Service: Endoscopy;  Laterality: N/A;  . FRACTURE SURGERY Right 2005   Wrist  . JOINT REPLACEMENT Left 2000   hip  . TOTAL HIP ARTHROPLASTY Left   . WRIST SURGERY Right      reports that she quit smoking about 19 years ago. Her smoking use included cigarettes. She has a 25.00 pack-year smoking history. She has never used smokeless tobacco. She reports that she does not drink alcohol or use drugs.  Allergies  Allergen Reactions  . Penicillins Itching    Has patient had a PCN reaction causing immediate rash, facial/tongue/throat swelling, SOB or lightheadedness with hypotension: no Has patient had a PCN reaction causing severe rash involving mucus membranes or skin necrosis: No Has patient had a PCN reaction that required hospitalization  No Has patient had a PCN reaction occurring within the last 10 years: No If all of the above answers are "NO", then may proceed with Cephalosporin use.   . Vicodin [Hydrocodone-Acetaminophen] Nausea And Vomiting    Family History  Problem Relation Age of Onset  . Bone cancer Sister   . Pancreatic cancer Sister   . Pneumonia Mother   . Heart attack  Father   . Diabetes Sister   . Dementia Sister   . Colon cancer Neg Hx    Prior to Admission medications   Medication Sig Start Date End Date Taking? Authorizing Provider  ALPRAZolam Duanne Moron) 1 MG tablet Take 1 mg by mouth every 8 (eight) hours as needed for anxiety.  08/10/14   [provider]  carvedilol (COREG) 6.25 MG tablet Take 6.25 mg by mouth 2 (two) times daily.  07/27/17   [provider]  levothyroxine (SYNTHROID, LEVOTHROID) 75 MCG tablet Take 75 mcg by mouth daily.      [provider]  lisinopril-hydrochlorothiazide (PRINZIDE,ZESTORETIC) 20-12.5 MG tablet Take 1 tablet by mouth daily. 07/27/17   [provider]  Multiple Vitamin (MULTIVITAMIN WITH MINERALS) TABS tablet Take 1 tablet by mouth daily after supper.    [provider]  oxyCODONE (ROXICODONE) 15 MG immediate release tablet Take 15 mg by mouth 3 (three) times daily as needed for pain.     [provider]  pantoprazole (PROTONIX) 40 MG tablet TAKE 1 TABLET BY MOUTH DAILY. 05/27/18   Annitta Needs, NP  Vitamin D, Ergocalciferol, (DRISDOL) 50000 UNITS CAPS capsule Take 50,000 Units by mouth every Friday.  07/31/14   [provider]    Physical Exam: Vitals:   06/02/18 2015 06/02/18 2030 06/02/18 2045 06/02/18 2100  BP:  132/69  124/63  Pulse: (!) 109 (!) 110 (!) 103 (!) 105  Resp:      Temp:      TempSrc:      SpO2:      Weight:      Height:        Constitutional: NAD, calm, comfortable Eyes: PERRL, lids and conjunctivae normal ENMT: Mucous membranes are mildly dry. Posterior pharynx clear of any exudate or lesions. Neck: normal, supple, no masses, no thyromegaly Respiratory: Decreased breath sounds with wheezing and mild rhonchi bilaterally, no crackles. Normal respiratory effort. No accessory muscle use.  Cardiovascular: Tachycardic at 105 bpm, no murmurs / rubs / gallops. No extremity edema. 2+ pedal pulses. No carotid bruits.  Abdomen: no tenderness,  no masses palpated. No hepatosplenomegaly. Bowel sounds positive.  Musculoskeletal: no clubbing / cyanosis.  Good ROM, no contractures. Normal muscle tone.  Skin: no rashes, lesions, ulcers. No induration on limited dermatological examination. Neurologic: CN 2-12 grossly intact. Sensation intact, DTR normal. Strength 5/5 in all 4.  Psychiatric: Normal judgment and insight. Alert and oriented x 3. Normal mood.   Labs on Admission: I have personally reviewed following labs and imaging studies  CBC: Recent Labs  Lab 06/02/18 1731  WBC 7.9  NEUTROABS 4.9  HGB 10.4*  HCT 36.7  MCV 83.8  PLT 314   Basic Metabolic Panel: Recent Labs  Lab 06/02/18 1731  NA 137  K 5.0  CL 101  CO2 28  GLUCOSE 124*  BUN 15  CREATININE 0.99  CALCIUM 9.3   GFR: Estimated Creatinine Clearance: 39.9 mL/min (by C-G formula based on SCr of 0.99 mg/dL). Liver Function Tests: Recent Labs  Lab 06/02/18 1731  AST 31  ALT 15  ALKPHOS 80  BILITOT 0.9  PROT 8.1  ALBUMIN 3.7   No results for input(s): LIPASE, AMYLASE in the last 168 hours. No results for input(s): AMMONIA in the last 168 hours. Coagulation Profile: No results for input(s): INR, PROTIME in the last 168 hours. Cardiac Enzymes: No results for input(s): CKTOTAL, CKMB, CKMBINDEX, TROPONINI in the last 168 hours. BNP (last 3 results) No results for input(s): PROBNP in the last 8760 hours. HbA1C: No results for input(s): HGBA1C in the last 72 hours. CBG: No results for input(s): GLUCAP in the last 168 hours. Lipid Profile: No results for input(s): CHOL, HDL, LDLCALC, TRIG, CHOLHDL, LDLDIRECT in the last 72 hours. Thyroid Function Tests: No results for input(s): TSH, T4TOTAL, FREET4, T3FREE, THYROIDAB in the last 72 hours. Anemia Panel: No results for input(s): VITAMINB12, FOLATE, FERRITIN, TIBC, IRON, RETICCTPCT in the last 72 hours. Urine analysis:  Radiological Exams on Admission: Dg Chest 2 View  Result Date:  06/02/2018 CLINICAL DATA:  Short of breath and dry cough for the past 2 days. History of COPD. EXAM: CHEST - 2 VIEW COMPARISON:  01/31/2018 FINDINGS: Cardiac silhouette is normal in size. No mediastinal or hilar masses. Lungs are hyperexpanded with chronic fibrotic changes stable from prior study. No evidence of pneumonia or pulmonary edema. No pleural effusion or pneumothorax. No acute skeletal abnormality IMPRESSION: 1. No acute cardiopulmonary disease. 2. COPD and lung fibrosis stable from the prior study. Electronically Signed   By: Lajean Manes M.D.   On: 06/02/2018 18:42    EKG: Independently reviewed.  Vent. rate 100 BPM PR interval * ms QRS duration 94 ms QT/QTc 359/463 ms P-R-T axes 81 78 69 Sinus tachycardia Right atrial enlargement Baseline wander in lead(s) I aVR Since last tracing rate slower  Assessment/Plan Principal Problem:   COPD exacerbation (HCC) Observation/telemetry. Continue supplemental oxygen. DuoNeb every 6 hours. Albuterol neb every 4 hours as needed. Continue glucocorticoids.  Active Problems:   Anxiety Continue alprazolam as needed.    Hypothyroidism Continue levothyroxine 75 mcg p.o. daily. TSH follow-up as needed or per PCP.    GERD (gastroesophageal reflux disease) Continue Protonix 40 mg p.o. daily.    Anemia Monitor hematocrit and hemoglobin. Check anemia panel.    Hypertension Not on antihypertensives at this time. Monitor BP.   DVT prophylaxis: Lovenox SQ. Code Status: Full code. Family Communication: Disposition Plan: 24 to 48 hours observation for COPD exacerbation treatment. Consults called: Admission status: Observation/telemetry.   Reubin Milan MD [facility]  If 7PM-7AM, please contact night-coverage [email or web address] Password [facility]  06/02/2018, 9:14 PM

## 2018-06-02 NOTE — ED Notes (Signed)
Patient ambulated around desk on room air. O2 sat 80% and HR 119 after ambulation. Patient SOB with exertion. EDP aware.

## 2018-06-03 DIAGNOSIS — J441 Chronic obstructive pulmonary disease with (acute) exacerbation: Secondary | ICD-10-CM | POA: Diagnosis not present

## 2018-06-03 LAB — CBC
HCT: 34.3 % — ABNORMAL LOW (ref 36.0–46.0)
Hemoglobin: 9.8 g/dL — ABNORMAL LOW (ref 12.0–15.0)
MCH: 23.8 pg — ABNORMAL LOW (ref 26.0–34.0)
MCHC: 28.6 g/dL — ABNORMAL LOW (ref 30.0–36.0)
MCV: 83.5 fL (ref 80.0–100.0)
Platelets: 314 10*3/uL (ref 150–400)
RBC: 4.11 MIL/uL (ref 3.87–5.11)
RDW: 14.6 % (ref 11.5–15.5)
WBC: 4.7 10*3/uL (ref 4.0–10.5)
nRBC: 0 % (ref 0.0–0.2)

## 2018-06-03 LAB — FERRITIN: Ferritin: 26 ng/mL (ref 11–307)

## 2018-06-03 LAB — IRON AND TIBC
Iron: 12 ug/dL — ABNORMAL LOW (ref 28–170)
SATURATION RATIOS: 3 % — AB (ref 10.4–31.8)
TIBC: 396 ug/dL (ref 250–450)
UIBC: 384 ug/dL

## 2018-06-03 LAB — BASIC METABOLIC PANEL
Anion gap: 8 (ref 5–15)
BUN: 15 mg/dL (ref 8–23)
CO2: 27 mmol/L (ref 22–32)
Calcium: 9.4 mg/dL (ref 8.9–10.3)
Chloride: 102 mmol/L (ref 98–111)
Creatinine, Ser: 0.89 mg/dL (ref 0.44–1.00)
GFR calc Af Amer: >60 mL/min (ref >60)
Glucose, Bld: 183 mg/dL — ABNORMAL HIGH (ref 70–99)
Potassium: 4 mmol/L (ref 3.5–5.1)
Sodium: 137 mmol/L (ref 135–145)

## 2018-06-03 LAB — RETICULOCYTES
Immature Retic Fract: 13.7 % (ref 2.3–15.9)
RBC.: 4.11 MIL/uL (ref 3.87–5.11)
Retic Count, Absolute: 57.5 10*3/uL (ref 19.0–186.0)
Retic Ct Pct: 1.4 % (ref 0.4–3.1)

## 2018-06-03 LAB — VITAMIN B12: Vitamin B-12: 380 pg/mL (ref 180–914)

## 2018-06-03 LAB — FOLATE: Folate: 16.7 ng/mL (ref >5.9)

## 2018-06-03 MED ORDER — SODIUM CHLORIDE 0.9 % IV SOLN
INTRAVENOUS | Status: DC | PRN
  Administered 2018-06-03: 10 mL via INTRAVENOUS

## 2018-06-03 MED ORDER — BUDESONIDE 0.25 MG/2ML IN SUSP
0.2500 mg | Freq: Two times a day (BID) | RESPIRATORY_TRACT | Status: DC
  Administered 2018-06-03 – 2018-06-04 (×2): 0.25 mg via RESPIRATORY_TRACT
  Filled 2018-06-03 (×2): qty 2

## 2018-06-03 MED ORDER — GUAIFENESIN-DM 100-10 MG/5ML PO SYRP
5.0000 mL | ORAL_SOLUTION | ORAL | Status: DC | PRN
  Administered 2018-06-03: 5 mL via ORAL
  Filled 2018-06-03: qty 5

## 2018-06-03 MED ORDER — SODIUM CHLORIDE 0.9 % IV SOLN
510.0000 mg | Freq: Once | INTRAVENOUS | Status: DC
  Filled 2018-06-03: qty 17

## 2018-06-03 MED ORDER — METHYLPREDNISOLONE SODIUM SUCC 40 MG IJ SOLR
40.0000 mg | Freq: Two times a day (BID) | INTRAMUSCULAR | Status: DC
  Administered 2018-06-03 – 2018-06-04 (×2): 40 mg via INTRAVENOUS
  Filled 2018-06-03 (×2): qty 1

## 2018-06-03 MED ORDER — MAGNESIUM SULFATE 2 GM/50ML IV SOLN
2.0000 g | Freq: Once | INTRAVENOUS | Status: AC
  Administered 2018-06-03: 2 g via INTRAVENOUS
  Filled 2018-06-03: qty 50

## 2018-06-03 NOTE — ED Notes (Signed)
ED TO INPATIENT HANDOFF REPORT  Name/Age/Gender Marilyn Solomon 73 y.o. female  Code Status    Code Status Orders  (From admission, onward)         Start     Ordered   06/02/18 2102  Full code  Continuous     06/02/18 2103        Code Status History    Date Active Date Inactive Code Status Order ID Comments User Context   06/02/2018 2101 06/02/2018 2103 Full Code [id number]  Reubin Milan, MD ED   01/31/2018 1454 01/31/2018 1512 Full Code [id number]  Paticia Stack, MD ED   06/28/2017 2322 07/01/2017 1656 Full Code [id number]  Jani Gravel, MD Inpatient      Home/SNF/Other Home  Chief Complaint sob  Level of Care/Admitting Diagnosis ED Disposition    ED Disposition Condition [place]: [facility] [[id number]]  Level of Care: Telemetry [5]  Diagnosis: COPD exacerbation [facility]) [[id number]]  Admitting Physician: Reubin Milan [[id number]]  Attending Physician: Reubin Milan [phone]  PT Class (Do Not Modify): Observation [104]  PT Acc Code (Do Not Modify): Observation [10022]       Medical History Past Medical History:  Diagnosis Date  . Anemia   . Anxiety   . Arthritis   . Chronic back pain   . COPD (chronic obstructive pulmonary disease) ([place])   . GERD (gastroesophageal reflux disease)   . Hypertension   . Hypothyroidism   . Reflux    no medications currently, asymptomatic  . Thyroid disease     Allergies Allergies  Allergen Reactions  . Penicillins Itching    Has patient had a PCN reaction causing immediate rash, facial/tongue/throat swelling, SOB or lightheadedness with hypotension: no Has patient had a PCN reaction causing severe rash involving mucus membranes or skin necrosis: No Has patient had a PCN reaction that required hospitalization No Has patient had a PCN reaction occurring within the last 10 years: No If all of the above answers are "NO", then may proceed with Cephalosporin use.   . Vicodin  [Hydrocodone-Acetaminophen] Nausea And Vomiting    IV Location/Drains/Wounds Patient Lines/Drains/Airways Status   Active Line/Drains/Airways    Name:   Placement date:   Placement time:   Site:   Days:   Peripheral IV 06/02/18 Left Antecubital   06/02/18    1739    Antecubital   1          Labs/Imaging Results for orders placed or performed during the hospital encounter of 06/02/18 (from the past 48 hour(s))  Comprehensive metabolic panel     Status: Abnormal   Collection Time: 06/02/18  5:31 PM  Result Value Ref Range   Sodium 137 135 - 145 mmol/L   Potassium 5.0 3.5 - 5.1 mmol/L   Chloride 101 98 - 111 mmol/L   CO2 28 22 - 32 mmol/L   Glucose, Bld 124 (H) 70 - 99 mg/dL   BUN 15 8 - 23 mg/dL   Creatinine, Ser 0.99 0.44 - 1.00 mg/dL   Calcium 9.3 8.9 - 10.3 mg/dL   Total Protein 8.1 6.5 - 8.1 g/dL   Albumin 3.7 3.5 - 5.0 g/dL   AST 31 15 - 41 U/L   ALT 15 0 - 44 U/L   Alkaline Phosphatase 80 38 - 126 U/L   Total Bilirubin 0.9 0.3 - 1.2 mg/dL   GFR calc non Af Amer 57 (L) >60 mL/min   GFR calc  Af Amer >60 >60 mL/min   Anion gap 8 5 - 15    Comment: Performed at [facility], [address] [place], [place] [address]  CBC with Differential     Status: Abnormal   Collection Time: 06/02/18  5:31 PM  Result Value Ref Range   WBC 7.9 4.0 - 10.5 K/uL   RBC 4.38 3.87 - 5.11 MIL/uL   Hemoglobin 10.4 (L) 12.0 - 15.0 g/dL   HCT 36.7 36.0 - 46.0 %   MCV 83.8 80.0 - 100.0 fL   MCH 23.7 (L) 26.0 - 34.0 pg   MCHC 28.3 (L) 30.0 - 36.0 g/dL   RDW 14.6 11.5 - 15.5 %   Platelets 382 150 - 400 K/uL   nRBC 0.0 0.0 - 0.2 %   Neutrophils Relative % 62 %   Neutro Abs 4.9 1.7 - 7.7 K/uL   Lymphocytes Relative 18 %   Lymphs Abs 1.5 0.7 - 4.0 K/uL   Monocytes Relative 10 %   Monocytes Absolute 0.8 0.1 - 1.0 K/uL   Eosinophils Relative 9 %   Eosinophils Absolute 0.7 (H) 0.0 - 0.5 K/uL   Basophils Relative 1 %   Basophils Absolute 0.0 0.0 - 0.1 K/uL   Immature Granulocytes 0 %   Abs  Immature Granulocytes 0.03 0.00 - 0.07 K/uL    Comment: Performed at [facility], [address] [place], [place] [address]  Brain natriuretic peptide     Status: None   Collection Time: 06/02/18  5:31 PM  Result Value Ref Range   B Natriuretic Peptide 26.0 0.0 - 100.0 pg/mL    Comment: Performed at [facility], [address], [place], [place] [address]  CBC     Status: Abnormal   Collection Time: 06/03/18  4:44 AM  Result Value Ref Range   WBC 4.7 4.0 - 10.5 K/uL   RBC 4.11 3.87 - 5.11 MIL/uL   Hemoglobin 9.8 (L) 12.0 - 15.0 g/dL   HCT 34.3 (L) 36.0 - 46.0 %   MCV 83.5 80.0 - 100.0 fL   MCH 23.8 (L) 26.0 - 34.0 pg   MCHC 28.6 (L) 30.0 - 36.0 g/dL   RDW 14.6 11.5 - 15.5 %   Platelets 314 150 - 400 K/uL    Comment: PLATELET COUNT CONFIRMED BY SMEAR SPECIMEN CHECKED FOR CLOTS    nRBC 0.0 0.0 - 0.2 %    Comment: Performed at [facility] [place], [address], [place], [place] [address]  Basic metabolic panel     Status: Abnormal   Collection Time: 06/03/18  4:44 AM  Result Value Ref Range   Sodium 137 135 - 145 mmol/L   Potassium 4.0 3.5 - 5.1 mmol/L    Comment: DELTA CHECK NOTED   Chloride 102 98 - 111 mmol/L   CO2 27 22 - 32 mmol/L   Glucose, Bld 183 (H) 70 - 99 mg/dL   BUN 15 8 - 23 mg/dL   Creatinine, Ser 0.89 0.44 - 1.00 mg/dL   Calcium 9.4 8.9 - 10.3 mg/dL   GFR calc non Af Amer >60 >60 mL/min   GFR calc Af Amer >60 >60 mL/min   Anion gap 8 5 - 15    Comment: Performed at [facility], [address], [place], [place] [address]  Vitamin B12     Status: None   Collection Time: 06/03/18  4:44 AM  Result Value Ref Range   Vitamin B-12 380 180 - 914 pg/mL    Comment: (NOTE)  This assay is not validated for testing neonatal or myeloproliferative syndrome specimens for Vitamin B12 levels. Performed at [facility], [address], [place], [place] [address]   Folate     Status: None   Collection Time: 06/03/18  4:44 AM  Result Value Ref Range   Folate 16.7 >5.9 ng/mL     Comment: Performed at [facility], [address] [place], [place] [address]  Iron and TIBC     Status: Abnormal   Collection Time: 06/03/18  4:44 AM  Result Value Ref Range   Iron 12 (L) 28 - 170 ug/dL   TIBC 396 250 - 450 ug/dL   Saturation Ratios 3 (L) 10.4 - 31.8 %   UIBC 384 ug/dL    Comment: Performed at [facility], [address] [place], [place] [address]  Ferritin     Status: None   Collection Time: 06/03/18  4:44 AM  Result Value Ref Range   Ferritin 26 11 - 307 ng/mL    Comment: Performed at [facility], [address] [place], [place] [address]  Reticulocytes     Status: None   Collection Time: 06/03/18  4:44 AM  Result Value Ref Range   Retic Ct Pct 1.4 0.4 - 3.1 %   RBC. 4.11 3.87 - 5.11 MIL/uL   Retic Count, Absolute 57.5 19.0 - 186.0 K/uL   Immature Retic Fract 13.7 2.3 - 15.9 %    Comment: Performed at [facility], [address] [place], [place] [address]   Dg Chest 2 View  Result Date: 06/02/2018 CLINICAL DATA:  Short of breath and dry cough for the past 2 days. History of COPD. EXAM: CHEST - 2 VIEW COMPARISON:  01/31/2018 FINDINGS: Cardiac silhouette is normal in size. No mediastinal or hilar masses. Lungs are hyperexpanded with chronic fibrotic changes stable from prior study. No evidence of pneumonia or pulmonary edema. No pleural effusion or pneumothorax. No acute skeletal abnormality IMPRESSION: 1. No acute cardiopulmonary disease. 2. COPD and lung fibrosis stable from the prior study. Electronically Signed   By: Lajean Manes M.D.   On: 06/02/2018 18:42    Pending Labs Unresulted Labs (From admission, onward)   None      Vitals/Pain Today's Vitals   06/03/18 0530 06/03/18 0600 06/03/18 0630 06/03/18 0644  BP: (!) 105/40 123/61 (!) 105/58 (!) 105/58  Pulse: 77 90 76 (!) 113  Resp:    (!) 22  Temp:      TempSrc:      SpO2:    96%  Weight:      Height:      PainSc:        Isolation Precautions No active isolations  Medications Medications   albuterol (PROVENTIL,VENTOLIN) solution continuous neb (10 mg/hr Nebulization New Bag/Given 06/02/18 1805)  methylPREDNISolone sodium succinate (SOLU-MEDROL) 40 mg/mL injection 40 mg (40 mg Intravenous Given 06/03/18 0334)    Followed by  predniSONE (DELTASONE) tablet 40 mg (has no administration in time range)  enoxaparin (LOVENOX) injection 30 mg (30 mg Subcutaneous Given 06/02/18 2211)  acetaminophen (TYLENOL) tablet 650 mg (has no administration in time range)    Or  acetaminophen (TYLENOL) suppository 650 mg (has no administration in time range)  ipratropium-albuterol (DUONEB) 0.5-2.5 (3) MG/3ML nebulizer solution 3 mL (3 mLs Nebulization Given 06/03/18 0139)  albuterol (PROVENTIL) (2.5 MG/3ML) 0.083% nebulizer solution 2.5 mg (has no administration in time range)  ALPRAZolam (XANAX) tablet 1 mg (has no administration in time range)  levothyroxine (SYNTHROID, LEVOTHROID) tablet  75 mcg (75 mcg Oral Not Given 06/02/18 2150)  oxyCODONE (Oxy IR/ROXICODONE) immediate release tablet 15 mg (has no administration in time range)  pantoprazole (PROTONIX) EC tablet 40 mg (40 mg Oral Not Given 06/02/18 2151)  ipratropium-albuterol (DUONEB) 0.5-2.5 (3) MG/3ML nebulizer solution 3 mL (3 mLs Nebulization Given 06/02/18 1805)  predniSONE (DELTASONE) tablet 60 mg (60 mg Oral Given 06/02/18 1753)  doxycycline (VIBRA-TABS) tablet 100 mg (100 mg Oral Given 06/02/18 2211)  magnesium sulfate IVPB 2 g 50 mL ( Intravenous Stopped 06/03/18 0333)    Mobility Up with oxygen and assist

## 2018-06-03 NOTE — Care Management Obs Status (Signed)
[place] NOTIFICATION   Patient Details  Name: Marilyn Solomon MRN: [id number] Date of Birth: 25-Oct-1945   Medicare Observation Status Notification Given:  Yes    Shelda Altes 06/03/2018, 11:06 AM

## 2018-06-03 NOTE — Progress Notes (Signed)
Initial Nutrition Assessment  DOCUMENTATION CODES:   Underweight  INTERVENTION:  Heart Healthy diet   Snack between meals as desired   Nutrition services to obtain meal preferences   NUTRITION DIAGNOSIS:   Increased nutrient needs related to acute illness, chronic illness(acute on chronic COPD) as evidenced by estimated needs, moderate muscle depletion, moderate fat depletion(diet history supports poor quality- based on reported food/ beverage choices).   GOAL:  Patient will meet greater than or equal to 90% of their needs   MONITOR:   PO intake, Labs, Weight trends  REASON FOR ASSESSMENT:   Consult Assessment of nutrition requirement/status  ASSESSMENT: Patient is a 73 yo female who presented short of breath. She has a hx of COPD, GERD, Anemia, HTN and hypothyroid.  Patient presents with shortness of breath . She is not on chronic oxygen support at home. COPD exacerbation. Patient denies recent changes in appetite.    Usual meals include:  Grain bar and coffee for breakfast, peanut butter sandwich or personal pizza and [place] lunch and dinner varies: an example- fish sticks, cream potatoes green peas. Educated pt regarding general healthful diet options to improve quality of nutrition intake. Encouraged fresh fruits vegetable lean sources of protein. Patient and daughter both shop for food.   Patient says she is raising her 71 yo granddaughter and this keeps her very busy and though she eats regularly and snacks between meals she has been unable to gain weight. Review of weight history indicates stable weight at 48-49 kg since March of 2019. Prior to she has remained primarily between 48-52 kg for the past almost 3 years.   Medications reviewed and include: prednisone, Protonix, levothyroxine  LABS: BMP Latest Ref Rng & Units 06/03/2018 06/02/2018 01/31/2018  Glucose 70 - 99 mg/dL 183(H) 124(H) 122(H)  BUN 8 - 23 mg/dL 15 15 18   Creatinine 0.44 - 1.00 mg/dL 0.89 0.99 0.95   Sodium 135 - 145 mmol/L 137 137 140  Potassium 3.5 - 5.1 mmol/L 4.0 5.0 4.1  Chloride 98 - 111 mmol/L 102 101 100  CO2 22 - 32 mmol/L 27 28 30   Calcium 8.9 - 10.3 mg/dL 9.4 9.3 9.0     NUTRITION - FOCUSED PHYSICAL EXAM:    Most Recent Value  Orbital Region  Moderate depletion  Thoracic and Lumbar Region  Moderate depletion  Temple Region  Mild depletion  Clavicle Bone Region  Severe depletion  Clavicle and Acromion Bone Region  Moderate depletion  Anterior Thigh Region  No depletion  Posterior Calf Region  No depletion  Edema (RD Assessment)  None       Diet Order:   Diet Order            Diet Heart Room service appropriate? Yes; Fluid consistency: Thin  Diet effective now              EDUCATION NEEDS:   Education needs have been addressed(see note) Skin:  Skin Assessment: Reviewed RN Assessment  Last BM:  unknown  Height:   Ht Readings from Last 1 Encounters:  06/02/18 5\' 5"  (1.651 m)    Weight:   Wt Readings from Last 1 Encounters:  06/02/18 48.5 kg    Ideal Body Weight:  57 kg  BMI:  Body mass index is 17.81 kg/m.  Estimated Nutritional Needs:   Kcal:  6222-9798  (33-36 kcal/kg/bw)  Protein:  64-74  (1.3-1.5 gr/kg/bw)  Fluid:  1.4-1.5 liters daily   Colman Cater MS,RD,CSG,LDN Office: [phone] Pager: [phone]

## 2018-06-03 NOTE — ED Notes (Addendum)
Pt denies any difficulty breathing.  Denies any needs.  Call light in reach.  Updated on current plan for care and bed availability. Pulse ox not validating from room. Sats 95% 2L.

## 2018-06-03 NOTE — Progress Notes (Signed)
PROGRESS NOTE    Marilyn Solomon  [name]:[id number] DOB: 1946/04/13 DOA: 06/02/2018 PCP: Lucianne Lei, MD   Brief Narrative:  Per HPI: Marilyn Solomon is a 73 y.o. female with medical history significant of anemia, anxiety, osteoarthritis, chronic back pain, COPD, GERD, hypertension, hypothyroidism who is coming to the emergency department with complaints of progressively worse dyspnea, wheezing and dry cough for the past 2 days.  She denies fever, but complains of mild lightheadedness, chills and fatigue.  No rhinorrhea, sore throat, hemoptysis, chest pain, palpitations, diaphoresis, PND, orthopnea or pitting edema lower extremities.  She denies abdominal pain, nausea, emesis, diarrhea, constipation, melena or hematochezia.  No dysuria, frequency or hematuria.  Denies polyuria, polydipsia, polyphagia or blurred vision.  Patient has been admitted with acute COPD exacerbation and has been placed on 2 L nasal cannula on account of acute hypoxemic respiratory failure.  She appears to be improving with breathing treatments.  Assessment & Plan:   Principal Problem:   COPD exacerbation ([place]) Active Problems:   Anxiety   Hypothyroidism   GERD (gastroesophageal reflux disease)   Anemia   Hypertension   1. Acute hypoxemic respiratory failure secondary to COPD exacerbation.  Continue on DuoNebs every 6 hours as well as IV methylprednisolone twice daily and Pulmicort twice daily.  Wean oxygen as tolerated and continue incentive spirometry. 2. Iron deficiency anemia.  Feraheme transfusion today and continue on iron supplementation at home.  No overt bleeding noted.  Monitor CBC in a.m. 3. Hypothyroidism.  Continue levothyroxine 75 mcg p.o. daily. 4. Anxiety.  Continue on alprazolam as needed. 5. GERD continue PPI. 6. Hypertension.  Currently well controlled and not on home antihypertensives.   DVT prophylaxis: Lovenox Code Status: Full Family Communication: None at bedside Disposition Plan:  AE COPD treatment with anticipated discharge in 24 to 48 hours once oxygen was weaned.   Consultants:   None  Procedures:   None  Antimicrobials:   Doxycycline 1/5->   Subjective: Patient seen and evaluated today with no new acute complaints or concerns. No acute concerns or events noted overnight.  She appears to have less shortness of breath after breathing treatments yesterday.  Objective: Vitals:   06/03/18 0839 06/03/18 0842 06/03/18 0900 06/03/18 0946  BP:   123/67 137/69  Pulse:   98 100  Resp:   17 17  Temp: 98 F (36.7 C)   97.9 F (36.6 C)  TempSrc: Oral   Oral  SpO2:  95%  96%  Weight:      Height:        Intake/Output Summary (Last 24 hours) at 06/03/2018 1248 Last data filed at 06/03/2018 0933 Gross per 24 hour  Intake 159.89 ml  Output -  Net 159.89 ml   Filed Weights   06/02/18 1702  Weight: 48.5 kg    Examination:  General exam: Appears calm and comfortable  Respiratory system: Clear to auscultation. Respiratory effort normal.  No wheezing appreciated.  Currently on 2 L nasal cannula. Cardiovascular system: S1 & S2 heard, RRR. No JVD, murmurs, rubs, gallops or clicks. No pedal edema. Gastrointestinal system: Abdomen is nondistended, soft and nontender. No organomegaly or masses felt. Normal bowel sounds heard. Central nervous system: Alert and oriented. No focal neurological deficits. Extremities: Symmetric 5 x 5 power. Skin: No rashes, lesions or ulcers Psychiatry: Judgement and insight appear normal. Mood & affect appropriate.     Data Reviewed: I have personally reviewed following labs and imaging studies  CBC: Recent Labs  Lab 06/02/18  1731 06/03/18 0444  WBC 7.9 4.7  NEUTROABS 4.9  --   HGB 10.4* 9.8*  HCT 36.7 34.3*  MCV 83.8 83.5  PLT 382 798   Basic Metabolic Panel: Recent Labs  Lab 06/02/18 1731 06/03/18 0444  NA 137 137  K 5.0 4.0  CL 101 102  CO2 28 27  GLUCOSE 124* 183*  BUN 15 15  CREATININE 0.99 0.89    CALCIUM 9.3 9.4   GFR: Estimated Creatinine Clearance: 44.4 mL/min (by C-G formula based on SCr of 0.89 mg/dL). Liver Function Tests: Recent Labs  Lab 06/02/18 1731  AST 31  ALT 15  ALKPHOS 80  BILITOT 0.9  PROT 8.1  ALBUMIN 3.7   No results for input(s): LIPASE, AMYLASE in the last 168 hours. No results for input(s): AMMONIA in the last 168 hours. Coagulation Profile: No results for input(s): INR, PROTIME in the last 168 hours. Cardiac Enzymes: No results for input(s): CKTOTAL, CKMB, CKMBINDEX, TROPONINI in the last 168 hours. BNP (last 3 results) No results for input(s): PROBNP in the last 8760 hours. HbA1C: No results for input(s): HGBA1C in the last 72 hours. CBG: No results for input(s): GLUCAP in the last 168 hours. Lipid Profile: No results for input(s): CHOL, HDL, LDLCALC, TRIG, CHOLHDL, LDLDIRECT in the last 72 hours. Thyroid Function Tests: No results for input(s): TSH, T4TOTAL, FREET4, T3FREE, THYROIDAB in the last 72 hours. Anemia Panel: Recent Labs    06/03/18 0444  VITAMINB12 380  FOLATE 16.7  FERRITIN 26  TIBC 396  IRON 12*  RETICCTPCT 1.4   Sepsis Labs: No results for input(s): PROCALCITON, LATICACIDVEN in the last 168 hours.  No results found for this or any previous visit (from the past 240 hour(s)).       Radiology Studies: Dg Chest 2 View  Result Date: 06/02/2018 CLINICAL DATA:  Short of breath and dry cough for the past 2 days. History of COPD. EXAM: CHEST - 2 VIEW COMPARISON:  01/31/2018 FINDINGS: Cardiac silhouette is normal in size. No mediastinal or hilar masses. Lungs are hyperexpanded with chronic fibrotic changes stable from prior study. No evidence of pneumonia or pulmonary edema. No pleural effusion or pneumothorax. No acute skeletal abnormality IMPRESSION: 1. No acute cardiopulmonary disease. 2. COPD and lung fibrosis stable from the prior study. Electronically Signed   By: Lajean Manes M.D.   On: 06/02/2018 18:42         Scheduled Meds: . enoxaparin (LOVENOX) injection  30 mg Subcutaneous Q24H  . ipratropium-albuterol  3 mL Nebulization Q6H  . levothyroxine  75 mcg Oral Daily  . methylPREDNISolone (SOLU-MEDROL) injection  40 mg Intravenous Q12H  . pantoprazole  40 mg Oral Daily   Continuous Infusions: . sodium chloride 10 mL (06/03/18 0849)  . ferumoxytol Stopped (06/03/18 0905)     LOS: 0 days    Time spent: 30 minutes    Kaydee Magel Darleen Crocker, DO [facility] Pager [phone]  If 7PM-7AM, please contact night-coverage [email or web address] Password [email or web address] 06/03/2018, 12:48 PM

## 2018-06-03 NOTE — Progress Notes (Signed)
Attempted to wean patient to room air, pt unable to maintain sats above 92 when seated with O2 dropping to 90. When ambulating pt sats drop to 81 and pt is unable to raise them above 86. When returned to bed, pt sats not able to rise above 87 unless placed on 2 L. Will continue to monitor.

## 2018-06-04 DIAGNOSIS — J441 Chronic obstructive pulmonary disease with (acute) exacerbation: Secondary | ICD-10-CM | POA: Diagnosis not present

## 2018-06-04 LAB — BASIC METABOLIC PANEL
ANION GAP: 9 (ref 5–15)
BUN: 26 mg/dL — AB (ref 8–23)
CO2: 29 mmol/L (ref 22–32)
Calcium: 9.4 mg/dL (ref 8.9–10.3)
Chloride: 100 mmol/L (ref 98–111)
Creatinine, Ser: 0.87 mg/dL (ref 0.44–1.00)
GFR calc Af Amer: >60 mL/min (ref >60)
GFR calc non Af Amer: >60 mL/min (ref >60)
Glucose, Bld: 157 mg/dL — ABNORMAL HIGH (ref 70–99)
POTASSIUM: 5.2 mmol/L — AB (ref 3.5–5.1)
SODIUM: 138 mmol/L (ref 135–145)

## 2018-06-04 LAB — CBC
HCT: 33.6 % — ABNORMAL LOW (ref 36.0–46.0)
Hemoglobin: 9.5 g/dL — ABNORMAL LOW (ref 12.0–15.0)
MCH: 24 pg — AB (ref 26.0–34.0)
MCHC: 28.3 g/dL — ABNORMAL LOW (ref 30.0–36.0)
MCV: 84.8 fL (ref 80.0–100.0)
Platelets: 318 10*3/uL (ref 150–400)
RBC: 3.96 MIL/uL (ref 3.87–5.11)
RDW: 15 % (ref 11.5–15.5)
WBC: 16.1 10*3/uL — ABNORMAL HIGH (ref 4.0–10.5)
nRBC: 0 % (ref 0.0–0.2)

## 2018-06-04 MED ORDER — IPRATROPIUM-ALBUTEROL 0.5-2.5 (3) MG/3ML IN SOLN: 3.0000 mL | Freq: Four times a day (QID) | RESPIRATORY_TRACT | 3 refills | Status: DC | PRN

## 2018-06-04 MED ORDER — DOCUSATE SODIUM 100 MG PO CAPS: 100.0000 mg | ORAL_CAPSULE | Freq: Two times a day (BID) | ORAL | 2 refills | Status: DC

## 2018-06-04 MED ORDER — FERROUS SULFATE 325 (65 FE) MG PO TABS: 325.0000 mg | ORAL_TABLET | Freq: Every day | ORAL | 3 refills | Status: DC

## 2018-06-04 MED ORDER — PREDNISONE 20 MG PO TABS: 40.0000 mg | ORAL_TABLET | Freq: Every day | ORAL | 0 refills | Status: AC

## 2018-06-04 MED ORDER — GUAIFENESIN-DM 100-10 MG/5ML PO SYRP: 5.0000 mL | ORAL_SOLUTION | ORAL | 0 refills | Status: DC | PRN

## 2018-06-04 NOTE — Discharge Summary (Signed)
Physician Discharge Summary  Marilyn Solomon WRU:[id number] DOB: 06-Aug-1945 DOA: 06/02/2018  PCP: Marilyn Lei, MD  Admit date: 06/02/2018  Discharge date: 06/04/2018  Admitted From: Home  Disposition: Home  Recommendations for Outpatient Follow-up:  1. Follow up with PCP in 1-2 weeks 2. Continue on prednisone for 5 more days as prescribed 3. Discharged with home oxygen 4. Discharged with iron supplementation and Colace 5. Given home nebulizer as well as breathing treatments to use as needed  Home Health: None  Equipment/Devices: Home oxygen 2 L nasal cannula  Discharge Condition: Stable  CODE STATUS: Full  Diet recommendation: Heart Healthy  Brief/Interim Summary: Per HPI: Marilyn Solomon a 72 y.o.femalewith medical history significant ofanemia, anxiety, osteoarthritis, chronic back pain, COPD, GERD, hypertension, hypothyroidism who is coming to the emergency department with complaints of progressively worse dyspnea, wheezingand dry cough for the past 2 days. She denies fever, but complains of mild lightheadedness, chills and fatigue. No rhinorrhea, sore throat, hemoptysis, chest pain, palpitations, diaphoresis, PND, orthopnea or pitting edema lower extremities. She denies abdominal pain, nausea, emesis, diarrhea, constipation, melena or hematochezia. No dysuria, frequency or hematuria. Denies polyuria, polydipsia, polyphagia or blurred vision.  Patient has been admitted with acute COPD exacerbation and has been placed on 2 L nasal cannula on account of acute hypoxemic respiratory failure.  She had improved considerably with use of breathing treatments and IV steroids, but continues to require some oxygen with ambulation.  She will be prescribed home oxygen on discharge as well as nebulizer machine and breathing treatments as needed.  She is to continue on prednisone for 5 more days at home as well.  Follow-up to her PCP as noted above in 1 to 2 weeks for  reevaluation.  Discharge Diagnoses:  Principal Problem:   COPD exacerbation ([place]) Active Problems:   Anxiety   Hypothyroidism   GERD (gastroesophageal reflux disease)   Anemia   Hypertension  Principal discharge diagnosis: Acute hypoxemic respiratory failure secondary to COPD exacerbation.  Discharge Instructions  Discharge Instructions    DME Nebulizer machine   Complete by:  As directed    Patient needs a nebulizer to treat with the following condition:  COPD (chronic obstructive pulmonary disease) (HCC)   Diet - low sodium heart healthy   Complete by:  As directed    Increase activity slowly   Complete by:  As directed      Allergies as of 06/04/2018      Reactions   Penicillins Itching   Has patient had a PCN reaction causing immediate rash, facial/tongue/throat swelling, SOB or lightheadedness with hypotension: no Has patient had a PCN reaction causing severe rash involving mucus membranes or skin necrosis: No Has patient had a PCN reaction that required hospitalization No Has patient had a PCN reaction occurring within the last 10 years: No If all of the above answers are "NO", then may proceed with Cephalosporin use.   Vicodin [hydrocodone-acetaminophen] Nausea And Vomiting      Medication List    TAKE these medications   ALPRAZolam 1 MG tablet Commonly known as:  XANAX Take 1 mg by mouth every 8 (eight) hours as needed for anxiety.   docusate sodium 100 MG capsule Commonly known as:  COLACE Take 1 capsule (100 mg total) by mouth 2 (two) times daily.   ferrous sulfate 325 (65 FE) MG tablet Take 1 tablet (325 mg total) by mouth daily.   guaiFENesin-dextromethorphan 100-10 MG/5ML syrup Commonly known as:  ROBITUSSIN DM Take 5 mLs by  mouth every 4 (four) hours as needed for cough (chest congestion).   ipratropium-albuterol 0.5-2.5 (3) MG/3ML Soln Commonly known as:  DUONEB Take 3 mLs by nebulization every 6 (six) hours as needed (shortness of breath or  wheezing).   levothyroxine 75 MCG tablet Commonly known as:  SYNTHROID, LEVOTHROID Take 75 mcg by mouth daily.   multivitamin with minerals Tabs tablet Take 1 tablet by mouth daily after supper.   oxyCODONE 15 MG immediate release tablet Commonly known as:  ROXICODONE Take 15 mg by mouth 3 (three) times daily as needed for pain.   pantoprazole 40 MG tablet Commonly known as:  PROTONIX TAKE 1 TABLET BY MOUTH DAILY. What changed:  how much to take   predniSONE 20 MG tablet Commonly known as:  DELTASONE Take 2 tablets (40 mg total) by mouth daily for 5 days.   Vitamin D (Ergocalciferol) 1.25 MG (50000 UT) Caps capsule Commonly known as:  DRISDOL Take 50,000 Units by mouth every Friday.            Durable Medical Equipment  (From admission, onward)         Start     Ordered   06/04/18 1239  DME Oxygen  Once    Question Answer Comment  Mode or (Route) Nasal cannula   Liters per Minute 2   Frequency Continuous (stationary and portable oxygen unit needed)   Oxygen conserving device Yes   Oxygen delivery system Gas      06/04/18 1238   06/04/18 0000  DME Nebulizer machine    Question:  Patient needs a nebulizer to treat with the following condition  Answer:  COPD (chronic obstructive pulmonary disease) ([place])   06/04/18 1238         Follow-up Information    Marilyn Lei, MD Follow up in 1 week(s).   Specialty:  Family Medicine Contact information: [address] [place] [address] [phone]          Allergies  Allergen Reactions  . Penicillins Itching    Has patient had a PCN reaction causing immediate rash, facial/tongue/throat swelling, SOB or lightheadedness with hypotension: no Has patient had a PCN reaction causing severe rash involving mucus membranes or skin necrosis: No Has patient had a PCN reaction that required hospitalization No Has patient had a PCN reaction occurring within the last 10 years: No If all of the above answers are  "NO", then may proceed with Cephalosporin use.   . Vicodin [Hydrocodone-Acetaminophen] Nausea And Vomiting    Consultations:  None   Procedures/Studies: Dg Chest 2 View  Result Date: 06/02/2018 CLINICAL DATA:  Short of breath and dry cough for the past 2 days. History of COPD. EXAM: CHEST - 2 VIEW COMPARISON:  01/31/2018 FINDINGS: Cardiac silhouette is normal in size. No mediastinal or hilar masses. Lungs are hyperexpanded with chronic fibrotic changes stable from prior study. No evidence of pneumonia or pulmonary edema. No pleural effusion or pneumothorax. No acute skeletal abnormality IMPRESSION: 1. No acute cardiopulmonary disease. 2. COPD and lung fibrosis stable from the prior study. Electronically Signed   By: Lajean Manes M.D.   On: 06/02/2018 18:42     Discharge Exam: Vitals:   06/04/18 1200 06/04/18 1215  BP:    Pulse:    Resp:    Temp:    SpO2: 96% (!) 85%   Vitals:   06/04/18 0700 06/04/18 0806 06/04/18 1200 06/04/18 1215  BP: 135/60     Pulse: 99  Resp: 18     Temp: 98 F (36.7 C)     TempSrc: Oral     SpO2: 99% 96% 96% (!) 85%  Weight:      Height:        General: Pt is alert, awake, not in acute distress Cardiovascular: RRR, S1/S2 +, no rubs, no gallops Respiratory: CTA bilaterally, no wheezing, no rhonchi Abdominal: Soft, NT, ND, bowel sounds + Extremities: no edema, no cyanosis    The results of significant diagnostics from this hospitalization (including imaging, microbiology, ancillary and laboratory) are listed below for reference.     Microbiology: No results found for this or any previous visit (from the past 240 hour(s)).   Labs: BNP (last 3 results) Recent Labs    06/02/18 1731  BNP 12.4   Basic Metabolic Panel: Recent Labs  Lab 06/02/18 1731 06/03/18 0444 06/04/18 0519  NA 137 137 138  K 5.0 4.0 5.2*  CL 101 102 100  CO2 28 27 29   GLUCOSE 124* 183* 157*  BUN 15 15 26*  CREATININE 0.99 0.89 0.87  CALCIUM 9.3 9.4 9.4    Liver Function Tests: Recent Labs  Lab 06/02/18 1731  AST 31  ALT 15  ALKPHOS 80  BILITOT 0.9  PROT 8.1  ALBUMIN 3.7   No results for input(s): LIPASE, AMYLASE in the last 168 hours. No results for input(s): AMMONIA in the last 168 hours. CBC: Recent Labs  Lab 06/02/18 1731 06/03/18 0444 06/04/18 0519  WBC 7.9 4.7 16.1*  NEUTROABS 4.9  --   --   HGB 10.4* 9.8* 9.5*  HCT 36.7 34.3* 33.6*  MCV 83.8 83.5 84.8  PLT 382 314 318   Cardiac Enzymes: No results for input(s): CKTOTAL, CKMB, CKMBINDEX, TROPONINI in the last 168 hours. BNP: Invalid input(s): POCBNP CBG: No results for input(s): GLUCAP in the last 168 hours. D-Dimer No results for input(s): DDIMER in the last 72 hours. Hgb A1c No results for input(s): HGBA1C in the last 72 hours. Lipid Profile No results for input(s): CHOL, HDL, LDLCALC, TRIG, CHOLHDL, LDLDIRECT in the last 72 hours. Thyroid function studies No results for input(s): TSH, T4TOTAL, T3FREE, THYROIDAB in the last 72 hours.  Invalid input(s): FREET3 Anemia work up Recent Labs    06/03/18 0444  VITAMINB12 380  FOLATE 16.7  FERRITIN 26  TIBC 396  IRON 12*  RETICCTPCT 1.4   Urinalysis    Component Value Date/Time   COLORURINE YELLOW 01/31/2018 1126   APPEARANCEUR HAZY (A) 01/31/2018 1126   LABSPEC 1.017 01/31/2018 1126   PHURINE 5.0 01/31/2018 1126   GLUCOSEU NEGATIVE 01/31/2018 1126   HGBUR SMALL (A) 01/31/2018 1126   [place] 01/31/2018 1126   KETONESUR NEGATIVE 01/31/2018 1126   PROTEINUR 30 (A) 01/31/2018 1126   UROBILINOGEN 0.2 03/25/2008 1702   NITRITE NEGATIVE 01/31/2018 1126   LEUKOCYTESUR TRACE (A) 01/31/2018 1126   Sepsis Labs Invalid input(s): PROCALCITONIN,  WBC,  LACTICIDVEN Microbiology No results found for this or any previous visit (from the past 240 hour(s)).   Time coordinating discharge: 35 minutes  SIGNED:   Rodena Goldmann, DO [facility] 06/04/2018, 12:38 PM Pager  [phone]  If 7PM-7AM, please contact night-coverage [email or web address] Password [email or web address]

## 2018-06-04 NOTE — Progress Notes (Signed)
SPO2 checked while pt ambulating per order, pt's Sat's dropped from 96 sitting in bed to 85 while ambulating.  Pt ambulated full distance around nurse's station, denies chest pain or SOB, minimal work to breath.  Pt educated in pursed lipped breathing while sitting and Sat's back to baseline at 96.  MD aware of above.  [id number]

## 2018-06-04 NOTE — Progress Notes (Addendum)
SATURATION QUALIFICATIONS: (This note is used to comply with regulatory documentation for home oxygen)  Patient Saturations on Room Air at Rest = 96%  Patient Saturations on Room Air while Ambulating = 85%  Patient Saturations on 2 Liters of oxygen while Ambulating = 93% Please briefly explain why patient needs home oxygen:

## 2018-06-04 NOTE — Care Management Note (Signed)
Case Management Note  Patient Details  Name: MADILYN CEPHAS MRN: [id number] Date of Birth: Jan 11, 1946  Subjective/Objective:     COPD exacerbation. Pt needs home O2. Has chosen [facility] from DME provider list.                Action/Plan: DC home today with Home O2. Vaughan Basta, [facility] given referral.   Expected Discharge Date:  06/04/18               Expected Discharge Plan:  [place]  In-House Referral:  NA  Discharge planning Services  CM Consult  Post Acute Care Choice:  Durable Medical Equipment Choice offered to:  Patient  DME Arranged:  Oxygen DME Agency:  [place]:    [facility] Agency:     Status of Service:  Completed, signed off  If discussed at [place] of Stay Meetings, dates discussed:    Additional Comments:  Sherald Barge, RN 06/04/2018, 12:40 PM

## 2018-06-08 DIAGNOSIS — J449 Chronic obstructive pulmonary disease, unspecified: Secondary | ICD-10-CM | POA: Diagnosis not present

## 2018-06-08 DIAGNOSIS — Z681 Body mass index (BMI) 19 or less, adult: Secondary | ICD-10-CM | POA: Diagnosis not present

## 2018-06-08 DIAGNOSIS — M810 Age-related osteoporosis without current pathological fracture: Secondary | ICD-10-CM | POA: Diagnosis not present

## 2018-06-21 ENCOUNTER — Encounter (HOSPITAL_COMMUNITY): Payer: Self-pay

## 2018-06-21 ENCOUNTER — Emergency Department (HOSPITAL_COMMUNITY): Payer: Medicare HMO

## 2018-06-21 ENCOUNTER — Other Ambulatory Visit: Payer: Self-pay

## 2018-06-21 ENCOUNTER — Inpatient Hospital Stay (HOSPITAL_COMMUNITY)
Admission: EM | Admit: 2018-06-21 | Discharge: 2018-06-23 | DRG: 193 | Disposition: A | Discharge status: Home / Self Care | Payer: Medicare HMO | Attending: Internal Medicine | Admitting: Internal Medicine

## 2018-06-21 DIAGNOSIS — Z9981 Dependence on supplemental oxygen: Secondary | ICD-10-CM

## 2018-06-21 DIAGNOSIS — Z8249 Family history of ischemic heart disease and other diseases of the circulatory system: Secondary | ICD-10-CM

## 2018-06-21 DIAGNOSIS — Z79890 Hormone replacement therapy (postmenopausal): Secondary | ICD-10-CM

## 2018-06-21 DIAGNOSIS — Z800 Family history of malignant neoplasm of digestive organs: Secondary | ICD-10-CM

## 2018-06-21 DIAGNOSIS — E039 Hypothyroidism, unspecified: Secondary | ICD-10-CM | POA: Diagnosis present

## 2018-06-21 DIAGNOSIS — J9621 Acute and chronic respiratory failure with hypoxia: Secondary | ICD-10-CM | POA: Diagnosis present

## 2018-06-21 DIAGNOSIS — Z833 Family history of diabetes mellitus: Secondary | ICD-10-CM

## 2018-06-21 DIAGNOSIS — J440 Chronic obstructive pulmonary disease with acute lower respiratory infection: Secondary | ICD-10-CM | POA: Diagnosis present

## 2018-06-21 DIAGNOSIS — G8929 Other chronic pain: Secondary | ICD-10-CM | POA: Diagnosis present

## 2018-06-21 DIAGNOSIS — F419 Anxiety disorder, unspecified: Secondary | ICD-10-CM | POA: Diagnosis not present

## 2018-06-21 DIAGNOSIS — Z836 Family history of other diseases of the respiratory system: Secondary | ICD-10-CM | POA: Diagnosis not present

## 2018-06-21 DIAGNOSIS — Z87891 Personal history of nicotine dependence: Secondary | ICD-10-CM | POA: Diagnosis not present

## 2018-06-21 DIAGNOSIS — D509 Iron deficiency anemia, unspecified: Secondary | ICD-10-CM | POA: Diagnosis present

## 2018-06-21 DIAGNOSIS — M1990 Unspecified osteoarthritis, unspecified site: Secondary | ICD-10-CM | POA: Diagnosis present

## 2018-06-21 DIAGNOSIS — Z808 Family history of malignant neoplasm of other organs or systems: Secondary | ICD-10-CM

## 2018-06-21 DIAGNOSIS — Z79891 Long term (current) use of opiate analgesic: Secondary | ICD-10-CM

## 2018-06-21 DIAGNOSIS — Z90710 Acquired absence of both cervix and uterus: Secondary | ICD-10-CM

## 2018-06-21 DIAGNOSIS — R0602 Shortness of breath: Secondary | ICD-10-CM | POA: Diagnosis not present

## 2018-06-21 DIAGNOSIS — J441 Chronic obstructive pulmonary disease with (acute) exacerbation: Secondary | ICD-10-CM | POA: Diagnosis present

## 2018-06-21 DIAGNOSIS — Z96642 Presence of left artificial hip joint: Secondary | ICD-10-CM | POA: Diagnosis present

## 2018-06-21 DIAGNOSIS — Z885 Allergy status to narcotic agent status: Secondary | ICD-10-CM

## 2018-06-21 DIAGNOSIS — Y95 Nosocomial condition: Secondary | ICD-10-CM | POA: Diagnosis present

## 2018-06-21 DIAGNOSIS — J189 Pneumonia, unspecified organism: Secondary | ICD-10-CM

## 2018-06-21 DIAGNOSIS — Z818 Family history of other mental and behavioral disorders: Secondary | ICD-10-CM | POA: Diagnosis not present

## 2018-06-21 DIAGNOSIS — Z79899 Other long term (current) drug therapy: Secondary | ICD-10-CM

## 2018-06-21 DIAGNOSIS — K219 Gastro-esophageal reflux disease without esophagitis: Secondary | ICD-10-CM | POA: Diagnosis not present

## 2018-06-21 DIAGNOSIS — M549 Dorsalgia, unspecified: Secondary | ICD-10-CM | POA: Diagnosis present

## 2018-06-21 DIAGNOSIS — I10 Essential (primary) hypertension: Secondary | ICD-10-CM | POA: Diagnosis not present

## 2018-06-21 DIAGNOSIS — Z880 Allergy status to penicillin: Secondary | ICD-10-CM

## 2018-06-21 DIAGNOSIS — R05 Cough: Secondary | ICD-10-CM | POA: Diagnosis not present

## 2018-06-21 DIAGNOSIS — R000 Tachycardia, unspecified: Secondary | ICD-10-CM | POA: Diagnosis not present

## 2018-06-21 LAB — URINALYSIS, ROUTINE W REFLEX MICROSCOPIC
Bilirubin Urine: NEGATIVE
GLUCOSE, UA: NEGATIVE mg/dL
Hgb urine dipstick: NEGATIVE
Ketones, ur: NEGATIVE mg/dL
Nitrite: NEGATIVE
PROTEIN: 30 mg/dL — AB
Specific Gravity, Urine: 1.02 (ref 1.005–1.030)
pH: 5 (ref 5.0–8.0)

## 2018-06-21 LAB — CBC
HCT: 37.1 % (ref 36.0–46.0)
Hemoglobin: 10.5 g/dL — ABNORMAL LOW (ref 12.0–15.0)
MCH: 24.7 pg — ABNORMAL LOW (ref 26.0–34.0)
MCHC: 28.3 g/dL — ABNORMAL LOW (ref 30.0–36.0)
MCV: 87.3 fL (ref 80.0–100.0)
Platelets: 343 10*3/uL (ref 150–400)
RBC: 4.25 MIL/uL (ref 3.87–5.11)
RDW: 20.6 % — ABNORMAL HIGH (ref 11.5–15.5)
WBC: 14.8 10*3/uL — ABNORMAL HIGH (ref 4.0–10.5)
nRBC: 0 % (ref 0.0–0.2)

## 2018-06-21 LAB — PROTIME-INR
INR: 1.15
Prothrombin Time: 14.6 seconds (ref 11.4–15.2)

## 2018-06-21 LAB — INFLUENZA PANEL BY PCR (TYPE A & B)
Influenza A By PCR: NEGATIVE
Influenza B By PCR: NEGATIVE

## 2018-06-21 LAB — TSH: TSH: 1.298 u[IU]/mL (ref 0.350–4.500)

## 2018-06-21 LAB — BASIC METABOLIC PANEL
Anion gap: 11 (ref 5–15)
BUN: 20 mg/dL (ref 8–23)
CO2: 26 mmol/L (ref 22–32)
Calcium: 8.6 mg/dL — ABNORMAL LOW (ref 8.9–10.3)
Chloride: 101 mmol/L (ref 98–111)
Creatinine, Ser: 0.89 mg/dL (ref 0.44–1.00)
GFR calc Af Amer: >60 mL/min (ref >60)
GFR calc non Af Amer: >60 mL/min (ref >60)
Glucose, Bld: 128 mg/dL — ABNORMAL HIGH (ref 70–99)
Potassium: 3.9 mmol/L (ref 3.5–5.1)
Sodium: 138 mmol/L (ref 135–145)

## 2018-06-21 LAB — TROPONIN I: Troponin I: <0.03 ng/mL (ref <0.03)

## 2018-06-21 LAB — LACTIC ACID, PLASMA: Lactic Acid, Venous: 1.3 mmol/L (ref 0.5–1.9)

## 2018-06-21 MED ORDER — SODIUM CHLORIDE 0.9 % IV SOLN
INTRAVENOUS | Status: AC
  Administered 2018-06-21: 20:00:00 via INTRAVENOUS

## 2018-06-21 MED ORDER — SODIUM CHLORIDE 0.9% FLUSH
3.0000 mL | Freq: Once | INTRAVENOUS | Status: AC
  Administered 2018-06-21: 3 mL via INTRAVENOUS

## 2018-06-21 MED ORDER — IPRATROPIUM-ALBUTEROL 0.5-2.5 (3) MG/3ML IN SOLN
3.0000 mL | Freq: Once | RESPIRATORY_TRACT | Status: AC
  Administered 2018-06-21: 3 mL via RESPIRATORY_TRACT
  Filled 2018-06-21: qty 3

## 2018-06-21 MED ORDER — PANTOPRAZOLE SODIUM 40 MG PO TBEC
40.0000 mg | DELAYED_RELEASE_TABLET | Freq: Every day | ORAL | Status: DC
  Administered 2018-06-22 – 2018-06-23 (×2): 40 mg via ORAL
  Filled 2018-06-21 (×2): qty 1

## 2018-06-21 MED ORDER — ALBUTEROL SULFATE (2.5 MG/3ML) 0.083% IN NEBU
2.5000 mg | INHALATION_SOLUTION | Freq: Four times a day (QID) | RESPIRATORY_TRACT | Status: DC
  Administered 2018-06-21 – 2018-06-22 (×2): 2.5 mg via RESPIRATORY_TRACT
  Filled 2018-06-21 (×2): qty 3

## 2018-06-21 MED ORDER — SODIUM CHLORIDE 0.9 % IV BOLUS
500.0000 mL | Freq: Once | INTRAVENOUS | Status: AC
  Administered 2018-06-21: 500 mL via INTRAVENOUS

## 2018-06-21 MED ORDER — METHYLPREDNISOLONE SODIUM SUCC 125 MG IJ SOLR
60.0000 mg | Freq: Three times a day (TID) | INTRAMUSCULAR | Status: DC
  Administered 2018-06-21 – 2018-06-23 (×6): 60 mg via INTRAVENOUS
  Filled 2018-06-21 (×6): qty 2

## 2018-06-21 MED ORDER — ALPRAZOLAM 0.5 MG PO TABS: 0.5000 mg | ORAL_TABLET | Freq: Three times a day (TID) | ORAL | Status: DC | PRN

## 2018-06-21 MED ORDER — ALBUTEROL SULFATE (2.5 MG/3ML) 0.083% IN NEBU: 2.5000 mg | INHALATION_SOLUTION | RESPIRATORY_TRACT | Status: DC | PRN

## 2018-06-21 MED ORDER — TIOTROPIUM BROMIDE MONOHYDRATE 18 MCG IN CAPS: 18.0000 ug | ORAL_CAPSULE | Freq: Every day | RESPIRATORY_TRACT | Status: DC

## 2018-06-21 MED ORDER — SODIUM CHLORIDE 0.9 % IV SOLN
INTRAVENOUS | Status: AC
  Filled 2018-06-21 (×2): qty 2

## 2018-06-21 MED ORDER — FERROUS SULFATE 325 (65 FE) MG PO TABS
325.0000 mg | ORAL_TABLET | Freq: Every day | ORAL | Status: DC
  Administered 2018-06-21 – 2018-06-23 (×3): 325 mg via ORAL
  Filled 2018-06-21 (×3): qty 1

## 2018-06-21 MED ORDER — OXYCODONE HCL 5 MG PO TABS: 10.0000 mg | ORAL_TABLET | Freq: Three times a day (TID) | ORAL | Status: DC | PRN

## 2018-06-21 MED ORDER — DM-GUAIFENESIN ER 30-600 MG PO TB12
1.0000 | ORAL_TABLET | Freq: Two times a day (BID) | ORAL | Status: DC
  Administered 2018-06-21 – 2018-06-23 (×4): 1 via ORAL
  Filled 2018-06-21 (×4): qty 1

## 2018-06-21 MED ORDER — ALBUTEROL SULFATE (2.5 MG/3ML) 0.083% IN NEBU
5.0000 mg | INHALATION_SOLUTION | Freq: Once | RESPIRATORY_TRACT | Status: AC
  Administered 2018-06-21: 5 mg via RESPIRATORY_TRACT
  Filled 2018-06-21: qty 6

## 2018-06-21 MED ORDER — HEPARIN SODIUM (PORCINE) 5000 UNIT/ML IJ SOLN
5000.0000 [IU] | Freq: Three times a day (TID) | INTRAMUSCULAR | Status: DC
  Administered 2018-06-21 – 2018-06-23 (×6): 5000 [IU] via SUBCUTANEOUS
  Filled 2018-06-21 (×6): qty 1

## 2018-06-21 MED ORDER — LEVOTHYROXINE SODIUM 50 MCG PO TABS
75.0000 ug | ORAL_TABLET | Freq: Every day | ORAL | Status: DC
  Administered 2018-06-22 – 2018-06-23 (×2): 75 ug via ORAL
  Filled 2018-06-21 (×2): qty 2

## 2018-06-21 MED ORDER — VANCOMYCIN HCL IN DEXTROSE 1-5 GM/200ML-% IV SOLN: 1000.0000 mg | INTRAVENOUS | Status: DC

## 2018-06-21 MED ORDER — VITAMIN D (ERGOCALCIFEROL) 1.25 MG (50000 UNIT) PO CAPS
50000.0000 [IU] | ORAL_CAPSULE | ORAL | Status: DC
  Administered 2018-06-21: 50000 [IU] via ORAL
  Filled 2018-06-21 (×3): qty 1

## 2018-06-21 MED ORDER — UMECLIDINIUM BROMIDE 62.5 MCG/INH IN AEPB
1.0000 | INHALATION_SPRAY | Freq: Every day | RESPIRATORY_TRACT | Status: DC
  Administered 2018-06-22 – 2018-06-23 (×2): 1 via RESPIRATORY_TRACT
  Filled 2018-06-21: qty 7

## 2018-06-21 MED ORDER — SODIUM CHLORIDE 0.9 % IV SOLN
2.0000 g | Freq: Three times a day (TID) | INTRAVENOUS | Status: DC
  Administered 2018-06-21 – 2018-06-22 (×2): 2 g via INTRAVENOUS
  Filled 2018-06-21 (×9): qty 2

## 2018-06-21 MED ORDER — BUDESONIDE 0.5 MG/2ML IN SUSP
0.5000 mg | Freq: Two times a day (BID) | RESPIRATORY_TRACT | Status: DC
  Administered 2018-06-21 – 2018-06-22 (×2): 0.5 mg via RESPIRATORY_TRACT
  Filled 2018-06-21 (×2): qty 2

## 2018-06-21 MED ORDER — METHYLPREDNISOLONE SODIUM SUCC 125 MG IJ SOLR
125.0000 mg | Freq: Once | INTRAMUSCULAR | Status: AC
  Administered 2018-06-21: 125 mg via INTRAVENOUS
  Filled 2018-06-21: qty 2

## 2018-06-21 MED ORDER — FAMOTIDINE 20 MG PO TABS
20.0000 mg | ORAL_TABLET | Freq: Every day | ORAL | Status: DC
  Administered 2018-06-22 – 2018-06-23 (×2): 20 mg via ORAL
  Filled 2018-06-21 (×2): qty 1

## 2018-06-21 MED ORDER — VANCOMYCIN HCL IN DEXTROSE 1-5 GM/200ML-% IV SOLN
1000.0000 mg | Freq: Once | INTRAVENOUS | Status: DC
  Filled 2018-06-21: qty 200

## 2018-06-21 MED ORDER — LEVOFLOXACIN IN D5W 750 MG/150ML IV SOLN
750.0000 mg | Freq: Once | INTRAVENOUS | Status: AC
  Administered 2018-06-21: 750 mg via INTRAVENOUS
  Filled 2018-06-21: qty 150

## 2018-06-21 MED ORDER — ADULT MULTIVITAMIN W/MINERALS CH
1.0000 | ORAL_TABLET | Freq: Every day | ORAL | Status: DC
  Administered 2018-06-22: 1 via ORAL
  Filled 2018-06-21: qty 1

## 2018-06-21 NOTE — H&P (Signed)
History and Physical    Marilyn Solomon:[id number] DOB: 1946-04-20 DOA: 06/21/2018  Referring MD/NP/PA: Dr. Sedonia Small PCP: Lucianne Lei, MD  Patient coming from: Home  Chief Complaint: Shortness of breath and productive cough  HPI: Marilyn Solomon is a 73 y.o. female with PMH of chronic respiratory failure due to COPD (was supposed to be on oxygen supplementation at home, around 2-3 L), GERD, hypertension, hypothyroidism, arthritis, anemia and anxiety; who came to the ED due to shortness of breath and productive cough, that started approximately 2 days ago and has been getting worse since then. She described dark green thick sputum. Reported an episode of fever, nausea and vomiting. Denies recent travels, sick contacts, rhinorrhea, sore throat, hemoptysis, abdominal pain, dysuria, hematuria, changes in bowel movements, hematochezia or melena.  Of note, patient reports being of oxygen for couple days now after her oxygen tank ran out and has not been refilled.  In the ED patient was found warm to touch, with increase respiratory distress and oxygen saturation of 75% on room air.  She was also tachycardic with a heart rate in the 130s on presentation (albuterol nebulizer given by EMS in route).  Chest x-ray demonstrated new/acute lung infiltrates and she was found with elevated WBCs.  Cultures taken, fluid resuscitation given, started on steroids, nebulizer and antibiotics.  Influenza by PCR negative.  TRH call to place patient in the hospital for further evaluation and management.   Past Medical/Surgical History: Past Medical History:  Diagnosis Date  . Anemia   . Anxiety   . Arthritis   . Chronic back pain   . COPD (chronic obstructive pulmonary disease) ([place])   . GERD (gastroesophageal reflux disease)   . Hypertension   . Hypothyroidism   . Reflux    no medications currently, asymptomatic  . Thyroid disease     Past Surgical History:  Procedure Laterality Date  . ABDOMINAL  HYSTERECTOMY    . BIOPSY  10/08/2017   Procedure: BIOPSY;  Surgeon: Daneil Dolin, MD;  [facility]: [facility];  Service: Endoscopy;;  gastric  . cataracts Bilateral   . COLONOSCOPY  2007   Dr. Gala Romney: normal rectum, diverticula   . COLONOSCOPY WITH PROPOFOL N/A 11/15/2015   Dr. Gala Romney: grade II hemorrhoids, diverticulosis  . COLONOSCOPY WITH PROPOFOL N/A 10/08/2017   Procedure: COLONOSCOPY WITH PROPOFOL;  Surgeon: Daneil Dolin, MD;  Location: [facility];  Service: Endoscopy;  Laterality: N/A;  11:15am  . ESOPHAGOGASTRODUODENOSCOPY (EGD) WITH PROPOFOL N/A 10/08/2017   Procedure: ESOPHAGOGASTRODUODENOSCOPY (EGD) WITH PROPOFOL;  Surgeon: Daneil Dolin, MD;  Location: [facility];  Service: Endoscopy;  Laterality: N/A;  . FRACTURE SURGERY Right 2005   Wrist  . JOINT REPLACEMENT Left 2000   hip  . TOTAL HIP ARTHROPLASTY Left   . WRIST SURGERY Right     Social History:  reports that she quit smoking about 19 years ago. Her smoking use included cigarettes. She has a 25.00 pack-year smoking history. She has never used smokeless tobacco. She reports that she does not drink alcohol or use drugs.  Allergies: Allergies  Allergen Reactions  . Penicillins Itching    Has patient had a PCN reaction causing immediate rash, facial/tongue/throat swelling, SOB or lightheadedness with hypotension: no Has patient had a PCN reaction causing severe rash involving mucus membranes or skin necrosis: No Has patient had a PCN reaction that required hospitalization No Has patient had a PCN reaction occurring within the last 10 years: No If all of  the above answers are "NO", then may proceed with Cephalosporin use.   . Vicodin [Hydrocodone-Acetaminophen] Nausea And Vomiting    Family History:  Family History  Problem Relation Age of Onset  . Bone cancer Sister   . Pancreatic cancer Sister   . Pneumonia Mother   . Heart attack Father   . Diabetes Sister   . Dementia Sister   . Colon cancer Neg  Hx     Prior to Admission medications   Medication Sig Start Date End Date Taking? Authorizing Provider  ALPRAZolam Duanne Moron) 1 MG tablet Take 1 mg by mouth every 8 (eight) hours as needed for anxiety.  08/10/14  Yes [provider]  ferrous sulfate 325 (65 FE) MG tablet Take 1 tablet (325 mg total) by mouth daily. 06/04/18 06/04/19 Yes Shah, Pratik D, DO  ipratropium-albuterol (DUONEB) 0.5-2.5 (3) MG/3ML SOLN Take 3 mLs by nebulization every 6 (six) hours as needed (shortness of breath or wheezing). 06/04/18 07/04/18 Yes Shah, Pratik D, DO  levothyroxine (SYNTHROID, LEVOTHROID) 75 MCG tablet Take 75 mcg by mouth daily.     Yes [provider]  Multiple Vitamin (MULTIVITAMIN WITH MINERALS) TABS tablet Take 1 tablet by mouth daily after supper.   Yes [provider]  oxyCODONE (ROXICODONE) 15 MG immediate release tablet Take 15 mg by mouth 3 (three) times daily as needed for pain.    Yes [provider]  pantoprazole (PROTONIX) 40 MG tablet TAKE 1 TABLET BY MOUTH DAILY. Patient taking differently: Take 40 mg by mouth daily.  05/27/18  Yes Annitta Needs, NP  Vitamin D, Ergocalciferol, (DRISDOL) 50000 UNITS CAPS capsule Take 50,000 Units by mouth every Friday.  07/31/14  Yes [provider]    Review of Systems:  Negative except as otherwise mentioned on HPI   Physical Exam: Vitals:   06/21/18 1129 06/21/18 1130 06/21/18 1136 06/21/18 1441  BP:  100/63  (!) 103/53  Pulse: (!) 129 (!) 133  (!) 119  Resp:    (!) 23  Temp:      TempSrc:      SpO2: 90% (!) 87% 93% (!) 85%  Weight:      Height:        Constitutional: Patient looks frail, underweight and chronically ill. Mild difficulty speaking in full sentences and expressing SOB with exertion. No CP Eyes: PERRL, lids and conjunctivae normal, no icterus, no nystagmus. ENMT: Mucous membranes are moist. Posterior pharynx clear of any exudate or lesions.  No thrush. Neck: supple, no masses, no thyromegaly, no  JVD. Respiratory: positive for bronchi and diffuse expiratory wheezing.  No using accessory muscles.  Positive tachypnea Cardiovascular: Sinus tachycardia, no m/r/g. No extremity edema. 2+ pedal pulses. No carotid bruits.  Abdomen: Soft, no tenderness or masses palpated. No hepatosplenomegaly. Bowel sounds positive.  Musculoskeletal: no clubbing or cyanosis. Good ROM. Normal muscle tone.  Skin: no rashes, lesions or open ulcers.  Neurologic: Cranial nerves 2-12 grossly intact. Sensation intact, DTR normal. Strength 5/5 in all 4 extremities.  Psychiatric: Normal judgment and insight. Alert and oriented x 3. Normal mood.   Labs on Admission: I have personally reviewed the following labs and imaging studies  CBC: Recent Labs  Lab 06/21/18 1118  WBC 14.8*  HGB 10.5*  HCT 37.1  MCV 87.3  PLT 962   Basic Metabolic Panel: Recent Labs  Lab 06/21/18 1118  NA 138  K 3.9  CL 101  CO2 26  GLUCOSE 128*  BUN 20  CREATININE 0.89  CALCIUM 8.6*   GFR: Estimated Creatinine Clearance: 44.4 mL/min (by C-G formula based on SCr of 0.89 mg/dL).  Coagulation Profile: Recent Labs  Lab 06/21/18 1118  INR 1.15   Cardiac Enzymes: Recent Labs  Lab 06/21/18 1118  TROPONINI <0.03   Urine analysis:    Component Value Date/Time   COLORURINE AMBER (A) 06/21/2018 1450   APPEARANCEUR CLOUDY (A) 06/21/2018 1450   LABSPEC 1.020 06/21/2018 1450   PHURINE 5.0 06/21/2018 1450   GLUCOSEU NEGATIVE 06/21/2018 1450   HGBUR NEGATIVE 06/21/2018 1450   BILIRUBINUR NEGATIVE 06/21/2018 1450   KETONESUR NEGATIVE 06/21/2018 1450   PROTEINUR 30 (A) 06/21/2018 1450   UROBILINOGEN 0.2 03/25/2008 1702   NITRITE NEGATIVE 06/21/2018 1450   LEUKOCYTESUR TRACE (A) 06/21/2018 1450    Recent Results (from the past 240 hour(s))  Culture, blood (Routine x 2)     Status: None (Preliminary result)   Collection Time: 06/21/18 11:18 AM  Result Value Ref Range Status   Specimen Description BLOOD BLOOD RIGHT WRIST   Final   Special Requests   Final    BOTTLES DRAWN AEROBIC AND ANAEROBIC Blood Culture adequate volume Performed at [facility], [address], [place], [place] [address]    Culture PENDING  Incomplete   Report Status PENDING  Incomplete  Culture, blood (Routine x 2)     Status: None (Preliminary result)   Collection Time: 06/21/18 12:05 PM  Result Value Ref Range Status   Specimen Description BLOOD LEFT ANTECUBITAL  Final   Special Requests   Final    BOTTLES DRAWN AEROBIC AND ANAEROBIC Blood Culture adequate volume Performed at [facility], [address], [place], [place] [address]    Culture PENDING  Incomplete   Report Status PENDING  Incomplete     Radiological Exams on Admission: Dg Chest 2 View  Result Date: 06/21/2018 CLINICAL DATA:  Cough for 2 days.  Shortness of breath. EXAM: CHEST - 2 VIEW COMPARISON:  June 02, 2018 FINDINGS: Increasing infiltrate in the bases, left greater than right. Chronic changes throughout the remainder of the lungs are stable. The cardiomediastinal silhouette is unchanged. Small effusions. IMPRESSION: 1. Increasing infiltrate in the bases, left greater than right, consistent with pneumonia or aspiration. Tiny effusions are stable. Other chronic changes are stable. Electronically Signed   By: Dorise Bullion III M.D   On: 06/21/2018 11:59    EKG: Independently reviewed.  Sinus tachycardia, normal QT.  LVH by voltage appreciated on exam.  No acute ischemic changes.  Assessment/Plan: 1-acute on chronic respiratory failure with hypoxia -In the setting of HCAP and COPD exacerbation -Start on steroids, nebulizer management, flutter valve, IV antibiotics (using vancomycin and aztreonam). -PRN antipyretics -continue IV fluids -From pneumonia standpoint we will check sputum culture, blood culture, strep antigen in urine and Legionella antigen in urine -oxygen supplementation as needed with intention to wean down as tolerated to baseline. -Clinical  response.  2-GERD-GI prophylaxis -Continue pantoprazole  3-HYPERTENSION -Currently with soft blood pressure; not taking hypotensive agents at home -Gently provide IV fluid resucitation  -Follow vital signs -Follow heart healthy diet  4-hypothyroidism  -Continue levothyroxine -Check TSH  5-ARTHRITIS/chronic pain -Continue oxycodone  6-chronic iron deficiency Anemia -Continue ferrous sulfate -No signs of acute bleeding appreciated. -follow Hgb trend  7-ANXIETY  -Continue alprazolam as needed -mood stable at this time.   DVT prophylaxis: Heparin  Code Status: FULL  Family Communication: No family at bedside Disposition Plan: Hopefully back home once status improve Consults called: None  Admission  status: Inpatient, med-surg,  (LOS > 2 midnights)   Time Spent: 53 minutes  Barton Dubois MD [facility] Pager [phone]  If 7PM-7AM, please contact night-coverage [email or web address] Password [facility]  06/21/2018, 5:36 PM

## 2018-06-21 NOTE — Progress Notes (Signed)
Pharmacy Antibiotic Note  Marilyn Solomon is a 73 y.o. female admitted on 06/21/2018 with pneumonia.  Pharmacy has been consulted for vancomycin dosing.  Plan: Vancomycin 1000mg  IV every 24 hours.  Goal trough 15-20 mcg/mL.  Height: 5\' 5"  (165.1 cm) Weight: 107 lb (48.5 kg) IBW/kg (Calculated) : 57  Temp (24hrs), Avg:99.8 F (37.7 C), Min:99.8 F (37.7 C), Max:99.8 F (37.7 C)  Recent Labs  Lab 06/21/18 1118  WBC 14.8*  CREATININE 0.89  LATICACIDVEN 1.3    Estimated Creatinine Clearance: 44.4 mL/min (by C-G formula based on SCr of 0.89 mg/dL).    Allergies  Allergen Reactions  . Penicillins Itching    Has patient had a PCN reaction causing immediate rash, facial/tongue/throat swelling, SOB or lightheadedness with hypotension: no Has patient had a PCN reaction causing severe rash involving mucus membranes or skin necrosis: No Has patient had a PCN reaction that required hospitalization No Has patient had a PCN reaction occurring within the last 10 years: No If all of the above answers are "NO", then may proceed with Cephalosporin use.   . Vicodin [Hydrocodone-Acetaminophen] Nausea And Vomiting     Antimicrobials this admission: 1/24 vancomycin >>  1/24 aztreonam >>   Microbiology results: 1/24 BCx:  1/24 Sputum: sent  1/24 MRSA PCR: sent   Thank you for allowing pharmacy to be a part of this patient's care.  Donna Christen Donovan G[place] 06/21/2018 6:13 PM

## 2018-06-21 NOTE — ED Triage Notes (Signed)
Pt reports that she has been coughing for 2 days and reports SOB this morning. Sats 75% on room air in triage and HR 131. Pt placed on 3 L/via [place] in triage. Reports pain under left breast and left flank area when she breathes

## 2018-06-22 LAB — BASIC METABOLIC PANEL
Anion gap: 8 (ref 5–15)
BUN: 26 mg/dL — AB (ref 8–23)
CHLORIDE: 106 mmol/L (ref 98–111)
CO2: 25 mmol/L (ref 22–32)
Calcium: 8.3 mg/dL — ABNORMAL LOW (ref 8.9–10.3)
Creatinine, Ser: 0.73 mg/dL (ref 0.44–1.00)
GFR calc Af Amer: >60 mL/min (ref >60)
GFR calc non Af Amer: >60 mL/min (ref >60)
Glucose, Bld: 241 mg/dL — ABNORMAL HIGH (ref 70–99)
Potassium: 3.8 mmol/L (ref 3.5–5.1)
Sodium: 139 mmol/L (ref 135–145)

## 2018-06-22 LAB — CBC
HCT: 33.4 % — ABNORMAL LOW (ref 36.0–46.0)
Hemoglobin: 9 g/dL — ABNORMAL LOW (ref 12.0–15.0)
MCH: 24.4 pg — ABNORMAL LOW (ref 26.0–34.0)
MCHC: 26.9 g/dL — ABNORMAL LOW (ref 30.0–36.0)
MCV: 90.5 fL (ref 80.0–100.0)
Platelets: 276 10*3/uL (ref 150–400)
RBC: 3.69 MIL/uL — ABNORMAL LOW (ref 3.87–5.11)
RDW: 19.7 % — ABNORMAL HIGH (ref 11.5–15.5)
WBC: 6 10*3/uL (ref 4.0–10.5)
nRBC: 0 % (ref 0.0–0.2)

## 2018-06-22 LAB — MRSA PCR SCREENING: MRSA by PCR: NEGATIVE

## 2018-06-22 LAB — HIV ANTIBODY (ROUTINE TESTING W REFLEX): HIV Screen 4th Generation wRfx: NONREACTIVE

## 2018-06-22 LAB — STREP PNEUMONIAE URINARY ANTIGEN: Strep Pneumo Urinary Antigen: NEGATIVE

## 2018-06-22 MED ORDER — LEVOFLOXACIN IN D5W 750 MG/150ML IV SOLN
750.0000 mg | INTRAVENOUS | Status: DC
  Administered 2018-06-23: 750 mg via INTRAVENOUS
  Filled 2018-06-22: qty 150

## 2018-06-22 MED ORDER — ALBUTEROL SULFATE (2.5 MG/3ML) 0.083% IN NEBU
2.5000 mg | INHALATION_SOLUTION | Freq: Four times a day (QID) | RESPIRATORY_TRACT | Status: DC
  Administered 2018-06-22 – 2018-06-23 (×6): 2.5 mg via RESPIRATORY_TRACT
  Filled 2018-06-22 (×6): qty 3

## 2018-06-22 MED ORDER — BENZONATATE 100 MG PO CAPS
200.0000 mg | ORAL_CAPSULE | Freq: Three times a day (TID) | ORAL | Status: DC | PRN
  Administered 2018-06-22 – 2018-06-23 (×2): 200 mg via ORAL
  Filled 2018-06-22 (×2): qty 2

## 2018-06-22 MED ORDER — BUDESONIDE 0.5 MG/2ML IN SUSP
0.5000 mg | Freq: Every day | RESPIRATORY_TRACT | Status: DC
  Administered 2018-06-23: 0.5 mg via RESPIRATORY_TRACT
  Filled 2018-06-22: qty 2

## 2018-06-22 MED ORDER — ENSURE ENLIVE PO LIQD
237.0000 mL | Freq: Two times a day (BID) | ORAL | Status: DC
  Administered 2018-06-22 (×2): 237 mL via ORAL

## 2018-06-22 NOTE — Progress Notes (Signed)
Initial Nutrition Assessment  DOCUMENTATION CODES:  Underweight  INTERVENTION:  Ensure Enlive po BID, each supplement provides 350 kcal and 20 grams of protein  When able, would recommend obtaining bed weight to help assess wt trends/nutrition adequacy   NUTRITION DIAGNOSIS:  Increased nutrient needs related to catabolic illness(Pulmonary cachexia) as evidenced by moderate muscle/fat depletion   GOAL:  Patient will meet greater than or equal to 90% of their needs  MONITOR:  PO intake, Weight trends, Labs, I & O's, Supplement acceptance  REASON FOR ASSESSMENT:  Consult COPD Protocol  ASSESSMENT:  73 y/o female PMHx of chronic respiratory failure d/t COPD (supposed to be on 2-3 o2 at home), GERD, HTN, Anxiety. Recently Admitted to [facility] 1/5-1/7 for COPD exacerbation. Presented to ED d/t SOB and worsening, productive cough x2 days. Pt also report episode of fever, and n/v. In ED cxr showed likely Pna and pt admitted for management.   RD operating remotely on weekends. Remote assessment performed as part of COPD Gold order set.   Pt had been seen by RD in person during her hospitalization earlier in the month. Per that encounter, pt reported eats regularly and snacks in between meals as well. She apparently takes care of her granddaughter and this keeps her very busy.   Wt wise, she had been 114.5 lbs during a hospitalization for PNA almost exactly 1 year ago. Since March of 2019, she is documented as being 106-107 lbs, though most of these weights appear reported as opposed to measured. Will see if can obtain measured weight.   Per records, she ate 75% of her meals when admitted to hospital earlier this month. Hopefully this intake level will continue. Given her pulmonary cachexia and reported inability to gain weight, will add oral supplements.   Unable to conduct physical exam as RD off-site, though exam was conducted by RD 3 weeks ago and pt found to have moderate depletions of  muscle/fat. Doubtful this has changed acutely, especially as she has been ill recently.    Labs: H/H:9/33.4, Bg: 128-241,  Meds: Pepcid, iron, methylprednisolone, MVI with min, ppi, Vitamin D, IVF, IV Abx,   Recent Labs  Lab 06/21/18 1118 06/22/18 0645  NA 138 139  K 3.9 3.8  CL 101 106  CO2 26 25  BUN 20 26*  CREATININE 0.89 0.73  CALCIUM 8.6* 8.3*  GLUCOSE 128* 241*   NUTRITION - FOCUSED PHYSICAL EXAM: Unable to conduct  Diet Order:   Diet Order            Diet regular Room service appropriate? Yes; Fluid consistency: Thin  Diet effective now             EDUCATION NEEDS:  No education needs have been identified at this time  Skin:  Skin Assessment: Reviewed RN Assessment  Last BM:  1/23  Height:  Ht Readings from Last 1 Encounters:  06/21/18 5\' 5"  (1.651 m)   Weight:  Wt Readings from Last 1 Encounters:  06/21/18 48.5 kg   Wt Readings from Last 10 Encounters:  06/21/18 48.5 kg  06/02/18 48.5 kg  01/31/18 48.5 kg  10/03/17 48.1 kg  08/21/17 48.5 kg  06/30/17 53.8 kg  12/17/15 51.7 kg  11/15/15 51.7 kg  10/28/15 51.9 kg  09/20/15 50.8 kg   Ideal Body Weight:  56.82 kg  BMI:  Body mass index is 17.79 kg/m.  Estimated Nutritional Needs:  Kcal:  1650-1850 (24-38 kcal/kg bw) Protein:  73-87g Pro (1.5-1.8g/kg bw) Fluid:  1.6-1.8L fluid (  1 ml/kcal)  Burtis Junes RD, LDN, CNSC Clinical Nutrition Available Tues-Sat via Pager: [phone] 06/22/2018 9:44 AM

## 2018-06-22 NOTE — Progress Notes (Signed)
PROGRESS NOTE    Marilyn Solomon  OAC:[id number] DOB: [birth year] DOA: 06/21/2018 PCP: Lucianne Lei, MD     Brief Narrative:  73 y.o. female with PMH of chronic respiratory failure due to COPD (was supposed to be on oxygen supplementation at home, around 2-3 L), GERD, hypertension, hypothyroidism, arthritis, anemia and anxiety; who came to the ED due to shortness of breath and productive cough, that started approximately 2 days ago and has been getting worse since then. She described dark green thick sputum. Reported an episode of fever, nausea and vomiting. Denies recent travels, sick contacts, rhinorrhea, sore throat, hemoptysis, abdominal pain, dysuria, hematuria, changes in bowel movements, hematochezia or melena.  Of note, patient reports being of oxygen for couple days now after her oxygen tank ran out and has not been refilled.  In the ED patient was found warm to touch, with increase respiratory distress and oxygen saturation of 75% on room air.  She was also tachycardic with a heart rate in the 130s on presentation (albuterol nebulizer given by EMS in route).  Chest x-ray demonstrated new/acute lung infiltrates and she was found with elevated WBCs.  Cultures taken, fluid resuscitation given, started on steroids, nebulizer and antibiotics.  Influenza by PCR negative.  TRH call to place patient in the hospital for further evaluation and management.   Assessment & Plan: 1-acute on chronic respiratory failure with hypoxia -In the setting of H CAP and COPD exacerbation -Continue steroids, nebulizer management, flutter valve, antibiotics (given negative MRSA PCR will narrow to just Levaquin and discontinue aztreonam and vancomycin at this point) -Continue as needed antipyretics -Continue IV fluids and supportive care. -follow sputum and Blood culture results.  2-GERD/GI prophylaxis -Continue PPI  3-hypertension -Blood pressure stabilizing -Patient was not using any antihypertensive  agent at home -Follow vital signs.  4-hypothyroidism -Continue Synthroid -TSH within normal limits.  5-arthritis/chronic pain -Continue the use of as needed oxycodone.  6-chronic current deficiency anemia -Will continue ferrous sulfate -No signs of acute bleeding appreciated -Follow hemoglobin trend.  7-anxiety -Continue as needed alprazolam.  8-pleuritic chest pain -Appears to be associated with pneumonia and ongoing coughing from COPD exacerbation -Will use as needed Tessalon Perles and decrease the use of Mucinex to once a day.  See above for details.  DVT prophylaxis: Heparin Code Status: Full code Family Communication: No family at bedside Disposition Plan: Remains inpatient; continue the use of steroids, antibiotics, nebulizer and flutter valve.  Will decrease Mucinex to once a day and use as needed Tessalon Perles.  Continue oxygen supplementation and assess need at rest and on exertion..  Consultants:   None  Procedures:   See below for x-ray reports.  Antimicrobials:  Anti-infectives (From admission, onward)   Start     Dose/Rate Route Frequency Ordered Stop   06/23/18 1100  levofloxacin (LEVAQUIN) IVPB 750 mg     750 mg 100 mL/hr over 90 Minutes Intravenous Every 48 hours 06/22/18 0719     06/22/18 1800  vancomycin (VANCOCIN) IVPB 1000 mg/200 mL premix  Status:  Discontinued     1,000 mg 200 mL/hr over 60 Minutes Intravenous Every 24 hours 06/21/18 1813 06/22/18 0719   06/21/18 2200  aztreonam (AZACTAM) 2 g in sodium chloride 0.9 % 100 mL IVPB  Status:  Discontinued     2 g 200 mL/hr over 30 Minutes Intravenous Every 8 hours 06/21/18 1752 06/22/18 0719   06/21/18 1815  vancomycin (VANCOCIN) IVPB 1000 mg/200 mL premix     1,000 mg 200  mL/hr over 60 Minutes Intravenous  Once 06/21/18 1811     06/21/18 1230  levofloxacin (LEVAQUIN) IVPB 750 mg     750 mg 100 mL/hr over 90 Minutes Intravenous  Once 06/21/18 1218 06/21/18 1431       Subjective: Reports  some pleuritic chest pain from coughing; still short of breath with minimal exertion and having ability to speak in full sentences.  Patient reports chills.  Objective: Vitals:   06/22/18 0525 06/22/18 0526 06/22/18 0732 06/22/18 1400  BP: 105/64 105/64  (!) 100/54  Pulse: 84 81  92  Resp: 20 20  18   Temp: 97.6 F (36.4 C) 97.6 F (36.4 C)  97.9 F (36.6 C)  TempSrc: Oral Oral  Oral  SpO2: 100% 100% 98% 99%  Weight:      Height:        Intake/Output Summary (Last 24 hours) at 06/22/2018 1717 Last data filed at 06/22/2018 0300 Gross per 24 hour  Intake 582.63 ml  Output -  Net 582.63 ml   Filed Weights   06/21/18 1029 06/21/18 1913  Weight: 48.5 kg 48.5 kg    Examination: General exam: Alert, awake, oriented x 3; reports some improvement; but is still short of breath with minimal exertion, having productive coughing spells, expressing chills and demonstrating inability to speak in full sentences. Respiratory system: Positive rhonchi and diffuse expiratory wheezing appreciated on exam; no using accessory muscles. Cardiovascular system:RRR. No murmurs, rubs, gallops. Gastrointestinal system: Abdomen is nondistended, soft and nontender. No organomegaly or masses felt. Normal bowel sounds heard. Central nervous system: Alert and oriented. No focal neurological deficits. Extremities: No C/C/E, +pedal pulses Skin: No rashes, lesions or ulcers Psychiatry: Judgement and insight appear normal. Mood & affect appropriate.     Data Reviewed: I have personally reviewed following labs and imaging studies  CBC: Recent Labs  Lab 06/21/18 1118 06/22/18 0819  WBC 14.8* 6.0  HGB 10.5* 9.0*  HCT 37.1 33.4*  MCV 87.3 90.5  PLT 343 664   Basic Metabolic Panel: Recent Labs  Lab 06/21/18 1118 06/22/18 0645  NA 138 139  K 3.9 3.8  CL 101 106  CO2 26 25  GLUCOSE 128* 241*  BUN 20 26*  CREATININE 0.89 0.73  CALCIUM 8.6* 8.3*   GFR: Estimated Creatinine Clearance: 49.4 mL/min  (by C-G formula based on SCr of 0.73 mg/dL).  Coagulation Profile: Recent Labs  Lab 06/21/18 1118  INR 1.15   Cardiac Enzymes: Recent Labs  Lab 06/21/18 1118  TROPONINI <0.03   Thyroid Function Tests: Recent Labs    06/21/18 1100  TSH 1.298   Urine analysis:    Component Value Date/Time   COLORURINE AMBER (A) 06/21/2018 1450   APPEARANCEUR CLOUDY (A) 06/21/2018 1450   LABSPEC 1.020 06/21/2018 1450   PHURINE 5.0 06/21/2018 1450   GLUCOSEU NEGATIVE 06/21/2018 1450   HGBUR NEGATIVE 06/21/2018 1450   BILIRUBINUR NEGATIVE 06/21/2018 1450   KETONESUR NEGATIVE 06/21/2018 1450   PROTEINUR 30 (A) 06/21/2018 1450   UROBILINOGEN 0.2 03/25/2008 1702   NITRITE NEGATIVE 06/21/2018 1450   LEUKOCYTESUR TRACE (A) 06/21/2018 1450    Recent Results (from the past 240 hour(s))  Culture, blood (Routine x 2)     Status: None (Preliminary result)   Collection Time: 06/21/18 11:18 AM  Result Value Ref Range Status   Specimen Description BLOOD BLOOD RIGHT WRIST  Final   Special Requests   Final    BOTTLES DRAWN AEROBIC AND ANAEROBIC Blood Culture adequate volume Performed at  [facility], [address], [place], [place] [address]    Culture PENDING  Incomplete   Report Status PENDING  Incomplete  Culture, blood (Routine x 2)     Status: None (Preliminary result)   Collection Time: 06/21/18 12:05 PM  Result Value Ref Range Status   Specimen Description BLOOD LEFT ANTECUBITAL  Final   Special Requests   Final    BOTTLES DRAWN AEROBIC AND ANAEROBIC Blood Culture adequate volume Performed at [facility], [address], [place], [place] [address]    Culture PENDING  Incomplete   Report Status PENDING  Incomplete  MRSA PCR Screening     Status: None   Collection Time: 06/21/18  6:11 PM  Result Value Ref Range Status   MRSA by PCR NEGATIVE NEGATIVE Final    Comment:        The GeneXpert MRSA Assay (FDA approved for NASAL specimens only), is one component of a comprehensive MRSA  colonization surveillance program. It is not intended to diagnose MRSA infection nor to guide or monitor treatment for MRSA infections. Performed at [facility], [address] [place], [place] [address]      Radiology Studies: Dg Chest 2 View  Result Date: 06/21/2018 CLINICAL DATA:  Cough for 2 days.  Shortness of breath. EXAM: CHEST - 2 VIEW COMPARISON:  June 02, 2018 FINDINGS: Increasing infiltrate in the bases, left greater than right. Chronic changes throughout the remainder of the lungs are stable. The cardiomediastinal silhouette is unchanged. Small effusions. IMPRESSION: 1. Increasing infiltrate in the bases, left greater than right, consistent with pneumonia or aspiration. Tiny effusions are stable. Other chronic changes are stable. Electronically Signed   By: Dorise Bullion III M.D   On: 06/21/2018 11:59    Scheduled Meds: . albuterol  2.5 mg Nebulization Q6H WA  . [START ON 06/23/2018] budesonide (PULMICORT) nebulizer solution  0.5 mg Nebulization Daily  . dextromethorphan-guaiFENesin  1 tablet Oral BID  . famotidine  20 mg Oral Daily  . feeding supplement (ENSURE ENLIVE)  237 mL Oral BID BM  . ferrous sulfate  325 mg Oral Daily  . heparin  5,000 Units Subcutaneous Q8H  . levothyroxine  75 mcg Oral Q0600  . methylPREDNISolone (SOLU-MEDROL) injection  60 mg Intravenous Q8H  . multivitamin with minerals  1 tablet Oral QPC supper  . pantoprazole  40 mg Oral Daily  . umeclidinium bromide  1 puff Inhalation Daily  . Vitamin D (Ergocalciferol)  50,000 Units Oral Q Fri   Continuous Infusions: . [START ON 06/23/2018] levofloxacin (LEVAQUIN) IV    . vancomycin       LOS: 1 day    Time spent: 30 minutes.    Barton Dubois, MD [facility] Pager [phone]  06/22/2018, 5:17 PM

## 2018-06-23 MED ORDER — LEVOFLOXACIN 750 MG PO TABS: 750.0000 mg | ORAL_TABLET | Freq: Every day | ORAL | 0 refills | Status: AC

## 2018-06-23 MED ORDER — ENSURE ENLIVE PO LIQD: 237.0000 mL | Freq: Two times a day (BID) | ORAL | 12 refills | Status: DC

## 2018-06-23 MED ORDER — PREDNISONE 20 MG PO TABS: ORAL_TABLET | ORAL | 0 refills | Status: DC

## 2018-06-23 MED ORDER — FLUTICASONE-SALMETEROL 250-50 MCG/DOSE IN AEPB: 1.0000 | INHALATION_SPRAY | Freq: Two times a day (BID) | RESPIRATORY_TRACT | 3 refills | Status: DC

## 2018-06-23 MED ORDER — BENZONATATE 200 MG PO CAPS: 200.0000 mg | ORAL_CAPSULE | Freq: Three times a day (TID) | ORAL | 0 refills | Status: DC | PRN

## 2018-06-23 NOTE — Discharge Summary (Signed)
Physician Discharge Summary  Marilyn Solomon QIW:[id number] DOB: 10/29/1945 DOA: 06/21/2018  PCP: Lucianne Lei, MD  Admit date: 06/21/2018 Discharge date: 06/23/2018  Time spent: 35 minutes  Recommendations for Outpatient Follow-up:  1. Pete basic metabolic panel to follow electrolytes and renal function 2. Repeat x-ray 8 in 4-6 weeks to assure complete resolution of infiltrates 3. Reassess blood pressure and if needed initiate treatment with antihypertensive regimen 4. Assess response to new medication for COPD and if required make arrangement for outpatient follow-up with pulmonologist for PFTs and further management.   Discharge Diagnoses:  Principal Problem:   Acute on chronic respiratory failure with hypoxemia (HCC) Active Problems:   HCAP (healthcare-associated pneumonia)   Anxiety   Hypothyroidism   GERD (gastroesophageal reflux disease)   COPD with acute exacerbation (HCC)   Hypertension   Discharge Condition: Stable and improved.  Patient discharged home with instruction to follow-up with PCP in 10 days.  Diet recommendation: Heart healthy diet  Filed Weights   06/21/18 1029 06/21/18 1913  Weight: 48.5 kg 48.5 kg    History of present illness:  73 y.o.femalewith PMH ofchronic respiratory failure due toCOPD(was supposed to be on oxygen supplementation at home, around 2-3 L), GERD, hypertension, hypothyroidism, arthritis, anemia and anxiety;who came to the ED due to shortness of breath and productive cough,that started approximately 2 days ago and has been getting worse since then. She described dark green thick sputum. Reported an episode of fever, nausea and vomiting. Denies recent travels, sick contacts, rhinorrhea, sore throat, hemoptysis, abdominal pain, dysuria,hematuria, changes in bowel movements, hematochezia or melena.  Of note, patient reports being of oxygen for couple days now after her oxygen tank ran out and has not been refilled.  In the ED  patient was found warm to touch, with increase respiratory distress and oxygen saturation of 75% on room air. She was also tachycardic with a heart rate in the 130s on presentation (albuterol nebulizer given by EMS in route). Chest x-ray demonstrated new/acute lung infiltrates and she was found with elevated WBCs. Cultures taken, fluid resuscitation given, started on steroids, nebulizer and antibiotics. Influenza by PCR negative. TRH call to place patient in the hospital for further evaluation and management.   Hospital Course:  1-acute on chronic respiratory failure with hypoxia -In the setting of HCAP and COPD exacerbation -Patient will be discharged on prednisone tapering, continue use of Levaquin, will continue as needed albuterol for rescue and start advair BID. -Patient afebrile and with normal WBCs at discharge. -Instructed to keep herself well-hydrated -follow final sputum and Blood culture results; at discharge no growth appreciated.  2-GERD/GI prophylaxis -Continue PPI  3-hypertension -Blood pressure stable -Patient was not using any antihypertensive agent at home -Continue heart healthy diet at discharge.  4-hypothyroidism -Continue Synthroid -TSH within normal limits.  5-arthritis/chronic pain -Continue the use of as needed oxycodone.  6-chronic current deficiency anemia -Will continue ferrous sulfate -No signs of acute bleeding appreciated -Follow hemoglobin trend follow-up visit.Marland Kitchen  7-anxiety -Continue as needed alprazolam.  8-pleuritic chest pain -Appears to be associated with pneumonia and ongoing coughing from COPD exacerbation -Will use as needed Tessalon Perles  -Pleat antibiotics therapy as mentioned above -Patient advised to keep herself well-hydrated.  Procedures:  See below for x-ray reports.  Consultations:  None  Discharge Exam: Vitals:   06/23/18 1509 06/23/18 1523  BP:    Pulse:    Resp:    Temp:    SpO2: 100% 95%     General: Afebrile, oriented x3;  having some shortness of breath with exertion but overall speaking in full sentences and with good saturation on 2.5-3 L nasal cannula.  Having intermittent episode of coughing spells. Cardiovascular: S1 and S2, no rubs, no gallops; positive soft systolic ejection murmur. Respiratory: Positive rhonchi, very mild expiratory wheezing, no using accessory muscles, no crackles, normal respiratory effort. Abdomen: Soft, nontender, nondistended, positive bowel sounds Extremities: No edema, no cyanosis, no clubbing.  Discharge Instructions   Discharge Instructions    Discharge instructions   Complete by:  As directed    Take medications as prescribed Keep yourself well-hydrated Arrange follow-up with PCP in 10 days Wear oxygen supplementation 2.5-3 L nasal cannula 24/7.   Increase activity slowly   Complete by:  As directed      Allergies as of 06/23/2018      Reactions   Penicillins Itching   Has patient had a PCN reaction causing immediate rash, facial/tongue/throat swelling, SOB or lightheadedness with hypotension: no Has patient had a PCN reaction causing severe rash involving mucus membranes or skin necrosis: No Has patient had a PCN reaction that required hospitalization No Has patient had a PCN reaction occurring within the last 10 years: No If all of the above answers are "NO", then may proceed with Cephalosporin use.   Vicodin [hydrocodone-acetaminophen] Nausea And Vomiting      Medication List    TAKE these medications   ALPRAZolam 1 MG tablet Commonly known as:  XANAX Take 1 mg by mouth every 8 (eight) hours as needed for anxiety.   benzonatate 200 MG capsule Commonly known as:  TESSALON Take 1 capsule (200 mg total) by mouth 3 (three) times daily as needed (Excessive coughing spells).   feeding supplement (ENSURE ENLIVE) Liqd Take 237 mLs by mouth 2 (two) times daily between meals. Start taking on:  June 24, 2018   ferrous  sulfate 325 (65 FE) MG tablet Take 1 tablet (325 mg total) by mouth daily.   Fluticasone-Salmeterol 250-50 MCG/DOSE Aepb Commonly known as:  ADVAIR DISKUS Inhale 1 puff into the lungs 2 (two) times daily.   ipratropium-albuterol 0.5-2.5 (3) MG/3ML Soln Commonly known as:  DUONEB Take 3 mLs by nebulization every 6 (six) hours as needed (shortness of breath or wheezing).   levofloxacin 750 MG tablet Commonly known as:  LEVAQUIN Take 1 tablet (750 mg total) by mouth daily for 7 days.   levothyroxine 75 MCG tablet Commonly known as:  SYNTHROID, LEVOTHROID Take 75 mcg by mouth daily.   multivitamin with minerals Tabs tablet Take 1 tablet by mouth daily after supper.   oxyCODONE 15 MG immediate release tablet Commonly known as:  ROXICODONE Take 15 mg by mouth 3 (three) times daily as needed for pain.   pantoprazole 40 MG tablet Commonly known as:  PROTONIX TAKE 1 TABLET BY MOUTH DAILY. What changed:  how much to take   predniSONE 20 MG tablet Commonly known as:  DELTASONE Take 3 tablets by mouth daily x1 day; then 2 tablets by mouth daily x2 days; then 1 tablet by mouth daily x3 days; then half tablet by mouth daily x3 days and stop prednisone.   Vitamin D (Ergocalciferol) 1.25 MG (50000 UT) Caps capsule Commonly known as:  DRISDOL Take 50,000 Units by mouth every Friday.            Durable Medical Equipment  (From admission, onward)         Start     Ordered   06/23/18 1202  For home use  only DME oxygen  Once    Question Answer Comment  Mode or (Route) Nasal cannula   Liters per Minute 3   Frequency Continuous (stationary and portable oxygen unit needed)   Oxygen delivery system Gas      06/23/18 1201         Allergies  Allergen Reactions  . Penicillins Itching    Has patient had a PCN reaction causing immediate rash, facial/tongue/throat swelling, SOB or lightheadedness with hypotension: no Has patient had a PCN reaction causing severe rash involving  mucus membranes or skin necrosis: No Has patient had a PCN reaction that required hospitalization No Has patient had a PCN reaction occurring within the last 10 years: No If all of the above answers are "NO", then may proceed with Cephalosporin use.   . Vicodin [Hydrocodone-Acetaminophen] Nausea And Vomiting   Follow-up Information    Lucianne Lei, MD. Schedule an appointment as soon as possible for a visit in 10 day(s).   Specialty:  Family Medicine Contact information: [address] [place] [address] [phone]           The results of significant diagnostics from this hospitalization (including imaging, microbiology, ancillary and laboratory) are listed below for reference.    Significant Diagnostic Studies: Dg Chest 2 View  Result Date: 06/21/2018 CLINICAL DATA:  Cough for 2 days.  Shortness of breath. EXAM: CHEST - 2 VIEW COMPARISON:  June 02, 2018 FINDINGS: Increasing infiltrate in the bases, left greater than right. Chronic changes throughout the remainder of the lungs are stable. The cardiomediastinal silhouette is unchanged. Small effusions. IMPRESSION: 1. Increasing infiltrate in the bases, left greater than right, consistent with pneumonia or aspiration. Tiny effusions are stable. Other chronic changes are stable. Electronically Signed   By: Dorise Bullion III M.D   On: 06/21/2018 11:59   Dg Chest 2 View  Result Date: 06/02/2018 CLINICAL DATA:  Short of breath and dry cough for the past 2 days. History of COPD. EXAM: CHEST - 2 VIEW COMPARISON:  01/31/2018 FINDINGS: Cardiac silhouette is normal in size. No mediastinal or hilar masses. Lungs are hyperexpanded with chronic fibrotic changes stable from prior study. No evidence of pneumonia or pulmonary edema. No pleural effusion or pneumothorax. No acute skeletal abnormality IMPRESSION: 1. No acute cardiopulmonary disease. 2. COPD and lung fibrosis stable from the prior study. Electronically Signed   By: Lajean Manes M.D.   On: 06/02/2018 18:42    Microbiology: Recent Results (from the past 240 hour(s))  Culture, blood (Routine x 2)     Status: None (Preliminary result)   Collection Time: 06/21/18 11:18 AM  Result Value Ref Range Status   Specimen Description BLOOD BLOOD RIGHT WRIST  Final   Special Requests   Final    BOTTLES DRAWN AEROBIC AND ANAEROBIC Blood Culture adequate volume   Culture   Final    NO GROWTH 2 DAYS Performed at [facility], [address] [place], [place] [address]    Report Status PENDING  Incomplete  Culture, blood (Routine x 2)     Status: None (Preliminary result)   Collection Time: 06/21/18 12:05 PM  Result Value Ref Range Status   Specimen Description BLOOD LEFT ANTECUBITAL  Final   Special Requests   Final    BOTTLES DRAWN AEROBIC AND ANAEROBIC Blood Culture adequate volume   Culture   Final    NO GROWTH 2 DAYS Performed at [facility], [address] [place],  [address]  Report Status PENDING  Incomplete  MRSA PCR Screening     Status: None   Collection Time: 06/21/18  6:11 PM  Result Value Ref Range Status   MRSA by PCR NEGATIVE NEGATIVE Final    Comment:        The GeneXpert MRSA Assay (FDA approved for NASAL specimens only), is one component of a comprehensive MRSA colonization surveillance program. It is not intended to diagnose MRSA infection nor to guide or monitor treatment for MRSA infections. Performed at [facility], [address] [place], [place] [address]      Labs: Basic Metabolic Panel: Recent Labs  Lab 06/21/18 1118 06/22/18 0645  NA 138 139  K 3.9 3.8  CL 101 106  CO2 26 25  GLUCOSE 128* 241*  BUN 20 26*  CREATININE 0.89 0.73  CALCIUM 8.6* 8.3*   CBC: Recent Labs  Lab 06/21/18 1118 06/22/18 0819  WBC 14.8* 6.0  HGB 10.5* 9.0*  HCT 37.1 33.4*  MCV 87.3 90.5  PLT 343 276   Cardiac Enzymes: Recent Labs  Lab 06/21/18 1118  TROPONINI <0.03   BNP: BNP (last 3 results) Recent Labs     06/02/18 1731  BNP 26.0    Signed:  Barton Dubois MD.  [facility] 06/23/2018, 4:13 PM

## 2018-06-23 NOTE — Progress Notes (Signed)
Dc instructions given to pt. Educated on med changes, followup appointment, O2 use / delivery. Pt verbalized an understanding of all. All questions answers. Await home care for O2 for transport home.

## 2018-06-23 NOTE — Care Management Note (Signed)
Case Management Note  Patient Details  Name: Marilyn Solomon MRN: [id number] Date of Birth: 01/02/1946  Subjective/Objective: Patient needs home O2.  Last discharge in early January reports that DME was set up but patient reports that her tank ran out and it was never refilled.  [facility] was the O2 supplier.  Melene Muller with [facility] notified of patient need, he will review orders and arrange for oxygen delivery.  Delivery to [facility] will take about 2 hrs.  RN contacted and she will put a progress not in the chart with qualifying O2 sats.   RNCM instructed staff RN to call if any additional questions arrise related to the DME.  Doran Clay RN BSN [phone]                     Action/Plan:   Expected Discharge Date:  06/23/18               Expected Discharge Plan:  Home/Self Care  In-House Referral:     Discharge planning Services  CM Consult  Post Acute Care Choice:  Durable Medical Equipment Choice offered to:  Patient  DME Arranged:  Oxygen DME Agency:  [place]:    [facility] Agency:     Status of Service:  Completed, signed off  If discussed at [place] of Stay Meetings, dates discussed:    Additional Comments:  Shelbie Hutching, RN 06/23/2018, 4:31 PM

## 2018-06-23 NOTE — Progress Notes (Signed)
O2 sat 80 % -89% on RA with and without ambulation, Q2 sat w/ ambulation @ 2.5 L = 98%. Will notify CM for O2 setup.

## 2018-06-24 ENCOUNTER — Other Ambulatory Visit: Payer: Self-pay

## 2018-06-24 DIAGNOSIS — J441 Chronic obstructive pulmonary disease with (acute) exacerbation: Secondary | ICD-10-CM

## 2018-06-24 LAB — LEGIONELLA PNEUMOPHILA SEROGP 1 UR AG: L. pneumophila Serogp 1 Ur Ag: NEGATIVE

## 2018-06-25 NOTE — ED Provider Notes (Signed)
[facility] Emergency Department Provider Note MRN:  [id number]  Arrival date & time: 06/25/18     Chief Complaint   Cough   History of Present Illness   Marilyn Solomon is a 72 y.o. year-old female with a history of COPD presenting to the ED with chief complaint of cough.  3 days of progressively worsening cough and shortness of breath.  Shortness of breath located in the chest, constant, progressively worsening.  Denies fever, no sore throat, no nasal congestion, no body aches.  Explains that she was discharged earlier in the month and was supposed to be using oxygen at home, but the canister ran out of oxygen.  Denies chest pain, no abdominal pain.  No exacerbating relieving factors.  Review of Systems  A complete 10 system review of systems was obtained and all systems are negative except as noted in the HPI and PMH.   Patient's Health History    Past Medical History:  Diagnosis Date  . Anemia   . Anxiety   . Arthritis   . Chronic back pain   . COPD (chronic obstructive pulmonary disease) ([place])   . GERD (gastroesophageal reflux disease)   . Hypertension   . Hypothyroidism   . Reflux    no medications currently, asymptomatic  . Thyroid disease     Past Surgical History:  Procedure Laterality Date  . ABDOMINAL HYSTERECTOMY    . BIOPSY  10/08/2017   Procedure: BIOPSY;  Surgeon: Daneil Dolin, MD;  [facility];  Service: Endoscopy;;  gastric  . cataracts Bilateral   . COLONOSCOPY  2007   Dr. Gala Romney: normal rectum, diverticula   . COLONOSCOPY WITH PROPOFOL N/A 11/15/2015   Dr. Gala Romney: grade II hemorrhoids, diverticulosis  . COLONOSCOPY WITH PROPOFOL N/A 10/08/2017   Procedure: COLONOSCOPY WITH PROPOFOL;  Surgeon: Daneil Dolin, MD;  Location: [facility];  Service: Endoscopy;  Laterality: N/A;  11:15am  . ESOPHAGOGASTRODUODENOSCOPY (EGD) WITH PROPOFOL N/A 10/08/2017   Procedure: ESOPHAGOGASTRODUODENOSCOPY (EGD) WITH PROPOFOL;  Surgeon:  Daneil Dolin, MD;  Location[facility];  Service: Endoscopy;  Laterality: N/A;  . FRACTURE SURGERY Right 2005   Wrist  . JOINT REPLACEMENT Left 2000   hip  . TOTAL HIP ARTHROPLASTY Left   . WRIST SURGERY Right     Family History  Problem Relation Age of Onset  . Bone cancer Sister   . Pancreatic cancer Sister   . Pneumonia Mother   . Heart attack Father   . Diabetes Sister   . Dementia Sister   . Colon cancer Neg Hx     Social History   Socioeconomic History  . Marital status: Widowed    Spouse name: Not on file  . Number of children: Not on file  . Years of education: Not on file  . Highest education level: Not on file  Occupational History  . Occupation: disability  Social Needs  . Financial resource strain: Patient refused  . Food insecurity:    Worry: Patient refused    Inability: Patient refused  . Transportation needs:    Medical: Patient refused    Non-medical: Patient refused  Tobacco Use  . Smoking status: Former Smoker    Packs/day: 25.00    Years: 1.00    Pack years: 25.00    Types: Cigarettes    Last attempt to quit: 08/27/1998    Years since quitting: 19.8  . Smokeless tobacco: Never Used  . Tobacco comment: quit in  2000  Substance and Sexual Activity  . Alcohol use: No    Alcohol/week: 0.0 standard drinks  . Drug use: No  . Sexual activity: Yes    Birth control/protection: Surgical  Lifestyle  . Physical activity:    Days per week: Patient refused    Minutes per session: Patient refused  . Stress: Patient refused  Relationships  . Social connections:    Talks on phone: Patient refused    Gets together: Patient refused    Attends religious service: Patient refused    Active member of club or organization: Patient refused    Attends meetings of clubs or organizations: Patient refused    Relationship status: Patient refused  . Intimate partner violence:    Fear of current or ex partner: Patient refused    Emotionally abused: Patient  refused    Physically abused: Patient refused    Forced sexual activity: Patient refused  Other Topics Concern  . Not on file  Social History Narrative  . Not on file     Physical Exam  Vital Signs and Nursing Notes reviewed Vitals:   06/23/18 1523 06/23/18 1942  BP:    Pulse:    Resp:    Temp:    SpO2: 95% 96%    CONSTITUTIONAL: Chronically ill-appearing, NAD NEURO:  Alert and oriented x 3, no focal deficits EYES:  eyes equal and reactive ENT/NECK:  no LAD, no JVD CARDIO: Tachycardic rate, well-perfused, normal S1 and S2 PULM: Scattered wheezes GI/GU:  normal bowel sounds, non-distended, non-tender MSK/SPINE:  No gross deformities, no edema SKIN:  no rash, atraumatic PSYCH:  Appropriate speech and behavior  Diagnostic and Interventional Summary    EKG Interpretation  Date/Time:  Friday June 21 2018 12:44:56 EST Ventricular Rate:  117 PR Interval:  126 QRS Duration: 85 QT Interval:  317 QTC Calculation: 443 R Axis:   82 Text Interpretation:  Sinus tachycardia Right atrial enlargement Borderline right axis deviation Artifact in lead(s) I III aVL and baseline wander in lead(s) II  Poor data quality, interpretation may be adversely affected Confirmed by Addison Lank [phone]) on 06/22/2018 4:32:38 PM      Labs Reviewed  CBC - Abnormal; Notable for the following components:      Result Value   WBC 14.8 (*)    Hemoglobin 10.5 (*)    MCH 24.7 (*)    MCHC 28.3 (*)    RDW 20.6 (*)    All other components within normal limits  BASIC METABOLIC PANEL - Abnormal; Notable for the following components:   Glucose, Bld 128 (*)    Calcium 8.6 (*)    All other components within normal limits  URINALYSIS, ROUTINE W REFLEX MICROSCOPIC - Abnormal; Notable for the following components:   Color, Urine AMBER (*)    APPearance CLOUDY (*)    Protein, ur 30 (*)    Leukocytes, UA TRACE (*)    Bacteria, UA RARE (*)    All other components within normal limits  BASIC METABOLIC  PANEL - Abnormal; Notable for the following components:   Glucose, Bld 241 (*)    BUN 26 (*)    Calcium 8.3 (*)    All other components within normal limits  CBC - Abnormal; Notable for the following components:   RBC 3.69 (*)    Hemoglobin 9.0 (*)    HCT 33.4 (*)    MCH 24.4 (*)    MCHC 26.9 (*)    RDW 19.7 (*)    All  other components within normal limits  CULTURE, BLOOD (ROUTINE X 2)  CULTURE, BLOOD (ROUTINE X 2)  MRSA PCR SCREENING  GRAM STAIN  EXPECTORATED SPUTUM ASSESSMENT W REFEX TO RESP CULTURE  TROPONIN I  LACTIC ACID, PLASMA  PROTIME-INR  INFLUENZA PANEL BY PCR (TYPE A & B)  TSH  HIV ANTIBODY (ROUTINE TESTING W REFLEX)  STREP PNEUMONIAE URINARY ANTIGEN  LEGIONELLA PNEUMOPHILA SEROGP 1 UR AG    DG Chest 2 View  Final Result      Medications  0.9 %  sodium chloride infusion ( Intravenous Stopped 06/22/18 2015)  albuterol (PROVENTIL) (2.5 MG/3ML) 0.083% nebulizer solution 5 mg (5 mg Nebulization Given 06/21/18 1058)  ipratropium-albuterol (DUONEB) 0.5-2.5 (3) MG/3ML nebulizer solution 3 mL (3 mLs Nebulization Given 06/21/18 1136)  methylPREDNISolone sodium succinate (SOLU-MEDROL) 125 mg/2 mL injection 125 mg (125 mg Intravenous Given 06/21/18 1141)  sodium chloride flush (NS) 0.9 % injection 3 mL (3 mLs Intravenous Given 06/21/18 1142)  levofloxacin (LEVAQUIN) IVPB 750 mg (0 mg Intravenous Stopped 06/21/18 1431)  sodium chloride 0.9 % bolus 500 mL (0 mLs Intravenous Stopped 06/21/18 1432)     Procedures Critical Care  ED Course and Medical Decision Making  I have reviewed the triage vital signs and the nursing notes.  Pertinent labs & imaging results that were available during my care of the patient were reviewed by me and considered in my medical decision making (see below for details).  Uncomplicated COPD exacerbation with hypoxic respiratory failure in this 73 year old female with history of the same, improving to 95+ percent with supplemental oxygen.  Appears  comfortable with this oxygen that she was supposed to be using at home.  Admitted to hospital service for further care.  Barth Kirks. Sedonia Small, MD [place] [email or web address]  Final Clinical Impressions(s) / ED Diagnoses  No diagnosis found.  ED Discharge Orders         Ordered    feeding supplement, ENSURE ENLIVE, (ENSURE ENLIVE) LIQD  2 times daily between meals     06/23/18 1608    benzonatate (TESSALON) 200 MG capsule  3 times daily PRN     06/23/18 1608    Fluticasone-Salmeterol (ADVAIR DISKUS) 250-50 MCG/DOSE AEPB  2 times daily     06/23/18 1608    levofloxacin (LEVAQUIN) 750 MG tablet  Daily     06/23/18 1608    predniSONE (DELTASONE) 20 MG tablet     06/23/18 1608    Increase activity slowly     06/23/18 1608    Discharge instructions    Comments:  Take medications as prescribed Keep yourself well-hydrated Arrange follow-up with PCP in 10 days Wear oxygen supplementation 2.5-3 L nasal cannula 24/7.   06/23/18 1608             Maudie Flakes, MD 06/25/18 [phone]

## 2018-06-26 ENCOUNTER — Ambulatory Visit: Payer: Self-pay

## 2018-06-26 ENCOUNTER — Other Ambulatory Visit: Payer: Self-pay

## 2018-06-26 LAB — CULTURE, BLOOD (ROUTINE X 2)
CULTURE: NO GROWTH
Culture: NO GROWTH
Special Requests: ADEQUATE
Special Requests: ADEQUATE

## 2018-06-26 NOTE — Patient Outreach (Signed)
[place] [facility]) Care Management  06/26/2018  Marilyn Solomon Aug 20, 1945 [id number]    Unsuccessful outreach attempt. Left HIPAA compliant voice message requesting a return call.  PLAN Will follow up in 3-4 business days.   [place] [phone]

## 2018-06-27 DIAGNOSIS — J441 Chronic obstructive pulmonary disease with (acute) exacerbation: Secondary | ICD-10-CM | POA: Diagnosis not present

## 2018-07-01 ENCOUNTER — Other Ambulatory Visit: Payer: Self-pay

## 2018-07-01 NOTE — Patient Outreach (Signed)
[place] [facility]) Care Management  [place]  07/01/2018   Marilyn Solomon [birth year] [id number]  Subjective:  Member referred to community [facility] for complex care management. Telephonic assessment complete.   Objective:   Encounter Medications:  Outpatient Encounter Medications as of 07/01/2018  Medication Sig  . ferrous sulfate 325 (65 FE) MG tablet Take 1 tablet (325 mg total) by mouth daily.  . Fluticasone-Salmeterol (ADVAIR DISKUS) 250-50 MCG/DOSE AEPB Inhale 1 puff into the lungs 2 (two) times daily.  Marland Kitchen ipratropium-albuterol (DUONEB) 0.5-2.5 (3) MG/3ML SOLN Take 3 mLs by nebulization every 6 (six) hours as needed (shortness of breath or wheezing).  Marland Kitchen levothyroxine (SYNTHROID, LEVOTHROID) 75 MCG tablet Take 75 mcg by mouth daily.    . Multiple Vitamin (MULTIVITAMIN WITH MINERALS) TABS tablet Take 1 tablet by mouth daily after supper.  Marland Kitchen oxyCODONE (ROXICODONE) 15 MG immediate release tablet Take 15 mg by mouth 3 (three) times daily as needed for pain.   . pantoprazole (PROTONIX) 40 MG tablet TAKE 1 TABLET BY MOUTH DAILY. (Patient taking differently: Take 40 mg by mouth daily. )  . predniSONE (DELTASONE) 20 MG tablet Take 3 tablets by mouth daily x1 day; then 2 tablets by mouth daily x2 days; then 1 tablet by mouth daily x3 days; then half tablet by mouth daily x3 days and stop prednisone.  . Vitamin D, Ergocalciferol, (DRISDOL) 50000 UNITS CAPS capsule Take 50,000 Units by mouth every Friday.   . ALPRAZolam (XANAX) 1 MG tablet Take 1 mg by mouth every 8 (eight) hours as needed for anxiety.   . benzonatate (TESSALON) 200 MG capsule Take 1 capsule (200 mg total) by mouth 3 (three) times daily as needed (Excessive coughing spells).  . feeding supplement, ENSURE ENLIVE, (ENSURE ENLIVE) LIQD Take 237 mLs by mouth 2 (two) times daily between meals.   No facility-administered encounter medications on file as of 07/01/2018.     Functional Status:  In your present state of  health, do you have any difficulty performing the following activities: 07/01/2018 06/21/2018  Hearing? N N  Comment - -  Vision? N N  Difficulty concentrating or making decisions? N N  Walking or climbing stairs? N N  Comment - -  Dressing or bathing? N N  Doing errands, shopping? N N  Preparing Food and eating ? N -  Using the Toilet? N -  In the past six months, have you accidently leaked urine? N -  Do you have problems with loss of bowel control? N -  Managing your Medications? N -  Managing your Finances? N -  Housekeeping or managing your Housekeeping? N -  Some recent data might be hidden    Fall/Depression Screening: Fall Risk  07/01/2018  Falls in the past year? 0  Number falls in past yr: 0   PHQ 2/9 Scores 07/01/2018  PHQ - 2 Score 0    Assessment:  Successful outreach with Marilyn Solomon. She reported feeling well today. No complaints of shortness of breath or chest discomfort. Reported using pillbox for medication preparation and taking as prescribed. Denied current concerns regarding medication management or affordability. Reported compliance with home oxygen use and monitoring oxygen saturations. Reported average readings of 97% at rest. Denied worsening symptoms since hospital discharge. Reported ambulating well in the home and denied falls. Member drives and reported family members were available in the home to assist as needed. Denied current need for transportation or outreach from [place]. PCP follow-up pending. Marilyn Solomon was  agreeable to [facility] services and home visit within the next few weeks. RNCM contact information provided. Member denied urgent concerns and agreed to contact RNCM if needed prior to next outreach.  THN CM Care Plan Problem One     Most Recent Value  Care Plan Problem One  Risk for Readmission  Role Documenting the Problem One  Care Management [place] for Problem One  Active  THN Long Term Goal   Patient will not be rehospitalized over  the next 60 days.  THN Long Term Goal Start Date  07/01/18  Interventions for Problem One Long Term Goal  Discussed medications, nutrition and compliance with MD recommendations. Educated patient regarding COPD zones and worsening s/sx that require immediate medical attention.  THN CM Short Term Goal #1   Patient will take all medications as prescribed over the next 30 days.  THN CM Short Term Goal #1 Start Date  07/01/18  Interventions for Short Term Goal #1  Discussed medications and indications for use. Discussed ability to manage and afford medications.  THN CM Short Term Goal #2   Patient will attend recommended MD follow ups within the next 30 days.  THN CM Short Term Goal #2 Start Date  07/01/18  Interventions for Short Term Goal #2  Reviewed pending appointments and transportation needs.  THN CM Short Term Goal #3  Over the next 30 days patient will wear oxygen everyday at prescribed settings.  THN CM Short Term Goal #3 Start Date  07/01/18  Interventions for Short Tern Goal #3  Discussed compliance with home O2 use and O2 saturation readings.       PLAN Will follow up next week.   [place] [phone]

## 2018-07-11 ENCOUNTER — Other Ambulatory Visit: Payer: Self-pay

## 2018-07-11 NOTE — Patient Outreach (Signed)
[facility]) Care Management  07/11/2018  Marilyn Solomon 1945-12-29 [id number]  Brief outreach with Ms. Marilyn Solomon. Reported preparing for errands at the time of the call. No complaints of shortness of breath or worsening symptoms. Reported continued compliance with medications and using home O2 at 2L/min. No decline in activity tolerance. Denied urgent questions or concerns. Will contact RNCM if needed prior to next scheduled outreach.  PLAN Will continue routine outreach. [place] [phone]

## 2018-07-18 ENCOUNTER — Other Ambulatory Visit: Payer: Self-pay

## 2018-07-18 NOTE — Patient Outreach (Signed)
[place] [facility]) Care Management  07/18/2018  Marilyn Solomon December 08, 1945 [id number]   Unsuccessful outreach attempt. Phone line busy.    PLAN Will attempt outreach within 3-4 business days.   [place] [phone]

## 2018-07-24 ENCOUNTER — Ambulatory Visit: Payer: Self-pay

## 2018-07-24 ENCOUNTER — Other Ambulatory Visit: Payer: Self-pay

## 2018-07-24 DIAGNOSIS — J441 Chronic obstructive pulmonary disease with (acute) exacerbation: Secondary | ICD-10-CM | POA: Diagnosis not present

## 2018-07-24 NOTE — Patient Outreach (Signed)
[place] [facility]) Care Management  07/24/2018  SOLASH TULLO 1946-03-26 [id number]   Successful outreach with Ms. Olivero. Reports feeling well today. Denies complaints of shortness of breath or decreased activity tolerance. Reports compliance with medications and treatment recommendations. Reports maintaining oxygen saturations above 90%.  Denies urgent concerns or significant changes since last outreach. Agreeable to telephonic follow up next month.  PLAN Will follow up next month.   [place] [phone]

## 2018-07-27 DIAGNOSIS — J441 Chronic obstructive pulmonary disease with (acute) exacerbation: Secondary | ICD-10-CM | POA: Diagnosis not present

## 2018-08-06 DIAGNOSIS — I10 Essential (primary) hypertension: Secondary | ICD-10-CM | POA: Diagnosis not present

## 2018-08-06 DIAGNOSIS — E1169 Type 2 diabetes mellitus with other specified complication: Secondary | ICD-10-CM | POA: Diagnosis not present

## 2018-08-06 DIAGNOSIS — E662 Morbid (severe) obesity with alveolar hypoventilation: Secondary | ICD-10-CM | POA: Diagnosis not present

## 2018-08-06 DIAGNOSIS — I890 Lymphedema, not elsewhere classified: Secondary | ICD-10-CM | POA: Diagnosis not present

## 2018-08-12 ENCOUNTER — Other Ambulatory Visit (HOSPITAL_COMMUNITY): Payer: Self-pay | Admitting: Family Medicine

## 2018-08-12 ENCOUNTER — Ambulatory Visit (HOSPITAL_COMMUNITY)
Admission: RE | Admit: 2018-08-12 | Discharge: 2018-08-12 | Disposition: A | Discharge status: Home / Self Care | Payer: Medicare HMO | Source: Ambulatory Visit | Attending: Family Medicine | Admitting: Family Medicine

## 2018-08-12 ENCOUNTER — Other Ambulatory Visit: Payer: Self-pay

## 2018-08-12 DIAGNOSIS — R0602 Shortness of breath: Secondary | ICD-10-CM | POA: Diagnosis not present

## 2018-08-12 DIAGNOSIS — J441 Chronic obstructive pulmonary disease with (acute) exacerbation: Secondary | ICD-10-CM | POA: Diagnosis not present

## 2018-08-12 DIAGNOSIS — R05 Cough: Secondary | ICD-10-CM | POA: Diagnosis not present

## 2018-08-26 DIAGNOSIS — J441 Chronic obstructive pulmonary disease with (acute) exacerbation: Secondary | ICD-10-CM | POA: Diagnosis not present

## 2018-08-27 ENCOUNTER — Other Ambulatory Visit: Payer: Self-pay

## 2018-08-27 NOTE — Patient Outreach (Signed)
[place] [facility]) Care Management  08/27/2018  Marilyn Solomon 10-21-1945 [id number]    Successful outreach with Marilyn Solomon. Compliant with medications and recommendation for continuous O2. Reports shortness of breath with prolonged activity but states episodes are quickly resolved. No complaints of shortness of breath at rest. Reports ambulating without difficulty. No significant changes in activity tolerance. Performs ADLs independently. Overall reports doing very well. Discussed care management needs. Marilyn Solomon is progressing well and following recommendations related to COVID-19 respiratory restrictions. No concerns regarding nutrition, transportation or medication affordability. Reports family members are available to assist as needed. Expressed interest in attending Pulmonary rehabilitation within the next few months. Will review options and discuss after respiratory restrictions are lifted.  PLAN Will continue routine follow-up.   [place] [phone]

## 2018-09-11 DIAGNOSIS — J449 Chronic obstructive pulmonary disease, unspecified: Secondary | ICD-10-CM | POA: Diagnosis not present

## 2018-09-17 ENCOUNTER — Other Ambulatory Visit: Payer: Self-pay

## 2018-09-17 NOTE — Patient Outreach (Signed)
[place] [facility]) Care Management  09/17/2018  Marilyn Solomon 02-Mar-1946 [id number]   Successful outreach with Ms. Marilyn Solomon. Reports doing well since last conversation. She remains compliant with medications and treatment recommendations. She completed her scheduled visit with Dr. Criss Rosales on 09/11/18 and will follow up 10/06/18.  Ms. Marilyn Solomon denies changes in care management needs. She has progressed well and met her care management goals. Pending follow-up regarding options for Pulmonary rehabilitation. Agreeable to outreach after scheduled follow-up with Dr. Criss Rosales.  PLAN Will follow up next month. Will attempt to establish services with pulmonary rehab prior to case closure.   [place] [phone]

## 2018-09-25 ENCOUNTER — Encounter (HOSPITAL_COMMUNITY): Payer: Self-pay | Admitting: Emergency Medicine

## 2018-09-25 ENCOUNTER — Other Ambulatory Visit: Payer: Self-pay

## 2018-09-25 ENCOUNTER — Emergency Department (HOSPITAL_COMMUNITY)
Admission: EM | Admit: 2018-09-25 | Discharge: 2018-09-25 | Disposition: A | Discharge status: Home / Self Care | Payer: Medicare HMO | Attending: Emergency Medicine | Admitting: Emergency Medicine

## 2018-09-25 ENCOUNTER — Emergency Department (HOSPITAL_COMMUNITY): Payer: Medicare HMO

## 2018-09-25 DIAGNOSIS — Z87891 Personal history of nicotine dependence: Secondary | ICD-10-CM | POA: Insufficient documentation

## 2018-09-25 DIAGNOSIS — R0602 Shortness of breath: Secondary | ICD-10-CM | POA: Diagnosis not present

## 2018-09-25 DIAGNOSIS — I10 Essential (primary) hypertension: Secondary | ICD-10-CM | POA: Insufficient documentation

## 2018-09-25 DIAGNOSIS — Z9981 Dependence on supplemental oxygen: Secondary | ICD-10-CM | POA: Diagnosis not present

## 2018-09-25 DIAGNOSIS — J441 Chronic obstructive pulmonary disease with (acute) exacerbation: Secondary | ICD-10-CM | POA: Insufficient documentation

## 2018-09-25 DIAGNOSIS — R0600 Dyspnea, unspecified: Secondary | ICD-10-CM | POA: Diagnosis present

## 2018-09-25 DIAGNOSIS — R05 Cough: Secondary | ICD-10-CM | POA: Diagnosis not present

## 2018-09-25 DIAGNOSIS — J449 Chronic obstructive pulmonary disease, unspecified: Secondary | ICD-10-CM | POA: Diagnosis not present

## 2018-09-25 DIAGNOSIS — Z79899 Other long term (current) drug therapy: Secondary | ICD-10-CM | POA: Diagnosis not present

## 2018-09-25 LAB — BASIC METABOLIC PANEL
Anion gap: 13 (ref 5–15)
BUN: 14 mg/dL (ref 8–23)
CO2: 34 mmol/L — ABNORMAL HIGH (ref 22–32)
Calcium: 9.8 mg/dL (ref 8.9–10.3)
Chloride: 94 mmol/L — ABNORMAL LOW (ref 98–111)
Creatinine, Ser: 0.83 mg/dL (ref 0.44–1.00)
GFR calc Af Amer: >60 mL/min (ref >60)
GFR calc non Af Amer: >60 mL/min (ref >60)
Glucose, Bld: 127 mg/dL — ABNORMAL HIGH (ref 70–99)
Potassium: 3.3 mmol/L — ABNORMAL LOW (ref 3.5–5.1)
Sodium: 141 mmol/L (ref 135–145)

## 2018-09-25 LAB — CBC
HCT: 38.9 % (ref 36.0–46.0)
Hemoglobin: 11.7 g/dL — ABNORMAL LOW (ref 12.0–15.0)
MCH: 26.7 pg (ref 26.0–34.0)
MCHC: 30.1 g/dL (ref 30.0–36.0)
MCV: 88.8 fL (ref 80.0–100.0)
Platelets: 301 10*3/uL (ref 150–400)
RBC: 4.38 MIL/uL (ref 3.87–5.11)
RDW: 15.7 % — ABNORMAL HIGH (ref 11.5–15.5)
WBC: 11.8 10*3/uL — ABNORMAL HIGH (ref 4.0–10.5)
nRBC: 0 % (ref 0.0–0.2)

## 2018-09-25 MED ORDER — PREDNISONE 10 MG PO TABS: 40.0000 mg | ORAL_TABLET | Freq: Every day | ORAL | 0 refills | Status: DC

## 2018-09-25 MED ORDER — ALBUTEROL SULFATE HFA 108 (90 BASE) MCG/ACT IN AERS
2.0000 | INHALATION_SPRAY | Freq: Once | RESPIRATORY_TRACT | Status: AC
  Administered 2018-09-25: 2 via RESPIRATORY_TRACT
  Filled 2018-09-25: qty 6.7

## 2018-09-25 MED ORDER — METHYLPREDNISOLONE SODIUM SUCC 125 MG IJ SOLR
125.0000 mg | Freq: Once | INTRAMUSCULAR | Status: AC
  Administered 2018-09-25: 125 mg via INTRAVENOUS
  Filled 2018-09-25: qty 2

## 2018-09-25 MED ORDER — LEVOFLOXACIN 750 MG PO TABS: 750.0000 mg | ORAL_TABLET | Freq: Every day | ORAL | 0 refills | Status: AC

## 2018-09-25 NOTE — ED Triage Notes (Signed)
Pt c/o of sob and congested cough x 3 days. No fever

## 2018-09-25 NOTE — ED Provider Notes (Signed)
[facility] EMERGENCY DEPARTMENT Provider Note   CSN: [id number] Arrival date & time: 09/25/18  1615    History   Chief Complaint Chief Complaint  Patient presents with   Shortness of Breath    HPI Marilyn Solomon is a 73 y.o. female.     Patient with known history of COPD.  Last had admission in January for COPD exacerbation along with probable pneumonia.  Patient was discharged on Levaquin and prednisone to take at home.  At that point in time she was having difficulty with her home oxygen arrangement.  That has been resolved.  Patient is using 2 L of oxygen at all times.  Oxygen saturation on 2 L is 96%.  Patient uses albuterol nebulizers at home.  Currently not on steroids.  Patient is followed by [facility].  And also followed by Dr. Criss Rosales.  Patient states that she has had increased cough with phlegm.  Has had increased shortness of breath and feels as if she is wheezing.  Patient denies fever.     Past Medical History:  Diagnosis Date   Anemia    Anxiety    Arthritis    Chronic back pain    COPD (chronic obstructive pulmonary disease) (HCC)    GERD (gastroesophageal reflux disease)    Hypertension    Hypothyroidism    Reflux    no medications currently, asymptomatic   Thyroid disease     Patient Active Problem List   Diagnosis Date Noted   Acute on chronic respiratory failure with hypoxemia ([place]) 06/21/2018   COPD exacerbation ([place]) 06/02/2018   Anemia 06/02/2018   Hypertension 06/02/2018   COPD with acute exacerbation ([place]) 01/31/2018   Tachycardia 01/31/2018   Hypoxia 01/31/2018   Leukocytosis 01/31/2018   Positive colorectal cancer screening using Cologuard test 08/21/2017   Normocytic anemia    Anxiety 06/30/2017   Hypothyroidism 06/30/2017   GERD (gastroesophageal reflux disease) 06/30/2017   COPD (chronic obstructive pulmonary disease) ([place]) 06/30/2017   Elevated liver enzymes 06/30/2017   Severe Iron deficiency anemia  06/30/2017   HCAP (healthcare-associated pneumonia) 06/28/2017   CAP (community acquired pneumonia) 06/28/2017   Second degree hemorrhoids    Encounter for screening colonoscopy 10/28/2015    Past Surgical History:  Procedure Laterality Date   ABDOMINAL HYSTERECTOMY     BIOPSY  10/08/2017   Procedure: BIOPSY;  Surgeon: Daneil Dolin, MD;  Location: [facility];  Service: Endoscopy;;  gastric   cataracts Bilateral    COLONOSCOPY  2007   Dr. Gala Romney: normal rectum, diverticula    COLONOSCOPY WITH PROPOFOL N/A 11/15/2015   Dr. Gala Romney: grade II hemorrhoids, diverticulosis   COLONOSCOPY WITH PROPOFOL N/A 10/08/2017   Procedure: COLONOSCOPY WITH PROPOFOL;  Surgeon: Daneil Dolin, MD;  Location: [facility] ENDO SU[facility];  Service: Endoscopy;  Laterality: N/A;  11:15am   ESOPHAGOGASTRODUODENOSCOPY (EGD) WITH PROPOFOL N/A 10/08/2017   Procedure: ESOPHAGOGASTRODUODENOSCOPY (EGD) WITH PROPOFOL;  Surgeon: Daneil Dolin, MD;  Location: [facility];  Service: Endoscopy;  Laterality: N/A;   FRACTURE SURGERY Right 2005   Wrist   JOINT REPLACEMENT Left 2000   hip   TOTAL HIP ARTHROPLASTY Left    WRIST SURGERY Right      OB History   No obstetric history on file.      Home Medications    Prior to Admission medications   Medication Sig Start Date End Date Taking? Authorizing Provider  ALPRAZolam Duanne Moron) 1 MG tablet Take 1 mg by mouth every 8 (  eight) hours as needed for anxiety.  08/10/14   [provider]  benzonatate (TESSALON) 200 MG capsule Take 1 capsule (200 mg total) by mouth 3 (three) times daily as needed (Excessive coughing spells). 06/23/18   Barton Dubois, MD  feeding supplement, ENSURE ENLIVE, (ENSURE ENLIVE) LIQD Take 237 mLs by mouth 2 (two) times daily between meals. Patient not taking: Reported on 09/17/2018 06/24/18   Barton Dubois, MD  ferrous sulfate 325 (65 FE) MG tablet Take 1 tablet (325 mg total) by mouth daily. Patient not taking: Reported on 09/17/2018  06/04/18 06/04/19  Heath Lark D, DO  Fluticasone-Salmeterol (ADVAIR DISKUS) 250-50 MCG/DOSE AEPB Inhale 1 puff into the lungs 2 (two) times daily. 06/23/18 06/23/19  Barton Dubois, MD  ipratropium-albuterol (DUONEB) 0.5-2.5 (3) MG/3ML SOLN Take 3 mLs by nebulization every 6 (six) hours as needed (shortness of breath or wheezing). 06/04/18 07/04/18  Manuella Ghazi, Pratik D, DO  levofloxacin (LEVAQUIN) 750 MG tablet Take 1 tablet (750 mg total) by mouth daily for 5 days. 09/25/18 09/30/18  Fredia Sorrow, MD  levothyroxine (SYNTHROID, LEVOTHROID) 75 MCG tablet Take 75 mcg by mouth daily.      [provider]  Multiple Vitamin (MULTIVITAMIN WITH MINERALS) TABS tablet Take 1 tablet by mouth daily after supper.    [provider]  oxyCODONE (ROXICODONE) 15 MG immediate release tablet Take 15 mg by mouth 3 (three) times daily as needed for pain.     [provider]  pantoprazole (PROTONIX) 40 MG tablet TAKE 1 TABLET BY MOUTH DAILY. Patient taking differently: Take 40 mg by mouth daily.  05/27/18   Annitta Needs, NP  predniSONE (DELTASONE) 10 MG tablet Take 4 tablets (40 mg total) by mouth daily. 09/25/18   Fredia Sorrow, MD  predniSONE (DELTASONE) 20 MG tablet Take 3 tablets by mouth daily x1 day; then 2 tablets by mouth daily x2 days; then 1 tablet by mouth daily x3 days; then half tablet by mouth daily x3 days and stop prednisone. Patient not taking: Reported on 09/17/2018 06/23/18   Barton Dubois, MD  Vitamin D, Ergocalciferol, (DRISDOL) 50000 UNITS CAPS capsule Take 50,000 Units by mouth every Friday.  07/31/14   [provider]    Family History Family History  Problem Relation Age of Onset   Bone cancer Sister    Pancreatic cancer Sister    Pneumonia Mother    Heart attack Father    Diabetes Sister    Dementia Sister    Colon cancer Neg Hx     Social History Social History   Tobacco Use   Smoking status: Former Smoker    Packs/day: 25.00    Years: 1.00     Pack years: 25.00    Types: Cigarettes    Last attempt to quit: 08/27/1998    Years since quitting: 20.0   Smokeless tobacco: Never Used   Tobacco comment: quit in 2000  Substance Use Topics   Alcohol use: No    Alcohol/week: 0.0 standard drinks   Drug use: No     Allergies   Penicillins and Vicodin [hydrocodone-acetaminophen]   Review of Systems Review of Systems  Constitutional: Negative for chills and fever.  HENT: Negative for congestion, rhinorrhea and sore throat.   Eyes: Negative for visual disturbance.  Respiratory: Positive for cough, shortness of breath and wheezing.   Cardiovascular: Negative for chest pain and leg swelling.  Gastrointestinal: Negative for abdominal pain, diarrhea, nausea and vomiting.  Genitourinary: Negative for dysuria.  Musculoskeletal: Negative for  back pain and neck pain.  Skin: Negative for rash.  Neurological: Negative for dizziness, light-headedness and headaches.  Hematological: Does not bruise/bleed easily.  Psychiatric/Behavioral: Negative for confusion.     Physical Exam Updated Vital Signs BP 123/69    Pulse (!) 109    Temp 98.1 F (36.7 C) (Oral)    Resp 20    Ht 1.651 m (5\' 5" )    Wt 49.4 kg    SpO2 94%    BMI 18.14 kg/m   Physical Exam Vitals signs and nursing note reviewed.  Constitutional:      General: She is not in acute distress.    Appearance: Normal appearance. She is well-developed.  HENT:     Head: Normocephalic and atraumatic.  Eyes:     Extraocular Movements: Extraocular movements intact.     Conjunctiva/sclera: Conjunctivae normal.     Pupils: Pupils are equal, round, and reactive to light.  Neck:     Musculoskeletal: Neck supple.  Cardiovascular:     Rate and Rhythm: Regular rhythm. Tachycardia present.     Heart sounds: No murmur.  Pulmonary:     Effort: Pulmonary effort is normal. No respiratory distress.     Breath sounds: Wheezing present.  Abdominal:     General: Bowel sounds are normal.      Palpations: Abdomen is soft.     Tenderness: There is no abdominal tenderness.  Musculoskeletal: Normal range of motion.        General: No swelling.  Skin:    General: Skin is warm and dry.  Neurological:     General: No focal deficit present.     Mental Status: She is alert and oriented to person, place, and time.      ED Treatments / Results  Labs (all labs ordered are listed, but only abnormal results are displayed) Labs Reviewed  CBC - Abnormal; Notable for the following components:      Result Value   WBC 11.8 (*)    Hemoglobin 11.7 (*)    RDW 15.7 (*)    All other components within normal limits  BASIC METABOLIC PANEL - Abnormal; Notable for the following components:   Potassium 3.3 (*)    Chloride 94 (*)    CO2 34 (*)    Glucose, Bld 127 (*)    All other components within normal limits    EKG EKG Interpretation  Date/Time:  Wednesday September 25 2018 16:31:20 EDT Ventricular Rate:  113 PR Interval:    QRS Duration: 92 QT Interval:  327 QTC Calculation: 449 R Axis:   76 Text Interpretation:  Sinus tachycardia Right atrial enlargement Borderline repolarization abnormality No significant change since last tracing Confirmed by Fredia Sorrow [phone]) on 09/25/2018 5:23:53 PM   Radiology Dg Chest Port 1 View  Result Date: 09/25/2018 CLINICAL DATA:  Cough. Shortness of breath. Chest congestion for 2 days. Ex-smoker. COPD. Hypertension. EXAM: PORTABLE CHEST 1 VIEW COMPARISON:  08/12/2018 FINDINGS: Patient rotated minimally right. Normal heart size. No pleural effusion or pneumothorax. Hyperinflation and interstitial thickening. Patchy, left greater than right basilar predominant pulmonary opacities. At least 1 lower left remote rib fracture. IMPRESSION: Hyperinflation and significant interstitial thickening. Apparent left greater than right base patchy pulmonary opacities are felt to be similar over prior exams and favored to represent foci of scarring and confluent  interstitial thickening. If PA and lateral radiographs are possible, these should be considered. Electronically Signed   By: Abigail Miyamoto M.D.   On: 09/25/2018 16:57  Procedures Procedures (including critical care time)  Medications Ordered in ED Medications  albuterol (VENTOLIN HFA) 108 (90 Base) MCG/ACT inhaler 2 puff (has no administration in time range)  methylPREDNISolone sodium succinate (SOLU-MEDROL) 125 mg/2 mL injection 125 mg (has no administration in time range)     Initial Impression / Assessment and Plan / ED Course  I have reviewed the triage vital signs and the nursing notes.  Pertinent labs & imaging results that were available during my care of the patient were reviewed by me and considered in my medical decision making (see chart for details).       Patient's chest x-ray negative for pneumonia.  Labs without significant abnormality.  Oxygen saturations remained very good on her 2 L.  Patient received albuterol inhaler treatment with resolution of the wheezing.  And also received 125 mg of Solu-Medrol.  Patient had a mild leukocytosis with a white blood cell count of 11.8.  Patient is feeling much better.  She will be discharged home on a 5-day course of prednisone.  Patient also requested Levaquin she said that when she has this occur that usually helps her.  So she given a 5-day course of that for sort of a exacerbation of the bronchitis component.  Patient will follow-up with her doctor should return for any new or worse symptoms.  No concerns based on her presentation for COVID-19 infection at this time.   Final Clinical Impressions(s) / ED Diagnoses   Final diagnoses:  COPD exacerbation [facility])    ED Discharge Orders         Ordered    levofloxacin (LEVAQUIN) 750 MG tablet  Daily     09/25/18 1757    predniSONE (DELTASONE) 10 MG tablet  Daily     09/25/18 1757           Fredia Sorrow, MD 09/25/18 1815

## 2018-09-25 NOTE — Discharge Instructions (Addendum)
Take the antibiotic as directed for the next 5 days.  Take the prednisone as directed for the next 5 days.  Make an appointment to follow-up with your doctors.  Return for any new or worse symptoms.  Chest x-ray today negative.  Continue to use your oxygen as directed.  Return for any new or worse symptoms.

## 2018-09-26 DIAGNOSIS — J441 Chronic obstructive pulmonary disease with (acute) exacerbation: Secondary | ICD-10-CM | POA: Diagnosis not present

## 2018-10-09 DIAGNOSIS — I10 Essential (primary) hypertension: Secondary | ICD-10-CM | POA: Diagnosis not present

## 2018-10-09 DIAGNOSIS — Z681 Body mass index (BMI) 19 or less, adult: Secondary | ICD-10-CM | POA: Diagnosis not present

## 2018-10-09 DIAGNOSIS — E1169 Type 2 diabetes mellitus with other specified complication: Secondary | ICD-10-CM | POA: Diagnosis not present

## 2018-10-09 DIAGNOSIS — E038 Other specified hypothyroidism: Secondary | ICD-10-CM | POA: Diagnosis not present

## 2018-10-09 DIAGNOSIS — E876 Hypokalemia: Secondary | ICD-10-CM | POA: Diagnosis not present

## 2018-10-09 DIAGNOSIS — D649 Anemia, unspecified: Secondary | ICD-10-CM | POA: Diagnosis not present

## 2018-10-14 ENCOUNTER — Other Ambulatory Visit: Payer: Self-pay

## 2018-10-14 NOTE — Patient Outreach (Signed)
[facility]) Care Management  10/14/2018  HEER JUSTISS 1945/11/02 [id number]   Successful outreach with Marilyn Solomon. She reports feeling well and running several errands earlier today. Reports compliance with recommendations related to COVID-19. Reports a productive cough with what she describes as "white, stringy" sputum. Denies shortness of breath at rest but continues to experience episodes with prolonged activity. She denies changes in activity tolerance within the last week. Continues to complete ADLs independently.  Marilyn Solomon remains compliant with medications and treatment recommendations. She denies immediate care management needs but remains interested in Pulmonary Rehab. She understands that services are currently limited due to COVID-19 restrictions. Agreeable to [facility] assisting once restrictions are lifted.   PLAN Will continue routine outreach.   [place] [phone]

## 2018-10-26 DIAGNOSIS — J441 Chronic obstructive pulmonary disease with (acute) exacerbation: Secondary | ICD-10-CM | POA: Diagnosis not present

## 2018-11-04 ENCOUNTER — Other Ambulatory Visit: Payer: Self-pay

## 2018-11-04 NOTE — Patient Outreach (Signed)
[facility]) Care Management  11/04/2018  Marilyn Solomon 26-Jun-1945 [id number]   Successful outreach with Ms. Harries. She recently completed a scheduled provider follow-up. No changes were made to her medications or treatment plan. She is being monitored closely for s/sx of respiratory infection. She verbalized knowledge of worsening s/sx that require immediate medical attention. She remains compliant with medications and treatment recommendations. Reports doing well and denies changes in care management needs.  PLAN -Will reevaluate next month. -Anticipate transition to [facility].   [place] [phone]

## 2018-11-06 ENCOUNTER — Other Ambulatory Visit: Payer: Self-pay

## 2018-11-06 ENCOUNTER — Encounter (HOSPITAL_COMMUNITY): Payer: Self-pay | Admitting: Emergency Medicine

## 2018-11-06 ENCOUNTER — Observation Stay (HOSPITAL_COMMUNITY)
Admission: EM | Admit: 2018-11-06 | Discharge: 2018-11-08 | Disposition: A | Discharge status: Home / Self Care | Payer: Medicare HMO | Attending: Family Medicine | Admitting: Family Medicine

## 2018-11-06 ENCOUNTER — Emergency Department (HOSPITAL_COMMUNITY): Payer: Medicare HMO

## 2018-11-06 DIAGNOSIS — J441 Chronic obstructive pulmonary disease with (acute) exacerbation: Secondary | ICD-10-CM | POA: Diagnosis not present

## 2018-11-06 DIAGNOSIS — R739 Hyperglycemia, unspecified: Secondary | ICD-10-CM | POA: Insufficient documentation

## 2018-11-06 DIAGNOSIS — Z79890 Hormone replacement therapy (postmenopausal): Secondary | ICD-10-CM | POA: Diagnosis not present

## 2018-11-06 DIAGNOSIS — E876 Hypokalemia: Secondary | ICD-10-CM | POA: Diagnosis not present

## 2018-11-06 DIAGNOSIS — K219 Gastro-esophageal reflux disease without esophagitis: Secondary | ICD-10-CM | POA: Diagnosis not present

## 2018-11-06 DIAGNOSIS — Z87891 Personal history of nicotine dependence: Secondary | ICD-10-CM | POA: Insufficient documentation

## 2018-11-06 DIAGNOSIS — R2689 Other abnormalities of gait and mobility: Secondary | ICD-10-CM | POA: Insufficient documentation

## 2018-11-06 DIAGNOSIS — Z885 Allergy status to narcotic agent status: Secondary | ICD-10-CM | POA: Insufficient documentation

## 2018-11-06 DIAGNOSIS — Z96642 Presence of left artificial hip joint: Secondary | ICD-10-CM | POA: Insufficient documentation

## 2018-11-06 DIAGNOSIS — R0902 Hypoxemia: Secondary | ICD-10-CM | POA: Diagnosis not present

## 2018-11-06 DIAGNOSIS — D72829 Elevated white blood cell count, unspecified: Secondary | ICD-10-CM | POA: Diagnosis not present

## 2018-11-06 DIAGNOSIS — R05 Cough: Secondary | ICD-10-CM | POA: Diagnosis not present

## 2018-11-06 DIAGNOSIS — M549 Dorsalgia, unspecified: Secondary | ICD-10-CM | POA: Diagnosis not present

## 2018-11-06 DIAGNOSIS — I10 Essential (primary) hypertension: Secondary | ICD-10-CM | POA: Insufficient documentation

## 2018-11-06 DIAGNOSIS — D649 Anemia, unspecified: Secondary | ICD-10-CM | POA: Diagnosis present

## 2018-11-06 DIAGNOSIS — G8929 Other chronic pain: Secondary | ICD-10-CM | POA: Diagnosis not present

## 2018-11-06 DIAGNOSIS — R2681 Unsteadiness on feet: Secondary | ICD-10-CM | POA: Diagnosis not present

## 2018-11-06 DIAGNOSIS — R0602 Shortness of breath: Secondary | ICD-10-CM | POA: Diagnosis not present

## 2018-11-06 DIAGNOSIS — M6281 Muscle weakness (generalized): Secondary | ICD-10-CM | POA: Diagnosis not present

## 2018-11-06 DIAGNOSIS — D509 Iron deficiency anemia, unspecified: Secondary | ICD-10-CM | POA: Insufficient documentation

## 2018-11-06 DIAGNOSIS — Z880 Allergy status to penicillin: Secondary | ICD-10-CM | POA: Insufficient documentation

## 2018-11-06 DIAGNOSIS — Z79899 Other long term (current) drug therapy: Secondary | ICD-10-CM | POA: Insufficient documentation

## 2018-11-06 DIAGNOSIS — F419 Anxiety disorder, unspecified: Secondary | ICD-10-CM | POA: Diagnosis not present

## 2018-11-06 DIAGNOSIS — Z8249 Family history of ischemic heart disease and other diseases of the circulatory system: Secondary | ICD-10-CM | POA: Insufficient documentation

## 2018-11-06 DIAGNOSIS — E039 Hypothyroidism, unspecified: Secondary | ICD-10-CM | POA: Diagnosis not present

## 2018-11-06 DIAGNOSIS — Z7951 Long term (current) use of inhaled steroids: Secondary | ICD-10-CM | POA: Insufficient documentation

## 2018-11-06 DIAGNOSIS — Z20828 Contact with and (suspected) exposure to other viral communicable diseases: Secondary | ICD-10-CM | POA: Diagnosis not present

## 2018-11-06 DIAGNOSIS — Z1159 Encounter for screening for other viral diseases: Secondary | ICD-10-CM | POA: Insufficient documentation

## 2018-11-06 DIAGNOSIS — Z7952 Long term (current) use of systemic steroids: Secondary | ICD-10-CM | POA: Insufficient documentation

## 2018-11-06 MED ORDER — MAGNESIUM SULFATE 2 GM/50ML IV SOLN
2.0000 g | Freq: Once | INTRAVENOUS | Status: AC
  Administered 2018-11-06: 2 g via INTRAVENOUS
  Filled 2018-11-06: qty 50

## 2018-11-06 MED ORDER — ONDANSETRON HCL 4 MG/2ML IJ SOLN
4.0000 mg | Freq: Once | INTRAMUSCULAR | Status: AC
  Administered 2018-11-06: 4 mg via INTRAVENOUS
  Filled 2018-11-06: qty 2

## 2018-11-06 MED ORDER — ALBUTEROL SULFATE HFA 108 (90 BASE) MCG/ACT IN AERS
8.0000 | INHALATION_SPRAY | Freq: Once | RESPIRATORY_TRACT | Status: AC
  Administered 2018-11-06: 8 via RESPIRATORY_TRACT
  Filled 2018-11-06: qty 6.7

## 2018-11-06 NOTE — ED Notes (Signed)
Pt is vomiting and edp notified.

## 2018-11-06 NOTE — ED Triage Notes (Signed)
Per pt she developed sob tonight. Pt states she took 5 breathing tx at home with no relief. Pt is on continuous o2 at home 2lpm. Pt was 78% on 2lpm when ems arrived. Ems gave pt 125mg  solumedrol and placed her on nrb.

## 2018-11-06 NOTE — ED Provider Notes (Signed)
[facility] EMERGENCY DEPARTMENT Provider Note   CSN: [id number] Arrival date & time: 11/06/18  2306    History   Chief Complaint Chief Complaint  Patient presents with   Shortness of Breath    HPI Marilyn Solomon is a 73 y.o. female.     HPI  This is a 73 year old female with a history of COPD, hypertension, thyroid disease who presents by EMS with shortness of breath.  Reports progressively worsening shortness of breath at home.  Took multiple nebulizers at home with minimal relief.  Was found by EMS to be in the mid 6s on pulse ox.  At baseline she is on 2 L of oxygen.  She was placed on a nonrebreather and repeat pulse ox mid 90s.  She was given Solu-Medrol in route.  She denies any recent fevers or cough.  She denies any chest pain or lower extremity edema.  She denies any known sick contacts.  Level 5 caveat for acuity of condition.  Past Medical History:  Diagnosis Date   Anemia    Anxiety    Arthritis    Chronic back pain    COPD (chronic obstructive pulmonary disease) (HCC)    GERD (gastroesophageal reflux disease)    Hypertension    Hypothyroidism    Reflux    no medications currently, asymptomatic   Thyroid disease     Patient Active Problem List   Diagnosis Date Noted   Acute on chronic respiratory failure with hypoxemia ([place]) 06/21/2018   COPD exacerbation ([place]) 06/02/2018   Anemia 06/02/2018   Hypertension 06/02/2018   COPD with acute exacerbation ([place]) 01/31/2018   Tachycardia 01/31/2018   Hypoxia 01/31/2018   Leukocytosis 01/31/2018   Positive colorectal cancer screening using Cologuard test 08/21/2017   Normocytic anemia    Anxiety 06/30/2017   Hypothyroidism 06/30/2017   GERD (gastroesophageal reflux disease) 06/30/2017   COPD (chronic obstructive pulmonary disease) ([place]) 06/30/2017   Elevated liver enzymes 06/30/2017   Severe Iron deficiency anemia 06/30/2017   HCAP (healthcare-associated pneumonia)  06/28/2017   CAP (community acquired pneumonia) 06/28/2017   Second degree hemorrhoids    Encounter for screening colonoscopy 10/28/2015    Past Surgical History:  Procedure Laterality Date   ABDOMINAL HYSTERECTOMY     BIOPSY  10/08/2017   Procedure: BIOPSY;  Surgeon: Daneil Dolin, MD;  Location: [facility];  Service: Endoscopy;;  gastric   cataracts Bilateral    COLONOSCOPY  2007   Dr. Gala Romney: normal rectum, diverticula    COLONOSCOPY WITH PROPOFOL N/A 11/15/2015   Dr. Gala Romney: grade II hemorrhoids, diverticulosis   COLONOSCOPY WITH PROPOFOL N/A 10/08/2017   Procedure: COLONOSCOPY WITH PROPOFOL;  Surgeon: Daneil Dolin, MD;  Location: [facility];  Service: Endoscopy;  Laterality: N/A;  11:15am   ESOPHAGOGASTRODUODENOSCOPY (EGD) WITH PROPOFOL N/A 10/08/2017   Procedure: ESOPHAGOGASTRODUODENOSCOPY (EGD) WITH PROPOFOL;  Surgeon: Daneil Dolin, MD;  Location: [facility];  Service: Endoscopy;  Laterality: N/A;   FRACTURE SURGERY Right 2005   Wrist   JOINT REPLACEMENT Left 2000   hip   TOTAL HIP ARTHROPLASTY Left    WRIST SURGERY Right      OB History   No obstetric history on file.      Home Medications    Prior to Admission medications   Medication Sig Start Date End Date Taking? Authorizing Provider  ALPRAZolam Duanne Moron) 1 MG tablet Take 1 mg by mouth every 8 (eight) hours as needed for anxiety.  08/10/14  [provider]  benzonatate (TESSALON) 200 MG capsule Take 1 capsule (200 mg total) by mouth 3 (three) times daily as needed (Excessive coughing spells). 06/23/18   Barton Dubois, MD  feeding supplement, ENSURE ENLIVE, (ENSURE ENLIVE) LIQD Take 237 mLs by mouth 2 (two) times daily between meals. Patient not taking: Reported on 09/17/2018 06/24/18   Barton Dubois, MD  ferrous sulfate 325 (65 FE) MG tablet Take 1 tablet (325 mg total) by mouth daily. Patient not taking: Reported on 09/17/2018 06/04/18 06/04/19  Heath Lark D, DO    Fluticasone-Salmeterol (ADVAIR DISKUS) 250-50 MCG/DOSE AEPB Inhale 1 puff into the lungs 2 (two) times daily. 06/23/18 06/23/19  Barton Dubois, MD  ipratropium-albuterol (DUONEB) 0.5-2.5 (3) MG/3ML SOLN Take 3 mLs by nebulization every 6 (six) hours as needed (shortness of breath or wheezing). 06/04/18 07/04/18  Manuella Ghazi, Pratik D, DO  levothyroxine (SYNTHROID, LEVOTHROID) 75 MCG tablet Take 75 mcg by mouth daily.      [provider]  Multiple Vitamin (MULTIVITAMIN WITH MINERALS) TABS tablet Take 1 tablet by mouth daily after supper.    [provider]  oxyCODONE (ROXICODONE) 15 MG immediate release tablet Take 15 mg by mouth 3 (three) times daily as needed for pain.     [provider]  pantoprazole (PROTONIX) 40 MG tablet TAKE 1 TABLET BY MOUTH DAILY. Patient taking differently: Take 40 mg by mouth daily.  05/27/18   Annitta Needs, NP  potassium chloride SA (K-DUR) 20 MEQ tablet Take 20 mEq by mouth once.    Lucianne Lei, MD  predniSONE (DELTASONE) 10 MG tablet Take 4 tablets (40 mg total) by mouth daily. 09/25/18   Fredia Sorrow, MD  predniSONE (DELTASONE) 20 MG tablet Take 3 tablets by mouth daily x1 day; then 2 tablets by mouth daily x2 days; then 1 tablet by mouth daily x3 days; then half tablet by mouth daily x3 days and stop prednisone. Patient not taking: Reported on 09/17/2018 06/23/18   Barton Dubois, MD  Vitamin D, Ergocalciferol, (DRISDOL) 50000 UNITS CAPS capsule Take 50,000 Units by mouth every Friday.  07/31/14   [provider]    Family History Family History  Problem Relation Age of Onset   Bone cancer Sister    Pancreatic cancer Sister    Pneumonia Mother    Heart attack Father    Diabetes Sister    Dementia Sister    Colon cancer Neg Hx     Social History Social History   Tobacco Use   Smoking status: Former Smoker    Packs/day: 25.00    Years: 1.00    Pack years: 25.00    Types: Cigarettes    Quit date: 08/27/1998    Years  since quitting: 20.2   Smokeless tobacco: Never Used   Tobacco comment: quit in 2000  Substance Use Topics   Alcohol use: No    Alcohol/week: 0.0 standard drinks   Drug use: No     Allergies   Penicillins and Vicodin [hydrocodone-acetaminophen]   Review of Systems Review of Systems  Constitutional: Positive for fever.  Respiratory: Positive for shortness of breath and wheezing. Negative for cough.   Cardiovascular: Negative for chest pain and leg swelling.  Gastrointestinal: Negative for abdominal pain, nausea and vomiting.  Genitourinary: Negative for dysuria.  All other systems reviewed and are negative.    Physical Exam Updated Vital Signs BP (!) 104/52    Pulse (!) 111    Temp 98.3 F (36.8 C) (Oral)  Resp (!) 23    Ht 1.651 m (5\' 5" )    Wt 49.4 kg    SpO2 93%    BMI 18.14 kg/m   Physical Exam Vitals signs and nursing note reviewed.  Constitutional:      Comments: Chronically ill-appearing, nontoxic, mildly tachypneic  HENT:     Head: Normocephalic and atraumatic.     Mouth/Throat:     Comments: Mucous membranes dry Eyes:     Pupils: Pupils are equal, round, and reactive to light.  Cardiovascular:     Rate and Rhythm: Regular rhythm. Tachycardia present.     Heart sounds: Normal heart sounds.  Pulmonary:     Effort: Tachypnea present. No respiratory distress.     Breath sounds: Examination of the right-lower field reveals wheezing. Examination of the left-lower field reveals wheezing. Wheezing present.     Comments: Speaks in short sentences, mild tachypnea, no significant accessory muscle use, diminished breath sounds in all lung fields with slight wheezing in the bilateral lower lung fields, occasional rhonchi noted Abdominal:     General: Bowel sounds are normal.     Palpations: Abdomen is soft.  Musculoskeletal:     Right lower leg: No edema.     Left lower leg: No edema.  Skin:    General: Skin is warm and dry.  Neurological:     Mental Status:  She is oriented to person, place, and time.  Psychiatric:        Mood and Affect: Mood normal.      ED Treatments / Results  Labs (all labs ordered are listed, but only abnormal results are displayed) Labs Reviewed  CBC WITH DIFFERENTIAL/PLATELET - Abnormal; Notable for the following components:      Result Value   WBC 19.6 (*)    Hemoglobin 10.6 (*)    HCT 35.4 (*)    MCHC 29.9 (*)    Neutro Abs 17.6 (*)    Abs Immature Granulocytes 0.09 (*)    All other components within normal limits  BASIC METABOLIC PANEL - Abnormal; Notable for the following components:   Potassium 3.1 (*)    Chloride 94 (*)    CO2 35 (*)    Glucose, Bld 200 (*)    All other components within normal limits  SARS CORONAVIRUS 2 (HOSPITAL ORDER, [place] [facility])    EKG EKG Interpretation  Date/Time:  Wednesday November 06 2018 23:16:51 EDT Ventricular Rate:  104 PR Interval:    QRS Duration: 93 QT Interval:  364 QTC Calculation: 479 R Axis:   80 Text Interpretation:  Sinus tachycardia Right atrial enlargement Nonspecific repol abnormality, diffuse leads No significant change since last tracing Confirmed by Thayer Jew [phone]) on 11/07/2018 12:08:56 AM   Radiology Dg Chest Portable 1 View  Result Date: 11/06/2018 CLINICAL DATA:  73 year old female with shortness of breath and cough. EXAM: PORTABLE CHEST 1 VIEW COMPARISON:  09/25/2018 and earlier. FINDINGS: Portable AP upright view at 2327 hours. Chronic large lung volumes. Chronic nodular bilateral pulmonary interstitial opacity. No pneumothorax, pleural effusion or acute pulmonary opacity identified. Normal cardiac size and mediastinal contours. Visualized tracheal air column is within normal limits. No acute osseous abnormality identified. IMPRESSION: Chronic lung disease. No superimposed acute findings are identified. Electronically Signed   By: Genevie Ann M.D.   On: 11/06/2018 23:42    Procedures Procedures (including critical  care time)  CRITICAL CARE Performed by: Barbette Hair Lawyer Washabaugh   Total critical care time: 40 minutes  Critical care time was exclusive of separately billable procedures and treating other patients.  Critical care was necessary to treat or prevent imminent or life-threatening deterioration.  Critical care was time spent personally by me on the following activities: development of treatment plan with patient and/or surrogate as well as nursing, discussions with consultants, evaluation of patient's response to treatment, examination of patient, obtaining history from patient or surrogate, ordering and performing treatments and interventions, ordering and review of laboratory studies, ordering and review of radiographic studies, pulse oximetry and re-evaluation of patient's condition.   Medications Ordered in ED Medications  cefTRIAXone (ROCEPHIN) 1 g in sodium chloride 0.9 % 100 mL IVPB (has no administration in time range)  azithromycin (ZITHROMAX) 500 mg in sodium chloride 0.9 % 250 mL IVPB (has no administration in time range)  albuterol (VENTOLIN HFA) 108 (90 Base) MCG/ACT inhaler 8 puff (8 puffs Inhalation Given 11/06/18 2319)  magnesium sulfate IVPB 2 g 50 mL (0 g Intravenous Stopped 11/06/18 2337)  ondansetron (ZOFRAN) injection 4 mg (4 mg Intravenous Given 11/06/18 2332)  albuterol (VENTOLIN HFA) 108 (90 Base) MCG/ACT inhaler 8 puff (8 puffs Inhalation Given 11/07/18 0023)  albuterol (VENTOLIN) (5 MG/ML) 0.5% continuous inhalation solution (  Given 11/07/18 0121)  albuterol (PROVENTIL,VENTOLIN) solution continuous neb (10 mg/hr Nebulization Given During Downtime 11/07/18 0100)     Initial Impression / Assessment and Plan / ED Course  I have reviewed the triage vital signs and the nursing notes.  Pertinent labs & imaging results that were available during my care of the patient were reviewed by me and considered in my medical decision making (see chart for details).  Clinical Course as of  Nov 06 348  Thu Nov 07, 2018  0350 Patient was weaned to 4 L.  Attempts to ambulate with pulse ox she became more tachypneic and dropped her O2 sats in the low 70s requiring escalating amounts of oxygen.  Given this, will admit for COPD exacerbation.  She does have a white count of 19.6.  Denies any recent steroid use over the last 1 to 2 weeks.  Will cover for pneumonia although chest x-ray is clear.   [CH]    Clinical Course User Index [CH] Naser Schuld, Barbette Hair, MD       Patient presents with acute onset shortness of breath.  History of COPD.  Was noted to be hypoxic on her home oxygen at home.  She is wheezing on exam and in mild respiratory distress.  Patient was given 8 puffs of inhaler x2.  She was also given IV magnesium.  She received Solu-Medrol by EMS.  Chest x-ray shows no evidence of pneumonia.  EKG is without ischemia or arrhythmia.  Coronavirus testing is negative.  After this result, patient was placed on a continuous DuoNeb.  She was able to be weaned to 4 L of nasal cannula.  She is normally on 2.  See clinical course above.  Given decompensation with ambulation, feel she warrants admission for COPD exacerbation.  She was covered with Rocephin and azithromycin given leukocytosis although chest x-ray is clear.  Final Clinical Impressions(s) / ED Diagnoses   Final diagnoses:  COPD exacerbation [facility])  Hypoxia    ED Discharge Orders    None       Merryl Hacker, MD 11/07/18 [phone]

## 2018-11-07 ENCOUNTER — Other Ambulatory Visit: Payer: Self-pay

## 2018-11-07 ENCOUNTER — Encounter (HOSPITAL_COMMUNITY): Payer: Self-pay | Admitting: Internal Medicine

## 2018-11-07 DIAGNOSIS — J441 Chronic obstructive pulmonary disease with (acute) exacerbation: Secondary | ICD-10-CM | POA: Diagnosis not present

## 2018-11-07 DIAGNOSIS — E876 Hypokalemia: Secondary | ICD-10-CM | POA: Diagnosis present

## 2018-11-07 DIAGNOSIS — R739 Hyperglycemia, unspecified: Secondary | ICD-10-CM | POA: Diagnosis present

## 2018-11-07 LAB — CBC WITH DIFFERENTIAL/PLATELET
Abs Immature Granulocytes: 0.09 10*3/uL — ABNORMAL HIGH (ref 0.00–0.07)
Basophils Absolute: 0 10*3/uL (ref 0.0–0.1)
Basophils Relative: 0 %
Eosinophils Absolute: 0.2 10*3/uL (ref 0.0–0.5)
Eosinophils Relative: 1 %
HCT: 35.4 % — ABNORMAL LOW (ref 36.0–46.0)
Hemoglobin: 10.6 g/dL — ABNORMAL LOW (ref 12.0–15.0)
Immature Granulocytes: 1 %
Lymphocytes Relative: 5 %
Lymphs Abs: 0.9 10*3/uL (ref 0.7–4.0)
MCH: 27.3 pg (ref 26.0–34.0)
MCHC: 29.9 g/dL — ABNORMAL LOW (ref 30.0–36.0)
MCV: 91.2 fL (ref 80.0–100.0)
Monocytes Absolute: 0.7 10*3/uL (ref 0.1–1.0)
Monocytes Relative: 3 %
Neutro Abs: 17.6 10*3/uL — ABNORMAL HIGH (ref 1.7–7.7)
Neutrophils Relative %: 90 %
Platelets: 263 10*3/uL (ref 150–400)
RBC: 3.88 MIL/uL (ref 3.87–5.11)
RDW: 14 % (ref 11.5–15.5)
WBC: 19.6 10*3/uL — ABNORMAL HIGH (ref 4.0–10.5)
nRBC: 0 % (ref 0.0–0.2)

## 2018-11-07 LAB — GLUCOSE, CAPILLARY
Glucose-Capillary: 160 mg/dL — ABNORMAL HIGH (ref 70–99)
Glucose-Capillary: 120 mg/dL — ABNORMAL HIGH (ref 70–99)
Glucose-Capillary: 174 mg/dL — ABNORMAL HIGH (ref 70–99)
Glucose-Capillary: 164 mg/dL — ABNORMAL HIGH (ref 70–99)

## 2018-11-07 LAB — BASIC METABOLIC PANEL
Anion gap: 14 (ref 5–15)
BUN: 13 mg/dL (ref 8–23)
CO2: 35 mmol/L — ABNORMAL HIGH (ref 22–32)
Calcium: 9.6 mg/dL (ref 8.9–10.3)
Chloride: 94 mmol/L — ABNORMAL LOW (ref 98–111)
Creatinine, Ser: 0.79 mg/dL (ref 0.44–1.00)
GFR calc Af Amer: >60 mL/min (ref >60)
GFR calc non Af Amer: >60 mL/min (ref >60)
Glucose, Bld: 200 mg/dL — ABNORMAL HIGH (ref 70–99)
Potassium: 3.1 mmol/L — ABNORMAL LOW (ref 3.5–5.1)
Sodium: 143 mmol/L (ref 135–145)

## 2018-11-07 LAB — SARS CORONAVIRUS 2 BY RT PCR (HOSPITAL ORDER, PERFORMED IN ~~LOC~~ HOSPITAL LAB): SARS Coronavirus 2: NEGATIVE

## 2018-11-07 LAB — HEMOGLOBIN A1C
Hgb A1c MFr Bld: 5.6 % (ref 4.8–5.6)
Mean Plasma Glucose: 114.02 mg/dL

## 2018-11-07 MED ORDER — ALBUTEROL (5 MG/ML) CONTINUOUS INHALATION SOLN
10.0000 mg/h | INHALATION_SOLUTION | Freq: Once | RESPIRATORY_TRACT | Status: AC
  Administered 2018-11-07: 10 mg/h via RESPIRATORY_TRACT

## 2018-11-07 MED ORDER — BENZONATATE 100 MG PO CAPS
200.0000 mg | ORAL_CAPSULE | Freq: Three times a day (TID) | ORAL | Status: DC | PRN
  Administered 2018-11-07: 200 mg via ORAL
  Filled 2018-11-07: qty 2

## 2018-11-07 MED ORDER — IPRATROPIUM-ALBUTEROL 0.5-2.5 (3) MG/3ML IN SOLN
3.0000 mL | Freq: Four times a day (QID) | RESPIRATORY_TRACT | Status: DC
  Administered 2018-11-07 – 2018-11-08 (×5): 3 mL via RESPIRATORY_TRACT
  Filled 2018-11-07 (×4): qty 3

## 2018-11-07 MED ORDER — ENOXAPARIN SODIUM 40 MG/0.4ML ~~LOC~~ SOLN
SUBCUTANEOUS | Status: AC
  Filled 2018-11-07: qty 0.4

## 2018-11-07 MED ORDER — ALPRAZOLAM 0.5 MG PO TABS: 1.0000 mg | ORAL_TABLET | Freq: Three times a day (TID) | ORAL | Status: DC | PRN

## 2018-11-07 MED ORDER — PANTOPRAZOLE SODIUM 40 MG PO TBEC
40.0000 mg | DELAYED_RELEASE_TABLET | Freq: Every day | ORAL | Status: DC
  Administered 2018-11-07 – 2018-11-08 (×2): 40 mg via ORAL
  Filled 2018-11-07 (×2): qty 1

## 2018-11-07 MED ORDER — ALPRAZOLAM 0.5 MG PO TABS: 0.5000 mg | ORAL_TABLET | Freq: Three times a day (TID) | ORAL | Status: DC | PRN

## 2018-11-07 MED ORDER — SODIUM CHLORIDE 0.9 % IV SOLN
1.0000 g | INTRAVENOUS | Status: DC
  Administered 2018-11-08: 1 g via INTRAVENOUS
  Filled 2018-11-07: qty 10

## 2018-11-07 MED ORDER — INSULIN ASPART 100 UNIT/ML ~~LOC~~ SOLN
0.0000 [IU] | Freq: Three times a day (TID) | SUBCUTANEOUS | Status: DC
  Administered 2018-11-07 – 2018-11-08 (×3): 3 [IU] via SUBCUTANEOUS
  Administered 2018-11-08: 2 [IU] via SUBCUTANEOUS

## 2018-11-07 MED ORDER — ALBUTEROL (5 MG/ML) CONTINUOUS INHALATION SOLN
INHALATION_SOLUTION | RESPIRATORY_TRACT | Status: AC
  Administered 2018-11-07: 01:00:00
  Filled 2018-11-07: qty 20

## 2018-11-07 MED ORDER — SODIUM CHLORIDE 0.9 % IV SOLN
500.0000 mg | INTRAVENOUS | Status: DC
  Administered 2018-11-07 – 2018-11-08 (×2): 500 mg via INTRAVENOUS
  Filled 2018-11-07 (×2): qty 500

## 2018-11-07 MED ORDER — METHYLPREDNISOLONE SODIUM SUCC 40 MG IJ SOLR
40.0000 mg | Freq: Four times a day (QID) | INTRAMUSCULAR | Status: AC
  Administered 2018-11-07 – 2018-11-08 (×4): 40 mg via INTRAVENOUS
  Filled 2018-11-07 (×4): qty 1

## 2018-11-07 MED ORDER — SODIUM CHLORIDE 0.9 % IV SOLN: 500.0000 mg | INTRAVENOUS | Status: DC

## 2018-11-07 MED ORDER — PREDNISONE 20 MG PO TABS
40.0000 mg | ORAL_TABLET | Freq: Every day | ORAL | Status: DC
  Administered 2018-11-08: 40 mg via ORAL
  Filled 2018-11-07: qty 2

## 2018-11-07 MED ORDER — ALBUTEROL SULFATE HFA 108 (90 BASE) MCG/ACT IN AERS
8.0000 | INHALATION_SPRAY | Freq: Once | RESPIRATORY_TRACT | Status: AC
  Administered 2018-11-07: 8 via RESPIRATORY_TRACT

## 2018-11-07 MED ORDER — LEVOTHYROXINE SODIUM 75 MCG PO TABS
75.0000 ug | ORAL_TABLET | Freq: Every day | ORAL | Status: DC
  Administered 2018-11-08: 75 ug via ORAL
  Filled 2018-11-07: qty 1

## 2018-11-07 MED ORDER — ENOXAPARIN SODIUM 30 MG/0.3ML ~~LOC~~ SOLN
30.0000 mg | SUBCUTANEOUS | Status: DC
  Administered 2018-11-08: 30 mg via SUBCUTANEOUS
  Filled 2018-11-07: qty 0.3

## 2018-11-07 MED ORDER — ALBUTEROL SULFATE (2.5 MG/3ML) 0.083% IN NEBU: 2.5000 mg | INHALATION_SOLUTION | Freq: Four times a day (QID) | RESPIRATORY_TRACT | Status: DC | PRN

## 2018-11-07 MED ORDER — ENSURE ENLIVE PO LIQD: 237.0000 mL | ORAL | Status: DC

## 2018-11-07 MED ORDER — SODIUM CHLORIDE 0.9 % IV SOLN
1.0000 g | Freq: Once | INTRAVENOUS | Status: AC
  Administered 2018-11-07: 1 g via INTRAVENOUS
  Filled 2018-11-07: qty 10

## 2018-11-07 MED ORDER — ENOXAPARIN SODIUM 40 MG/0.4ML ~~LOC~~ SOLN: 40.0000 mg | SUBCUTANEOUS | Status: DC

## 2018-11-07 MED ORDER — POTASSIUM CHLORIDE CRYS ER 20 MEQ PO TBCR
40.0000 meq | EXTENDED_RELEASE_TABLET | Freq: Once | ORAL | Status: AC
  Administered 2018-11-07: 40 meq via ORAL
  Filled 2018-11-07: qty 2

## 2018-11-07 NOTE — ED Notes (Signed)
ED TO INPATIENT HANDOFF REPORT  ED Nurse Name and Phone #: [phone]  S Name/Age/Gender Marilyn Solomon 73 y.o. female Room/Bed: APA02/APA02  Code Status   Code Status: Full Code  Home/SNF/Other Home Patient oriented to: self Is this baseline? Yes   Triage Complete: Triage complete  Chief Complaint Shortness Of Breath  Triage Note Per pt she developed sob tonight. Pt states she took 5 breathing tx at home with no relief. Pt is on continuous o2 at home 2lpm. Pt was 78% on 2lpm when ems arrived. Ems gave pt 125mg  solumedrol and placed her on nrb.   Allergies Allergies  Allergen Reactions  . Penicillins Itching    Has patient had a PCN reaction causing immediate rash, facial/tongue/throat swelling, SOB or lightheadedness with hypotension: no Has patient had a PCN reaction causing severe rash involving mucus membranes or skin necrosis: No Has patient had a PCN reaction that required hospitalization No Has patient had a PCN reaction occurring within the last 10 years: No If all of the above answers are "NO", then may proceed with Cephalosporin use.   . Vicodin [Hydrocodone-Acetaminophen] Nausea And Vomiting    Level of Care/Admitting Diagnosis ED Disposition    ED Disposition Condition Comment   Admit  Hospital Area: [facility] [[id number]]  Level of Care: Stepdown [14]  Covid Evaluation: Confirmed COVID Negative  Diagnosis: COPD exacerbation [facility]) [[id number]]  Admitting Physician: Reubin Milan [[id number]]  Attending Physician: Reubin Milan [[id number]]  PT Class (Do Not Modify): Observation [104]  PT Acc Code (Do Not Modify): Observation [10022]       B Medical/Surgery History Past Medical History:  Diagnosis Date  . Anemia   . Anxiety   . Arthritis   . Chronic back pain   . COPD (chronic obstructive pulmonary disease) ([place])   . GERD (gastroesophageal reflux disease)   . Hypertension   . Hypothyroidism   . Reflux    no medications currently,  asymptomatic  . Thyroid disease    Past Surgical History:  Procedure Laterality Date  . ABDOMINAL HYSTERECTOMY    . BIOPSY  10/08/2017   Procedure: BIOPSY;  Surgeon: Daneil Dolin, MD;  [facility];  Service: Endoscopy;;  gastric  . cataracts Bilateral   . COLONOSCOPY  2007   Dr. Gala Romney: normal rectum, diverticula   . COLONOSCOPY WITH PROPOFOL N/A 11/15/2015   Dr. Gala Romney: grade II hemorrhoids, diverticulosis  . COLONOSCOPY WITH PROPOFOL N/A 10/08/2017   Procedure: COLONOSCOPY WITH PROPOFOL;  Surgeon: Daneil Dolin, MD;  [facility];  Service: Endoscopy;  Laterality: N/A;  11:15am  . ESOPHAGOGASTRODUODENOSCOPY (EGD) WITH PROPOFOL N/A 10/08/2017   Procedure: ESOPHAGOGASTRODUODENOSCOPY (EGD) WITH PROPOFOL;  Surgeon: Daneil Dolin, MD;  [facility];  Service: Endoscopy;  Laterality: N/A;  . FRACTURE SURGERY Right 2005   Wrist  . JOINT REPLACEMENT Left 2000   hip  . TOTAL HIP ARTHROPLASTY Left   . WRIST SURGERY Right      A IV Location/Drains/Wounds Patient Lines/Drains/Airways Status   Active Line/Drains/Airways    Name:   Placement date:   Placement time:   Site:   Days:   Peripheral IV 11/06/18 Left Wrist   11/06/18    2312    Wrist   1          Intake/Output Last 24 hours No intake or output data in the 24 hours ending 11/07/18 0458  Labs/Imaging Results for orders placed or performed during the hospital  encounter of 11/06/18 (from the past 48 hour(s))  SARS Coronavirus 2 (CEPHEID- Performed in [place] hospital lab), Hosp Order     Status: None   Collection Time: 11/06/18 11:11 PM   Specimen: Nasopharyngeal Swab  Result Value Ref Range   SARS Coronavirus 2 NEGATIVE NEGATIVE    Comment: (NOTE) If result is NEGATIVE SARS-CoV-2 target nucleic acids are NOT DETECTED. The SARS-CoV-2 RNA is generally detectable in upper and lower  respiratory specimens during the acute phase of infection. The lowest  concentration of SARS-CoV-2 viral  copies this assay can detect is 250  copies / mL. A negative result does not preclude SARS-CoV-2 infection  and should not be used as the sole basis for treatment or other  patient management decisions.  A negative result may occur with  improper specimen collection / handling, submission of specimen other  than nasopharyngeal swab, presence of viral mutation(s) within the  areas targeted by this assay, and inadequate number of viral copies  (<250 copies / mL). A negative result must be combined with clinical  observations, patient history, and epidemiological information. If result is POSITIVE SARS-CoV-2 target nucleic acids are DETECTED. The SARS-CoV-2 RNA is generally detectable in upper and lower  respiratory specimens dur ing the acute phase of infection.  Positive  results are indicative of active infection with SARS-CoV-2.  Clinical  correlation with patient history and other diagnostic information is  necessary to determine patient infection status.  Positive results do  not rule out bacterial infection or co-infection with other viruses. If result is PRESUMPTIVE POSTIVE SARS-CoV-2 nucleic acids MAY BE PRESENT.   A presumptive positive result was obtained on the submitted specimen  and confirmed on repeat testing.  While 2019 novel coronavirus  (SARS-CoV-2) nucleic acids may be present in the submitted sample  additional confirmatory testing may be necessary for epidemiological  and / or clinical management purposes  to differentiate between  SARS-CoV-2 and other Sarbecovirus currently known to infect humans.  If clinically indicated additional testing with an alternate test  methodology [phone]) is advised. The SARS-CoV-2 RNA is generally  detectable in upper and lower respiratory sp ecimens during the acute  phase of infection. The expected result is Negative. Fact Sheet for Patients:  StrictlyIdeas.no Fact Sheet for Healthcare  Providers: BankingDealers.co.za This test is not yet approved or cleared by the [place] FDA and has been authorized for detection and/or diagnosis of SARS-CoV-2 by FDA under an Emergency Use Authorization (EUA).  This EUA will remain in effect (meaning this test can be used) for the duration of the COVID-19 declaration under Section 564(b)(1) of the Act, 21 U.S.C. section 360bbb-3(b)(1), unless the authorization is terminated or revoked sooner. Performed at [facility], [address] [place], [place] [address]   CBC with Differential     Status: Abnormal   Collection Time: 11/07/18 12:23 AM  Result Value Ref Range   WBC 19.6 (H) 4.0 - 10.5 K/uL   RBC 3.88 3.87 - 5.11 MIL/uL   Hemoglobin 10.6 (L) 12.0 - 15.0 g/dL   HCT 35.4 (L) 36.0 - 46.0 %   MCV 91.2 80.0 - 100.0 fL   MCH 27.3 26.0 - 34.0 pg   MCHC 29.9 (L) 30.0 - 36.0 g/dL   RDW 14.0 11.5 - 15.5 %   Platelets 263 150 - 400 K/uL   nRBC 0.0 0.0 - 0.2 %   Neutrophils Relative % 90 %   Neutro Abs 17.6 (H) 1.7 - 7.7 K/uL  Lymphocytes Relative 5 %   Lymphs Abs 0.9 0.7 - 4.0 K/uL   Monocytes Relative 3 %   Monocytes Absolute 0.7 0.1 - 1.0 K/uL   Eosinophils Relative 1 %   Eosinophils Absolute 0.2 0.0 - 0.5 K/uL   Basophils Relative 0 %   Basophils Absolute 0.0 0.0 - 0.1 K/uL   Immature Granulocytes 1 %   Abs Immature Granulocytes 0.09 (H) 0.00 - 0.07 K/uL    Comment: Performed at [facility], [address], [place], [place] [address]  Basic metabolic panel     Status: Abnormal   Collection Time: 11/07/18 12:23 AM  Result Value Ref Range   Sodium 143 135 - 145 mmol/L   Potassium 3.1 (L) 3.5 - 5.1 mmol/L   Chloride 94 (L) 98 - 111 mmol/L   CO2 35 (H) 22 - 32 mmol/L   Glucose, Bld 200 (H) 70 - 99 mg/dL   BUN 13 8 - 23 mg/dL   Creatinine, Ser 0.79 0.44 - 1.00 mg/dL   Calcium 9.6 8.9 - 10.3 mg/dL   GFR calc non Af Amer >60 >60 mL/min   GFR calc Af Amer >60 >60 mL/min   Anion gap 14 5 - 15     Comment: Performed at [facility], [address] [place], [place] [address]   Dg Chest Portable 1 View  Result Date: 11/06/2018 CLINICAL DATA:  73 year old female with shortness of breath and cough. EXAM: PORTABLE CHEST 1 VIEW COMPARISON:  09/25/2018 and earlier. FINDINGS: Portable AP upright view at 2327 hours. Chronic large lung volumes. Chronic nodular bilateral pulmonary interstitial opacity. No pneumothorax, pleural effusion or acute pulmonary opacity identified. Normal cardiac size and mediastinal contours. Visualized tracheal air column is within normal limits. No acute osseous abnormality identified. IMPRESSION: Chronic lung disease. No superimposed acute findings are identified. Electronically Signed   By: Genevie Ann M.D.   On: 11/06/2018 23:42    Pending Labs Unresulted Labs (From admission, onward)    Start     Ordered   11/14/18 0500  Creatinine, serum  (enoxaparin (LOVENOX)    CrCl >/= 30 ml/min)  Weekly,   R    Comments: while on enoxaparin therapy    11/07/18 0420   11/07/18 0422  Gram stain  Once,   STAT     11/07/18 0422   11/07/18 0422  Strep pneumoniae urinary antigen  Once,   STAT     11/07/18 0422   11/07/18 0422  Culture, sputum-assessment  Once,   STAT     11/07/18 0422   11/07/18 0418  HIV antibody  Once,   STAT     11/07/18 0420          Vitals/Pain Today's Vitals   11/07/18 0230 11/07/18 0330 11/07/18 0400 11/07/18 0418  BP: (!) 127/58 (!) 104/52 104/60   Pulse: (!) 125 (!) 111 (!) 102   Resp: (!) 23  18   Temp:      TempSrc:      SpO2: 91% 93% 95% 95%  Weight:      Height:      PainSc:        Isolation Precautions Droplet and Contact precautions  Medications Medications  azithromycin (ZITHROMAX) 500 mg in sodium chloride 0.9 % 250 mL IVPB (500 mg Intravenous New Bag/Given 11/07/18 0436)  methylPREDNISolone sodium succinate (SOLU-MEDROL) 40 mg/mL injection 40 mg (has no administration in time range)    Followed by  predniSONE (DELTASONE) tablet  40 mg (has no administration  in time range)  ipratropium-albuterol (DUONEB) 0.5-2.5 (3) MG/3ML nebulizer solution 3 mL (has no administration in time range)  albuterol (PROVENTIL) (2.5 MG/3ML) 0.083% nebulizer solution 2.5 mg (has no administration in time range)  cefTRIAXone (ROCEPHIN) 1 g in sodium chloride 0.9 % 100 mL IVPB (has no administration in time range)  enoxaparin (LOVENOX) 40 MG/0.4ML injection (has no administration in time range)  enoxaparin (LOVENOX) injection 30 mg (has no administration in time range)  albuterol (VENTOLIN HFA) 108 (90 Base) MCG/ACT inhaler 8 puff (8 puffs Inhalation Given 11/06/18 2319)  magnesium sulfate IVPB 2 g 50 mL (0 g Intravenous Stopped 11/06/18 2337)  ondansetron (ZOFRAN) injection 4 mg (4 mg Intravenous Given 11/06/18 2332)  albuterol (VENTOLIN HFA) 108 (90 Base) MCG/ACT inhaler 8 puff (8 puffs Inhalation Given 11/07/18 0023)  albuterol (VENTOLIN) (5 MG/ML) 0.5% continuous inhalation solution (  Given 11/07/18 0121)  albuterol (PROVENTIL,VENTOLIN) solution continuous neb (10 mg/hr Nebulization Given During Downtime 11/07/18 0100)  cefTRIAXone (ROCEPHIN) 1 g in sodium chloride 0.9 % 100 mL IVPB (0 g Intravenous Stopped 11/07/18 0432)  potassium chloride SA (K-DUR) CR tablet 40 mEq (40 mEq Oral Given 11/07/18 0437)    Mobility walks Low fall risk   Focused Assessments Pulmonary Assessment Handoff:  Lung sounds: Bilateral Breath Sounds: Clear, Diminished, Rhonchi L Breath Sounds: Rhonchi R Breath Sounds: Rhonchi O2 Device: Nasal Cannula O2 Flow Rate (L/min): 4 L/min      R Recommendations: See Admitting Provider Note  Report given to:   Additional Notes: Pt is on continuous oxygen at 2lpm at home.

## 2018-11-07 NOTE — H&P (Signed)
History and Physical    [name] MOQ:[id number] DOB: 06-24-1945 DOA: 11/06/2018  PCP: Lucianne Lei, MD   Patient coming from: Home.  I have personally briefly reviewed patient's old medical records in [place]  Chief Complaint: Shortness of breath.  HPI: Marilyn Solomon is a 73 y.o. female with medical history significant of anemia, anxiety, osteoarthritis, chronic back pain, COPD, GERD, hypertension, hypothyroidism who is coming to the emergency department due to progressively worse dyspnea since about 1900 yesterday evening that has not responded to 5 different breathing treatments at home.  No exposure to allergens or fumes.  No travel history.  She denies fever, chills, sore throat, renal area or hemoptysis.  No chest pain, palpitations, dizziness, diaphoresis, PND, orthopnea or pitting edema of the lower extremities.  She denies abdominal pain, nausea or vomiting, diarrhea, constipation, melena or hematochezia.  No dysuria, frequency hematuria.  Denies polyuria, polydipsia, polyphagia or blurred vision.    ED Course: She was given 125 mg of Solu-Medrol by EMS.  Initial vital signs temperature 98.3 F, pulse 113, respirations 22, blood pressure 169/82 mmHg and O2 sat 100% on room air.  She was given developmental oxygen, bronchodilators and magnesium sulfate.  A trial of ambulation resulted in the patient becoming severely hypoxic in the 70s and needing NRB mask oxygen.  His white count was 19.6 with 90% neutrophils, 5% lymphocytes and 3% monocytes.  Hemoglobin 10.6 g/dL and platelets 263.  BMP shows a potassium of 3.1, chloride 94 CO2 of 35 glucose of 200 mg/dL. Chest radiograph shows chronic lung disease with chronic nodular bilateral pulmonary interstitial opacities.  Review of Systems: As per HPI otherwise 10 point review of systems negative.   Past Medical History:  Diagnosis Date   Anemia    Anxiety    Arthritis    Chronic back pain    COPD (chronic  obstructive pulmonary disease) ([facility])    GERD (gastroesophageal reflux disease)    Hypertension    Hypothyroidism    Reflux    no medications currently, asymptomatic   Thyroid disease     Past Surgical History:  Procedure Laterality Date   ABDOMINAL HYSTERECTOMY     BIOPSY  10/08/2017   Procedure: BIOPSY;  Surgeon: Daneil Dolin, MD;  [facility];  Service: Endoscopy;;  gastric   cataracts Bilateral    COLONOSCOPY  2007   Dr. Gala Romney: normal rectum, diverticula    COLONOSCOPY WITH PROPOFOL N/A 11/15/2015   Dr. Gala Romney: grade II hemorrhoids, diverticulosis   COLONOSCOPY WITH PROPOFOL N/A 10/08/2017   Procedure: COLONOSCOPY WITH PROPOFOL;  Surgeon: Daneil Dolin, MD;  [facility];  Service: Endoscopy;  Laterality: N/A;  11:15am   ESOPHAGOGASTRODUODENOSCOPY (EGD) WITH PROPOFOL N/A 10/08/2017   Procedure: ESOPHAGOGASTRODUODENOSCOPY (EGD) WITH PROPOFOL;  Surgeon: Daneil Dolin, MD;  [facility];  Service: Endoscopy;  Laterality: N/A;   FRACTURE SURGERY Right 2005   Wrist   JOINT REPLACEMENT Left 2000   hip   TOTAL HIP ARTHROPLASTY Left    WRIST SURGERY Right      reports that she quit smoking about 20 years ago. Her smoking use included cigarettes. She has a 25.00 pack-year smoking history. She has never used smokeless tobacco. She reports that she does not drink alcohol or use drugs.  Allergies  Allergen Reactions   Penicillins Itching    Has patient had a PCN reaction causing immediate rash, facial/tongue/throat swelling, SOB or lightheadedness with hypotension: no Has patient  had a PCN reaction causing severe rash involving mucus membranes or skin necrosis: No Has patient had a PCN reaction that required hospitalization No Has patient had a PCN reaction occurring within the last 10 years: No If all of the above answers are "NO", then may proceed with Cephalosporin use.    Vicodin [Hydrocodone-Acetaminophen] Nausea And Vomiting     Family History  Problem Relation Age of Onset   Bone cancer Sister    Pancreatic cancer Sister    Pneumonia Mother    Heart attack Father    Diabetes Sister    Dementia Sister    Colon cancer Neg Hx    Prior to Admission medications   Medication Sig Start Date End Date Taking? Authorizing Provider  ALPRAZolam Duanne Moron) 1 MG tablet Take 1 mg by mouth every 8 (eight) hours as needed for anxiety.  08/10/14   [provider]  benzonatate (TESSALON) 200 MG capsule Take 1 capsule (200 mg total) by mouth 3 (three) times daily as needed (Excessive coughing spells). 06/23/18   Barton Dubois, MD  feeding supplement, ENSURE ENLIVE, (ENSURE ENLIVE) LIQD Take 237 mLs by mouth 2 (two) times daily between meals. Patient not taking: Reported on 09/17/2018 06/24/18   Barton Dubois, MD  ferrous sulfate 325 (65 FE) MG tablet Take 1 tablet (325 mg total) by mouth daily. Patient not taking: Reported on 09/17/2018 06/04/18 06/04/19  Heath Lark D, DO  Fluticasone-Salmeterol (ADVAIR DISKUS) 250-50 MCG/DOSE AEPB Inhale 1 puff into the lungs 2 (two) times daily. 06/23/18 06/23/19  Barton Dubois, MD  ipratropium-albuterol (DUONEB) 0.5-2.5 (3) MG/3ML SOLN Take 3 mLs by nebulization every 6 (six) hours as needed (shortness of breath or wheezing). 06/04/18 07/04/18  Manuella Ghazi, Pratik D, DO  levothyroxine (SYNTHROID, LEVOTHROID) 75 MCG tablet Take 75 mcg by mouth daily.      [provider]  Multiple Vitamin (MULTIVITAMIN WITH MINERALS) TABS tablet Take 1 tablet by mouth daily after supper.    [provider]  oxyCODONE (ROXICODONE) 15 MG immediate release tablet Take 15 mg by mouth 3 (three) times daily as needed for pain.     [provider]  pantoprazole (PROTONIX) 40 MG tablet TAKE 1 TABLET BY MOUTH DAILY. Patient taking differently: Take 40 mg by mouth daily.  05/27/18   Annitta Needs, NP  potassium chloride SA (K-DUR) 20 MEQ tablet Take 20 mEq by mouth once.    Lucianne Lei, MD    predniSONE (DELTASONE) 10 MG tablet Take 4 tablets (40 mg total) by mouth daily. 09/25/18   Fredia Sorrow, MD  predniSONE (DELTASONE) 20 MG tablet Take 3 tablets by mouth daily x1 day; then 2 tablets by mouth daily x2 days; then 1 tablet by mouth daily x3 days; then half tablet by mouth daily x3 days and stop prednisone. Patient not taking: Reported on 09/17/2018 06/23/18   Barton Dubois, MD  Vitamin D, Ergocalciferol, (DRISDOL) 50000 UNITS CAPS capsule Take 50,000 Units by mouth every Friday.  07/31/14   [provider]    Physical Exam: Vitals:   11/07/18 0230 11/07/18 0330 11/07/18 0400 11/07/18 0418  BP: (!) 127/58 (!) 104/52 104/60   Pulse: (!) 125 (!) 111 (!) 102   Resp: (!) 23  18   Temp:      TempSrc:      SpO2: 91% 93% 95% 95%  Weight:      Height:        Constitutional: NAD, calm, comfortable Eyes: PERRL, lids and conjunctivae normal ENMT:  Mucous membranes are dry. Posterior pharynx clear of any exudate or lesions. Neck: normal, supple, no masses, no thyromegaly Respiratory: Decreased breath sounds with wheezing and mild rhonchi bilaterally. Normal respiratory effort. No accessory muscle use.  Cardiovascular: Regular rate and rhythm, no murmurs / rubs / gallops. No extremity edema. 2+ pedal pulses. No carotid bruits.  Abdomen: Soft, no tenderness, no masses palpated. No hepatosplenomegaly. Bowel sounds positive.  Musculoskeletal: no clubbing / cyanosis. Good ROM, no contractures. Normal muscle tone.  Skin: no rashes, lesions, ulcers on limited dermatological examination. Neurologic: CN 2-12 grossly intact. Sensation intact, DTR normal. Strength 5/5 in all 4.  Psychiatric: Normal judgment and insight. Alert and oriented x 3. Normal mood.   Labs on Admission: I have personally reviewed following labs and imaging studies  CBC: Recent Labs  Lab 11/07/18 0023  WBC 19.6*  NEUTROABS 17.6*  HGB 10.6*  HCT 35.4*  MCV 91.2  PLT 130   Basic Metabolic  Panel: Recent Labs  Lab 11/07/18 0023  NA 143  K 3.1*  CL 94*  CO2 35*  GLUCOSE 200*  BUN 13  CREATININE 0.79  CALCIUM 9.6   GFR: Estimated Creatinine Clearance: 49.6 mL/min (by C-G formula based on SCr of 0.79 mg/dL). Liver Function Tests: No results for input(s): AST, ALT, ALKPHOS, BILITOT, PROT, ALBUMIN in the last 168 hours. No results for input(s): LIPASE, AMYLASE in the last 168 hours. No results for input(s): AMMONIA in the last 168 hours. Coagulation Profile: No results for input(s): INR, PROTIME in the last 168 hours. Cardiac Enzymes: No results for input(s): CKTOTAL, CKMB, CKMBINDEX, TROPONINI in the last 168 hours. BNP (last 3 results) No results for input(s): PROBNP in the last 8760 hours. HbA1C: No results for input(s): HGBA1C in the last 72 hours. CBG: No results for input(s): GLUCAP in the last 168 hours. Lipid Profile: No results for input(s): CHOL, HDL, LDLCALC, TRIG, CHOLHDL, LDLDIRECT in the last 72 hours. Thyroid Function Tests: No results for input(s): TSH, T4TOTAL, FREET4, T3FREE, THYROIDAB in the last 72 hours. Anemia Panel: No results for input(s): VITAMINB12, FOLATE, FERRITIN, TIBC, IRON, RETICCTPCT in the last 72 hours. Urine analysis:    Component Value Date/Time   COLORURINE AMBER (A) 06/21/2018 1450   APPEARANCEUR CLOUDY (A) 06/21/2018 1450   LABSPEC 1.020 06/21/2018 1450   PHURINE 5.0 06/21/2018 1450   GLUCOSEU NEGATIVE 06/21/2018 1450   HGBUR NEGATIVE 06/21/2018 1450   BILIRUBINUR NEGATIVE 06/21/2018 1450   KETONESUR NEGATIVE 06/21/2018 1450   PROTEINUR 30 (A) 06/21/2018 1450   UROBILINOGEN 0.2 03/25/2008 1702   NITRITE NEGATIVE 06/21/2018 1450   LEUKOCYTESUR TRACE (A) 06/21/2018 1450    Radiological Exams on Admission: Dg Chest Portable 1 View  Result Date: 11/06/2018 CLINICAL DATA:  73 year old female with shortness of breath and cough. EXAM: PORTABLE CHEST 1 VIEW COMPARISON:  09/25/2018 and earlier. FINDINGS: Portable AP upright  view at 2327 hours. Chronic large lung volumes. Chronic nodular bilateral pulmonary interstitial opacity. No pneumothorax, pleural effusion or acute pulmonary opacity identified. Normal cardiac size and mediastinal contours. Visualized tracheal air column is within normal limits. No acute osseous abnormality identified. IMPRESSION: Chronic lung disease. No superimposed acute findings are identified. Electronically Signed   By: Genevie Ann M.D.   On: 11/06/2018 23:42    EKG: Independently reviewed.  Vent. rate 104 BPM PR interval * ms QRS duration 93 ms QT/QTc 364/479 ms P-R-T axes 81 80 86 Sinus tachycardia Right atrial enlargement Nonspecific repol abnormality, diffuse leads  Assessment/Plan Principal Problem:  COPD exacerbation ([place]) Observation/telemetry. Continue supplemental oxygen. Scheduled and as needed bronchodilators Solu-Medrol 40 mg IVP every 6 hours x 4. Switch to prednisone taper tomorrow morning.  Active Problems:   Hyperglycemia Carbohydrate modified diet. Check hemoglobin A1c. CBG monitoring with RI SS while receiving glucocorticoids.    Hypothyroidism Continue levothyroxine 75 mcg p.o. daily. Monitor TSH as needed.    Normocytic anemia Currently not taking iron. Monitor H&H.    Leukocytosis The patient denies taking prednisone or any other glucocorticoid recently. Continue IV antibiotics for now.    Hypertension Currently not on medical therapy. Monitor blood pressure. PRN antihypertensives. Should follow-up with PCP.    Hypokalemia Replaced.    DVT prophylaxis: Lovenox SQ. Code Status: Full code. Family Communication:  Disposition Plan: Observation for COPD exacerbation treatment. Consults called:  Admission status: Observation/telemetry.   Reubin Milan MD [facility]  11/07/2018, 4:58 AM   This document was prepared using Dragon voice recognition software and may contain some unintended transcription errors.

## 2018-11-07 NOTE — Evaluation (Signed)
Physical Therapy Evaluation Patient Details Name: Marilyn Solomon MRN: [id number] DOB: August 24, 1945 Today's Date: 11/07/2018   History of Present Illness  Marilyn Solomon is a 73 y.o. female with medical history significant of anemia, anxiety, osteoarthritis, chronic back pain, COPD, GERD, hypertension, hypothyroidism who is coming to the emergency department due to progressively worse dyspnea since about 1900 yesterday evening that has not responded to 5 different breathing treatments at home.  No exposure to allergens or fumes.  No travel history.  She denies fever, chills, sore throat, renal area or hemoptysis.  No chest pain, palpitations, dizziness, diaphoresis, PND, orthopnea or pitting edema of the lower extremities.  She denies abdominal pain, nausea or vomiting, diarrhea, constipation, melena or hematochezia.  No dysuria, frequency hematuria.  Denies polyuria, polydipsia, polyphagia or blurred vision.    Clinical Impression  Patient functioning at baseline for functional mobility and gait.  Plan:  Patient discharged from physical therapy to care of nursing for ambulation daily as tolerated for length of stay.     Follow Up Recommendations No PT follow up;Supervision - Intermittent    Equipment Recommendations  None recommended by PT    Recommendations for Other Services       Precautions / Restrictions Precautions Precautions: None Restrictions Weight Bearing Restrictions: No      Mobility  Bed Mobility Overal bed mobility: Modified Independent             General bed mobility comments: increased time  Transfers Overall transfer level: Modified independent               General transfer comment: increased time  Ambulation/Gait Ambulation/Gait assistance: Modified independent (Device/Increase time) Gait Distance (Feet): 100 Feet Assistive device: None Gait Pattern/deviations: WFL(Within Functional Limits) Gait velocity: decreased   General Gait  Details: demonstrates good return for ambulation in room and hallways with decreased velocity which is base line per patient, limited secondary to fatigue, ambulate while on 2 LPM with SpO2 dropping from 95% to 84%  Stairs            Wheelchair Mobility    Modified Rankin (Stroke Patients Only)       Balance Overall balance assessment: No apparent balance deficits (not formally assessed)                                           Pertinent Vitals/Pain Pain Assessment: No/denies pain    Home Living Family/patient expects to be discharged to:: Private residence Living Arrangements: Children Available Help at Discharge: Family Type of Home: House Home Access: Stairs to enter Entrance Stairs-Rails: Right;Left;Can reach both Entrance Stairs-Number of Steps: 2-3 Home Layout: One level Home Equipment: Environmental consultant - 2 wheels;Bedside commode      Prior Function Level of Independence: Independent         Comments: household and short distanced community ambulator     Hand Dominance        Extremity/Trunk Assessment   Upper Extremity Assessment Upper Extremity Assessment: Overall WFL for tasks assessed    Lower Extremity Assessment Lower Extremity Assessment: Overall WFL for tasks assessed    Cervical / Trunk Assessment Cervical / Trunk Assessment: Normal  Communication   Communication: No difficulties  Cognition Arousal/Alertness: Awake/alert Behavior During Therapy: WFL for tasks assessed/performed Overall Cognitive Status: Within Functional Limits for tasks assessed  General Comments      Exercises     Assessment/Plan    PT Assessment Patent does not need any further PT services  PT Problem List         PT Treatment Interventions      PT Goals (Current goals can be found in the Care Plan section)  Acute Rehab PT Goals Patient Stated Goal: return home with family to  assist PT Goal Formulation: With patient Time For Goal Achievement: 11/07/18 Potential to Achieve Goals: Good    Frequency     Barriers to discharge        Co-evaluation               AM-PAC PT "6 Clicks" Mobility  Outcome Measure Help needed turning from your back to your side while in a flat bed without using bedrails?: None Help needed moving from lying on your back to sitting on the side of a flat bed without using bedrails?: None Help needed moving to and from a bed to a chair (including a wheelchair)?: None Help needed standing up from a chair using your arms (e.g., wheelchair or bedside chair)?: None Help needed to walk in hospital room?: None Help needed climbing 3-5 steps with a railing? : None 6 Click Score: 24    End of Session Equipment Utilized During Treatment: Oxygen Activity Tolerance: Patient tolerated treatment well;Patient limited by fatigue Patient left: in chair;with call bell/phone within reach Nurse Communication: Mobility status PT Visit Diagnosis: Unsteadiness on feet (R26.81);Other abnormalities of gait and mobility (R26.89);Muscle weakness (generalized) (M62.81)    Time: 4975-3005 PT Time Calculation (min) (ACUTE ONLY): 18 min   Charges:   PT Evaluation $PT Eval Low Complexity: 1 Low PT Treatments $Gait Training: 8-22 mins        3:17 PM, 11/07/18 Lonell Grandchild, MPT Physical Therapist with [facility] [phone] office [phone] mobile phone

## 2018-11-07 NOTE — Progress Notes (Signed)
Initial Nutrition Assessment  DOCUMENTATION CODES:      INTERVENTION:  Ensure Enlive po daily, supplement provides 350 kcal and 20 grams of protein -recommend she continue at least once daily at home due to underweight status and limited intake of protein/ energy.   Agree with Regular diet   NUTRITION DIAGNOSIS:   Increased nutrient needs related to acute illness, chronic illness(acute on chronic COPD) as evidenced by estimated needs.   GOAL:   Patient will meet greater than or equal to 90% of their needs   MONITOR:   PO intake, Supplement acceptance, Weight trends, Labs  REASON FOR ASSESSMENT:   Consult Assessment of nutrition requirement/status  ASSESSMENT:  Patient is a 73 yo female with a history of COPD, GERD, Hypothyroidism, Hypertension and anemia. She presents with shortness of breath- acute on chronic respiratory failure with hypoxemia. Home O2 @ 2 liters. Patient had a vomiting episode yesterday but no further problem with emesis.  Appetite is very good today. Patient diet advanced fully for lunch and she has consumed at least 75% of meal and is still eating. Feeds herself. Patient says home diet is regular. Children live with her and she has raised her 72 yo Curator from baby. Patient says she help "keep me on my toes". They eat 3 meals daily usually but based on pt report very few vegetables. However fruit is frequently consumed. Breakfast is often coffee with blueberry muffin or other pastry. Lunch is a sub or other sandwich and sometimes pizza. Dinner varies.   Patient has a stable weight history for > 1 year she has ranged between 48-49 kg. Prior to that she weighed 52-54 kg. Patient says I have never been a big person and self reports usual weight 110 lb.  Medications reviewed and include: levothyroxine, pantoprazole, Zithromax and rocephin.  Labs: BMP Latest Ref Rng & Units 11/07/2018 09/25/2018 06/22/2018  Glucose 70 - 99 mg/dL 200(H) 127(H) 241(H)  BUN 8  - 23 mg/dL 13 14 26(H)  Creatinine 0.44 - 1.00 mg/dL 0.79 0.83 0.73  Sodium 135 - 145 mmol/L 143 141 139  Potassium 3.5 - 5.1 mmol/L 3.1(L) 3.3(L) 3.8  Chloride 98 - 111 mmol/L 94(L) 94(L) 106  CO2 22 - 32 mmol/L 35(H) 34(H) 25  Calcium 8.9 - 10.3 mg/dL 9.6 9.8 8.3(L)     NUTRITION - FOCUSED PHYSICAL EXAM:  Unable to complete Nutrition-Focused physical exam at this time.  Minimal fat reserves based on chronic underweight status. Ambulates independently in the community per pt.    Diet Order:   Diet Order            Diet regular Room service appropriate? Yes; Fluid consistency: Thin  Diet effective now              EDUCATION NEEDS:   Skin:  Skin Assessment: Reviewed RN Assessment  Last BM:  unknown  Height:   Ht Readings from Last 1 Encounters:  11/07/18 5\' 5"  (1.651 m)    Weight:   Wt Readings from Last 1 Encounters:  11/06/18 49.4 kg    Ideal Body Weight:  56.8 kg  BMI:  Body mass index is 18.14 kg/m.  Estimated Nutritional Needs:   Kcal:  5809-9833 (30-34 kcal/kg/bw)  Protein:  64-74 (1.3-1.5 gr/kg/bw)  Fluid:  1500 ml daily  Colman Cater MS,RD,CSG,LDN Office: [phone] Pager: [phone]

## 2018-11-07 NOTE — ED Notes (Signed)
Pt ambulated with 2 lpm oxygen and o2 level continued to drop, oxygen increased to 4 and continued to drop. Pt states she felt weak and dizzy. When pt got back to room she was in upper 60s on 6lpm and was placed back on NRB.

## 2018-11-07 NOTE — TOC Initial Note (Signed)
Transition of Care [facility]) - Initial/Assessment Note    Patient Details  Name: Marilyn Solomon MRN: [id number] Date of Birth: 12-18-1945  [facility] CM/SW Contact:    Boneta Lucks, RN Phone Number: 11/07/2018, 1:40 PM  Clinical Narrative:    Admitted in the unit, observation for COPD. Patient lives at home with adult children and grand daughter. Patient drives, but car is having repairs done, her sister will provide transportation for her as needed. Case Management consult for SNF, patient refused, Discussed home health, patient states she will think about it. Patient is active with [facility] and speaks to them often. TOC will follow.               Expected Discharge Plan: Home/Self Care     Patient Goals and CMS Choice Patient states their goals for this hospitalization and ongoing recovery are:: to go home.      Expected Discharge Plan and Services Expected Discharge Plan: Home/Self Care       Living arrangements for the past 2 months: Single Family Home Expected Discharge Date: 11/10/18                                    Prior Living Arrangements/Services Living arrangements for the past 2 months: Single Family Home Lives with:: Self, Adult Children, Relatives   Do you feel safe going back to the place where you live?: Yes      Need for Family Participation in Patient Care: Yes (Comment) Care giver support system in place?: Yes (comment)   Criminal Activity/Legal Involvement Pertinent to Current Situation/Hospitalization: No - Comment as needed  Activities of Daily Living Home Assistive Devices/Equipment: Oxygen, Nebulizer, Walker (specify type) ADL Screening (condition at time of admission) Patient's cognitive ability adequate to safely complete daily activities?: Yes Is the patient deaf or have difficulty hearing?: No Does the patient have difficulty seeing, even when wearing glasses/contacts?: No Does the patient have difficulty concentrating,  remembering, or making decisions?: No Patient able to express need for assistance with ADLs?: Yes Does the patient have difficulty dressing or bathing?: No Independently performs ADLs?: Yes (appropriate for developmental age) Does the patient have difficulty walking or climbing stairs?: Yes Weakness of Legs: None Weakness of Arms/Hands: None  Permission Sought/Granted Permission sought to share information with : Case Manager                Emotional Assessment Appearance:: Appears stated age     Orientation: : Oriented to Self, Oriented to Place, Oriented to  Time, Oriented to Situation Alcohol / Substance Use: Not Applicable Psych Involvement: No (comment)  Admission diagnosis:  Hypoxia [R09.02] COPD exacerbation ([place]) [J44.1] Patient Active Problem List   Diagnosis Date Noted  . Hypokalemia 11/07/2018  . Hyperglycemia 11/07/2018  . Acute on chronic respiratory failure with hypoxemia ([place]) 06/21/2018  . COPD exacerbation ([place]) 06/02/2018  . Anemia 06/02/2018  . Hypertension 06/02/2018  . COPD with acute exacerbation ([place]) 01/31/2018  . Tachycardia 01/31/2018  . Hypoxia 01/31/2018  . Leukocytosis 01/31/2018  . Positive colorectal cancer screening using Cologuard test 08/21/2017  . Normocytic anemia   . Anxiety 06/30/2017  . Hypothyroidism 06/30/2017  . GERD (gastroesophageal reflux disease) 06/30/2017  . COPD (chronic obstructive pulmonary disease) ([place]) 06/30/2017  . Elevated liver enzymes 06/30/2017  . Severe Iron deficiency anemia 06/30/2017  . HCAP (healthcare-associated pneumonia) 06/28/2017  . CAP (community acquired pneumonia) 06/28/2017  .  Second degree hemorrhoids   . Encounter for screening colonoscopy 10/28/2015   PCP:  Lucianne Lei, MD Pharmacy:   [facility], [address] [place] [address] Phone: [phone] Fax: [phone]         Readmission Risk Interventions No flowsheet data  found.

## 2018-11-07 NOTE — Progress Notes (Signed)
COVERAGE NOTE   11/07/2018 1:49 PM  Sherina Caryn Section was seen and examined.  The H&P by the admitting provider, orders, imaging was reviewed.  Please see new orders.  Will continue to follow.   Vitals:   11/07/18 0829 11/07/18 1104  BP:    Pulse:  86  Resp:  (!) 21  Temp:  98.5 F (36.9 C)  SpO2: 100% 99%    Results for orders placed or performed during the hospital encounter of 11/06/18  SARS Coronavirus 2 (CEPHEID- Performed in [place] hospital lab), [facility] Order   Specimen: Nasopharyngeal Swab  Result Value Ref Range   SARS Coronavirus 2 NEGATIVE NEGATIVE  CBC with Differential  Result Value Ref Range   WBC 19.6 (H) 4.0 - 10.5 K/uL   RBC 3.88 3.87 - 5.11 MIL/uL   Hemoglobin 10.6 (L) 12.0 - 15.0 g/dL   HCT 35.4 (L) 36.0 - 46.0 %   MCV 91.2 80.0 - 100.0 fL   MCH 27.3 26.0 - 34.0 pg   MCHC 29.9 (L) 30.0 - 36.0 g/dL   RDW 14.0 11.5 - 15.5 %   Platelets 263 150 - 400 K/uL   nRBC 0.0 0.0 - 0.2 %   Neutrophils Relative % 90 %   Neutro Abs 17.6 (H) 1.7 - 7.7 K/uL   Lymphocytes Relative 5 %   Lymphs Abs 0.9 0.7 - 4.0 K/uL   Monocytes Relative 3 %   Monocytes Absolute 0.7 0.1 - 1.0 K/uL   Eosinophils Relative 1 %   Eosinophils Absolute 0.2 0.0 - 0.5 K/uL   Basophils Relative 0 %   Basophils Absolute 0.0 0.0 - 0.1 K/uL   Immature Granulocytes 1 %   Abs Immature Granulocytes 0.09 (H) 0.00 - 0.07 K/uL  Basic metabolic panel  Result Value Ref Range   Sodium 143 135 - 145 mmol/L   Potassium 3.1 (L) 3.5 - 5.1 mmol/L   Chloride 94 (L) 98 - 111 mmol/L   CO2 35 (H) 22 - 32 mmol/L   Glucose, Bld 200 (H) 70 - 99 mg/dL   BUN 13 8 - 23 mg/dL   Creatinine, Ser 0.79 0.44 - 1.00 mg/dL   Calcium 9.6 8.9 - 10.3 mg/dL   GFR calc non Af Amer >60 >60 mL/min   GFR calc Af Amer >60 >60 mL/min   Anion gap 14 5 - 15  Glucose, capillary  Result Value Ref Range   Glucose-Capillary 160 (H) 70 - 99 mg/dL  Hemoglobin A1c  Result Value Ref Range   Hgb A1c MFr Bld 5.6 4.8 - 5.6 %   Mean  Plasma Glucose 114.02 mg/dL  Glucose, capillary  Result Value Ref Range   Glucose-Capillary 120 (H) 70 - 99 mg/dL     Murvin Natal, MD [facility]   11/06/2018 11:14 PM How to contact the [facility] Attending or Consulting provider 7A - 7P or covering provider during after hours 7P -7A, for this patient?  1. Check the care team in [facility] and look for a) attending/consulting [facility] provider listed and b) the [facility] team listed 2. Log into [email or web address] and use [place]'s universal password to access. If you do not have the password, please contact the hospital operator. 3. Locate the [facility] provider you are looking for under [facility] and page to a number that you can be directly reached. 4. If you still have difficulty reaching the provider, please page the [facility] (Director on Call) for the Hospitalists listed on amion for assistance.

## 2018-11-08 DIAGNOSIS — R739 Hyperglycemia, unspecified: Secondary | ICD-10-CM

## 2018-11-08 DIAGNOSIS — E039 Hypothyroidism, unspecified: Secondary | ICD-10-CM | POA: Diagnosis not present

## 2018-11-08 DIAGNOSIS — E876 Hypokalemia: Secondary | ICD-10-CM

## 2018-11-08 DIAGNOSIS — D649 Anemia, unspecified: Secondary | ICD-10-CM

## 2018-11-08 DIAGNOSIS — D72829 Elevated white blood cell count, unspecified: Secondary | ICD-10-CM

## 2018-11-08 DIAGNOSIS — J441 Chronic obstructive pulmonary disease with (acute) exacerbation: Secondary | ICD-10-CM | POA: Diagnosis not present

## 2018-11-08 LAB — COMPREHENSIVE METABOLIC PANEL
ALT: 15 U/L (ref 0–44)
AST: 24 U/L (ref 15–41)
Albumin: 3.2 g/dL — ABNORMAL LOW (ref 3.5–5.0)
Alkaline Phosphatase: 45 U/L (ref 38–126)
Anion gap: 12 (ref 5–15)
BUN: 35 mg/dL — ABNORMAL HIGH (ref 8–23)
CO2: 32 mmol/L (ref 22–32)
Calcium: 9.5 mg/dL (ref 8.9–10.3)
Chloride: 95 mmol/L — ABNORMAL LOW (ref 98–111)
Creatinine, Ser: 1.37 mg/dL — ABNORMAL HIGH (ref 0.44–1.00)
GFR calc Af Amer: 45 mL/min — ABNORMAL LOW (ref >60)
GFR calc non Af Amer: 38 mL/min — ABNORMAL LOW (ref >60)
Glucose, Bld: 179 mg/dL — ABNORMAL HIGH (ref 70–99)
Potassium: 4.3 mmol/L (ref 3.5–5.1)
Sodium: 139 mmol/L (ref 135–145)
Total Bilirubin: 0.4 mg/dL (ref 0.3–1.2)
Total Protein: 6.2 g/dL — ABNORMAL LOW (ref 6.5–8.1)

## 2018-11-08 LAB — CBC WITH DIFFERENTIAL/PLATELET
Abs Immature Granulocytes: 0.12 10*3/uL — ABNORMAL HIGH (ref 0.00–0.07)
Basophils Absolute: 0 10*3/uL (ref 0.0–0.1)
Basophils Relative: 0 %
Eosinophils Absolute: 0 10*3/uL (ref 0.0–0.5)
Eosinophils Relative: 0 %
HCT: 32.7 % — ABNORMAL LOW (ref 36.0–46.0)
Hemoglobin: 9.9 g/dL — ABNORMAL LOW (ref 12.0–15.0)
Immature Granulocytes: 1 %
Lymphocytes Relative: 3 %
Lymphs Abs: 0.5 10*3/uL — ABNORMAL LOW (ref 0.7–4.0)
MCH: 27.4 pg (ref 26.0–34.0)
MCHC: 30.3 g/dL (ref 30.0–36.0)
MCV: 90.6 fL (ref 80.0–100.0)
Monocytes Absolute: 0.5 10*3/uL (ref 0.1–1.0)
Monocytes Relative: 3 %
Neutro Abs: 17.3 10*3/uL — ABNORMAL HIGH (ref 1.7–7.7)
Neutrophils Relative %: 93 %
Platelets: 229 10*3/uL (ref 150–400)
RBC: 3.61 MIL/uL — ABNORMAL LOW (ref 3.87–5.11)
RDW: 14.3 % (ref 11.5–15.5)
WBC: 18.4 10*3/uL — ABNORMAL HIGH (ref 4.0–10.5)
nRBC: 0 % (ref 0.0–0.2)

## 2018-11-08 LAB — GLUCOSE, CAPILLARY
Glucose-Capillary: 132 mg/dL — ABNORMAL HIGH (ref 70–99)
Glucose-Capillary: 174 mg/dL — ABNORMAL HIGH (ref 70–99)

## 2018-11-08 LAB — HIV ANTIBODY (ROUTINE TESTING W REFLEX): HIV Screen 4th Generation wRfx: NONREACTIVE

## 2018-11-08 MED ORDER — PREDNISONE 20 MG PO TABS: ORAL_TABLET | ORAL | 0 refills | Status: DC

## 2018-11-08 MED ORDER — DOXYCYCLINE HYCLATE 100 MG PO CAPS: 100.0000 mg | ORAL_CAPSULE | Freq: Two times a day (BID) | ORAL | 0 refills | Status: AC

## 2018-11-08 NOTE — Discharge Instructions (Signed)
COPD and Physical Activity °Chronic obstructive pulmonary disease (COPD) is a long-term (chronic) condition that affects the lungs. COPD is a general term that can be used to describe many different lung problems that cause lung swelling (inflammation) and limit airflow, including chronic bronchitis and emphysema. °The main symptom of COPD is shortness of breath, which makes it harder to do even simple tasks. This can also make it harder to exercise and be active. Talk with your health care provider about treatments to help you breathe better and actions you can take to prevent breathing problems during physical activity. °What are the benefits of exercising with COPD? °Exercising regularly is an important part of a healthy lifestyle. You can still exercise and do physical activities even though you have COPD. Exercise and physical activity improve your shortness of breath by increasing blood flow (circulation). This causes your heart to pump more oxygen through your body. Moderate exercise can improve your: °· Oxygen use. °· Energy level. °· Shortness of breath. °· Strength in your breathing muscles. °· Heart health. °· Sleep. °· Self-esteem and feelings of self-worth. °· Depression, stress, and anxiety levels. °Exercise can benefit everyone with COPD. The severity of your disease may affect how hard you can exercise, especially at first, but everyone can benefit. Talk with your health care provider about how much exercise is safe for you, and which activities and exercises are safe for you. °What actions can I take to prevent breathing problems during physical activity? °· Sign up for a pulmonary rehabilitation program. This type of program may include: °? Education about lung diseases. °? Exercise classes that teach you how to exercise and be more active while improving your breathing. This usually involves: °§ Exercise using your lower extremities, such as a stationary bicycle. °§ About 30 minutes of exercise, 2  to 5 times per week, for 6 to 12 weeks °§ Strength training, such as push ups or leg lifts. °? Nutrition education. °? Group classes in which you can talk with others who also have COPD and learn ways to manage stress. °· If you use an oxygen tank, you should use it while you exercise. Work with your health care provider to adjust your oxygen for your physical activity. Your resting flow rate is different from your flow rate during physical activity. °· While you are exercising: °? Take slow breaths. °? Pace yourself and do not try to go too fast. °? Purse your lips while breathing out. Pursing your lips is similar to a kissing or whistling position. °? If doing exercise that uses a quick burst of effort, such as weight lifting: °§ Breathe in before starting the exercise. °§ Breathe out during the hardest part of the exercise (such as raising the weights). °Where to find support °You can find support for exercising with COPD from: °· Your health care provider. °· A pulmonary rehabilitation program. °· Your local health department or community health programs. °· Support groups, online or in-person. Your health care provider may be able to recommend support groups. °Where to find more information °You can find more information about exercising with COPD from: °· American Lung Association: lung.org. °· COPD Foundation: copdfoundation.org. °Contact a health care provider if: °· Your symptoms get worse. °· You have chest pain. °· You have nausea. °· You have a fever. °· You have trouble talking or catching your breath. °· You want to start a new exercise program or a new activity. °Summary °· COPD is a general term that can   be used to describe many different lung problems that cause lung swelling (inflammation) and limit airflow. This includes chronic bronchitis and emphysema.  Exercise and physical activity improve your shortness of breath by increasing blood flow (circulation). This causes your heart to provide more  oxygen to your body.  Contact your health care provider before starting any exercise program or new activity. Ask your health care provider what exercises and activities are safe for you. This information is not intended to replace advice given to you by your health care provider. Make sure you discuss any questions you have with your health care provider. Document Released: 06/07/2017 Document Revised: 06/07/2017 Document Reviewed: 06/07/2017 Elsevier Interactive Patient Education  2019 [employer]   Chronic Obstructive Pulmonary Disease Exacerbation Chronic obstructive pulmonary disease (COPD) is a long-term (chronic) lung problem. In COPD, the flow of air from the lungs is limited. COPD exacerbations are times that breathing gets worse and you need more than your normal treatment. Without treatment, they can be life threatening. If they happen often, your lungs can become more damaged. If your COPD gets worse, your doctor may treat you with:  Medicines.  Oxygen.  Different ways to clear your airway, such as using a mask. Follow these instructions at home: Medicines  Take over-the-counter and prescription medicines only as told by your doctor.  If you take an antibiotic or steroid medicine, do not stop taking the medicine even if you start to feel better.  Keep up with shots (vaccinations) as told by your doctor. Be sure to get a yearly (annual) flu shot. Lifestyle  Do not smoke. If you need help quitting, ask your doctor.  Eat healthy foods.  Exercise regularly.  Get plenty of sleep.  Avoid tobacco smoke and other things that can bother your lungs.  Wash your hands often with soap and water. This will help keep you from getting an infection. If you cannot use soap and water, use hand sanitizer.  During flu season, avoid areas that are crowded with people. General instructions  Drink enough fluid to keep your pee (urine) clear or pale yellow. Do not do this if your  doctor has told you not to.  Use a cool mist machine (vaporizer).  If you use oxygen or a machine that turns medicine into a mist (nebulizer), continue to use it as told.  Follow all instructions for rehabilitation. These are steps you can take to make your body work better.  Keep all follow-up visits as told by your doctor. This is important. Contact a doctor if:  Your COPD symptoms get worse than normal. Get help right away if:  You are short of breath and it gets worse.  You have trouble talking.  You have chest pain.  You cough up blood.  You have a fever.  You keep throwing up (vomiting).  You feel weak or you pass out (faint).  You feel confused.  You are not able to sleep because of your symptoms.  You are not able to do daily activities. Summary  COPD exacerbations are times that breathing gets worse and you need more treatment than normal.  COPD exacerbations can be very serious and may cause your lungs to become more damaged.  Do not smoke. If you need help quitting, ask your doctor.  Stay up-to-date on your shots. Get a flu shot every year. This information is not intended to replace advice given to you by your health care provider. Make sure you discuss any questions you  have with your health care provider. Document Released: 05/04/2011 Document Revised: 06/19/2016 Document Reviewed: 06/19/2016 Elsevier Interactive Patient Education  2019 [employer]   Cough, Adult  A cough helps to clear your throat and lungs. A cough may last only 2-3 weeks (acute), or it may last longer than 8 weeks (chronic). Many different things can cause a cough. A cough may be a sign of an illness or another medical condition. Follow these instructions at home:  Pay attention to any changes in your cough.  Take medicines only as told by your doctor. ? If you were prescribed an antibiotic medicine, take it as told by your doctor. Do not stop taking it even if you start to  feel better. ? Talk with your doctor before you try using a cough medicine.  Drink enough fluid to keep your pee (urine) clear or pale yellow.  If the air is dry, use a cold steam vaporizer or humidifier in your home.  Stay away from things that make you cough at work or at home.  If your cough is worse at night, try using extra pillows to raise your head up higher while you sleep.  Do not smoke, and try not to be around smoke. If you need help quitting, ask your doctor.  Do not have caffeine.  Do not drink alcohol.  Rest as needed. Contact a doctor if:  You have new problems (symptoms).  You cough up yellow fluid (pus).  Your cough does not get better after 2-3 weeks, or your cough gets worse.  Medicine does not help your cough and you are not sleeping well.  You have pain that gets worse or pain that is not helped with medicine.  You have a fever.  You are losing weight and you do not know why.  You have night sweats. Get help right away if:  You cough up blood.  You have trouble breathing.  Your heartbeat is very fast. This information is not intended to replace advice given to you by your health care provider. Make sure you discuss any questions you have with your health care provider. Document Released: 01/26/2011 Document Revised: 10/21/2015 Document Reviewed: 07/22/2014 Elsevier Interactive Patient Education  2019 [employer]   IMPORTANT INFORMATION: PAY CLOSE ATTENTION   PHYSICIAN DISCHARGE INSTRUCTIONS  Follow with Primary care provider  Lucianne Lei, MD  and other consultants as instructed by your Hospitalist Physician  [place] IF SYMPTOMS COME BACK, WORSEN OR NEW PROBLEM DEVELOPS   Please note: You were cared for by a hospitalist during your hospital stay. Every effort will be made to forward records to your primary care provider.  You can request that your primary care provider send for your hospital records  if they have not received them.  Once you are discharged, your primary care physician will handle any further medical issues. Please note that NO REFILLS for any discharge medications will be authorized once you are discharged, as it is imperative that you return to your primary care physician (or establish a relationship with a primary care physician if you do not have one) for your post hospital discharge needs so that they can reassess your need for medications and monitor your lab values.  Please get a complete blood count and chemistry panel checked by your Primary MD at your next visit, and again as instructed by your Primary MD.  Get Medicines reviewed and adjusted: Please take all your medications with you for  your next visit with your Primary MD  Laboratory/radiological data: Please request your Primary MD to go over all hospital tests and procedure/radiological results at the follow up, please ask your primary care provider to get all Hospital records sent to his/her office.  In some cases, they will be blood work, cultures and biopsy results pending at the time of your discharge. Please request that your primary care provider follow up on these results.  If you are diabetic, please bring your blood sugar readings with you to your follow up appointment with primary care.    Please call and make your follow up appointments as soon as possible.    Also Note the following: If you experience worsening of your admission symptoms, develop shortness of breath, life threatening emergency, suicidal or homicidal thoughts you must seek medical attention immediately by calling 911 or calling your MD immediately  if symptoms less severe.  You must read complete instructions/literature along with all the possible adverse reactions/side effects for all the Medicines you take and that have been prescribed to you. Take any new Medicines after you have completely understood and accpet all the possible  adverse reactions/side effects.   Do not drive when taking Pain medications or sleeping medications (Benzodiazepines)  Do not take more than prescribed Pain, Sleep and Anxiety Medications. It is not advisable to combine anxiety,sleep and pain medications without talking with your primary care practitioner  Special Instructions: If you have smoked or chewed Tobacco  in the last 2 yrs please stop smoking, stop any regular Alcohol  and or any Recreational drug use.  Wear Seat belts while driving.  Do not drive if taking any narcotic, mind altering or controlled substances or recreational drugs or alcohol.

## 2018-11-08 NOTE — Discharge Summary (Signed)
Physician Discharge Summary  Marilyn Solomon HLK:[id number] DOB: 1945-10-30 DOA: 11/06/2018  PCP: Lucianne Lei, MD  Admit date: 11/06/2018 Discharge date: 11/08/2018  Admitted From: Home Disposition: Home  Recommendations for Outpatient Follow-up:  1. Follow up with PCP in 1 weeks 2. Establish care with pulmonology in 2 to 3 weeks for chronic lung disease   Home Health: RN PT social work  Discharge Condition: Stable CODE STATUS: Full  Brief Hospitalization Summary: Please see all hospital notes, images, labs for full details of the hospitalization. HPI: Marilyn Solomon is a 73 y.o. female with medical history significant of anemia, anxiety, osteoarthritis, chronic back pain, COPD, GERD, hypertension, hypothyroidism who is coming to the emergency department due to progressively worse dyspnea since about 1900 yesterday evening that has not responded to 5 different breathing treatments at home.  No exposure to allergens or fumes.  No travel history.  She denies fever, chills, sore throat, renal area or hemoptysis.  No chest pain, palpitations, dizziness, diaphoresis, PND, orthopnea or pitting edema of the lower extremities.  She denies abdominal pain, nausea or vomiting, diarrhea, constipation, melena or hematochezia.  No dysuria, frequency hematuria.  Denies polyuria, polydipsia, polyphagia or blurred vision.    ED Course: She was given 125 mg of Solu-Medrol by EMS.  Initial vital signs temperature 98.3 F, pulse 113, respirations 22, blood pressure 169/82 mmHg and O2 sat 100% on room air.  She was given developmental oxygen, bronchodilators and magnesium sulfate.  A trial of ambulation resulted in the patient becoming severely hypoxic in the 70s and needing NRB mask oxygen.  His white count was 19.6 with 90% neutrophils, 5% lymphocytes and 3% monocytes.  Hemoglobin 10.6 g/dL and platelets 263.  BMP shows a potassium of 3.1, chloride 94 CO2 of 35 glucose of 200 mg/dL. Chest radiograph  shows chronic lung disease with chronic nodular bilateral pulmonary interstitial opacities.  The patient was admitted for mild acute COPD exacerbation and received IV steroids and scheduled bronchodilators.  She responded well to this therapy.  She is breathing much better and she was started on antibiotics as well.  She reports today that she feels better and she can manage at home.  She reports that she feels that she is at her baseline breathing status.  She would like to follow-up with the pulmonologist and establish outpatient care for follow-up and requested Dr. Luan Pulling as she lives in this [place] area.  I explained to her that he will be retiring in September but there should be further pulmonary coverage after that through the practice.  The patient verbalized understanding.  The patient is going to follow-up with her primary care provider early next week for recheck and make an appointment to establish care with Dr. Luan Pulling practice for outpatient follow-up of chronic lung disease.  She will be discharged on oral doxycycline and a prednisone taper.  Her hypokalemia has been repleted.  Follow-up with primary care physician for recheck.  Discharge Diagnoses:  Principal Problem:   COPD exacerbation ([place]) Active Problems:   Hypothyroidism   Normocytic anemia   Leukocytosis   Anemia   Hypertension   Hypokalemia   Hyperglycemia   Discharge Instructions: Discharge Instructions    Call MD for:  difficulty breathing, headache or visual disturbances   Complete by: As directed    Call MD for:  extreme fatigue   Complete by: As directed    Call MD for:  persistant dizziness or light-headedness   Complete by: As directed  Increase activity slowly   Complete by: As directed      Allergies as of 11/08/2018      Reactions   Penicillins Itching   Has patient had a PCN reaction causing immediate rash, facial/tongue/throat swelling, SOB or lightheadedness with hypotension: no Has patient  had a PCN reaction causing severe rash involving mucus membranes or skin necrosis: No Has patient had a PCN reaction that required hospitalization No Has patient had a PCN reaction occurring within the last 10 years: No If all of the above answers are "NO", then may proceed with Cephalosporin use.   Vicodin [hydrocodone-acetaminophen] Nausea And Vomiting      Medication List    STOP taking these medications   feeding supplement (ENSURE ENLIVE) Liqd   ferrous sulfate 325 (65 FE) MG tablet   lisinopril-hydrochlorothiazide 10-12.5 MG tablet Commonly known as: ZESTORETIC     TAKE these medications   ALPRAZolam 1 MG tablet Commonly known as: XANAX Take 1 mg by mouth every 8 (eight) hours as needed for anxiety.   benzonatate 200 MG capsule Commonly known as: TESSALON Take 1 capsule (200 mg total) by mouth 3 (three) times daily as needed (Excessive coughing spells).   doxycycline 100 MG capsule Commonly known as: VIBRAMYCIN Take 1 capsule (100 mg total) by mouth 2 (two) times daily for 7 days.   Fluticasone-Salmeterol 250-50 MCG/DOSE Aepb Commonly known as: Advair Diskus Inhale 1 puff into the lungs 2 (two) times daily.   ipratropium-albuterol 0.5-2.5 (3) MG/3ML Soln Commonly known as: DUONEB Take 3 mLs by nebulization every 6 (six) hours as needed (shortness of breath or wheezing).   levothyroxine 50 MCG tablet Commonly known as: SYNTHROID Take 1 tablet by mouth daily.   oxyCODONE 15 MG immediate release tablet Commonly known as: ROXICODONE Take 15 mg by mouth 3 (three) times daily as needed for pain.   pantoprazole 40 MG tablet Commonly known as: PROTONIX TAKE 1 TABLET BY MOUTH DAILY. What changed: how much to take   potassium chloride 10 MEQ tablet Commonly known as: K-DUR Take 1 tablet by mouth daily.   predniSONE 20 MG tablet Commonly known as: DELTASONE Take 2 daily QAM x 5 days, then 1 QAM x 5 days What changed:   additional instructions  Another  medication with the same name was removed. Continue taking this medication, and follow the directions you see here.      Follow-up Information    Lucianne Lei, MD. Schedule an appointment as soon as possible for a visit in 5 day(s).   Specialty: Family Medicine Why: Hospital Follow Up  Contact information: [address] [place] [address] [phone]        Sinda Du, MD. Schedule an appointment as soon as possible for a visit in 2 week(s).   Specialty: Pulmonary Disease Why: Establish care for chronic lung disease Contact information: [place] [address] [phone]          Allergies  Allergen Reactions  . Penicillins Itching    Has patient had a PCN reaction causing immediate rash, facial/tongue/throat swelling, SOB or lightheadedness with hypotension: no Has patient had a PCN reaction causing severe rash involving mucus membranes or skin necrosis: No Has patient had a PCN reaction that required hospitalization No Has patient had a PCN reaction occurring within the last 10 years: No If all of the above answers are "NO", then may proceed with Cephalosporin use.   . Vicodin [Hydrocodone-Acetaminophen] Nausea And Vomiting  Allergies as of 11/08/2018      Reactions   Penicillins Itching   Has patient had a PCN reaction causing immediate rash, facial/tongue/throat swelling, SOB or lightheadedness with hypotension: no Has patient had a PCN reaction causing severe rash involving mucus membranes or skin necrosis: No Has patient had a PCN reaction that required hospitalization No Has patient had a PCN reaction occurring within the last 10 years: No If all of the above answers are "NO", then may proceed with Cephalosporin use.   Vicodin [hydrocodone-acetaminophen] Nausea And Vomiting      Medication List    STOP taking these medications   feeding supplement (ENSURE ENLIVE) Liqd   ferrous sulfate 325 (65 FE) MG tablet    lisinopril-hydrochlorothiazide 10-12.5 MG tablet Commonly known as: ZESTORETIC     TAKE these medications   ALPRAZolam 1 MG tablet Commonly known as: XANAX Take 1 mg by mouth every 8 (eight) hours as needed for anxiety.   benzonatate 200 MG capsule Commonly known as: TESSALON Take 1 capsule (200 mg total) by mouth 3 (three) times daily as needed (Excessive coughing spells).   doxycycline 100 MG capsule Commonly known as: VIBRAMYCIN Take 1 capsule (100 mg total) by mouth 2 (two) times daily for 7 days.   Fluticasone-Salmeterol 250-50 MCG/DOSE Aepb Commonly known as: Advair Diskus Inhale 1 puff into the lungs 2 (two) times daily.   ipratropium-albuterol 0.5-2.5 (3) MG/3ML Soln Commonly known as: DUONEB Take 3 mLs by nebulization every 6 (six) hours as needed (shortness of breath or wheezing).   levothyroxine 50 MCG tablet Commonly known as: SYNTHROID Take 1 tablet by mouth daily.   oxyCODONE 15 MG immediate release tablet Commonly known as: ROXICODONE Take 15 mg by mouth 3 (three) times daily as needed for pain.   pantoprazole 40 MG tablet Commonly known as: PROTONIX TAKE 1 TABLET BY MOUTH DAILY. What changed: how much to take   potassium chloride 10 MEQ tablet Commonly known as: K-DUR Take 1 tablet by mouth daily.   predniSONE 20 MG tablet Commonly known as: DELTASONE Take 2 daily QAM x 5 days, then 1 QAM x 5 days What changed:   additional instructions  Another medication with the same name was removed. Continue taking this medication, and follow the directions you see here.       Procedures/Studies: Dg Chest Portable 1 View  Result Date: 11/06/2018 CLINICAL DATA:  73 year old female with shortness of breath and cough. EXAM: PORTABLE CHEST 1 VIEW COMPARISON:  09/25/2018 and earlier. FINDINGS: Portable AP upright view at 2327 hours. Chronic large lung volumes. Chronic nodular bilateral pulmonary interstitial opacity. No pneumothorax, pleural effusion or acute  pulmonary opacity identified. Normal cardiac size and mediastinal contours. Visualized tracheal air column is within normal limits. No acute osseous abnormality identified. IMPRESSION: Chronic lung disease. No superimposed acute findings are identified. Electronically Signed   By: Genevie Ann M.D.   On: 11/06/2018 23:42      Subjective: Patient says she is feeling much better today and she would like to go home.  She says she can manage at home.  Her breathing is back to what she calls her baseline.  She has no chest pain or shortness of breath.  She says that she normally uses oxygen at home and will continue to do so.  Discharge Exam: Vitals:   11/08/18 0900 11/08/18 1000  BP: 126/60 (!) 107/56  Pulse: (!) 107 98  Resp: (!) 22 (!) 21  Temp:    SpO2: 97%  96%   Vitals:   11/08/18 0730 11/08/18 0837 11/08/18 0900 11/08/18 1000  BP:   126/60 (!) 107/56  Pulse:   (!) 107 98  Resp:   (!) 22 (!) 21  Temp: 97.9 F (36.6 C)     TempSrc: Oral     SpO2:  92% 97% 96%  Weight:      Height:        General: Chronically ill-appearing emaciated female, pt is alert, awake, not in acute distress Cardiovascular: RRR, S1/S2 +, no rubs, no gallops Respiratory: Good air movement bilateral, no wheezing, no rhonchi Abdominal: Soft, NT, ND, bowel sounds + Extremities: no edema, no cyanosis   The results of significant diagnostics from this hospitalization (including imaging, microbiology, ancillary and laboratory) are listed below for reference.     Microbiology: Recent Results (from the past 240 hour(s))  SARS Coronavirus 2 (CEPHEID- Performed in [place] hospital lab), Hosp Order     Status: None   Collection Time: 11/06/18 11:11 PM   Specimen: Nasopharyngeal Swab  Result Value Ref Range Status   SARS Coronavirus 2 NEGATIVE NEGATIVE Final    Comment: (NOTE) If result is NEGATIVE SARS-CoV-2 target nucleic acids are NOT DETECTED. The SARS-CoV-2 RNA is generally detectable in upper and lower   respiratory specimens during the acute phase of infection. The lowest  concentration of SARS-CoV-2 viral copies this assay can detect is 250  copies / mL. A negative result does not preclude SARS-CoV-2 infection  and should not be used as the sole basis for treatment or other  patient management decisions.  A negative result may occur with  improper specimen collection / handling, submission of specimen other  than nasopharyngeal swab, presence of viral mutation(s) within the  areas targeted by this assay, and inadequate number of viral copies  (<250 copies / mL). A negative result must be combined with clinical  observations, patient history, and epidemiological information. If result is POSITIVE SARS-CoV-2 target nucleic acids are DETECTED. The SARS-CoV-2 RNA is generally detectable in upper and lower  respiratory specimens dur ing the acute phase of infection.  Positive  results are indicative of active infection with SARS-CoV-2.  Clinical  correlation with patient history and other diagnostic information is  necessary to determine patient infection status.  Positive results do  not rule out bacterial infection or co-infection with other viruses. If result is PRESUMPTIVE POSTIVE SARS-CoV-2 nucleic acids MAY BE PRESENT.   A presumptive positive result was obtained on the submitted specimen  and confirmed on repeat testing.  While 2019 novel coronavirus  (SARS-CoV-2) nucleic acids may be present in the submitted sample  additional confirmatory testing may be necessary for epidemiological  and / or clinical management purposes  to differentiate between  SARS-CoV-2 and other Sarbecovirus currently known to infect humans.  If clinically indicated additional testing with an alternate test  methodology [phone]) is advised. The SARS-CoV-2 RNA is generally  detectable in upper and lower respiratory sp ecimens during the acute  phase of infection. The expected result is Negative. Fact  Sheet for Patients:  StrictlyIdeas.no Fact Sheet for Healthcare Providers: BankingDealers.co.za This test is not yet approved or cleared by the [place] FDA and has been authorized for detection and/or diagnosis of SARS-CoV-2 by FDA under an Emergency Use Authorization (EUA).  This EUA will remain in effect (meaning this test can be used) for the duration of the COVID-19 declaration under Section 564(b)(1) of the Act, 21 U.S.C. section 360bbb-3(b)(1), unless the authorization is  terminated or revoked sooner. Performed at [facility], [address], [place], [place] [address]      Labs: BNP (last 3 results) Recent Labs    06/02/18 1731  BNP 93.8   Basic Metabolic Panel: Recent Labs  Lab 11/07/18 0023 11/08/18 0459  NA 143 139  K 3.1* 4.3  CL 94* 95*  CO2 35* 32  GLUCOSE 200* 179*  BUN 13 35*  CREATININE 0.79 1.37*  CALCIUM 9.6 9.5   Liver Function Tests: Recent Labs  Lab 11/08/18 0459  AST 24  ALT 15  ALKPHOS 45  BILITOT 0.4  PROT 6.2*  ALBUMIN 3.2*   No results for input(s): LIPASE, AMYLASE in the last 168 hours. No results for input(s): AMMONIA in the last 168 hours. CBC: Recent Labs  Lab 11/07/18 0023 11/08/18 0459  WBC 19.6* 18.4*  NEUTROABS 17.6* 17.3*  HGB 10.6* 9.9*  HCT 35.4* 32.7*  MCV 91.2 90.6  PLT 263 229   Cardiac Enzymes: No results for input(s): CKTOTAL, CKMB, CKMBINDEX, TROPONINI in the last 168 hours. BNP: Invalid input(s): POCBNP CBG: Recent Labs  Lab 11/07/18 0712 11/07/18 1102 11/07/18 1608 11/07/18 2114 11/08/18 0732  GLUCAP 160* 120* 174* 164* 132*   D-Dimer No results for input(s): DDIMER in the last 72 hours. Hgb A1c Recent Labs    11/07/18 0920  HGBA1C 5.6   Lipid Profile No results for input(s): CHOL, HDL, LDLCALC, TRIG, CHOLHDL, LDLDIRECT in the last 72 hours. Thyroid function studies No results for input(s): TSH, T4TOTAL, T3FREE, THYROIDAB in the last 72  hours.  Invalid input(s): FREET3 Anemia work up No results for input(s): VITAMINB12, FOLATE, FERRITIN, TIBC, IRON, RETICCTPCT in the last 72 hours. Urinalysis    Component Value Date/Time   COLORURINE AMBER (A) 06/21/2018 1450   APPEARANCEUR CLOUDY (A) 06/21/2018 1450   LABSPEC 1.020 06/21/2018 1450   PHURINE 5.0 06/21/2018 1450   GLUCOSEU NEGATIVE 06/21/2018 1450   HGBUR NEGATIVE 06/21/2018 1450   BILIRUBINUR NEGATIVE 06/21/2018 1450   KETONESUR NEGATIVE 06/21/2018 1450   PROTEINUR 30 (A) 06/21/2018 1450   UROBILINOGEN 0.2 03/25/2008 1702   NITRITE NEGATIVE 06/21/2018 1450   LEUKOCYTESUR TRACE (A) 06/21/2018 1450   Sepsis Labs Invalid input(s): PROCALCITONIN,  WBC,  LACTICIDVEN Microbiology Recent Results (from the past 240 hour(s))  SARS Coronavirus 2 (CEPHEID- Performed in [place] hospital lab), Hosp Order     Status: None   Collection Time: 11/06/18 11:11 PM   Specimen: Nasopharyngeal Swab  Result Value Ref Range Status   SARS Coronavirus 2 NEGATIVE NEGATIVE Final    Comment: (NOTE) If result is NEGATIVE SARS-CoV-2 target nucleic acids are NOT DETECTED. The SARS-CoV-2 RNA is generally detectable in upper and lower  respiratory specimens during the acute phase of infection. The lowest  concentration of SARS-CoV-2 viral copies this assay can detect is 250  copies / mL. A negative result does not preclude SARS-CoV-2 infection  and should not be used as the sole basis for treatment or other  patient management decisions.  A negative result may occur with  improper specimen collection / handling, submission of specimen other  than nasopharyngeal swab, presence of viral mutation(s) within the  areas targeted by this assay, and inadequate number of viral copies  (<250 copies / mL). A negative result must be combined with clinical  observations, patient history, and epidemiological information. If result is POSITIVE SARS-CoV-2 target nucleic acids are DETECTED. The  SARS-CoV-2 RNA is generally detectable in upper and lower  respiratory specimens  dur ing the acute phase of infection.  Positive  results are indicative of active infection with SARS-CoV-2.  Clinical  correlation with patient history and other diagnostic information is  necessary to determine patient infection status.  Positive results do  not rule out bacterial infection or co-infection with other viruses. If result is PRESUMPTIVE POSTIVE SARS-CoV-2 nucleic acids MAY BE PRESENT.   A presumptive positive result was obtained on the submitted specimen  and confirmed on repeat testing.  While 2019 novel coronavirus  (SARS-CoV-2) nucleic acids may be present in the submitted sample  additional confirmatory testing may be necessary for epidemiological  and / or clinical management purposes  to differentiate between  SARS-CoV-2 and other Sarbecovirus currently known to infect humans.  If clinically indicated additional testing with an alternate test  methodology [phone]) is advised. The SARS-CoV-2 RNA is generally  detectable in upper and lower respiratory sp ecimens during the acute  phase of infection. The expected result is Negative. Fact Sheet for Patients:  StrictlyIdeas.no Fact Sheet for Healthcare Providers: BankingDealers.co.za This test is not yet approved or cleared by the [place] FDA and has been authorized for detection and/or diagnosis of SARS-CoV-2 by FDA under an Emergency Use Authorization (EUA).  This EUA will remain in effect (meaning this test can be used) for the duration of the COVID-19 declaration under Section 564(b)(1) of the Act, 21 U.S.C. section 360bbb-3(b)(1), unless the authorization is terminated or revoked sooner. Performed at [facility], [address], [place], [place] [address]     Time coordinating discharge:   SIGNED:  Irwin Brakeman, MD  [facility] 11/08/2018, 11:42 AM How to  contact the [facility] Attending or Consulting provider [place] or covering provider during after hours [place], for this patient?  1. Check the care team in [facility] and look for a) attending/consulting TR[facility] provider listed and b) the [facility] team listed 2. Log into [email or web address] and use 's universal password to access. If you do not have the password, please contact the hospital operator. 3. Locate the [facility] provider you are looking for under [facility] and page to a number that you can be directly reached. 4. If you still have difficulty reaching the provider, please page the [facility] (Director on Call) for the Hospitalists listed on amion for assistance.

## 2018-11-08 NOTE — Progress Notes (Signed)
Reviewed d/c instructions, follow up care, and medication changes. Pt denies any questions at this time. Pt's belongings given back at this time, denies anything missing. To short stay entrance for d/c via [place].

## 2018-11-08 NOTE — Care Management Obs Status (Signed)
[facility] NOTIFICATION   Patient Details  Name: Marilyn Solomon MRN: [id number] Date of Birth: [birth year]   Medicare Observation Status Notification Given:  Yes(11-07-2018)    Boneta Lucks, RN 11/08/2018, 10:35 AM

## 2018-11-08 NOTE — Care Management (Signed)
Patient had a previously refused SNF and HH. MD felt the patient needs for a minimum of HH. Patient is concerned with her home not being clean. CM finally got patient to agree to [facility]. Referred to [facility] for RN/ PT/ SW.

## 2018-11-11 DIAGNOSIS — E039 Hypothyroidism, unspecified: Secondary | ICD-10-CM | POA: Diagnosis not present

## 2018-11-11 DIAGNOSIS — Z9981 Dependence on supplemental oxygen: Secondary | ICD-10-CM | POA: Diagnosis not present

## 2018-11-11 DIAGNOSIS — J441 Chronic obstructive pulmonary disease with (acute) exacerbation: Secondary | ICD-10-CM | POA: Diagnosis not present

## 2018-11-11 DIAGNOSIS — I10 Essential (primary) hypertension: Secondary | ICD-10-CM | POA: Diagnosis not present

## 2018-11-12 ENCOUNTER — Other Ambulatory Visit: Payer: Self-pay

## 2018-11-12 NOTE — Patient Outreach (Signed)
[facility]) Care Management  11/12/2018  KATHYANN SPAUGH 1945/07/30 [id number]  Successful outreach with Ms. Wolfrey. She was hospitalized on 11/06/18 for COPD exacerbation. Discharged home on 11/08/18.  Mrs. Hamidi reports feeling well today. Denies worsening symptoms since discharge. Reports using her inhaler immediately after waking each morning. She feels this decreases episodes of shortness of breath during the day. She has a nonproductive cough. Reports oxygen saturations of 95%-98% with O2 at 2L/min. Reports pending appointment with Dr. Criss Rosales on next Tuesday.  Confirmed start of nursing services with [place]. Her activity tolerance has declined but she reports ongoing physical therapy services were not required. She continues to perform ADLs independently.   Discussed changes in care management needs. Ms. Antilla remains agreeable to establishing care with a Pulmonologist. Will attempt to schedule the initial assessment within the next few weeks. Discussed services available through the [facility]. She expressed interest in being referred. Overall, she felt that she was doing well in the home. She did mention changes with transportation. Her vehicle is being repaired and she relies on her sister. Discussed options for  transportation assistance. She agreed to contact RNCM if assistance is needed.  PLAN -Will f/u regarding status of Pulmonology referral. -Will Designer, fashion/clothing. -Will update Ms. Valere Dross later this week.   [place] [phone]

## 2018-11-14 ENCOUNTER — Other Ambulatory Visit: Payer: Self-pay

## 2018-11-14 DIAGNOSIS — J441 Chronic obstructive pulmonary disease with (acute) exacerbation: Secondary | ICD-10-CM | POA: Diagnosis not present

## 2018-11-14 DIAGNOSIS — Z9981 Dependence on supplemental oxygen: Secondary | ICD-10-CM | POA: Diagnosis not present

## 2018-11-14 DIAGNOSIS — I10 Essential (primary) hypertension: Secondary | ICD-10-CM | POA: Diagnosis not present

## 2018-11-14 DIAGNOSIS — E039 Hypothyroidism, unspecified: Secondary | ICD-10-CM | POA: Diagnosis not present

## 2018-11-14 NOTE — Patient Outreach (Signed)
[facility]) Care Management  11/14/2018  Marilyn Solomon 25-May-1946 [id number]  Follow-up regarding Pulmonology referral. Per Dr. Fransico Setters referral manager, clinical notes are being submitted. Anticipating initial Pulmonology visit within the next week.  [name] is aware of the pending appointment. During our previous conversation, she expressed concerns regarding transportation. Agreed to follow-up as soon as possible if her family is not available and transportation assistance is needed.   PLAN -Will follow-up later this month.   [place] [phone]

## 2018-11-15 DIAGNOSIS — I10 Essential (primary) hypertension: Secondary | ICD-10-CM | POA: Diagnosis not present

## 2018-11-15 DIAGNOSIS — E039 Hypothyroidism, unspecified: Secondary | ICD-10-CM | POA: Diagnosis not present

## 2018-11-15 DIAGNOSIS — J441 Chronic obstructive pulmonary disease with (acute) exacerbation: Secondary | ICD-10-CM | POA: Diagnosis not present

## 2018-11-15 DIAGNOSIS — Z9981 Dependence on supplemental oxygen: Secondary | ICD-10-CM | POA: Diagnosis not present

## 2018-11-18 DIAGNOSIS — I10 Essential (primary) hypertension: Secondary | ICD-10-CM | POA: Diagnosis not present

## 2018-11-18 DIAGNOSIS — Z9981 Dependence on supplemental oxygen: Secondary | ICD-10-CM | POA: Diagnosis not present

## 2018-11-18 DIAGNOSIS — E039 Hypothyroidism, unspecified: Secondary | ICD-10-CM | POA: Diagnosis not present

## 2018-11-18 DIAGNOSIS — J441 Chronic obstructive pulmonary disease with (acute) exacerbation: Secondary | ICD-10-CM | POA: Diagnosis not present

## 2018-11-19 DIAGNOSIS — R0602 Shortness of breath: Secondary | ICD-10-CM | POA: Diagnosis not present

## 2018-11-19 DIAGNOSIS — R05 Cough: Secondary | ICD-10-CM | POA: Diagnosis not present

## 2018-11-20 DIAGNOSIS — I10 Essential (primary) hypertension: Secondary | ICD-10-CM | POA: Diagnosis not present

## 2018-11-20 DIAGNOSIS — Z9981 Dependence on supplemental oxygen: Secondary | ICD-10-CM | POA: Diagnosis not present

## 2018-11-20 DIAGNOSIS — J441 Chronic obstructive pulmonary disease with (acute) exacerbation: Secondary | ICD-10-CM | POA: Diagnosis not present

## 2018-11-20 DIAGNOSIS — E039 Hypothyroidism, unspecified: Secondary | ICD-10-CM | POA: Diagnosis not present

## 2018-11-21 DIAGNOSIS — M545 Low back pain: Secondary | ICD-10-CM | POA: Diagnosis not present

## 2018-11-21 DIAGNOSIS — F419 Anxiety disorder, unspecified: Secondary | ICD-10-CM | POA: Diagnosis not present

## 2018-11-21 DIAGNOSIS — J441 Chronic obstructive pulmonary disease with (acute) exacerbation: Secondary | ICD-10-CM | POA: Diagnosis not present

## 2018-11-21 DIAGNOSIS — J9611 Chronic respiratory failure with hypoxia: Secondary | ICD-10-CM | POA: Diagnosis not present

## 2018-11-22 ENCOUNTER — Other Ambulatory Visit: Payer: Self-pay

## 2018-11-22 NOTE — Patient Outreach (Signed)
[facility]) Care Management  11/22/2018  Marilyn Solomon February 11, 1946 [id number]    Follow-up outreach. [name] was able to complete the initial evaluation with Dr. Luan Pulling earlier this week. Reports receiving samples and starting trial dose of Trelegy. Will monitor effectiveness and follow-up with Dr. Luan Pulling next month.  She reports feeling very good today. She continues to experience exertional dyspnea but otherwise reports doing well. Maintaining O2 saturations at 95%-100% with O2 at 2L/min.   PLAN -Will continue routine outreach.  [place] [phone]

## 2018-11-26 DIAGNOSIS — J441 Chronic obstructive pulmonary disease with (acute) exacerbation: Secondary | ICD-10-CM | POA: Diagnosis not present

## 2018-11-27 ENCOUNTER — Ambulatory Visit: Payer: Medicare HMO

## 2018-12-11 DIAGNOSIS — J449 Chronic obstructive pulmonary disease, unspecified: Secondary | ICD-10-CM | POA: Diagnosis not present

## 2018-12-11 DIAGNOSIS — E038 Other specified hypothyroidism: Secondary | ICD-10-CM | POA: Diagnosis not present

## 2018-12-11 DIAGNOSIS — F064 Anxiety disorder due to known physiological condition: Secondary | ICD-10-CM | POA: Diagnosis not present

## 2018-12-11 DIAGNOSIS — I10 Essential (primary) hypertension: Secondary | ICD-10-CM | POA: Diagnosis not present

## 2018-12-13 ENCOUNTER — Other Ambulatory Visit: Payer: Self-pay

## 2018-12-13 NOTE — Patient Outreach (Signed)
[facility]) Care Management  12/13/2018  Marilyn Solomon [birth year] [id number]  Successful outreach with Marilyn Solomon. Her COPD has been well controlled this month. Denies episodes of worsening shortness of breath. She is compliant with medications and treatment recommendations. Reports that the current medication regime is working well. She continues to use home O2 at 2L/min.  New Concern: Reports elevated BP readings during a recent office visit with Dr. Criss Rosales. Reports systolic readings in the 327'M. Denies chest pain, headaches or dizziness. Denies visual disturbances. She will complete frequent BP checks and follow-up with Dr. Criss Rosales in a few weeks.  Marilyn Solomon denies changes in care management needs. She has a pending Pulmonology appointment with Dr. Luan Pulling on 12/25/18. She anticipates family members being available to provide transportation for this appointment. She will call if a referral for transportation assistance is needed.   PLAN -Will continue routine outreach.   [place] [phone]

## 2018-12-16 ENCOUNTER — Ambulatory Visit: Payer: Medicare HMO

## 2018-12-16 ENCOUNTER — Other Ambulatory Visit (HOSPITAL_COMMUNITY): Payer: Self-pay | Admitting: Family Medicine

## 2018-12-16 DIAGNOSIS — Z1231 Encounter for screening mammogram for malignant neoplasm of breast: Secondary | ICD-10-CM

## 2018-12-25 DIAGNOSIS — I10 Essential (primary) hypertension: Secondary | ICD-10-CM | POA: Diagnosis not present

## 2018-12-25 DIAGNOSIS — J9611 Chronic respiratory failure with hypoxia: Secondary | ICD-10-CM | POA: Diagnosis not present

## 2018-12-25 DIAGNOSIS — J301 Allergic rhinitis due to pollen: Secondary | ICD-10-CM | POA: Diagnosis not present

## 2018-12-25 DIAGNOSIS — J441 Chronic obstructive pulmonary disease with (acute) exacerbation: Secondary | ICD-10-CM | POA: Diagnosis not present

## 2018-12-26 DIAGNOSIS — J441 Chronic obstructive pulmonary disease with (acute) exacerbation: Secondary | ICD-10-CM | POA: Diagnosis not present

## 2019-01-01 ENCOUNTER — Ambulatory Visit (HOSPITAL_COMMUNITY)
Admission: RE | Admit: 2019-01-01 | Discharge: 2019-01-01 | Disposition: A | Discharge status: Home / Self Care | Payer: Medicare HMO | Source: Ambulatory Visit | Attending: Family Medicine | Admitting: Family Medicine

## 2019-01-01 ENCOUNTER — Other Ambulatory Visit: Payer: Self-pay

## 2019-01-01 DIAGNOSIS — Z1231 Encounter for screening mammogram for malignant neoplasm of breast: Secondary | ICD-10-CM | POA: Insufficient documentation

## 2019-01-03 ENCOUNTER — Other Ambulatory Visit: Payer: Self-pay

## 2019-01-03 NOTE — Patient Outreach (Signed)
[place] [facility]) Care Management  01/03/2019  Marilyn Solomon 1946-04-21 [id number]     Attempted outreach with [name]. Per family member, she was out of the home at the time of the call. Will attempt outreach on next week.   [place] [phone]

## 2019-01-09 ENCOUNTER — Other Ambulatory Visit: Payer: Self-pay

## 2019-01-09 NOTE — Patient Outreach (Signed)
[place] [facility]) Care Management  01/09/2019  Marilyn Solomon 24-Jul-1945 [id number]    Successful outreach with Marilyn Solomon. She reports doing very well today. Reports attending Pulmonology follow-up with Dr. Luan Pulling on 12/25/18. She reports a productive cough over the past few weeks. Describes the sputum as "clear to white." Denies complaints of shortness of breath at the time of the call. Denies changes in activity tolerance.   Reviewed and updated medications. She reports compliance with medications and treatment recommendations. States she is weaning from continuous home oxygen as recommended by Dr. Luan Pulling. Reports oxygen saturations of 95-97% on room air.  Marilyn Solomon denies urgent concerns or changes in care management needs. Strongly encouraged to continue practicing recommended COVID-19 precautions due to her high risk for complications. Current Outpatient Medications on File Prior to Visit  Medication Sig Dispense Refill  . Fluticasone-Umeclidin-Vilant (TRELEGY ELLIPTA) 100-62.5-25 MCG/INH AEPB Inhale into the lungs.    . ALPRAZolam (XANAX) 1 MG tablet Take 1 mg by mouth every 8 (eight) hours as needed for anxiety.     . benzonatate (TESSALON) 200 MG capsule Take 1 capsule (200 mg total) by mouth 3 (three) times daily as needed (Excessive coughing spells). 30 capsule 0  . Fluticasone-Salmeterol (ADVAIR DISKUS) 250-50 MCG/DOSE AEPB Inhale 1 puff into the lungs 2 (two) times daily. 60 each 3  . ipratropium-albuterol (DUONEB) 0.5-2.5 (3) MG/3ML SOLN Take 3 mLs by nebulization every 6 (six) hours as needed (shortness of breath or wheezing). 360 mL 3  . levothyroxine (SYNTHROID) 50 MCG tablet Take 1 tablet by mouth daily.    Marland Kitchen oxyCODONE (ROXICODONE) 15 MG immediate release tablet Take 15 mg by mouth 3 (three) times daily as needed for pain.     . pantoprazole (PROTONIX) 40 MG tablet TAKE 1 TABLET BY MOUTH DAILY. (Patient taking differently: Take 40 mg by mouth daily. ) 90 tablet  3  . potassium chloride (K-DUR) 10 MEQ tablet Take 1 tablet by mouth daily.    . predniSONE (DELTASONE) 20 MG tablet Take 2 daily QAM x 5 days, then 1 QAM x 5 days (Patient not taking: Reported on 12/13/2018) 15 tablet 0   No current facility-administered medications on file prior to visit.     PLAN -Will continue routine outreach.   [place] [phone]

## 2019-01-22 DIAGNOSIS — Z681 Body mass index (BMI) 19 or less, adult: Secondary | ICD-10-CM | POA: Diagnosis not present

## 2019-01-22 DIAGNOSIS — M4000 Postural kyphosis, site unspecified: Secondary | ICD-10-CM | POA: Diagnosis not present

## 2019-01-22 DIAGNOSIS — I110 Hypertensive heart disease with heart failure: Secondary | ICD-10-CM | POA: Diagnosis not present

## 2019-01-22 DIAGNOSIS — J441 Chronic obstructive pulmonary disease with (acute) exacerbation: Secondary | ICD-10-CM | POA: Diagnosis not present

## 2019-01-22 DIAGNOSIS — E1169 Type 2 diabetes mellitus with other specified complication: Secondary | ICD-10-CM | POA: Diagnosis not present

## 2019-01-22 DIAGNOSIS — E038 Other specified hypothyroidism: Secondary | ICD-10-CM | POA: Diagnosis not present

## 2019-01-23 ENCOUNTER — Other Ambulatory Visit: Payer: Self-pay

## 2019-01-23 ENCOUNTER — Other Ambulatory Visit (HOSPITAL_COMMUNITY): Payer: Self-pay | Admitting: Family Medicine

## 2019-01-23 ENCOUNTER — Ambulatory Visit (HOSPITAL_COMMUNITY)
Admission: RE | Admit: 2019-01-23 | Discharge: 2019-01-23 | Disposition: A | Discharge status: Home / Self Care | Payer: Medicare HMO | Source: Ambulatory Visit | Attending: Family Medicine | Admitting: Family Medicine

## 2019-01-23 DIAGNOSIS — Z87891 Personal history of nicotine dependence: Secondary | ICD-10-CM | POA: Diagnosis not present

## 2019-01-23 DIAGNOSIS — K219 Gastro-esophageal reflux disease without esophagitis: Secondary | ICD-10-CM | POA: Diagnosis not present

## 2019-01-23 DIAGNOSIS — E039 Hypothyroidism, unspecified: Secondary | ICD-10-CM | POA: Diagnosis not present

## 2019-01-23 DIAGNOSIS — I10 Essential (primary) hypertension: Secondary | ICD-10-CM | POA: Diagnosis not present

## 2019-01-23 DIAGNOSIS — Z09 Encounter for follow-up examination after completed treatment for conditions other than malignant neoplasm: Secondary | ICD-10-CM | POA: Insufficient documentation

## 2019-01-23 DIAGNOSIS — D649 Anemia, unspecified: Secondary | ICD-10-CM | POA: Insufficient documentation

## 2019-01-23 NOTE — Progress Notes (Signed)
*  PRELIMINARY RESULTS* Echocardiogram 2D Echocardiogram has been performed.  Marilyn Solomon 01/23/2019, 1:57 PM

## 2019-01-24 ENCOUNTER — Other Ambulatory Visit (HOSPITAL_COMMUNITY): Payer: Medicare HMO

## 2019-01-24 DIAGNOSIS — J441 Chronic obstructive pulmonary disease with (acute) exacerbation: Secondary | ICD-10-CM | POA: Diagnosis not present

## 2019-01-24 DIAGNOSIS — J9621 Acute and chronic respiratory failure with hypoxia: Secondary | ICD-10-CM | POA: Diagnosis not present

## 2019-01-26 DIAGNOSIS — J441 Chronic obstructive pulmonary disease with (acute) exacerbation: Secondary | ICD-10-CM | POA: Diagnosis not present

## 2019-01-31 ENCOUNTER — Other Ambulatory Visit: Payer: Self-pay

## 2019-01-31 NOTE — Patient Outreach (Signed)
[place] [facility]) Care Management  01/31/2019  Marilyn Solomon 26-Apr-1946 [id number]    Attempted outreach with [name]. She was preparing to leave the home at the time of the call. Will contact RNCM when she is available.   PLAN -Pending return call. -Will follow-up next week if call is not returned today.   [place] Care Management [phone]

## 2019-02-05 DIAGNOSIS — J9621 Acute and chronic respiratory failure with hypoxia: Secondary | ICD-10-CM | POA: Diagnosis not present

## 2019-02-05 DIAGNOSIS — J441 Chronic obstructive pulmonary disease with (acute) exacerbation: Secondary | ICD-10-CM | POA: Diagnosis not present

## 2019-02-06 ENCOUNTER — Other Ambulatory Visit: Payer: Self-pay

## 2019-02-06 NOTE — Patient Outreach (Signed)
[place] [facility]) Care Management  02/06/2019  Marilyn Solomon 06/27/1945 [id number]    Attempted follow-up outreach with [name]. Phone rang without option to leave a voice message. Will follow-up next week.    [place] Care Management [phone]

## 2019-02-12 ENCOUNTER — Other Ambulatory Visit: Payer: Self-pay

## 2019-02-12 NOTE — Patient Outreach (Signed)
[place] [facility]) Care Management  02/12/2019  ZAILA CREW [birth year] [id number]    Attempted outreach with Ms. Sicard. Outreach unsuccessful. Phone rang multiple times without option to leave a voice message.   PLAN -Will mail unsuccessful outreach letter. -Will follow-up within 3-4 business days.   [place] Care Management [phone]

## 2019-02-19 ENCOUNTER — Other Ambulatory Visit: Payer: Self-pay

## 2019-02-19 NOTE — Patient Outreach (Addendum)
[place] [facility]) Care Management  02/19/2019  KABREA SEENEY 07-Apr-1946 [id number]    3rd unsuccessful outreach attempt. No response to previously mailed unsuccessful outreach letter. Will notify primary care provider and complete case closure.  [place] Care Management [phone]

## 2019-02-26 DIAGNOSIS — J441 Chronic obstructive pulmonary disease with (acute) exacerbation: Secondary | ICD-10-CM | POA: Diagnosis not present

## 2019-03-27 DIAGNOSIS — E039 Hypothyroidism, unspecified: Secondary | ICD-10-CM | POA: Diagnosis not present

## 2019-03-27 DIAGNOSIS — J9611 Chronic respiratory failure with hypoxia: Secondary | ICD-10-CM | POA: Diagnosis not present

## 2019-03-27 DIAGNOSIS — I10 Essential (primary) hypertension: Secondary | ICD-10-CM | POA: Diagnosis not present

## 2019-03-27 DIAGNOSIS — Z23 Encounter for immunization: Secondary | ICD-10-CM | POA: Diagnosis not present

## 2019-03-27 DIAGNOSIS — J449 Chronic obstructive pulmonary disease, unspecified: Secondary | ICD-10-CM | POA: Diagnosis not present

## 2019-03-28 DIAGNOSIS — J441 Chronic obstructive pulmonary disease with (acute) exacerbation: Secondary | ICD-10-CM | POA: Diagnosis not present

## 2019-04-04 DIAGNOSIS — J449 Chronic obstructive pulmonary disease, unspecified: Secondary | ICD-10-CM | POA: Diagnosis not present

## 2019-04-04 DIAGNOSIS — J9611 Chronic respiratory failure with hypoxia: Secondary | ICD-10-CM | POA: Diagnosis not present

## 2019-04-04 DIAGNOSIS — E039 Hypothyroidism, unspecified: Secondary | ICD-10-CM | POA: Diagnosis not present

## 2019-04-04 DIAGNOSIS — I10 Essential (primary) hypertension: Secondary | ICD-10-CM | POA: Diagnosis not present

## 2019-04-28 DIAGNOSIS — E039 Hypothyroidism, unspecified: Secondary | ICD-10-CM | POA: Diagnosis not present

## 2019-04-28 DIAGNOSIS — J441 Chronic obstructive pulmonary disease with (acute) exacerbation: Secondary | ICD-10-CM | POA: Diagnosis not present

## 2019-04-28 DIAGNOSIS — J45909 Unspecified asthma, uncomplicated: Secondary | ICD-10-CM | POA: Diagnosis not present

## 2019-04-28 DIAGNOSIS — I10 Essential (primary) hypertension: Secondary | ICD-10-CM | POA: Diagnosis not present

## 2019-04-28 DIAGNOSIS — J449 Chronic obstructive pulmonary disease, unspecified: Secondary | ICD-10-CM | POA: Diagnosis not present

## 2019-05-28 DIAGNOSIS — J441 Chronic obstructive pulmonary disease with (acute) exacerbation: Secondary | ICD-10-CM | POA: Diagnosis not present

## 2019-06-28 DIAGNOSIS — J441 Chronic obstructive pulmonary disease with (acute) exacerbation: Secondary | ICD-10-CM | POA: Diagnosis not present

## 2019-07-27 DIAGNOSIS — J441 Chronic obstructive pulmonary disease with (acute) exacerbation: Secondary | ICD-10-CM | POA: Diagnosis not present

## 2019-07-29 DIAGNOSIS — E039 Hypothyroidism, unspecified: Secondary | ICD-10-CM | POA: Diagnosis not present

## 2019-07-29 DIAGNOSIS — I10 Essential (primary) hypertension: Secondary | ICD-10-CM | POA: Diagnosis not present

## 2019-07-29 DIAGNOSIS — I110 Hypertensive heart disease with heart failure: Secondary | ICD-10-CM | POA: Diagnosis not present

## 2019-08-26 DIAGNOSIS — J441 Chronic obstructive pulmonary disease with (acute) exacerbation: Secondary | ICD-10-CM | POA: Diagnosis not present

## 2019-09-26 DIAGNOSIS — J441 Chronic obstructive pulmonary disease with (acute) exacerbation: Secondary | ICD-10-CM | POA: Diagnosis not present

## 2019-09-27 ENCOUNTER — Encounter (HOSPITAL_COMMUNITY): Payer: Self-pay | Admitting: *Deleted

## 2019-09-27 ENCOUNTER — Other Ambulatory Visit: Payer: Self-pay

## 2019-09-27 ENCOUNTER — Emergency Department (HOSPITAL_COMMUNITY): Payer: Medicare Other

## 2019-09-27 ENCOUNTER — Inpatient Hospital Stay (HOSPITAL_COMMUNITY)
Admission: EM | Admit: 2019-09-27 | Discharge: 2019-10-01 | DRG: 177 | Disposition: A | Discharge status: Home / Self Care | Payer: Medicare Other | Attending: Internal Medicine | Admitting: Internal Medicine

## 2019-09-27 DIAGNOSIS — I129 Hypertensive chronic kidney disease with stage 1 through stage 4 chronic kidney disease, or unspecified chronic kidney disease: Secondary | ICD-10-CM | POA: Diagnosis not present

## 2019-09-27 DIAGNOSIS — Z9841 Cataract extraction status, right eye: Secondary | ICD-10-CM

## 2019-09-27 DIAGNOSIS — Z7951 Long term (current) use of inhaled steroids: Secondary | ICD-10-CM

## 2019-09-27 DIAGNOSIS — N182 Chronic kidney disease, stage 2 (mild): Secondary | ICD-10-CM | POA: Diagnosis present

## 2019-09-27 DIAGNOSIS — F419 Anxiety disorder, unspecified: Secondary | ICD-10-CM | POA: Diagnosis present

## 2019-09-27 DIAGNOSIS — U071 COVID-19: Principal | ICD-10-CM | POA: Diagnosis present

## 2019-09-27 DIAGNOSIS — Z800 Family history of malignant neoplasm of digestive organs: Secondary | ICD-10-CM

## 2019-09-27 DIAGNOSIS — J1282 Pneumonia due to coronavirus disease 2019: Secondary | ICD-10-CM | POA: Diagnosis not present

## 2019-09-27 DIAGNOSIS — Z9981 Dependence on supplemental oxygen: Secondary | ICD-10-CM

## 2019-09-27 DIAGNOSIS — Z8249 Family history of ischemic heart disease and other diseases of the circulatory system: Secondary | ICD-10-CM

## 2019-09-27 DIAGNOSIS — Z833 Family history of diabetes mellitus: Secondary | ICD-10-CM | POA: Diagnosis not present

## 2019-09-27 DIAGNOSIS — N179 Acute kidney failure, unspecified: Secondary | ICD-10-CM | POA: Diagnosis present

## 2019-09-27 DIAGNOSIS — G894 Chronic pain syndrome: Secondary | ICD-10-CM | POA: Diagnosis present

## 2019-09-27 DIAGNOSIS — M549 Dorsalgia, unspecified: Secondary | ICD-10-CM | POA: Diagnosis not present

## 2019-09-27 DIAGNOSIS — E039 Hypothyroidism, unspecified: Secondary | ICD-10-CM | POA: Diagnosis present

## 2019-09-27 DIAGNOSIS — Z96642 Presence of left artificial hip joint: Secondary | ICD-10-CM | POA: Diagnosis not present

## 2019-09-27 DIAGNOSIS — J9621 Acute and chronic respiratory failure with hypoxia: Secondary | ICD-10-CM | POA: Diagnosis not present

## 2019-09-27 DIAGNOSIS — K219 Gastro-esophageal reflux disease without esophagitis: Secondary | ICD-10-CM | POA: Diagnosis not present

## 2019-09-27 DIAGNOSIS — Z885 Allergy status to narcotic agent status: Secondary | ICD-10-CM

## 2019-09-27 DIAGNOSIS — J440 Chronic obstructive pulmonary disease with acute lower respiratory infection: Secondary | ICD-10-CM | POA: Diagnosis present

## 2019-09-27 DIAGNOSIS — D631 Anemia in chronic kidney disease: Secondary | ICD-10-CM | POA: Diagnosis present

## 2019-09-27 DIAGNOSIS — Z79899 Other long term (current) drug therapy: Secondary | ICD-10-CM

## 2019-09-27 DIAGNOSIS — R05 Cough: Secondary | ICD-10-CM | POA: Diagnosis not present

## 2019-09-27 DIAGNOSIS — Z79890 Hormone replacement therapy (postmenopausal): Secondary | ICD-10-CM

## 2019-09-27 DIAGNOSIS — N189 Chronic kidney disease, unspecified: Secondary | ICD-10-CM | POA: Diagnosis not present

## 2019-09-27 DIAGNOSIS — Z90710 Acquired absence of both cervix and uterus: Secondary | ICD-10-CM

## 2019-09-27 DIAGNOSIS — J441 Chronic obstructive pulmonary disease with (acute) exacerbation: Secondary | ICD-10-CM | POA: Diagnosis present

## 2019-09-27 DIAGNOSIS — Z79891 Long term (current) use of opiate analgesic: Secondary | ICD-10-CM

## 2019-09-27 DIAGNOSIS — Z87891 Personal history of nicotine dependence: Secondary | ICD-10-CM

## 2019-09-27 DIAGNOSIS — Z9842 Cataract extraction status, left eye: Secondary | ICD-10-CM | POA: Diagnosis not present

## 2019-09-27 DIAGNOSIS — Z880 Allergy status to penicillin: Secondary | ICD-10-CM

## 2019-09-27 LAB — BASIC METABOLIC PANEL
Anion gap: 12 (ref 5–15)
BUN: 60 mg/dL — ABNORMAL HIGH (ref 8–23)
CO2: 26 mmol/L (ref 22–32)
Calcium: 8.4 mg/dL — ABNORMAL LOW (ref 8.9–10.3)
Chloride: 103 mmol/L (ref 98–111)
Creatinine, Ser: 2.17 mg/dL — ABNORMAL HIGH (ref 0.44–1.00)
GFR calc Af Amer: 25 mL/min — ABNORMAL LOW (ref >60)
GFR calc non Af Amer: 22 mL/min — ABNORMAL LOW (ref >60)
Glucose, Bld: 117 mg/dL — ABNORMAL HIGH (ref 70–99)
Potassium: 3.9 mmol/L (ref 3.5–5.1)
Sodium: 141 mmol/L (ref 135–145)

## 2019-09-27 LAB — RESPIRATORY PANEL BY RT PCR (FLU A&B, COVID)
Influenza A by PCR: NEGATIVE
Influenza B by PCR: NEGATIVE
SARS Coronavirus 2 by RT PCR: POSITIVE — AB

## 2019-09-27 LAB — CBC WITH DIFFERENTIAL/PLATELET
Abs Immature Granulocytes: 0.12 10*3/uL — ABNORMAL HIGH (ref 0.00–0.07)
Basophils Absolute: 0 10*3/uL (ref 0.0–0.1)
Basophils Relative: 0 %
Eosinophils Absolute: 0 10*3/uL (ref 0.0–0.5)
Eosinophils Relative: 0 %
HCT: 32.2 % — ABNORMAL LOW (ref 36.0–46.0)
Hemoglobin: 9.4 g/dL — ABNORMAL LOW (ref 12.0–15.0)
Immature Granulocytes: 2 %
Lymphocytes Relative: 7 %
Lymphs Abs: 0.5 10*3/uL — ABNORMAL LOW (ref 0.7–4.0)
MCH: 26.5 pg (ref 26.0–34.0)
MCHC: 29.2 g/dL — ABNORMAL LOW (ref 30.0–36.0)
MCV: 90.7 fL (ref 80.0–100.0)
Monocytes Absolute: 0.8 10*3/uL (ref 0.1–1.0)
Monocytes Relative: 11 %
Neutro Abs: 5.9 10*3/uL (ref 1.7–7.7)
Neutrophils Relative %: 80 %
Platelets: 313 10*3/uL (ref 150–400)
RBC: 3.55 MIL/uL — ABNORMAL LOW (ref 3.87–5.11)
RDW: 14.3 % (ref 11.5–15.5)
WBC: 7.4 10*3/uL (ref 4.0–10.5)
nRBC: 0 % (ref 0.0–0.2)

## 2019-09-27 MED ORDER — ALBUTEROL SULFATE HFA 108 (90 BASE) MCG/ACT IN AERS
6.0000 | INHALATION_SPRAY | Freq: Four times a day (QID) | RESPIRATORY_TRACT | Status: DC | PRN
  Administered 2019-09-27 – 2019-09-28 (×2): 6 via RESPIRATORY_TRACT

## 2019-09-27 MED ORDER — SODIUM CHLORIDE 0.9 % IV BOLUS
1000.0000 mL | Freq: Once | INTRAVENOUS | Status: AC
  Administered 2019-09-27: 1000 mL via INTRAVENOUS

## 2019-09-27 MED ORDER — IPRATROPIUM-ALBUTEROL 0.5-2.5 (3) MG/3ML IN SOLN: 3.0000 mL | Freq: Once | RESPIRATORY_TRACT | Status: DC

## 2019-09-27 MED ORDER — AZITHROMYCIN 250 MG PO TABS
500.0000 mg | ORAL_TABLET | Freq: Once | ORAL | Status: DC
  Filled 2019-09-27: qty 2

## 2019-09-27 MED ORDER — ALBUTEROL SULFATE HFA 108 (90 BASE) MCG/ACT IN AERS
2.0000 | INHALATION_SPRAY | Freq: Once | RESPIRATORY_TRACT | Status: AC
  Administered 2019-09-27: 21:00:00 2 via RESPIRATORY_TRACT
  Filled 2019-09-27: qty 6.7

## 2019-09-27 MED ORDER — PREDNISONE 50 MG PO TABS
60.0000 mg | ORAL_TABLET | Freq: Once | ORAL | Status: AC
  Administered 2019-09-27: 60 mg via ORAL
  Filled 2019-09-27: qty 1

## 2019-09-27 MED ORDER — SODIUM CHLORIDE 0.9 % IV SOLN
1.0000 g | Freq: Once | INTRAVENOUS | Status: DC
  Filled 2019-09-27: qty 10

## 2019-09-27 NOTE — ED Notes (Signed)
Patient has not been tested for Covid as of yet. Nebs are on hold.

## 2019-09-27 NOTE — ED Triage Notes (Signed)
Pt with cough since Wednesday, productive and yellow in color.  Pt with RA sat of 79 % and pt wears home O2 at 2 L/M but her daughter forgot her oxygen.

## 2019-09-27 NOTE — ED Provider Notes (Signed)
2nd inhaler treatment given nebulizer on hold for covid test

## 2019-09-27 NOTE — ED Provider Notes (Signed)
[facility] EMERGENCY DEPARTMENT Provider Note   CSN: [id number] Arrival date & time: 09/27/19  1920     History Chief Complaint  Patient presents with  . Cough    Marilyn Solomon is a 74 y.o. female with history of COPD who presents with body aches and a cough. She states that she's been sick since Wednesday. She has had a lot of chest congestion and is coughing up yellow phlegm. She has been using her home O2 more continuously because she has been more short of breath. She has also been using her nebulizer more often. She denies fever but has body aches, back ache. She denies chest pain or leg swelling. She feels dehydrated and has been drinking a lot of Gatorade but her urine still looks dark. She has not had COVID or a COVID vaccine. Pts sats were 79% on RA in triage.  HPI     Past Medical History:  Diagnosis Date  . Anemia   . Anxiety   . Arthritis   . Chronic back pain   . COPD (chronic obstructive pulmonary disease) ([place])   . GERD (gastroesophageal reflux disease)   . Hypertension   . Hypothyroidism   . Reflux    no medications currently, asymptomatic  . Thyroid disease     Patient Active Problem List   Diagnosis Date Noted  . Hypokalemia 11/07/2018  . Hyperglycemia 11/07/2018  . Acute on chronic respiratory failure with hypoxemia ([place]) 06/21/2018  . COPD exacerbation ([place]) 06/02/2018  . Anemia 06/02/2018  . Hypertension 06/02/2018  . COPD with acute exacerbation ([place]) 01/31/2018  . Tachycardia 01/31/2018  . Hypoxia 01/31/2018  . Leukocytosis 01/31/2018  . Positive colorectal cancer screening using Cologuard test 08/21/2017  . Normocytic anemia   . Anxiety 06/30/2017  . Hypothyroidism 06/30/2017  . GERD (gastroesophageal reflux disease) 06/30/2017  . COPD (chronic obstructive pulmonary disease) ([place]) 06/30/2017  . Elevated liver enzymes 06/30/2017  . Severe Iron deficiency anemia 06/30/2017  . HCAP (healthcare-associated pneumonia) 06/28/2017  . CAP  (community acquired pneumonia) 06/28/2017  . Second degree hemorrhoids   . Encounter for screening colonoscopy 10/28/2015    Past Surgical History:  Procedure Laterality Date  . ABDOMINAL HYSTERECTOMY    . BIOPSY  10/08/2017   Procedure: BIOPSY;  Surgeon: Daneil Dolin, MD;  Location: [facility] ENDO SUITE;  Service: Endoscopy;;  gastric  . cataracts Bilateral   . COLONOSCOPY  2007   Dr. Gala Romney: normal rectum, diverticula   . COLONOSCOPY WITH PROPOFOL N/A 11/15/2015   Dr. Gala Romney: grade II hemorrhoids, diverticulosis  . COLONOSCOPY WITH PROPOFOL N/A 10/08/2017   Procedure: COLONOSCOPY WITH PROPOFOL;  Surgeon: Daneil Dolin, MD;  Location: [facility] ENDO SUITE;  Service: Endoscopy;  Laterality: N/A;  11:15am  . ESOPHAGOGASTRODUODENOSCOPY (EGD) WITH PROPOFOL N/A 10/08/2017   Procedure: ESOPHAGOGASTRODUODENOSCOPY (EGD) WITH PROPOFOL;  Surgeon: Daneil Dolin, MD;  Location: [facility] END[facility];  Service: Endoscopy;  Laterality: N/A;  . FRACTURE SURGERY Right 2005   Wrist  . JOINT REPLACEMENT Left 2000   hip  . TOTAL HIP ARTHROPLASTY Left   . WRIST SURGERY Right      OB History   No obstetric history on file.     Family History  Problem Relation Age of Onset  . Bone cancer Sister   . Pancreatic cancer Sister   . Pneumonia Mother   . Heart attack Father   . Diabetes Sister   . Dementia Sister   . Colon cancer Neg Hx  Social History   Tobacco Use  . Smoking status: Former Smoker    Packs/day: 25.00    Years: 1.00    Pack years: 25.00    Types: Cigarettes    Quit date: 08/27/1998    Years since quitting: 21.0  . Smokeless tobacco: Never Used  . Tobacco comment: quit in 2000  Substance Use Topics  . Alcohol use: No    Alcohol/week: 0.0 standard drinks  . Drug use: No    Home Medications Prior to Admission medications   Medication Sig Start Date End Date Taking? Authorizing Provider  ALPRAZolam Duanne Moron) 1 MG tablet Take 1 mg by mouth every 8 (eight) hours as needed for anxiety.   08/10/14   [provider]  benzonatate (TESSALON) 200 MG capsule Take 1 capsule (200 mg total) by mouth 3 (three) times daily as needed (Excessive coughing spells). 06/23/18   Barton Dubois, MD  Fluticasone-Salmeterol (ADVAIR DISKUS) 250-50 MCG/DOSE AEPB Inhale 1 puff into the lungs 2 (two) times daily. 06/23/18 06/23/19  Barton Dubois, MD  Fluticasone-Umeclidin-Vilant (TRELEGY ELLIPTA) 100-62.5-25 MCG/INH AEPB Inhale into the lungs. 12/25/18   Sinda Du, MD  ipratropium-albuterol (DUONEB) 0.5-2.5 (3) MG/3ML SOLN Take 3 mLs by nebulization every 6 (six) hours as needed (shortness of breath or wheezing). 06/04/18 11/07/18  Manuella Ghazi, Pratik D, DO  levothyroxine (SYNTHROID) 50 MCG tablet Take 1 tablet by mouth daily. 09/11/18   [provider]  oxyCODONE (ROXICODONE) 15 MG immediate release tablet Take 15 mg by mouth 3 (three) times daily as needed for pain.     [provider]  pantoprazole (PROTONIX) 40 MG tablet TAKE 1 TABLET BY MOUTH DAILY. Patient taking differently: Take 40 mg by mouth daily.  05/27/18   Annitta Needs, NP  potassium chloride (K-DUR) 10 MEQ tablet Take 1 tablet by mouth daily. 10/09/18   [provider]  predniSONE (DELTASONE) 20 MG tablet Take 2 daily QAM x 5 days, then 1 QAM x 5 days Patient not taking: Reported on 12/13/2018 11/08/18   Murlean Iba, MD    Allergies    Penicillins and Vicodin [hydrocodone-acetaminophen]  Review of Systems   Review of Systems  Constitutional: Negative for chills and fever.  Respiratory: Positive for cough, shortness of breath and wheezing.   Cardiovascular: Negative for chest pain.  Musculoskeletal: Positive for back pain and myalgias.  All other systems reviewed and are negative.   Physical Exam Updated Vital Signs BP (!) 105/53 (BP Location: Right Arm)   Pulse (!) 112   Temp 99.7 F (37.6 C) (Oral)   Resp 18   Ht 5\' 5"  (1.651 m)   Wt 50.8 kg   SpO2 95%   BMI 18.64 kg/m   Physical  Exam Vitals and nursing note reviewed.  Constitutional:      General: She is not in acute distress.    Appearance: Normal appearance. She is well-developed.     Comments: Chronically ill appearing. NAD  HENT:     Head: Normocephalic and atraumatic.  Eyes:     General: No scleral icterus.       Right eye: No discharge.        Left eye: No discharge.     Conjunctiva/sclera: Conjunctivae normal.     Pupils: Pupils are equal, round, and reactive to light.  Cardiovascular:     Rate and Rhythm: Tachycardia present.  Pulmonary:     Effort: Pulmonary effort is normal. No respiratory distress.     Breath sounds: Wheezing and  rhonchi present.  Abdominal:     General: There is no distension.     Palpations: Abdomen is soft.     Tenderness: There is no abdominal tenderness.  Musculoskeletal:     Cervical back: Normal range of motion.     Right lower leg: No edema.     Left lower leg: No edema.  Skin:    General: Skin is warm and dry.  Neurological:     Mental Status: She is alert and oriented to person, place, and time.  Psychiatric:        Behavior: Behavior normal.     ED Results / Procedures / Treatments   Labs (all labs ordered are listed, but only abnormal results are displayed) Labs Reviewed  RESPIRATORY PANEL BY RT PCR (FLU A&B, COVID) - Abnormal; Notable for the following components:      Result Value   SARS Coronavirus 2 by RT PCR POSITIVE (*)    All other components within normal limits  BASIC METABOLIC PANEL - Abnormal; Notable for the following components:   Glucose, Bld 117 (*)    BUN 60 (*)    Creatinine, Ser 2.17 (*)    Calcium 8.4 (*)    GFR calc non Af Amer 22 (*)    GFR calc Af Amer 25 (*)    All other components within normal limits  CBC WITH DIFFERENTIAL/PLATELET - Abnormal; Notable for the following components:   RBC 3.55 (*)    Hemoglobin 9.4 (*)    HCT 32.2 (*)    MCHC 29.2 (*)    Lymphs Abs 0.5 (*)    Abs Immature Granulocytes 0.12 (*)    All  other components within normal limits  CULTURE, BLOOD (ROUTINE X 2)  CULTURE, BLOOD (ROUTINE X 2)  LACTIC ACID, PLASMA  LACTIC ACID, PLASMA  D-DIMER, QUANTITATIVE (NOT AT [facility])  PROCALCITONIN  LACTATE DEHYDROGENASE  FERRITIN  TRIGLYCERIDES  FIBRINOGEN  C-REACTIVE PROTEIN  HEPATIC FUNCTION PANEL    EKG None  Radiology DG Chest 2 View  Result Date: 09/27/2019 CLINICAL DATA:  Cough. EXAM: CHEST - 2 VIEW COMPARISON:  November 06, 2018 FINDINGS: The lungs are hyperinflated. Moderate severity diffusely increased interstitial lung markings are seen. This is similar in appearance when compared to the prior study. Mild, predominant stable right apical scarring and/or atelectasis is seen. Mild areas of atelectasis and/or infiltrate are seen along the periphery of the bilateral lung bases. This represents a new finding when compared to the prior study. There is no evidence of a pleural effusion or pneumothorax. The heart size and mediastinal contours are within normal limits. There is moderate severity calcification of the aortic arch. Degenerative changes seen throughout the thoracic spine. IMPRESSION: 1. Mild bibasilar atelectasis and/or infiltrate, new when compared to the prior study. 2. Chronic interstitial lung disease with stable right apical scarring and/or atelectasis. Electronically Signed   By: Virgina Norfolk M.D.   On: 09/27/2019 21:11    Procedures Procedures (including critical care time)  Medications Ordered in ED Medications  albuterol (VENTOLIN HFA) 108 (90 Base) MCG/ACT inhaler 6 puff (6 puffs Inhalation Given 09/27/19 2216)  albuterol (VENTOLIN HFA) 108 (90 Base) MCG/ACT inhaler 2 puff (2 puffs Inhalation Given 09/27/19 2107)  predniSONE (DELTASONE) tablet 60 mg (60 mg Oral Given 09/27/19 2127)  sodium chloride 0.9 % bolus 1,000 mL (1,000 mLs Intravenous New Bag/Given (Non-Interop) 09/27/19 2334)    ED Course  I have reviewed the triage vital signs and the nursing  notes.  Pertinent labs & imaging results that were available during my care of the patient were reviewed by me and considered in my medical decision making (see chart for details).  73 year old female presents with SOB, cough, wheezing with body aches. Pt temp is 99.7 here. She is tachycardic between 110-120. Sats were 79% on RA in triage but improved to 95% on 3L. Pt does endorse having to wear O2 more over the past couple days since she has been "sick". Lung sounds have diffuse rhonchi and wheezing. Will order labs, EKG, CXR. Will give albuterol tx and steroids.  CBC shows anemia (9.4) which is stable. BMP shows AKI with BUN 60 and SCr 2.17. CXR shows atelectasis vs developing infiltrate in the bibasilar lung bases. Will add abx, fluids, and obtain COVID test. Shared visit with Dr. Laverta Baltimore.   COVID test is positive. Will add inflammatory markers. Discussed with Dr. Darrick Meigs who will admit for acute on chronic respiratory failure due to COVID pneumonia and COPD exacerbation as well as AKI.  NELLINE LIO was evaluated in Emergency Department on 09/27/2019 for the symptoms described in the history of present illness. She was evaluated in the context of the global COVID-19 pandemic, which necessitated consideration that the patient might be at risk for infection with the SARS-CoV-2 virus that causes COVID-19. Institutional protocols and algorithms that pertain to the evaluation of patients at risk for COVID-19 are in a state of rapid change based on information released by regulatory bodies including the CDC and federal and state organizations. These policies and algorithms were followed during the patient's care in the ED.   MDM Rules/Calculators/A&P                       Final Clinical Impression(s) / ED Diagnoses Final diagnoses:  Chronic obstructive pulmonary disease with acute exacerbation ([place])  AKI (acute kidney injury) ([place])  COVID-19    Rx / DC Orders ED Discharge Orders    None        Recardo Evangelist, PA-C 09/27/19 2344    Margette Fast, MD 09/30/19 1335

## 2019-09-28 DIAGNOSIS — J441 Chronic obstructive pulmonary disease with (acute) exacerbation: Secondary | ICD-10-CM | POA: Diagnosis not present

## 2019-09-28 DIAGNOSIS — J9621 Acute and chronic respiratory failure with hypoxia: Secondary | ICD-10-CM | POA: Diagnosis not present

## 2019-09-28 DIAGNOSIS — Z9841 Cataract extraction status, right eye: Secondary | ICD-10-CM | POA: Diagnosis not present

## 2019-09-28 DIAGNOSIS — Z800 Family history of malignant neoplasm of digestive organs: Secondary | ICD-10-CM | POA: Diagnosis not present

## 2019-09-28 DIAGNOSIS — Z90710 Acquired absence of both cervix and uterus: Secondary | ICD-10-CM | POA: Diagnosis not present

## 2019-09-28 DIAGNOSIS — G894 Chronic pain syndrome: Secondary | ICD-10-CM | POA: Diagnosis present

## 2019-09-28 DIAGNOSIS — Z885 Allergy status to narcotic agent status: Secondary | ICD-10-CM | POA: Diagnosis not present

## 2019-09-28 DIAGNOSIS — J440 Chronic obstructive pulmonary disease with acute lower respiratory infection: Secondary | ICD-10-CM | POA: Diagnosis present

## 2019-09-28 DIAGNOSIS — Z9842 Cataract extraction status, left eye: Secondary | ICD-10-CM | POA: Diagnosis not present

## 2019-09-28 DIAGNOSIS — Z880 Allergy status to penicillin: Secondary | ICD-10-CM | POA: Diagnosis not present

## 2019-09-28 DIAGNOSIS — F419 Anxiety disorder, unspecified: Secondary | ICD-10-CM | POA: Diagnosis present

## 2019-09-28 DIAGNOSIS — Z833 Family history of diabetes mellitus: Secondary | ICD-10-CM | POA: Diagnosis not present

## 2019-09-28 DIAGNOSIS — M549 Dorsalgia, unspecified: Secondary | ICD-10-CM | POA: Diagnosis present

## 2019-09-28 DIAGNOSIS — U071 COVID-19: Secondary | ICD-10-CM | POA: Diagnosis not present

## 2019-09-28 DIAGNOSIS — J1282 Pneumonia due to coronavirus disease 2019: Secondary | ICD-10-CM | POA: Diagnosis present

## 2019-09-28 DIAGNOSIS — Z8249 Family history of ischemic heart disease and other diseases of the circulatory system: Secondary | ICD-10-CM | POA: Diagnosis not present

## 2019-09-28 DIAGNOSIS — N179 Acute kidney failure, unspecified: Secondary | ICD-10-CM

## 2019-09-28 DIAGNOSIS — E039 Hypothyroidism, unspecified: Secondary | ICD-10-CM | POA: Diagnosis present

## 2019-09-28 DIAGNOSIS — Z9981 Dependence on supplemental oxygen: Secondary | ICD-10-CM | POA: Diagnosis not present

## 2019-09-28 DIAGNOSIS — N182 Chronic kidney disease, stage 2 (mild): Secondary | ICD-10-CM | POA: Diagnosis present

## 2019-09-28 DIAGNOSIS — Z96642 Presence of left artificial hip joint: Secondary | ICD-10-CM | POA: Diagnosis present

## 2019-09-28 DIAGNOSIS — I129 Hypertensive chronic kidney disease with stage 1 through stage 4 chronic kidney disease, or unspecified chronic kidney disease: Secondary | ICD-10-CM | POA: Diagnosis present

## 2019-09-28 DIAGNOSIS — K219 Gastro-esophageal reflux disease without esophagitis: Secondary | ICD-10-CM | POA: Diagnosis not present

## 2019-09-28 DIAGNOSIS — Z87891 Personal history of nicotine dependence: Secondary | ICD-10-CM | POA: Diagnosis not present

## 2019-09-28 HISTORY — DX: Pneumonia due to coronavirus disease 2019: J12.82

## 2019-09-28 HISTORY — DX: COVID-19: U07.1

## 2019-09-28 LAB — HEPATIC FUNCTION PANEL
ALT: 10 U/L (ref 0–44)
AST: 21 U/L (ref 15–41)
Albumin: 2.7 g/dL — ABNORMAL LOW (ref 3.5–5.0)
Alkaline Phosphatase: 82 U/L (ref 38–126)
Bilirubin, Direct: 0.1 mg/dL (ref 0.0–0.2)
Indirect Bilirubin: 0.1 mg/dL — ABNORMAL LOW (ref 0.3–0.9)
Total Bilirubin: 0.2 mg/dL — ABNORMAL LOW (ref 0.3–1.2)
Total Protein: 6.7 g/dL (ref 6.5–8.1)

## 2019-09-28 LAB — D-DIMER, QUANTITATIVE: D-Dimer, Quant: 3.83 ug/mL-FEU — ABNORMAL HIGH (ref 0.00–0.50)

## 2019-09-28 LAB — TRIGLYCERIDES: Triglycerides: 97 mg/dL (ref <150)

## 2019-09-28 LAB — ABO/RH: ABO/RH(D): A POS

## 2019-09-28 LAB — LACTATE DEHYDROGENASE: LDH: 124 U/L (ref 98–192)

## 2019-09-28 LAB — FIBRINOGEN: Fibrinogen: >800 mg/dL — ABNORMAL HIGH (ref 210–475)

## 2019-09-28 LAB — C-REACTIVE PROTEIN: CRP: 19.9 mg/dL — ABNORMAL HIGH (ref <1.0)

## 2019-09-28 LAB — LACTIC ACID, PLASMA
Lactic Acid, Venous: 1.8 mmol/L (ref 0.5–1.9)
Lactic Acid, Venous: 1.3 mmol/L (ref 0.5–1.9)

## 2019-09-28 LAB — FERRITIN: Ferritin: 186 ng/mL (ref 11–307)

## 2019-09-28 LAB — PROCALCITONIN: Procalcitonin: 1.49 ng/mL

## 2019-09-28 MED ORDER — DEXAMETHASONE 4 MG PO TABS: 6.0000 mg | ORAL_TABLET | ORAL | Status: DC

## 2019-09-28 MED ORDER — ZINC SULFATE 220 (50 ZN) MG PO CAPS
220.0000 mg | ORAL_CAPSULE | Freq: Every day | ORAL | Status: DC
  Administered 2019-09-28 – 2019-10-01 (×4): 220 mg via ORAL
  Filled 2019-09-28 (×4): qty 1

## 2019-09-28 MED ORDER — ONDANSETRON HCL 4 MG/2ML IJ SOLN: 4.0000 mg | Freq: Four times a day (QID) | INTRAMUSCULAR | Status: DC | PRN

## 2019-09-28 MED ORDER — SODIUM CHLORIDE 0.9 % IV SOLN: INTRAVENOUS | Status: DC

## 2019-09-28 MED ORDER — IPRATROPIUM-ALBUTEROL 20-100 MCG/ACT IN AERS
1.0000 | INHALATION_SPRAY | Freq: Four times a day (QID) | RESPIRATORY_TRACT | Status: DC
  Administered 2019-09-28 – 2019-09-29 (×4): 1 via RESPIRATORY_TRACT

## 2019-09-28 MED ORDER — ALPRAZOLAM 1 MG PO TABS
1.0000 mg | ORAL_TABLET | Freq: Three times a day (TID) | ORAL | Status: DC | PRN
  Administered 2019-09-28: 1 mg via ORAL
  Filled 2019-09-28: qty 1

## 2019-09-28 MED ORDER — MOMETASONE FURO-FORMOTEROL FUM 200-5 MCG/ACT IN AERO
2.0000 | INHALATION_SPRAY | Freq: Two times a day (BID) | RESPIRATORY_TRACT | Status: DC
  Administered 2019-09-28 – 2019-10-01 (×6): 2 via RESPIRATORY_TRACT

## 2019-09-28 MED ORDER — LEVOTHYROXINE SODIUM 50 MCG PO TABS
50.0000 ug | ORAL_TABLET | Freq: Every day | ORAL | Status: DC
  Administered 2019-09-28 – 2019-10-01 (×4): 50 ug via ORAL
  Filled 2019-09-28 (×4): qty 1

## 2019-09-28 MED ORDER — MOMETASONE FURO-FORMOTEROL FUM 200-5 MCG/ACT IN AERO
INHALATION_SPRAY | RESPIRATORY_TRACT | Status: AC
  Administered 2019-09-28: 09:00:00 2 via RESPIRATORY_TRACT
  Filled 2019-09-28: qty 8.8

## 2019-09-28 MED ORDER — ONDANSETRON HCL 4 MG PO TABS: 4.0000 mg | ORAL_TABLET | Freq: Four times a day (QID) | ORAL | Status: DC | PRN

## 2019-09-28 MED ORDER — SODIUM CHLORIDE 0.9 % IV SOLN
INTRAVENOUS | Status: AC
  Filled 2019-09-28 (×2): qty 20

## 2019-09-28 MED ORDER — HEPARIN SODIUM (PORCINE) 5000 UNIT/ML IJ SOLN
5000.0000 [IU] | Freq: Three times a day (TID) | INTRAMUSCULAR | Status: DC
  Administered 2019-09-28 – 2019-10-01 (×10): 5000 [IU] via SUBCUTANEOUS
  Filled 2019-09-28 (×9): qty 1

## 2019-09-28 MED ORDER — SODIUM CHLORIDE 0.9 % IV SOLN
100.0000 mg | Freq: Every day | INTRAVENOUS | Status: DC
  Administered 2019-09-29 – 2019-10-01 (×3): 100 mg via INTRAVENOUS
  Filled 2019-09-28 (×3): qty 20

## 2019-09-28 MED ORDER — ASCORBIC ACID 500 MG PO TABS
500.0000 mg | ORAL_TABLET | Freq: Every day | ORAL | Status: DC
  Administered 2019-09-28 – 2019-10-01 (×4): 500 mg via ORAL
  Filled 2019-09-28 (×4): qty 1

## 2019-09-28 MED ORDER — DEXAMETHASONE SODIUM PHOSPHATE 10 MG/ML IJ SOLN
8.0000 mg | INTRAMUSCULAR | Status: DC
  Administered 2019-09-28 – 2019-09-30 (×3): 8 mg via INTRAVENOUS
  Filled 2019-09-28 (×3): qty 1

## 2019-09-28 MED ORDER — PANTOPRAZOLE SODIUM 40 MG PO TBEC
40.0000 mg | DELAYED_RELEASE_TABLET | Freq: Every day | ORAL | Status: DC
  Administered 2019-09-28 – 2019-10-01 (×4): 40 mg via ORAL
  Filled 2019-09-28 (×4): qty 1

## 2019-09-28 MED ORDER — SODIUM CHLORIDE 0.9 % IV SOLN
100.0000 mg | INTRAVENOUS | Status: AC
  Administered 2019-09-28 (×2): 100 mg via INTRAVENOUS
  Filled 2019-09-28 (×2): qty 20

## 2019-09-28 MED ORDER — IPRATROPIUM-ALBUTEROL 20-100 MCG/ACT IN AERS
INHALATION_SPRAY | RESPIRATORY_TRACT | Status: AC
  Administered 2019-09-28: 09:00:00 1 via RESPIRATORY_TRACT
  Filled 2019-09-28: qty 4

## 2019-09-28 MED ORDER — OXYCODONE HCL 5 MG PO TABS: 15.0000 mg | ORAL_TABLET | Freq: Three times a day (TID) | ORAL | Status: DC | PRN

## 2019-09-28 MED ORDER — SODIUM CHLORIDE 0.9 % IV SOLN
200.0000 mg | Freq: Once | INTRAVENOUS | Status: DC
  Administered 2019-09-28: 200 mg via INTRAVENOUS

## 2019-09-28 MED ORDER — SODIUM CHLORIDE 0.9 % IV SOLN: 100.0000 mg | Freq: Every day | INTRAVENOUS | Status: DC

## 2019-09-28 NOTE — Progress Notes (Signed)
Patient seen and examined.  Admitted after midnight secondary to worsening shortness of breath and acute on chronic respiratory failure with hypoxia.  She chronically uses 2 L of oxygen supplementation and has been requiring 3-4 to maintain O2 sats above 90-92%.  Positive COVID-19 PCR appreciate on examination, elevated CPR and interstitial markings bilaterally on chest x-ray.  Patient currently afebrile.  Otherwise hemodynamically stable.  Please refer to H&P written by Dr. Darrick Meigs on 09/28/2019 for further info/details on admission.  Plan: -Continue gentle/judicious IV fluids. -Continue steroids and remdesivir -Check procalcitonin level inflammatory markers -Continue weaning oxygen supplementation as tolerated to baseline -Follow clinical response.  Barton Dubois MD [phone]

## 2019-09-28 NOTE — Plan of Care (Signed)
  Problem: Respiratory: Goal: Ability to maintain adequate ventilation will improve Outcome: Progressing Goal: Ability to maintain a clear airway will improve Outcome: Progressing   Problem: Education: Goal: Knowledge of risk factors and measures for prevention of condition will improve Outcome: Progressing   Problem: Coping: Goal: Psychosocial and spiritual needs will be supported Outcome: Progressing   Problem: Respiratory: Goal: Complications related to the disease process, condition or treatment will be avoided or minimized Outcome: Progressing

## 2019-09-28 NOTE — H&P (Addendum)
TRH H&P    Patient Demographics:    Marilyn Solomon, is a 74 y.o. female  MRN: [id number]  DOB - 1946/01/16  Admit Date - 09/27/2019  Referring MD/NP/PA: Janetta Hora  Outpatient Primary MD for the patient is Lucianne Lei, MD  Patient coming from: Home  Chief complaint-shortness of breath   HPI:    Marilyn Solomon  is a 74 y.o. female, with history of COPD, chronic back pain, GERD, anxiety,  hypothyroidism who came to hospital with complaints of body aches and cough.  Patient states that she has been sick since Wednesday.  She also complains of chest pain with coughing and coughing up yellow phlegm.  She denies shortness of breath.  She is on home oxygen at 2 L/min.  In the ED her oxygen saturations found to be 79% on room air. She had nausea and vomiting few days ago but is not vomiting anymore. She denies abdominal pain or dysuria No previous history of stroke or seizures.  Patient's lab work revealed positive SARS-CoV-2 RT-PCR.Patient's chest x-ray shows increased interstitial markings.  She is requiring 3 L/min of oxygen.     Review of systems:    In addition to the HPI above,   All other systems reviewed and are negative.    Past History of the following :    Past Medical History:  Diagnosis Date  . Anemia   . Anxiety   . Arthritis   . Chronic back pain   . COPD (chronic obstructive pulmonary disease) ([place])   . GERD (gastroesophageal reflux disease)   . Hypertension   . Hypothyroidism   . Reflux    no medications currently, asymptomatic  . Thyroid disease       Past Surgical History:  Procedure Laterality Date  . ABDOMINAL HYSTERECTOMY    . BIOPSY  10/08/2017   Procedure: BIOPSY;  Surgeon: Daneil Dolin, MD;  [facility];  Service: Endoscopy;;  gastric  . cataracts Bilateral   . COLONOSCOPY  2007   Dr. Gala Romney: normal rectum, diverticula   . COLONOSCOPY WITH PROPOFOL  N/A 11/15/2015   Dr. Gala Romney: grade II hemorrhoids, diverticulosis  . COLONOSCOPY WITH PROPOFOL N/A 10/08/2017   Procedure: COLONOSCOPY WITH PROPOFOL;  Surgeon: Daneil Dolin, MD;  Location: [facility];  Service: Endoscopy;  Laterality: N/A;  11:15am  . ESOPHAGOGASTRODUODENOSCOPY (EGD) WITH PROPOFOL N/A 10/08/2017   Procedure: ESOPHAGOGASTRODUODENOSCOPY (EGD) WITH PROPOFOL;  Surgeon: Daneil Dolin, MD;  Location: [facility];  Service: Endoscopy;  Laterality: N/A;  . FRACTURE SURGERY Right 2005   Wrist  . JOINT REPLACEMENT Left 2000   hip  . TOTAL HIP ARTHROPLASTY Left   . WRIST SURGERY Right       Social History:      Social History   Tobacco Use  . Smoking status: Former Smoker    Packs/day: 25.00    Years: 1.00    Pack years: 25.00    Types: Cigarettes    Quit date: 08/27/1998    Years since quitting: 21.1  .  Smokeless tobacco: Never Used  . Tobacco comment: quit in 2000  Substance Use Topics  . Alcohol use: No    Alcohol/week: 0.0 standard drinks       Family History :     Family History  Problem Relation Age of Onset  . Bone cancer Sister   . Pancreatic cancer Sister   . Pneumonia Mother   . Heart attack Father   . Diabetes Sister   . Dementia Sister   . Colon cancer Neg Hx       Home Medications:   Prior to Admission medications   Medication Sig Start Date End Date Taking? Authorizing Provider  ALPRAZolam Duanne Moron) 1 MG tablet Take 1 mg by mouth every 8 (eight) hours as needed for anxiety.  08/10/14  Yes [provider]  Fluticasone-Salmeterol (ADVAIR DISKUS) 250-50 MCG/DOSE AEPB Inhale 1 puff into the lungs 2 (two) times daily. 06/23/18 09/27/19 Yes Barton Dubois, MD  Fluticasone-Umeclidin-Vilant (TRELEGY ELLIPTA) 100-62.5-25 MCG/INH AEPB Inhale 1 puff into the lungs daily.  12/25/18  Yes Sinda Du, MD  levothyroxine (SYNTHROID) 50 MCG tablet Take 1 tablet by mouth daily. 09/11/18  Yes [provider]  oxyCODONE (ROXICODONE) 15 MG  immediate release tablet Take 15 mg by mouth 3 (three) times daily as needed for pain.    Yes [provider]  pantoprazole (PROTONIX) 40 MG tablet TAKE 1 TABLET BY MOUTH DAILY. Patient taking differently: Take 40 mg by mouth daily.  05/27/18  Yes Annitta Needs, NP  ipratropium-albuterol (DUONEB) 0.5-2.5 (3) MG/3ML SOLN Take 3 mLs by nebulization every 6 (six) hours as needed (shortness of breath or wheezing). 06/04/18 11/07/18  Manuella Ghazi, Pratik D, DO  potassium chloride (K-DUR) 10 MEQ tablet Take 1 tablet by mouth daily. 10/09/18   [provider]     Allergies:     Allergies  Allergen Reactions  . Penicillins Itching    Has patient had a PCN reaction causing immediate rash, facial/tongue/throat swelling, SOB or lightheadedness with hypotension: no Has patient had a PCN reaction causing severe rash involving mucus membranes or skin necrosis: No Has patient had a PCN reaction that required hospitalization No Has patient had a PCN reaction occurring within the last 10 years: No If all of the above answers are "NO", then may proceed with Cephalosporin use.   . Vicodin [Hydrocodone-Acetaminophen] Nausea And Vomiting     Physical Exam:   Vitals  Blood pressure 116/76, pulse (!) 113, temperature 99.7 F (37.6 C), temperature source Oral, resp. rate 18, height 5\' 5"  (1.651 m), weight 50.8 kg, SpO2 95 %.  1.  General: Appears in no acute distress  2. Psychiatric: Alert, oriented x3, intact insight and judgment  3. Neurologic: Cranial nerves II through XII grossly intact, no focal deficit noted  4. HEENMT:  Atraumatic normocephalic, extraocular muscles are intact  5. Respiratory : Decreased breath sounds at lung bases  6. Cardiovascular : S1-S2, regular, no murmur auscultated, no edema in the lower extremities  7. Gastrointestinal:  Abdomen is soft, nontender, no organomegaly     Data Review:    CBC Recent Labs  Lab 09/27/19 2057  WBC 7.4  HGB 9.4*  HCT  32.2*  PLT 313  MCV 90.7  MCH 26.5  MCHC 29.2*  RDW 14.3  LYMPHSABS 0.5*  MONOABS 0.8  EOSABS 0.0  BASOSABS 0.0   ------------------------------------------------------------------------------------------------------------------  Results for orders placed or performed during the hospital encounter of 09/27/19 (from the past 48 hour(s))  Basic metabolic panel  Status: Abnormal   Collection Time: 09/27/19  8:57 PM  Result Value Ref Range   Sodium 141 135 - 145 mmol/L   Potassium 3.9 3.5 - 5.1 mmol/L   Chloride 103 98 - 111 mmol/L   CO2 26 22 - 32 mmol/L   Glucose, Bld 117 (H) 70 - 99 mg/dL    Comment: Glucose reference range applies only to samples taken after fasting for at least 8 hours.   BUN 60 (H) 8 - 23 mg/dL   Creatinine, Ser 2.17 (H) 0.44 - 1.00 mg/dL   Calcium 8.4 (L) 8.9 - 10.3 mg/dL   GFR calc non Af Amer 22 (L) >60 mL/min   GFR calc Af Amer 25 (L) >60 mL/min   Anion gap 12 5 - 15    Comment: Performed at [facility], [address] [place], [place] [address]  CBC with Differential     Status: Abnormal   Collection Time: 09/27/19  8:57 PM  Result Value Ref Range   WBC 7.4 4.0 - 10.5 K/uL   RBC 3.55 (L) 3.87 - 5.11 MIL/uL   Hemoglobin 9.4 (L) 12.0 - 15.0 g/dL   HCT 32.2 (L) 36.0 - 46.0 %   MCV 90.7 80.0 - 100.0 fL   MCH 26.5 26.0 - 34.0 pg   MCHC 29.2 (L) 30.0 - 36.0 g/dL   RDW 14.3 11.5 - 15.5 %   Platelets 313 150 - 400 K/uL   nRBC 0.0 0.0 - 0.2 %   Neutrophils Relative % 80 %   Neutro Abs 5.9 1.7 - 7.7 K/uL   Lymphocytes Relative 7 %   Lymphs Abs 0.5 (L) 0.7 - 4.0 K/uL   Monocytes Relative 11 %   Monocytes Absolute 0.8 0.1 - 1.0 K/uL   Eosinophils Relative 0 %   Eosinophils Absolute 0.0 0.0 - 0.5 K/uL   Basophils Relative 0 %   Basophils Absolute 0.0 0.0 - 0.1 K/uL   Immature Granulocytes 2 %   Abs Immature Granulocytes 0.12 (H) 0.00 - 0.07 K/uL    Comment: Performed at [facility], [address] [place], [place] [address]  Respiratory Panel by  RT PCR (Flu A&B, Covid) - Nasopharyngeal Swab     Status: Abnormal   Collection Time: 09/27/19  9:52 PM   Specimen: Nasopharyngeal Swab  Result Value Ref Range   SARS Coronavirus 2 by RT PCR POSITIVE (A) NEGATIVE    Comment: RESULT CALLED TO, READ BACK BY AND VERIFIED WITH: K NICKELS AT 2306 ON 09/27/2019 BY MOSLEY,J (NOTE) SARS-CoV-2 target nucleic acids are DETECTED. SARS-CoV-2 RNA is generally detectable in upper respiratory specimens  during the acute phase of infection. Positive results are indicative of the presence of the identified virus, but do not rule out bacterial infection or co-infection with other pathogens not detected by the test. Clinical correlation with patient history and other diagnostic information is necessary to determine patient infection status. The expected result is Negative. Fact Sheet for Patients:  PinkCheek.be Fact Sheet for Healthcare Providers: GravelBags.it This test is not yet approved or cleared by the [place] FDA and  has been authorized for detection and/or diagnosis of SARS-CoV-2 by FDA under an Emergency Use Authorization (EUA).  This EUA will remain in effect (meaning this test can be u sed) for the duration of  the COVID-19 declaration under Section 564(b)(1) of the Act, 21 U.S.C. section 360bbb-3(b)(1), unless the authorization is terminated or revoked sooner.    Influenza A by PCR NEGATIVE NEGATIVE  Influenza B by PCR NEGATIVE NEGATIVE    Comment: (NOTE) The Xpert Xpress SARS-CoV-2/FLU/RSV assay is intended as an aid in  the diagnosis of influenza from Nasopharyngeal swab specimens and  should not be used as a sole basis for treatment. Nasal washings and  aspirates are unacceptable for Xpert Xpress SARS-CoV-2/FLU/RSV  testing. Fact Sheet for Patients: PinkCheek.be Fact Sheet for Healthcare  Providers: GravelBags.it This test is not yet approved or cleared by the [place] FDA and  has been authorized for detection and/or diagnosis of SARS-CoV-2 by  FDA under an Emergency Use Authorization (EUA). This EUA will remain  in effect (meaning this test can be used) for the duration of the  Covid-19 declaration under Section 564(b)(1) of the Act, 21  U.S.C. section 360bbb-3(b)(1), unless the authorization is  terminated or revoked. Performed at [facility], [address] [place], [place] [address]     Chemistries  Recent Labs  Lab 09/27/19 2057  NA 141  K 3.9  CL 103  CO2 26  GLUCOSE 117*  BUN 60*  CREATININE 2.17*  CALCIUM 8.4*   ------------------------------------------------------------------------------------------------------------------  ------------------------------------------------------------------------------------------------------------------ GFR: Estimated Creatinine Clearance: 18.5 mL/min (A) (by C-G formula based on SCr of 2.17 mg/dL (H)). Liver Function Tests: No results for input(s): AST, ALT, ALKPHOS, BILITOT, PROT, ALBUMIN in the last 168 hours. No results for input(s): LIPASE, AMYLASE in the last 168 hours. No results for input(s): AMMONIA in the last 168 hours. Coagulation Profile: No results for input(s): INR, PROTIME in the last 168 hours. Cardiac Enzymes: No results for input(s): CKTOTAL, CKMB, CKMBINDEX, TROPONINI in the last 168 hours. BNP (last 3 results) No results for input(s): PROBNP in the last 8760 hours. HbA1C: No results for input(s): HGBA1C in the last 72 hours. CBG: No results for input(s): GLUCAP in the last 168 hours. Lipid Profile: No results for input(s): CHOL, HDL, LDLCALC, TRIG, CHOLHDL, LDLDIRECT in the last 72 hours. Thyroid Function Tests: No results for input(s): TSH, T4TOTAL, FREET4, T3FREE, THYROIDAB in the last 72 hours. Anemia Panel: No results for input(s): VITAMINB12,  FOLATE, FERRITIN, TIBC, IRON, RETICCTPCT in the last 72 hours.  --------------------------------------------------------------------------------------------------------------- Urine analysis:    Component Value Date/Time   COLORURINE AMBER (A) 06/21/2018 1450   APPEARANCEUR CLOUDY (A) 06/21/2018 1450   LABSPEC 1.020 06/21/2018 1450   PHURINE 5.0 06/21/2018 1450   GLUCOSEU NEGATIVE 06/21/2018 1450   HGBUR NEGATIVE 06/21/2018 1450   BILIRUBINUR NEGATIVE 06/21/2018 1450   KETONESUR NEGATIVE 06/21/2018 1450   PROTEINUR 30 (A) 06/21/2018 1450   UROBILINOGEN 0.2 03/25/2008 1702   NITRITE NEGATIVE 06/21/2018 1450   LEUKOCYTESUR TRACE (A) 06/21/2018 1450      Imaging Results:    DG Chest 2 View  Result Date: 09/27/2019 CLINICAL DATA:  Cough. EXAM: CHEST - 2 VIEW COMPARISON:  November 06, 2018 FINDINGS: The lungs are hyperinflated. Moderate severity diffusely increased interstitial lung markings are seen. This is similar in appearance when compared to the prior study. Mild, predominant stable right apical scarring and/or atelectasis is seen. Mild areas of atelectasis and/or infiltrate are seen along the periphery of the bilateral lung bases. This represents a new finding when compared to the prior study. There is no evidence of a pleural effusion or pneumothorax. The heart size and mediastinal contours are within normal limits. There is moderate severity calcification of the aortic arch. Degenerative changes seen throughout the thoracic spine. IMPRESSION: 1. Mild bibasilar atelectasis and/or infiltrate, new when compared to the prior study. 2. Chronic interstitial lung disease  with stable right apical scarring and/or atelectasis. Electronically Signed   By: Virgina Norfolk M.D.   On: 09/27/2019 21:11    My personal review of EKG: Rhythm NSR, no ST-T changes   Assessment & Plan:    Active Problems:   Pneumonia due to COVID-19 virus   1. Pneumonia due to COVID-19 virus-patient presenting with  acute hypoxic respiratory failure, requiring 3 L per night of oxygen, patient is on 2 L of oxygen at home.  Chest x-ray shows bilateral markings.  We will start her on remdesivir per pharmacy consultation, Decadron 6 mg p.o. daily.  Inflammatory markers including LDH, ferritin, D-dimer have been obtained in the ED.  We will follow the results.  Also order inflammatory markers for a.m. 2. COPD exacerbation-patient has been started on Decadron as above.  Will also start albuterol inhaler 6 puffs every 6 hours as needed. 3. Acute kidney injury on CKD stage IIIb-patient's BUN/creatinine is 60/2.17, her previous creatinine was 1.37 in June 2020.  Likely from poor p.o. intake versus new baseline.  Patient given fluid bolus in the ED.  Will be judicious with giving IV fluids as patient has Covid pneumonia.  Follow BMP in a.m. 4. Hypothyroidism-continue Synthroid 5. Chronic pain syndrome-continue oxycodone 50 mg p.o. 3 times daily as needed for pain. 6. Anxiety-continue Xanax 1 mg 3 times daily as needed.   DVT Prophylaxis-   Heparin  AM Labs Ordered, also please review Full Orders  Family Communication: Admission, patients condition and plan of care including tests being ordered have been discussed with the patient  who indicate understanding and agree with the plan and Code Status.  Code Status: Full code  Admission status: Observation/Inpatient :The appropriate admission status for this patient is INPATIENT. Inpatient status is judged to be reasonable and necessary in order to provide the required intensity of service to ensure the patient's safety. The patient's presenting symptoms, physical exam findings, and initial radiographic and laboratory data in the context of their chronic comorbidities is felt to place them at high risk for further clinical deterioration. Furthermore, it is not anticipated that the patient will be medically stable for discharge from the hospital within 2 midnights of admission.  The following factors support the admission status of inpatient.     The patient's presenting symptoms include shortness of breath. The worrisome physical exam findings include acute hypoxic respiratory failure. The initial radiographic and laboratory data are worrisome because of Covid 19 viral pneumonia. The chronic co-morbidities include COPD.       * I certify that at the point of admission it is my clinical judgment that the patient will require inpatient hospital care spanning beyond 2 midnights from the point of admission due to high intensity of service, high risk for further deterioration and high frequency of surveillance required.*  Time spent in minutes : 60 min   Wallie Lagrand S Abbygayle Helfand M.D

## 2019-09-29 DIAGNOSIS — U071 COVID-19: Secondary | ICD-10-CM | POA: Diagnosis not present

## 2019-09-29 DIAGNOSIS — N179 Acute kidney failure, unspecified: Secondary | ICD-10-CM | POA: Diagnosis not present

## 2019-09-29 DIAGNOSIS — J9621 Acute and chronic respiratory failure with hypoxia: Secondary | ICD-10-CM | POA: Diagnosis not present

## 2019-09-29 DIAGNOSIS — E039 Hypothyroidism, unspecified: Secondary | ICD-10-CM

## 2019-09-29 DIAGNOSIS — J441 Chronic obstructive pulmonary disease with (acute) exacerbation: Secondary | ICD-10-CM | POA: Diagnosis not present

## 2019-09-29 DIAGNOSIS — K219 Gastro-esophageal reflux disease without esophagitis: Secondary | ICD-10-CM

## 2019-09-29 DIAGNOSIS — G894 Chronic pain syndrome: Secondary | ICD-10-CM

## 2019-09-29 DIAGNOSIS — N189 Chronic kidney disease, unspecified: Secondary | ICD-10-CM

## 2019-09-29 LAB — COMPREHENSIVE METABOLIC PANEL
ALT: 10 U/L (ref 0–44)
AST: 19 U/L (ref 15–41)
Albumin: 2.5 g/dL — ABNORMAL LOW (ref 3.5–5.0)
Alkaline Phosphatase: 69 U/L (ref 38–126)
Anion gap: 8 (ref 5–15)
BUN: 48 mg/dL — ABNORMAL HIGH (ref 8–23)
CO2: 27 mmol/L (ref 22–32)
Calcium: 8.2 mg/dL — ABNORMAL LOW (ref 8.9–10.3)
Chloride: 108 mmol/L (ref 98–111)
Creatinine, Ser: 1.34 mg/dL — ABNORMAL HIGH (ref 0.44–1.00)
GFR calc Af Amer: 45 mL/min — ABNORMAL LOW (ref >60)
GFR calc non Af Amer: 39 mL/min — ABNORMAL LOW (ref >60)
Glucose, Bld: 178 mg/dL — ABNORMAL HIGH (ref 70–99)
Potassium: 4.5 mmol/L (ref 3.5–5.1)
Sodium: 143 mmol/L (ref 135–145)
Total Bilirubin: 0.4 mg/dL (ref 0.3–1.2)
Total Protein: 6 g/dL — ABNORMAL LOW (ref 6.5–8.1)

## 2019-09-29 LAB — CBC WITH DIFFERENTIAL/PLATELET
Abs Immature Granulocytes: 0.25 10*3/uL — ABNORMAL HIGH (ref 0.00–0.07)
Band Neutrophils: 4 %
Basophils Absolute: 0 10*3/uL (ref 0.0–0.1)
Basophils Relative: 0 %
Eosinophils Absolute: 0 10*3/uL (ref 0.0–0.5)
Eosinophils Relative: 0 %
HCT: 30.6 % — ABNORMAL LOW (ref 36.0–46.0)
Hemoglobin: 8.5 g/dL — ABNORMAL LOW (ref 12.0–15.0)
Lymphocytes Relative: 6 %
Lymphs Abs: 0.7 10*3/uL (ref 0.7–4.0)
MCH: 25.9 pg — ABNORMAL LOW (ref 26.0–34.0)
MCHC: 27.8 g/dL — ABNORMAL LOW (ref 30.0–36.0)
MCV: 93.3 fL (ref 80.0–100.0)
Metamyelocytes Relative: 2 %
Monocytes Absolute: 1 10*3/uL (ref 0.1–1.0)
Monocytes Relative: 8 %
Neutro Abs: 10.4 10*3/uL — ABNORMAL HIGH (ref 1.7–7.7)
Neutrophils Relative %: 80 %
Platelets: 300 10*3/uL (ref 150–400)
RBC: 3.28 MIL/uL — ABNORMAL LOW (ref 3.87–5.11)
RDW: 14.3 % (ref 11.5–15.5)
WBC: 12.4 10*3/uL — ABNORMAL HIGH (ref 4.0–10.5)
nRBC: 0 % (ref 0.0–0.2)

## 2019-09-29 LAB — C-REACTIVE PROTEIN: CRP: 12.6 mg/dL — ABNORMAL HIGH (ref <1.0)

## 2019-09-29 LAB — D-DIMER, QUANTITATIVE: D-Dimer, Quant: 2.72 ug/mL-FEU — ABNORMAL HIGH (ref 0.00–0.50)

## 2019-09-29 LAB — FERRITIN: Ferritin: 228 ng/mL (ref 11–307)

## 2019-09-29 MED ORDER — IPRATROPIUM-ALBUTEROL 20-100 MCG/ACT IN AERS
1.0000 | INHALATION_SPRAY | Freq: Four times a day (QID) | RESPIRATORY_TRACT | Status: DC
  Administered 2019-09-30 – 2019-10-01 (×4): 1 via RESPIRATORY_TRACT

## 2019-09-29 MED ORDER — DEXTROMETHORPHAN POLISTIREX ER 30 MG/5ML PO SUER: 15.0000 mg | Freq: Four times a day (QID) | ORAL | Status: DC | PRN

## 2019-09-29 MED ORDER — IPRATROPIUM-ALBUTEROL 20-100 MCG/ACT IN AERS
1.0000 | INHALATION_SPRAY | Freq: Four times a day (QID) | RESPIRATORY_TRACT | Status: DC
  Administered 2019-09-29: 1 via RESPIRATORY_TRACT

## 2019-09-29 NOTE — Progress Notes (Signed)
PROGRESS NOTE    Marilyn Solomon  ZOX:[id number] DOB: 06-09-1945 DOA: 09/27/2019 PCP: Lucianne Lei, MD   Chief Complaint  Patient presents with  . Cough    Brief Narrative: As per H&P written by Dr. Darrick Meigs on 09/28/2019 73 y.o. female, with history of COPD, chronic back pain, GERD, anxiety,  hypothyroidism who came to hospital with complaints of body aches and cough.  Patient states that she has been sick since Wednesday.  She also complains of chest pain with coughing and coughing up yellow phlegm.  She denies shortness of breath.  She is on home oxygen at 2 L/min.  In the ED her oxygen saturations found to be 79% on room air. She had nausea and vomiting few days ago but is not vomiting anymore. She denies abdominal pain or dysuria No previous history of stroke or seizures.  Patient's lab work revealed positive SARS-CoV-2 RT-PCR.Patient's chest x-ray shows increased interstitial markings.  She is requiring 3 L/min of oxygen.   Assessment & Plan: Acute on chronic respiratory failure with hypoxia in the setting of COVID-19 pneumonia -Patient chronically on 2 L of nasal cannula supplementation and requiring 3 L on presentation -Still feeling short of breath and complaining of productive coughing spells. -Mild tachypnea on exertion appreciated. -Continue mucolytic's/antitussives -Continue steroids and remdesivir; day 2 out of 5 -Continue vitamin C and zinc -Continue to follow inflammatory markers -Continue supportive care. -Wean oxygen supplementation down to baseline as tolerated. -Procalcitonin level is low so we will avoid the use of antibiotics currently. -LFTs stable.  Acute kidney injury and chronic kidney disease a stage IIIb -Baseline creatinine around 1.37 in June 2020 -Appears to be prerenal azotemia in nature -Creatinine significantly improve after fluid resuscitation and down to 1.34 currently. -Patient advised to maintain adequate oral hydration -We will stop IV  fluids. -Continue to follow renal function trend.  Hypothyroidism -Continue Synthroid.  COPD -Currently no wheezing -Continue steroids as mentioned in problem #1. -Continue oxygen supplementation. -Continue Dulera and as needed bronchodilators.  Chronic pain syndrome -Continue oxycodone 50 mg 3 times a day as needed for pain (patient's home regimen) -Patient reported good control in her pain currently.  Anxiety -Continue as needed Xanax. -Stable mood at this time.  GERD -Continue PPI  DVT prophylaxis: Heparin Code Status: Full code Family Communication: No family at bedside. Disposition:   Status is: Inpatient  Dispo: The patient is from: Home              Anticipated d/c is to: Home              Anticipated d/c date is: 10/02/19              Patient currently will remains inpatient, continue IV steroids and remdesivir, continue mucolytic/antitussive regimen.  Continue weaning oxygen supplementation to baseline and assessed capacity to perform activities prior to discharge.  Due to ongoing shortness of breath requirement of higher level of oxygen patient is now medically stable for discharge yet.    Consultants:   None  Procedures:  -See below for x-ray reports   Antimicrobials:  -Remdesivir and steroids (09/28/19)   Subjective: Complaining of productive coughing spells, shortness of breath with minimal activity even she has noticed improvement in comparison to admission) and requiring higher level of oxygen supplementation at baseline (normally on 2 L, using 3 L currently).  No nausea or vomiting, no chest pain.  Objective: Vitals:   09/29/19 0559 09/29/19 0601 09/29/19 0829 09/29/19 1400  BP: 106/65  122/65  132/66  Pulse: 79 86  90  Resp: 20 20  (!) 24  Temp: 97.6 F (36.4 C) 97.7 F (36.5 C)  97.7 F (36.5 C)  TempSrc: Oral Oral  Oral  SpO2: 100% 100% 99% 99%  Weight:      Height:        Intake/Output Summary (Last 24 hours) at 09/29/2019 1712 Last  data filed at 09/29/2019 1505 Gross per 24 hour  Intake 1784.94 ml  Output -  Net 1784.94 ml   Filed Weights   09/27/19 1951  Weight: 50.8 kg    Examination:  General exam: Appears calm and comfortable; reports feeling better.  Still short of breath on exertion and using 3 L nasal cannula supplementation (which is higher than baseline for her).  Main complaint currently are productive coughing spells.  No fever, no nausea, no vomiting. Respiratory system: Bilateral rhonchi right, no using accessory muscles.  No wheezing Cardiovascular system: S1 & S2 heard, RRR. No JVD, murmurs, rubs, gallops or clicks. No pedal edema. Gastrointestinal system: Abdomen is nondistended, soft and nontender. No organomegaly or masses felt. Normal bowel sounds heard. Central nervous system: Alert and oriented. No focal neurological deficits. Extremities: Symmetric 5 x 5 power.  No cyanosis or clubbing. Skin: No rashes, no petechiae. Psychiatry: Judgement and insight appear normal. Mood & affect appropriate.     Data Reviewed: I have personally reviewed following labs and imaging studies  CBC: Recent Labs  Lab 09/27/19 2057 09/29/19 0440  WBC 7.4 12.4*  NEUTROABS 5.9 10.4*  HGB 9.4* 8.5*  HCT 32.2* 30.6*  MCV 90.7 93.3  PLT 313 259    Basic Metabolic Panel: Recent Labs  Lab 09/27/19 2057 09/29/19 0440  NA 141 143  K 3.9 4.5  CL 103 108  CO2 26 27  GLUCOSE 117* 178*  BUN 60* 48*  CREATININE 2.17* 1.34*  CALCIUM 8.4* 8.2*    GFR: Estimated Creatinine Clearance: 30 mL/min (A) (by C-G formula based on SCr of 1.34 mg/dL (H)).  Liver Function Tests: Recent Labs  Lab 09/27/19 2329 09/29/19 0440  AST 21 19  ALT 10 10  ALKPHOS 82 69  BILITOT 0.2* 0.4  PROT 6.7 6.0*  ALBUMIN 2.7* 2.5*    CBG: No results for input(s): GLUCAP in the last 168 hours.   Recent Results (from the past 240 hour(s))  Respiratory Panel by RT PCR (Flu A&B, Covid) - Nasopharyngeal Swab     Status:  Abnormal   Collection Time: 09/27/19  9:52 PM   Specimen: Nasopharyngeal Swab  Result Value Ref Range Status   SARS Coronavirus 2 by RT PCR POSITIVE (A) NEGATIVE Final    Comment: RESULT CALLED TO, READ BACK BY AND VERIFIED WITH: K NICKELS AT 2306 ON 09/27/2019 BY MOSLEY,J (NOTE) SARS-CoV-2 target nucleic acids are DETECTED. SARS-CoV-2 RNA is generally detectable in upper respiratory specimens  during the acute phase of infection. Positive results are indicative of the presence of the identified virus, but do not rule out bacterial infection or co-infection with other pathogens not detected by the test. Clinical correlation with patient history and other diagnostic information is necessary to determine patient infection status. The expected result is Negative. Fact Sheet for Patients:  PinkCheek.be Fact Sheet for Healthcare Providers: GravelBags.it This test is not yet approved or cleared by the [place] FDA and  has been authorized for detection and/or diagnosis of SARS-CoV-2 by FDA under an Emergency Use Authorization (EUA).  This EUA will remain in effect (  meaning this test can be u sed) for the duration of  the COVID-19 declaration under Section 564(b)(1) of the Act, 21 U.S.C. section 360bbb-3(b)(1), unless the authorization is terminated or revoked sooner.    Influenza A by PCR NEGATIVE NEGATIVE Final   Influenza B by PCR NEGATIVE NEGATIVE Final    Comment: (NOTE) The Xpert Xpress SARS-CoV-2/FLU/RSV assay is intended as an aid in  the diagnosis of influenza from Nasopharyngeal swab specimens and  should not be used as a sole basis for treatment. Nasal washings and  aspirates are unacceptable for Xpert Xpress SARS-CoV-2/FLU/RSV  testing. Fact Sheet for Patients: PinkCheek.be Fact Sheet for Healthcare Providers: GravelBags.it This test is not yet approved or  cleared by the [place] FDA and  has been authorized for detection and/or diagnosis of SARS-CoV-2 by  FDA under an Emergency Use Authorization (EUA). This EUA will remain  in effect (meaning this test can be used) for the duration of the  Covid-19 declaration under Section 564(b)(1) of the Act, 21  U.S.C. section 360bbb-3(b)(1), unless the authorization is  terminated or revoked. Performed at [facility], [address] [place], [place] [address]   Blood Culture (routine x 2)     Status: None (Preliminary result)   Collection Time: 09/27/19 11:29 PM   Specimen: BLOOD LEFT ARM  Result Value Ref Range Status   Specimen Description BLOOD LEFT ARM  Final   Special Requests   Final    BOTTLES DRAWN AEROBIC ONLY Blood Culture adequate volume   Culture   Final    NO GROWTH 2 DAYS Performed at [facility], [address] [place], [place] [address]    Report Status PENDING  Incomplete  Blood Culture (routine x 2)     Status: None (Preliminary result)   Collection Time: 09/27/19 11:30 PM   Specimen: BLOOD RIGHT FOREARM  Result Value Ref Range Status   Specimen Description BLOOD RIGHT FOREARM  Final   Special Requests   Final    BOTTLES DRAWN AEROBIC AND ANAEROBIC Blood Culture adequate volume   Culture   Final    NO GROWTH 2 DAYS Performed at [facility], [address] [place], [place] [address]    Report Status PENDING  Incomplete     Radiology Studies: DG Chest 2 View  Result Date: 09/27/2019 CLINICAL DATA:  Cough. EXAM: CHEST - 2 VIEW COMPARISON:  November 06, 2018 FINDINGS: The lungs are hyperinflated. Moderate severity diffusely increased interstitial lung markings are seen. This is similar in appearance when compared to the prior study. Mild, predominant stable right apical scarring and/or atelectasis is seen. Mild areas of atelectasis and/or infiltrate are seen along the periphery of the bilateral lung bases. This represents a new finding when compared to the prior study. There  is no evidence of a pleural effusion or pneumothorax. The heart size and mediastinal contours are within normal limits. There is moderate severity calcification of the aortic arch. Degenerative changes seen throughout the thoracic spine. IMPRESSION: 1. Mild bibasilar atelectasis and/or infiltrate, new when compared to the prior study. 2. Chronic interstitial lung disease with stable right apical scarring and/or atelectasis. Electronically Signed   By: Virgina Norfolk M.D.   On: 09/27/2019 21:11    Scheduled Meds: . vitamin C  500 mg Oral Daily  . dexamethasone (DECADRON) injection  8 mg Intravenous Q24H  . heparin  5,000 Units Subcutaneous Q8H  . Ipratropium-Albuterol  1 puff Inhalation Q6H  . levothyroxine  50 mcg Oral Daily  .  mometasone-formoterol  2 puff Inhalation BID  . pantoprazole  40 mg Oral Daily  . zinc sulfate  220 mg Oral Daily   Continuous Infusions: . sodium chloride 50 mL/hr at 09/29/19 1235  . remdesivir 100 mg in NS 100 mL Stopped (09/29/19 1016)     LOS: 1 day    Time spent: 35 minutes.   Barton Dubois, MD [facility]   To contact the attending provider between 7A-7P or the covering provider during after hours 7P-7A, please log into the web site [email or web address] and access using universal [place] password for that web site. If you do not have the password, please call the hospital operator.  09/29/2019, 5:12 PM

## 2019-09-29 NOTE — Plan of Care (Signed)
  Problem: Respiratory: Goal: Ability to maintain adequate ventilation will improve Outcome: Progressing   Problem: Respiratory: Goal: Ability to maintain a clear airway will improve Outcome: Progressing   Problem: Activity: Goal: Ability to tolerate increased activity will improve Outcome: Progressing

## 2019-09-30 DIAGNOSIS — U071 COVID-19: Secondary | ICD-10-CM | POA: Diagnosis not present

## 2019-09-30 DIAGNOSIS — N179 Acute kidney failure, unspecified: Secondary | ICD-10-CM | POA: Diagnosis not present

## 2019-09-30 DIAGNOSIS — J441 Chronic obstructive pulmonary disease with (acute) exacerbation: Secondary | ICD-10-CM | POA: Diagnosis not present

## 2019-09-30 DIAGNOSIS — J9621 Acute and chronic respiratory failure with hypoxia: Secondary | ICD-10-CM | POA: Diagnosis not present

## 2019-09-30 LAB — CBC WITH DIFFERENTIAL/PLATELET
Band Neutrophils: 16 %
Basophils Absolute: 0 10*3/uL (ref 0.0–0.1)
Basophils Relative: 0 %
Eosinophils Absolute: 0 10*3/uL (ref 0.0–0.5)
Eosinophils Relative: 0 %
HCT: 30.4 % — ABNORMAL LOW (ref 36.0–46.0)
Hemoglobin: 8.8 g/dL — ABNORMAL LOW (ref 12.0–15.0)
Lymphocytes Relative: 6 %
Lymphs Abs: 1 10*3/uL (ref 0.7–4.0)
MCH: 26.2 pg (ref 26.0–34.0)
MCHC: 28.9 g/dL — ABNORMAL LOW (ref 30.0–36.0)
MCV: 90.5 fL (ref 80.0–100.0)
Metamyelocytes Relative: 1 %
Monocytes Absolute: 0.3 10*3/uL (ref 0.1–1.0)
Monocytes Relative: 2 %
Neutro Abs: 15.8 10*3/uL — ABNORMAL HIGH (ref 1.7–7.7)
Neutrophils Relative %: 75 %
Platelets: 338 10*3/uL (ref 150–400)
RBC: 3.36 MIL/uL — ABNORMAL LOW (ref 3.87–5.11)
RDW: 14.6 % (ref 11.5–15.5)
WBC: 17.4 10*3/uL — ABNORMAL HIGH (ref 4.0–10.5)
nRBC: 0 % (ref 0.0–0.2)

## 2019-09-30 LAB — COMPREHENSIVE METABOLIC PANEL
ALT: 11 U/L (ref 0–44)
AST: 18 U/L (ref 15–41)
Albumin: 2.3 g/dL — ABNORMAL LOW (ref 3.5–5.0)
Alkaline Phosphatase: 63 U/L (ref 38–126)
Anion gap: 8 (ref 5–15)
BUN: 33 mg/dL — ABNORMAL HIGH (ref 8–23)
CO2: 27 mmol/L (ref 22–32)
Calcium: 8.6 mg/dL — ABNORMAL LOW (ref 8.9–10.3)
Chloride: 109 mmol/L (ref 98–111)
Creatinine, Ser: 0.96 mg/dL (ref 0.44–1.00)
GFR calc Af Amer: >60 mL/min (ref >60)
GFR calc non Af Amer: 59 mL/min — ABNORMAL LOW (ref >60)
Glucose, Bld: 145 mg/dL — ABNORMAL HIGH (ref 70–99)
Potassium: 4.3 mmol/L (ref 3.5–5.1)
Sodium: 144 mmol/L (ref 135–145)
Total Bilirubin: 0.3 mg/dL (ref 0.3–1.2)
Total Protein: 6 g/dL — ABNORMAL LOW (ref 6.5–8.1)

## 2019-09-30 LAB — FERRITIN: Ferritin: 200 ng/mL (ref 11–307)

## 2019-09-30 LAB — D-DIMER, QUANTITATIVE: D-Dimer, Quant: 2.26 ug/mL-FEU — ABNORMAL HIGH (ref 0.00–0.50)

## 2019-09-30 LAB — C-REACTIVE PROTEIN: CRP: 14.7 mg/dL — ABNORMAL HIGH (ref <1.0)

## 2019-09-30 MED ORDER — DEXAMETHASONE 4 MG PO TABS
6.0000 mg | ORAL_TABLET | Freq: Every day | ORAL | Status: DC
  Administered 2019-10-01: 11:00:00 6 mg via ORAL
  Filled 2019-09-30: qty 2

## 2019-09-30 NOTE — Progress Notes (Signed)
PROGRESS NOTE    Marilyn Solomon  XBD:[id number] DOB: 08-25-1945 DOA: 09/27/2019 PCP: Lucianne Lei, MD   Chief Complaint  Patient presents with  . Cough    Brief Narrative: As per H&P written by Dr. Darrick Meigs on 09/28/2019 74 y.o. female, with history of COPD, chronic back pain, GERD, anxiety,  hypothyroidism who came to hospital with complaints of body aches and cough.  Patient states that she has been sick since Wednesday.  She also complains of chest pain with coughing and coughing up yellow phlegm.  She denies shortness of breath.  She is on home oxygen at 2 L/min.  In the ED her oxygen saturations found to be 79% on room air. She had nausea and vomiting few days ago but is not vomiting anymore. She denies abdominal pain or dysuria No previous history of stroke or seizures.  Patient's lab work revealed positive SARS-CoV-2 RT-PCR.Patient's chest x-ray shows increased interstitial markings.    Assessment & Plan: Acute on chronic respiratory failure with hypoxia in the setting of COVID-19 pneumonia -Patient chronically on 2 L of nasal cannula supplementation; was requiring 3 L on presentation.  Feeling better and today will attempt keeping her on 2 L through nasal cannula for the rest of the shift. -Still feeling short of breath and complaining of productive coughing spells. -Mild tachypnea on exertion appreciated. -Continue mucolytic's/antitussives -Continue steroids and remdesivir; day 3 out of 5 -Continue vitamin C and zinc -Continue to follow inflammatory markers -Continue supportive care. -Wean oxygen supplementation down to baseline as tolerated. -Procalcitonin level is low so we will avoid the use of antibiotics currently. -LFTs stable.  Acute kidney injury and chronic kidney disease a stage IIIb -Baseline creatinine around 1.37 in June 2020 -Appears to be prerenal azotemia in nature -Creatinine significantly improve after fluid resuscitation and down to 1.34 currently. -IV  fluids discontinued 24 hours ago. -So far tolerating adequate hydration by mouth. -Continue to follow renal function trend.  Hypothyroidism -Continue Synthroid.  COPD -Currently no wheezing -Continue steroids as mentioned in problem #1. -Continue oxygen supplementation. -Continue Dulera and as needed bronchodilators.  Chronic pain syndrome -Continue oxycodone 50 mg 3 times a day as needed for pain (patient's home regimen) -Patient reported good control in her pain currently.  Anxiety -Continue as needed Xanax. -Stable mood at this time.  GERD -Continue PPI  DVT prophylaxis: Heparin Code Status: Full code Family Communication: No family at bedside. Disposition:   Status is: Inpatient  Dispo: The patient is from: Home              Anticipated d/c is to: Home              Anticipated d/c date is: 10/01/19              Patient currently reaching medical stability; expressed breathing is better, no chest pain, no nausea, no vomiting.  Will assess dropping oxygen supplementation to her chronic 2 L through nasal cannula and assess stability.  Continue remdesivir and steroids.  If she remains stable will discharge home on 10/01/2019 with arranged outpatient remdesivir infusion to complete the last dose.    Consultants:   None  Procedures:  -See below for x-ray reports   Antimicrobials:  -Remdesivir and steroids (09/28/19)   Subjective: Reports feeling better; still short of breath on exertion and with intermittent coughing spells.  No fever, no chest pain, no nausea, no vomiting.  Oxygen supplementation has been dropped this morning down to her chronic 2 L  nasal cannula. Almost speaking in full sentences without complaints.  Objective: Vitals:   09/29/19 2157 09/30/19 0559 09/30/19 0820 09/30/19 1402  BP: 139/65 135/66  (!) 158/80  Pulse: 97 89  96  Resp: (!) 22 20  (!) 28  Temp: 98.7 F (37.1 C) 97.8 F (36.6 C)  98 F (36.7 C)  TempSrc: Oral     SpO2: 98% 97% 95%  99%  Weight:      Height:       No intake or output data in the 24 hours ending 09/30/19 1539 Filed Weights   09/27/19 1951  Weight: 50.8 kg    Examination: General exam: Alert, awake, oriented x 3, afebrile, no nausea or vomiting.  Still feeling short of breath and having intermittent coughing spells (but expressed to be improving).  Oxygen saturation dropped this morning down to her chronic 2 L nasal cannula supplementation and will assess for stability. Respiratory system: Bilateral rhonchi right, no wheezing, no crackles; no using accessory muscles.  Normal respiratory effort. Cardiovascular system:RRR. No murmurs, rubs, gallops. Gastrointestinal system: Abdomen is nondistended, soft and nontender. No organomegaly or masses felt. Normal bowel sounds heard. Central nervous system: Alert and oriented. No focal neurological deficits. Extremities: No no cyanosis or clubbing. Skin: No rashes, no petechiae. Psychiatry: Judgement and insight appear normal. Mood & affect appropriate.    Data Reviewed: I have personally reviewed following labs and imaging studies  CBC: Recent Labs  Lab 09/27/19 2057 09/29/19 0440 09/30/19 0438  WBC 7.4 12.4* 17.4*  NEUTROABS 5.9 10.4* 15.8*  HGB 9.4* 8.5* 8.8*  HCT 32.2* 30.6* 30.4*  MCV 90.7 93.3 90.5  PLT 313 300 846    Basic Metabolic Panel: Recent Labs  Lab 09/27/19 2057 09/29/19 0440 09/30/19 0438  NA 141 143 144  K 3.9 4.5 4.3  CL 103 108 109  CO2 26 27 27   GLUCOSE 117* 178* 145*  BUN 60* 48* 33*  CREATININE 2.17* 1.34* 0.96  CALCIUM 8.4* 8.2* 8.6*    GFR: Estimated Creatinine Clearance: 41.9 mL/min (by C-G formula based on SCr of 0.96 mg/dL).  Liver Function Tests: Recent Labs  Lab 09/27/19 2329 09/29/19 0440 09/30/19 0438  AST 21 19 18   ALT 10 10 11   ALKPHOS 82 69 63  BILITOT 0.2* 0.4 0.3  PROT 6.7 6.0* 6.0*  ALBUMIN 2.7* 2.5* 2.3*    CBG: No results for input(s): GLUCAP in the last 168 hours.   Recent  Results (from the past 240 hour(s))  Respiratory Panel by RT PCR (Flu A&B, Covid) - Nasopharyngeal Swab     Status: Abnormal   Collection Time: 09/27/19  9:52 PM   Specimen: Nasopharyngeal Swab  Result Value Ref Range Status   SARS Coronavirus 2 by RT PCR POSITIVE (A) NEGATIVE Final    Comment: RESULT CALLED TO, READ BACK BY AND VERIFIED WITH: K NICKELS AT 2306 ON 09/27/2019 BY MOSLEY,J (NOTE) SARS-CoV-2 target nucleic acids are DETECTED. SARS-CoV-2 RNA is generally detectable in upper respiratory specimens  during the acute phase of infection. Positive results are indicative of the presence of the identified virus, but do not rule out bacterial infection or co-infection with other pathogens not detected by the test. Clinical correlation with patient history and other diagnostic information is necessary to determine patient infection status. The expected result is Negative. Fact Sheet for Patients:  PinkCheek.be Fact Sheet for Healthcare Providers: GravelBags.it This test is not yet approved or cleared by the [place] and  has been authorized  for detection and/or diagnosis of SARS-CoV-2 by FDA under an Emergency Use Authorization (EUA).  This EUA will remain in effect (meaning this test can be u sed) for the duration of  the COVID-19 declaration under Section 564(b)(1) of the Act, 21 U.S.C. section 360bbb-3(b)(1), unless the authorization is terminated or revoked sooner.    Influenza A by PCR NEGATIVE NEGATIVE Final   Influenza B by PCR NEGATIVE NEGATIVE Final    Comment: (NOTE) The Xpert Xpress SARS-CoV-2/FLU/RSV assay is intended as an aid in  the diagnosis of influenza from Nasopharyngeal swab specimens and  should not be used as a sole basis for treatment. Nasal washings and  aspirates are unacceptable for Xpert Xpress SARS-CoV-2/FLU/RSV  testing. Fact Sheet for  Patients: PinkCheek.be Fact Sheet for Healthcare Providers: GravelBags.it This test is not yet approved or cleared by the [place] FDA and  has been authorized for detection and/or diagnosis of SARS-CoV-2 by  FDA under an Emergency Use Authorization (EUA). This EUA will remain  in effect (meaning this test can be used) for the duration of the  Covid-19 declaration under Section 564(b)(1) of the Act, 21  U.S.C. section 360bbb-3(b)(1), unless the authorization is  terminated or revoked. Performed at [facility], [address], [place], [place] [address]   Blood Culture (routine x 2)     Status: None (Preliminary result)   Collection Time: 09/27/19 11:29 PM   Specimen: BLOOD LEFT ARM  Result Value Ref Range Status   Specimen Description BLOOD LEFT ARM  Final   Special Requests   Final    BOTTLES DRAWN AEROBIC ONLY Blood Culture adequate volume   Culture   Final    NO GROWTH 3 DAYS Performed at [facility], [address] [place], [place] [address]    Report Status PENDING  Incomplete  Blood Culture (routine x 2)     Status: None (Preliminary result)   Collection Time: 09/27/19 11:30 PM   Specimen: BLOOD RIGHT FOREARM  Result Value Ref Range Status   Specimen Description BLOOD RIGHT FOREARM  Final   Special Requests   Final    BOTTLES DRAWN AEROBIC AND ANAEROBIC Blood Culture adequate volume   Culture   Final    NO GROWTH 3 DAYS Performed at [facility], [address] [place],  [address]    Report Status PENDING  Incomplete     Radiology Studies: No results found.  Scheduled Meds: . vitamin C  500 mg Oral Daily  . [START ON 10/01/2019] dexamethasone  6 mg Oral Daily  . heparin  5,000 Units Subcutaneous Q8H  . Ipratropium-Albuterol  1 puff Inhalation Q6H WA  . levothyroxine  50 mcg Oral Daily  . mometasone-formoterol  2 puff Inhalation BID  . pantoprazole  40 mg Oral Daily  . zinc sulfate  220 mg Oral  Daily   Continuous Infusions: . remdesivir 100 mg in NS 100 mL 100 mg (09/30/19 1042)     LOS: 2 days    Time spent: 35 minutes.   Barton Dubois, MD [facility]   To contact the attending provider between 7A-7P or the covering provider during after hours 7P-7A, please log into the web site [email or web address] and access using universal [place] password for that web site. If you do not have the password, please call the hospital operator.  09/30/2019, 3:39 PM

## 2019-09-30 NOTE — Plan of Care (Signed)
  Problem: Activity: Goal: Ability to tolerate increased activity will improve Outcome: Progressing   Problem: Clinical Measurements: Goal: Ability to maintain a body temperature in the normal range will improve Outcome: Progressing   Problem: Respiratory: Goal: Ability to maintain adequate ventilation will improve Outcome: Progressing Goal: Ability to maintain a clear airway will improve Outcome: Progressing   Problem: Education: Goal: Knowledge of risk factors and measures for prevention of condition will improve Outcome: Progressing   Problem: Coping: Goal: Psychosocial and spiritual needs will be supported Outcome: Progressing   Problem: Respiratory: Goal: Will maintain a patent airway Outcome: Progressing Goal: Complications related to the disease process, condition or treatment will be avoided or minimized Outcome: Progressing   Problem: Education: Goal: Knowledge of General Education information will improve Description: Including pain rating scale, medication(s)/side effects and non-pharmacologic comfort measures Outcome: Progressing   Problem: Health Behavior/Discharge Planning: Goal: Ability to manage health-related needs will improve Outcome: Progressing   Problem: Clinical Measurements: Goal: Ability to maintain clinical measurements within normal limits will improve Outcome: Progressing Goal: Will remain free from infection Outcome: Progressing Goal: Diagnostic test results will improve Outcome: Progressing Goal: Respiratory complications will improve Outcome: Progressing Goal: Cardiovascular complication will be avoided Outcome: Progressing   Problem: Activity: Goal: Risk for activity intolerance will decrease Outcome: Progressing   Problem: Nutrition: Goal: Adequate nutrition will be maintained Outcome: Progressing   Problem: Coping: Goal: Level of anxiety will decrease Outcome: Progressing   Problem: Elimination: Goal: Will not experience  complications related to bowel motility Outcome: Progressing Goal: Will not experience complications related to urinary retention Outcome: Progressing   Problem: Pain Managment: Goal: General experience of comfort will improve Outcome: Progressing   Problem: Safety: Goal: Ability to remain free from injury will improve Outcome: Progressing   Problem: Skin Integrity: Goal: Risk for impaired skin integrity will decrease Outcome: Progressing   

## 2019-10-01 DIAGNOSIS — J1282 Pneumonia due to Coronavirus disease 2019: Secondary | ICD-10-CM | POA: Diagnosis not present

## 2019-10-01 DIAGNOSIS — U071 COVID-19: Secondary | ICD-10-CM | POA: Diagnosis not present

## 2019-10-01 LAB — COMPREHENSIVE METABOLIC PANEL
ALT: 13 U/L (ref 0–44)
AST: 21 U/L (ref 15–41)
Albumin: 2.4 g/dL — ABNORMAL LOW (ref 3.5–5.0)
Alkaline Phosphatase: 67 U/L (ref 38–126)
Anion gap: 5 (ref 5–15)
BUN: 29 mg/dL — ABNORMAL HIGH (ref 8–23)
CO2: 30 mmol/L (ref 22–32)
Calcium: 8.5 mg/dL — ABNORMAL LOW (ref 8.9–10.3)
Chloride: 108 mmol/L (ref 98–111)
Creatinine, Ser: 0.79 mg/dL (ref 0.44–1.00)
GFR calc Af Amer: >60 mL/min (ref >60)
GFR calc non Af Amer: >60 mL/min (ref >60)
Glucose, Bld: 155 mg/dL — ABNORMAL HIGH (ref 70–99)
Potassium: 4.3 mmol/L (ref 3.5–5.1)
Sodium: 143 mmol/L (ref 135–145)
Total Bilirubin: 0.3 mg/dL (ref 0.3–1.2)
Total Protein: 6.4 g/dL — ABNORMAL LOW (ref 6.5–8.1)

## 2019-10-01 LAB — CBC WITH DIFFERENTIAL/PLATELET
Abs Immature Granulocytes: 1.15 10*3/uL — ABNORMAL HIGH (ref 0.00–0.07)
Basophils Absolute: 0.1 10*3/uL (ref 0.0–0.1)
Basophils Relative: 1 %
Eosinophils Absolute: 0 10*3/uL (ref 0.0–0.5)
Eosinophils Relative: 0 %
HCT: 32.5 % — ABNORMAL LOW (ref 36.0–46.0)
Hemoglobin: 9.5 g/dL — ABNORMAL LOW (ref 12.0–15.0)
Immature Granulocytes: 6 %
Lymphocytes Relative: 6 %
Lymphs Abs: 1.1 10*3/uL (ref 0.7–4.0)
MCH: 26.1 pg (ref 26.0–34.0)
MCHC: 29.2 g/dL — ABNORMAL LOW (ref 30.0–36.0)
MCV: 89.3 fL (ref 80.0–100.0)
Monocytes Absolute: 0.8 10*3/uL (ref 0.1–1.0)
Monocytes Relative: 4 %
Neutro Abs: 15.1 10*3/uL — ABNORMAL HIGH (ref 1.7–7.7)
Neutrophils Relative %: 83 %
Platelets: 357 10*3/uL (ref 150–400)
RBC: 3.64 MIL/uL — ABNORMAL LOW (ref 3.87–5.11)
RDW: 14.4 % (ref 11.5–15.5)
WBC: 18.2 10*3/uL — ABNORMAL HIGH (ref 4.0–10.5)
nRBC: 0.1 % (ref 0.0–0.2)

## 2019-10-01 LAB — FERRITIN: Ferritin: 217 ng/mL (ref 11–307)

## 2019-10-01 LAB — C-REACTIVE PROTEIN: CRP: 13.4 mg/dL — ABNORMAL HIGH (ref <1.0)

## 2019-10-01 LAB — D-DIMER, QUANTITATIVE: D-Dimer, Quant: 2.25 ug/mL-FEU — ABNORMAL HIGH (ref 0.00–0.50)

## 2019-10-01 MED ORDER — ZINC SULFATE 220 (50 ZN) MG PO CAPS: 220.0000 mg | ORAL_CAPSULE | Freq: Every day | ORAL | 0 refills | Status: DC

## 2019-10-01 MED ORDER — DEXAMETHASONE 6 MG PO TABS: 6.0000 mg | ORAL_TABLET | Freq: Every day | ORAL | 0 refills | Status: AC

## 2019-10-01 MED ORDER — ASCORBIC ACID 500 MG PO TABS: 500.0000 mg | ORAL_TABLET | Freq: Every day | ORAL | 0 refills | Status: AC

## 2019-10-01 MED ORDER — DEXTROMETHORPHAN POLISTIREX ER 30 MG/5ML PO SUER: 15.0000 mg | Freq: Four times a day (QID) | ORAL | 0 refills | Status: DC | PRN

## 2019-10-01 NOTE — Clinical Social Work Note (Signed)
Patient does not have transportation to infusion clinic. Transportation set up with [place] with [employer] via [place]. Transportation intake form sent to Transportation via email. Marta Antu confirmed receipt to intake form.  Patient advised that transportation will pick her up for her final infusion on 10/02/19 at approximately 9:00 a.m. for her 10:00 appointment.   TOC signing off.   Giulietta Prokop, Clydene Pugh, LCSW

## 2019-10-01 NOTE — Plan of Care (Signed)
  Problem: Activity: Goal: Ability to tolerate increased activity will improve Outcome: Adequate for Discharge   Problem: Clinical Measurements: Goal: Ability to maintain a body temperature in the normal range will improve Outcome: Adequate for Discharge   Problem: Respiratory: Goal: Ability to maintain adequate ventilation will improve Outcome: Adequate for Discharge Goal: Ability to maintain a clear airway will improve Outcome: Adequate for Discharge   Problem: Education: Goal: Knowledge of risk factors and measures for prevention of condition will improve Outcome: Adequate for Discharge   Problem: Coping: Goal: Psychosocial and spiritual needs will be supported Outcome: Adequate for Discharge   Problem: Respiratory: Goal: Will maintain a patent airway Outcome: Adequate for Discharge Goal: Complications related to the disease process, condition or treatment will be avoided or minimized Outcome: Adequate for Discharge   Problem: Education: Goal: Knowledge of General Education information will improve Description: Including pain rating scale, medication(s)/side effects and non-pharmacologic comfort measures Outcome: Adequate for Discharge   Problem: Health Behavior/Discharge Planning: Goal: Ability to manage health-related needs will improve Outcome: Adequate for Discharge   Problem: Clinical Measurements: Goal: Ability to maintain clinical measurements within normal limits will improve Outcome: Adequate for Discharge Goal: Will remain free from infection Outcome: Adequate for Discharge Goal: Diagnostic test results will improve Outcome: Adequate for Discharge Goal: Respiratory complications will improve Outcome: Adequate for Discharge Goal: Cardiovascular complication will be avoided Outcome: Adequate for Discharge   Problem: Activity: Goal: Risk for activity intolerance will decrease Outcome: Adequate for Discharge   Problem: Nutrition: Goal: Adequate nutrition  will be maintained Outcome: Adequate for Discharge   Problem: Coping: Goal: Level of anxiety will decrease Outcome: Adequate for Discharge   Problem: Elimination: Goal: Will not experience complications related to bowel motility Outcome: Adequate for Discharge Goal: Will not experience complications related to urinary retention Outcome: Adequate for Discharge   Problem: Pain Managment: Goal: General experience of comfort will improve Outcome: Adequate for Discharge   Problem: Safety: Goal: Ability to remain free from injury will improve Outcome: Adequate for Discharge   Problem: Skin Integrity: Goal: Risk for impaired skin integrity will decrease Outcome: Adequate for Discharge

## 2019-10-01 NOTE — Discharge Instructions (Addendum)
You are scheduled for an outpatient infusion of Remdesivir at 10:00 AM on Thursday 5/6.   Please [facility] to [facility] [address].  Drive to the security guard and tell them you are here for an infusion. They will direct you to the front entrance where we will come and get you.  For questions call [phone].  Thanks

## 2019-10-01 NOTE — Discharge Summary (Signed)
Physician Discharge Summary  Marilyn Solomon WEX:[id number] DOB: 07-09-1945 DOA: 09/27/2019  PCP: Lucianne Lei, MD  Admit date: 09/27/2019  Discharge date: 10/01/2019  Admitted From:Home  Disposition:  Home  Recommendations for Outpatient Follow-up:  1. Follow up with PCP in 1-2 weeks 2. Continue on dexamethasone as prescribed for 3 more days and receive outpatient remdesivir infusion on 5/6 to complete course of treatment. 3. Continue home medications as otherwise prescribed  Home Health: None  Equipment/Devices: Has home 2 L nasal cannula oxygen  Discharge Condition: Stable  CODE STATUS: Full  Diet recommendation: Heart Healthy  Brief/Interim Summary: As per H&P written by Dr. Darrick Meigs on 09/28/2019 74 y.o.female,with history of COPD, chronic back pain, GERD, anxiety, hypothyroidism who came to hospital with complaints of body aches and cough. Patient states that she has been sick since Wednesday. She also complains of chest pain with coughing and coughing up yellow phlegm. She denies shortness of breath. She is on home oxygen at 2 L/min. In the ED her oxygen saturations found to be 79% on room air. She had nausea and vomiting few days ago but is not vomiting anymore. She denies abdominal pain or dysuria No previous history of stroke or seizures.  Patient's lab work revealed positive SARS-CoV-2 RT-PCR.Patient's chest x-rayshows increased interstitial markings.   5/6: Patient was admitted for acute on chronic respiratory failure with COVID-19 pneumonia.  She has completed 4 days of steroid and remdesivir treatment and is back to her usual baseline of 2 L nasal cannula oxygen.  She is doing much better and will complete 1 further dose of outpatient remdesivir infusion tomorrow and will remain on steroids as prescribed.  She was also noted to have some AKI on admission with thought that she may have stage IIIb CKD, but she is doing much better with creatinine 0.79 on day of  discharge and appears to perhaps have stage II CKD.  She is in stable condition for discharge today with no acute overnight events noted.  Discharge Diagnoses:  Active Problems:   Pneumonia due to COVID-19 virus  Principal discharge diagnosis: Acute on chronic hypoxemic respiratory failure secondary to COVID-19 pneumonia with AKI.  Discharge Instructions  Discharge Instructions    Diet - low sodium heart healthy   Complete by: As directed    Increase activity slowly   Complete by: As directed      Allergies as of 10/01/2019      Reactions   Penicillins Itching   Has patient had a PCN reaction causing immediate rash, facial/tongue/throat swelling, SOB or lightheadedness with hypotension: no Has patient had a PCN reaction causing severe rash involving mucus membranes or skin necrosis: No Has patient had a PCN reaction that required hospitalization No Has patient had a PCN reaction occurring within the last 10 years: No If all of the above answers are "NO", then may proceed with Cephalosporin use.   Vicodin [hydrocodone-acetaminophen] Nausea And Vomiting      Medication List    TAKE these medications   ALPRAZolam 1 MG tablet Commonly known as: XANAX Take 1 mg by mouth every 8 (eight) hours as needed for anxiety.   ascorbic acid 500 MG tablet Commonly known as: VITAMIN C Take 1 tablet (500 mg total) by mouth daily for 10 days. Start taking on: Oct 02, 2019   dexamethasone 6 MG tablet Commonly known as: DECADRON Take 1 tablet (6 mg total) by mouth daily for 3 days. Start taking on: Oct 02, 2019   dextromethorphan 30  MG/5ML liquid Commonly known as: DELSYM Take 2.5 mLs (15 mg total) by mouth every 6 (six) hours as needed for cough.   Fluticasone-Salmeterol 250-50 MCG/DOSE Aepb Commonly known as: Advair Diskus Inhale 1 puff into the lungs 2 (two) times daily.   ipratropium-albuterol 0.5-2.5 (3) MG/3ML Soln Commonly known as: DUONEB Take 3 mLs by nebulization every 6 (six)  hours as needed (shortness of breath or wheezing).   levothyroxine 50 MCG tablet Commonly known as: SYNTHROID Take 1 tablet by mouth daily.   oxyCODONE 15 MG immediate release tablet Commonly known as: ROXICODONE Take 15 mg by mouth 3 (three) times daily as needed for pain.   pantoprazole 40 MG tablet Commonly known as: PROTONIX TAKE 1 TABLET BY MOUTH DAILY. What changed: how much to take   potassium chloride 10 MEQ tablet Commonly known as: KLOR-CON Take 1 tablet by mouth daily.   Trelegy Ellipta 100-62.5-25 MCG/INH Aepb Generic drug: Fluticasone-Umeclidin-Vilant Inhale 1 puff into the lungs daily.   zinc sulfate 220 (50 Zn) MG capsule Take 1 capsule (220 mg total) by mouth daily for 10 days. Start taking on: Oct 02, 2019      Follow-up Information    Lucianne Lei, MD Follow up in 1 week(s).   Specialty: Family Medicine Contact information: [address] [place] [address] [phone]          Allergies  Allergen Reactions  . Penicillins Itching    Has patient had a PCN reaction causing immediate rash, facial/tongue/throat swelling, SOB or lightheadedness with hypotension: no Has patient had a PCN reaction causing severe rash involving mucus membranes or skin necrosis: No Has patient had a PCN reaction that required hospitalization No Has patient had a PCN reaction occurring within the last 10 years: No If all of the above answers are "NO", then may proceed with Cephalosporin use.   . Vicodin [Hydrocodone-Acetaminophen] Nausea And Vomiting    Consultations:  None   Procedures/Studies: DG Chest 2 View  Result Date: 09/27/2019 CLINICAL DATA:  Cough. EXAM: CHEST - 2 VIEW COMPARISON:  November 06, 2018 FINDINGS: The lungs are hyperinflated. Moderate severity diffusely increased interstitial lung markings are seen. This is similar in appearance when compared to the prior study. Mild, predominant stable right apical scarring and/or atelectasis is seen. Mild  areas of atelectasis and/or infiltrate are seen along the periphery of the bilateral lung bases. This represents a new finding when compared to the prior study. There is no evidence of a pleural effusion or pneumothorax. The heart size and mediastinal contours are within normal limits. There is moderate severity calcification of the aortic arch. Degenerative changes seen throughout the thoracic spine. IMPRESSION: 1. Mild bibasilar atelectasis and/or infiltrate, new when compared to the prior study. 2. Chronic interstitial lung disease with stable right apical scarring and/or atelectasis. Electronically Signed   By: Virgina Norfolk M.D.   On: 09/27/2019 21:11     Discharge Exam: Vitals:   10/01/19 0527 10/01/19 0749  BP: (!) 158/82   Pulse: 93   Resp: 20   Temp: 98 F (36.7 C)   SpO2: 93% 93%   Vitals:   09/30/19 1909 09/30/19 2209 10/01/19 0527 10/01/19 0749  BP:  (!) 146/68 (!) 158/82   Pulse:  84 93   Resp:  20 20   Temp:  97.8 F (36.6 C) 98 F (36.7 C)   TempSrc:      SpO2: 99% 98% 93% 93%  Weight:      Height:  General: Pt is alert, awake, not in acute distress Cardiovascular: RRR, S1/S2 +, no rubs, no gallops Respiratory: CTA bilaterally, no wheezing, no rhonchi, on 2 L nasal cannula oxygen. Abdominal: Soft, NT, ND, bowel sounds + Extremities: no edema, no cyanosis    The results of significant diagnostics from this hospitalization (including imaging, microbiology, ancillary and laboratory) are listed below for reference.     Microbiology: Recent Results (from the past 240 hour(s))  Respiratory Panel by RT PCR (Flu A&B, Covid) - Nasopharyngeal Swab     Status: Abnormal   Collection Time: 09/27/19  9:52 PM   Specimen: Nasopharyngeal Swab  Result Value Ref Range Status   SARS Coronavirus 2 by RT PCR POSITIVE (A) NEGATIVE Final    Comment: RESULT CALLED TO, READ BACK BY AND VERIFIED WITH: K NICKELS AT 2306 ON 09/27/2019 BY MOSLEY,J (NOTE) SARS-CoV-2 target  nucleic acids are DETECTED. SARS-CoV-2 RNA is generally detectable in upper respiratory specimens  during the acute phase of infection. Positive results are indicative of the presence of the identified virus, but do not rule out bacterial infection or co-infection with other pathogens not detected by the test. Clinical correlation with patient history and other diagnostic information is necessary to determine patient infection status. The expected result is Negative. Fact Sheet for Patients:  PinkCheek.be Fact Sheet for Healthcare Providers: GravelBags.it This test is not yet approved or cleared by the [place] FDA and  has been authorized for detection and/or diagnosis of SARS-CoV-2 by FDA under an Emergency Use Authorization (EUA).  This EUA will remain in effect (meaning this test can be u sed) for the duration of  the COVID-19 declaration under Section 564(b)(1) of the Act, 21 U.S.C. section 360bbb-3(b)(1), unless the authorization is terminated or revoked sooner.    Influenza A by PCR NEGATIVE NEGATIVE Final   Influenza B by PCR NEGATIVE NEGATIVE Final    Comment: (NOTE) The Xpert Xpress SARS-CoV-2/FLU/RSV assay is intended as an aid in  the diagnosis of influenza from Nasopharyngeal swab specimens and  should not be used as a sole basis for treatment. Nasal washings and  aspirates are unacceptable for Xpert Xpress SARS-CoV-2/FLU/RSV  testing. Fact Sheet for Patients: PinkCheek.be Fact Sheet for Healthcare Providers: GravelBags.it This test is not yet approved or cleared by the [place] FDA and  has been authorized for detection and/or diagnosis of SARS-CoV-2 by  FDA under an Emergency Use Authorization (EUA). This EUA will remain  in effect (meaning this test can be used) for the duration of the  Covid-19 declaration under Section 564(b)(1) of the Act,  21  U.S.C. section 360bbb-3(b)(1), unless the authorization is  terminated or revoked. Performed at [facility], [address], [place], [place] [address]   Blood Culture (routine x 2)     Status: None (Preliminary result)   Collection Time: 09/27/19 11:29 PM   Specimen: BLOOD LEFT ARM  Result Value Ref Range Status   Specimen Description BLOOD LEFT ARM  Final   Special Requests   Final    BOTTLES DRAWN AEROBIC ONLY Blood Culture adequate volume   Culture   Final    NO GROWTH 4 DAYS Performed at [facility], [address], [place], [place] [address]    Report Status PENDING  Incomplete  Blood Culture (routine x 2)     Status: None (Preliminary result)   Collection Time: 09/27/19 11:30 PM   Specimen: BLOOD RIGHT FOREARM  Result Value Ref Range Status   Specimen Description BLOOD RIGHT  FOREARM  Final   Special Requests   Final    BOTTLES DRAWN AEROBIC AND ANAEROBIC Blood Culture adequate volume   Culture   Final    NO GROWTH 4 DAYS Performed at [facility], [address] [place], [place] [address]    Report Status PENDING  Incomplete     Labs: BNP (last 3 results) No results for input(s): BNP in the last 8760 hours. Basic Metabolic Panel: Recent Labs  Lab 09/27/19 2057 09/29/19 0440 09/30/19 0438 10/01/19 0500  NA 141 143 144 143  K 3.9 4.5 4.3 4.3  CL 103 108 109 108  CO2 26 27 27 30   GLUCOSE 117* 178* 145* 155*  BUN 60* 48* 33* 29*  CREATININE 2.17* 1.34* 0.96 0.79  CALCIUM 8.4* 8.2* 8.6* 8.5*   Liver Function Tests: Recent Labs  Lab 09/27/19 2329 09/29/19 0440 09/30/19 0438 10/01/19 0500  AST 21 19 18 21   ALT 10 10 11 13   ALKPHOS 82 69 63 67  BILITOT 0.2* 0.4 0.3 0.3  PROT 6.7 6.0* 6.0* 6.4*  ALBUMIN 2.7* 2.5* 2.3* 2.4*   No results for input(s): LIPASE, AMYLASE in the last 168 hours. No results for input(s): AMMONIA in the last 168 hours. CBC: Recent Labs  Lab 09/27/19 2057 09/29/19 0440 09/30/19 0438 10/01/19 0500  WBC 7.4 12.4* 17.4*  18.2*  NEUTROABS 5.9 10.4* 15.8* 15.1*  HGB 9.4* 8.5* 8.8* 9.5*  HCT 32.2* 30.6* 30.4* 32.5*  MCV 90.7 93.3 90.5 89.3  PLT 313 300 338 357   Cardiac Enzymes: No results for input(s): CKTOTAL, CKMB, CKMBINDEX, TROPONINI in the last 168 hours. BNP: Invalid input(s): POCBNP CBG: No results for input(s): GLUCAP in the last 168 hours. D-Dimer Recent Labs    09/30/19 0438 10/01/19 0500  DDIMER 2.26* 2.25*   Hgb A1c No results for input(s): HGBA1C in the last 72 hours. Lipid Profile No results for input(s): CHOL, HDL, LDLCALC, TRIG, CHOLHDL, LDLDIRECT in the last 72 hours. Thyroid function studies No results for input(s): TSH, T4TOTAL, T3FREE, THYROIDAB in the last 72 hours.  Invalid input(s): FREET3 Anemia work up Recent Labs    09/30/19 0438 10/01/19 0500  FERRITIN 200 217   Urinalysis    Component Value Date/Time   COLORURINE AMBER (A) 06/21/2018 1450   APPEARANCEUR CLOUDY (A) 06/21/2018 1450   LABSPEC 1.020 06/21/2018 1450   PHURINE 5.0 06/21/2018 1450   GLUCOSEU NEGATIVE 06/21/2018 1450   HGBUR NEGATIVE 06/21/2018 1450   BILIRUBINUR NEGATIVE 06/21/2018 1450   KETONESUR NEGATIVE 06/21/2018 1450   PROTEINUR 30 (A) 06/21/2018 1450   UROBILINOGEN 0.2 03/25/2008 1702   NITRITE NEGATIVE 06/21/2018 1450   LEUKOCYTESUR TRACE (A) 06/21/2018 1450   Sepsis Labs Invalid input(s): PROCALCITONIN,  WBC,  LACTICIDVEN Microbiology Recent Results (from the past 240 hour(s))  Respiratory Panel by RT PCR (Flu A&B, Covid) - Nasopharyngeal Swab     Status: Abnormal   Collection Time: 09/27/19  9:52 PM   Specimen: Nasopharyngeal Swab  Result Value Ref Range Status   SARS Coronavirus 2 by RT PCR POSITIVE (A) NEGATIVE Final    Comment: RESULT CALLED TO, READ BACK BY AND VERIFIED WITH: K NICKELS AT 2306 ON 09/27/2019 BY MOSLEY,J (NOTE) SARS-CoV-2 target nucleic acids are DETECTED. SARS-CoV-2 RNA is generally detectable in upper respiratory specimens  during the acute phase of  infection. Positive results are indicative of the presence of the identified virus, but do not rule out bacterial infection or co-infection with other pathogens not detected by the  test. Clinical correlation with patient history and other diagnostic information is necessary to determine patient infection status. The expected result is Negative. Fact Sheet for Patients:  PinkCheek.be Fact Sheet for Healthcare Providers: GravelBags.it This test is not yet approved or cleared by the [place] FDA and  has been authorized for detection and/or diagnosis of SARS-CoV-2 by FDA under an Emergency Use Authorization (EUA).  This EUA will remain in effect (meaning this test can be u sed) for the duration of  the COVID-19 declaration under Section 564(b)(1) of the Act, 21 U.S.C. section 360bbb-3(b)(1), unless the authorization is terminated or revoked sooner.    Influenza A by PCR NEGATIVE NEGATIVE Final   Influenza B by PCR NEGATIVE NEGATIVE Final    Comment: (NOTE) The Xpert Xpress SARS-CoV-2/FLU/RSV assay is intended as an aid in  the diagnosis of influenza from Nasopharyngeal swab specimens and  should not be used as a sole basis for treatment. Nasal washings and  aspirates are unacceptable for Xpert Xpress SARS-CoV-2/FLU/RSV  testing. Fact Sheet for Patients: PinkCheek.be Fact Sheet for Healthcare Providers: GravelBags.it This test is not yet approved or cleared by the [place] FDA and  has been authorized for detection and/or diagnosis of SARS-CoV-2 by  FDA under an Emergency Use Authorization (EUA). This EUA will remain  in effect (meaning this test can be used) for the duration of the  Covid-19 declaration under Section 564(b)(1) of the Act, 21  U.S.C. section 360bbb-3(b)(1), unless the authorization is  terminated or revoked. Performed at [facility], [address], [place], [place] [address]   Blood Culture (routine x 2)     Status: None (Preliminary result)   Collection Time: 09/27/19 11:29 PM   Specimen: BLOOD LEFT ARM  Result Value Ref Range Status   Specimen Description BLOOD LEFT ARM  Final   Special Requests   Final    BOTTLES DRAWN AEROBIC ONLY Blood Culture adequate volume   Culture   Final    NO GROWTH 4 DAYS Performed at [facility], [address], [place], [place] [address]    Report Status PENDING  Incomplete  Blood Culture (routine x 2)     Status: None (Preliminary result)   Collection Time: 09/27/19 11:30 PM   Specimen: BLOOD RIGHT FOREARM  Result Value Ref Range Status   Specimen Description BLOOD RIGHT FOREARM  Final   Special Requests   Final    BOTTLES DRAWN AEROBIC AND ANAEROBIC Blood Culture adequate volume   Culture   Final    NO GROWTH 4 DAYS Performed at [facility], [address], [place], [place] [address]    Report Status PENDING  Incomplete     Time coordinating discharge: 35 minutes  SIGNED:   Rodena Goldmann, DO [facility] 10/01/2019, 11:26 AM  If 7PM-7AM, please contact night-coverage [email or web address]

## 2019-10-01 NOTE — Progress Notes (Signed)
Patient scheduled for outpatient Remdesivir infusion at 10:00 AM on Thursday 5/6.  Please advise them to report to [facility] at [address].  Drive to the security guard and tell them you are here for an infusion. They will direct you to the front entrance where we will come and get you.  For questions call [phone].  Thanks

## 2019-10-02 ENCOUNTER — Ambulatory Visit (HOSPITAL_COMMUNITY)
Admit: 2019-10-02 | Discharge: 2019-10-02 | Disposition: A | Discharge status: Home / Self Care | Payer: Medicare Other | Source: Ambulatory Visit | Attending: Pulmonary Disease | Admitting: Pulmonary Disease

## 2019-10-02 DIAGNOSIS — U071 COVID-19: Secondary | ICD-10-CM | POA: Diagnosis present

## 2019-10-02 LAB — CULTURE, BLOOD (ROUTINE X 2)
Culture: NO GROWTH
Culture: NO GROWTH
Special Requests: ADEQUATE
Special Requests: ADEQUATE

## 2019-10-02 MED ORDER — DIPHENHYDRAMINE HCL 50 MG/ML IJ SOLN: 50.0000 mg | Freq: Once | INTRAMUSCULAR | Status: DC | PRN

## 2019-10-02 MED ORDER — SODIUM CHLORIDE 0.9 % IV SOLN: INTRAVENOUS | Status: DC | PRN

## 2019-10-02 MED ORDER — SODIUM CHLORIDE 0.9 % IV SOLN
100.0000 mg | Freq: Once | INTRAVENOUS | Status: AC
  Administered 2019-10-02: 100 mg via INTRAVENOUS
  Filled 2019-10-02: qty 20

## 2019-10-02 MED ORDER — METHYLPREDNISOLONE SODIUM SUCC 125 MG IJ SOLR: 125.0000 mg | Freq: Once | INTRAMUSCULAR | Status: DC | PRN

## 2019-10-02 MED ORDER — FAMOTIDINE IN NACL 20-0.9 MG/50ML-% IV SOLN: 20.0000 mg | Freq: Once | INTRAVENOUS | Status: DC | PRN

## 2019-10-02 MED ORDER — EPINEPHRINE 0.3 MG/0.3ML IJ SOAJ: 0.3000 mg | Freq: Once | INTRAMUSCULAR | Status: DC | PRN

## 2019-10-02 MED ORDER — ALBUTEROL SULFATE HFA 108 (90 BASE) MCG/ACT IN AERS: 2.0000 | INHALATION_SPRAY | Freq: Once | RESPIRATORY_TRACT | Status: DC | PRN

## 2019-10-02 NOTE — Progress Notes (Signed)
  Diagnosis: COVID-19  Physician: Dr. Joya Gaskins  Procedure: [facility] Med: remdesivir infusion - Provided patient with remdesivir fact sheet for patients, parents and caregivers prior to infusion.  Complications: No immediate complications noted.  Discharge: Discharged home   Marilyn Solomon 10/02/2019

## 2019-10-07 ENCOUNTER — Encounter (HOSPITAL_COMMUNITY): Payer: Self-pay

## 2019-10-07 ENCOUNTER — Emergency Department (HOSPITAL_COMMUNITY): Payer: Medicare Other

## 2019-10-07 ENCOUNTER — Inpatient Hospital Stay (HOSPITAL_COMMUNITY)
Admission: EM | Admit: 2019-10-07 | Discharge: 2019-10-10 | DRG: 372 | Disposition: A | Discharge status: Home Health | Payer: Medicare Other | Attending: Family Medicine | Admitting: Family Medicine

## 2019-10-07 ENCOUNTER — Other Ambulatory Visit: Payer: Self-pay

## 2019-10-07 DIAGNOSIS — I959 Hypotension, unspecified: Secondary | ICD-10-CM | POA: Diagnosis present

## 2019-10-07 DIAGNOSIS — M549 Dorsalgia, unspecified: Secondary | ICD-10-CM | POA: Diagnosis present

## 2019-10-07 DIAGNOSIS — D649 Anemia, unspecified: Secondary | ICD-10-CM | POA: Diagnosis present

## 2019-10-07 DIAGNOSIS — I10 Essential (primary) hypertension: Secondary | ICD-10-CM | POA: Diagnosis not present

## 2019-10-07 DIAGNOSIS — E039 Hypothyroidism, unspecified: Secondary | ICD-10-CM | POA: Diagnosis present

## 2019-10-07 DIAGNOSIS — M1990 Unspecified osteoarthritis, unspecified site: Secondary | ICD-10-CM | POA: Diagnosis not present

## 2019-10-07 DIAGNOSIS — K5790 Diverticulosis of intestine, part unspecified, without perforation or abscess without bleeding: Secondary | ICD-10-CM | POA: Diagnosis present

## 2019-10-07 DIAGNOSIS — Z880 Allergy status to penicillin: Secondary | ICD-10-CM | POA: Diagnosis not present

## 2019-10-07 DIAGNOSIS — Z885 Allergy status to narcotic agent status: Secondary | ICD-10-CM

## 2019-10-07 DIAGNOSIS — A0472 Enterocolitis due to Clostridium difficile, not specified as recurrent: Principal | ICD-10-CM | POA: Diagnosis present

## 2019-10-07 DIAGNOSIS — J9611 Chronic respiratory failure with hypoxia: Secondary | ICD-10-CM | POA: Diagnosis not present

## 2019-10-07 DIAGNOSIS — Z87891 Personal history of nicotine dependence: Secondary | ICD-10-CM

## 2019-10-07 DIAGNOSIS — K2100 Gastro-esophageal reflux disease with esophagitis, without bleeding: Secondary | ICD-10-CM | POA: Diagnosis present

## 2019-10-07 DIAGNOSIS — A419 Sepsis, unspecified organism: Secondary | ICD-10-CM | POA: Diagnosis not present

## 2019-10-07 DIAGNOSIS — R197 Diarrhea, unspecified: Secondary | ICD-10-CM

## 2019-10-07 DIAGNOSIS — D72829 Elevated white blood cell count, unspecified: Secondary | ICD-10-CM | POA: Diagnosis not present

## 2019-10-07 DIAGNOSIS — Z79890 Hormone replacement therapy (postmenopausal): Secondary | ICD-10-CM

## 2019-10-07 DIAGNOSIS — N179 Acute kidney failure, unspecified: Secondary | ICD-10-CM | POA: Diagnosis present

## 2019-10-07 DIAGNOSIS — Z800 Family history of malignant neoplasm of digestive organs: Secondary | ICD-10-CM

## 2019-10-07 DIAGNOSIS — R195 Other fecal abnormalities: Secondary | ICD-10-CM | POA: Diagnosis not present

## 2019-10-07 DIAGNOSIS — J441 Chronic obstructive pulmonary disease with (acute) exacerbation: Secondary | ICD-10-CM | POA: Diagnosis present

## 2019-10-07 DIAGNOSIS — Z8616 Personal history of COVID-19: Secondary | ICD-10-CM

## 2019-10-07 DIAGNOSIS — R739 Hyperglycemia, unspecified: Secondary | ICD-10-CM | POA: Diagnosis not present

## 2019-10-07 DIAGNOSIS — K529 Noninfective gastroenteritis and colitis, unspecified: Secondary | ICD-10-CM | POA: Diagnosis not present

## 2019-10-07 DIAGNOSIS — Z8249 Family history of ischemic heart disease and other diseases of the circulatory system: Secondary | ICD-10-CM

## 2019-10-07 DIAGNOSIS — R0602 Shortness of breath: Secondary | ICD-10-CM

## 2019-10-07 DIAGNOSIS — R109 Unspecified abdominal pain: Secondary | ICD-10-CM | POA: Diagnosis not present

## 2019-10-07 DIAGNOSIS — G8929 Other chronic pain: Secondary | ICD-10-CM | POA: Diagnosis not present

## 2019-10-07 DIAGNOSIS — R0603 Acute respiratory distress: Secondary | ICD-10-CM | POA: Diagnosis not present

## 2019-10-07 DIAGNOSIS — J449 Chronic obstructive pulmonary disease, unspecified: Secondary | ICD-10-CM | POA: Diagnosis present

## 2019-10-07 DIAGNOSIS — F419 Anxiety disorder, unspecified: Secondary | ICD-10-CM | POA: Diagnosis present

## 2019-10-07 DIAGNOSIS — R000 Tachycardia, unspecified: Secondary | ICD-10-CM | POA: Diagnosis not present

## 2019-10-07 DIAGNOSIS — Z833 Family history of diabetes mellitus: Secondary | ICD-10-CM

## 2019-10-07 DIAGNOSIS — K219 Gastro-esophageal reflux disease without esophagitis: Secondary | ICD-10-CM | POA: Diagnosis not present

## 2019-10-07 DIAGNOSIS — J1282 Pneumonia due to coronavirus disease 2019: Secondary | ICD-10-CM

## 2019-10-07 LAB — HEPATIC FUNCTION PANEL
ALT: 12 U/L (ref 0–44)
AST: 17 U/L (ref 15–41)
Albumin: 2.3 g/dL — ABNORMAL LOW (ref 3.5–5.0)
Alkaline Phosphatase: 90 U/L (ref 38–126)
Bilirubin, Direct: 0.2 mg/dL (ref 0.0–0.2)
Indirect Bilirubin: 0.6 mg/dL (ref 0.3–0.9)
Total Bilirubin: 0.8 mg/dL (ref 0.3–1.2)
Total Protein: 7.8 g/dL (ref 6.5–8.1)

## 2019-10-07 LAB — LACTIC ACID, PLASMA
Lactic Acid, Venous: 1.3 mmol/L (ref 0.5–1.9)
Lactic Acid, Venous: 1.1 mmol/L (ref 0.5–1.9)

## 2019-10-07 LAB — BASIC METABOLIC PANEL
Anion gap: 12 (ref 5–15)
BUN: 36 mg/dL — ABNORMAL HIGH (ref 8–23)
CO2: 31 mmol/L (ref 22–32)
Calcium: 8.3 mg/dL — ABNORMAL LOW (ref 8.9–10.3)
Chloride: 94 mmol/L — ABNORMAL LOW (ref 98–111)
Creatinine, Ser: 1.14 mg/dL — ABNORMAL HIGH (ref 0.44–1.00)
GFR calc Af Amer: 55 mL/min — ABNORMAL LOW (ref >60)
GFR calc non Af Amer: 48 mL/min — ABNORMAL LOW (ref >60)
Glucose, Bld: 145 mg/dL — ABNORMAL HIGH (ref 70–99)
Potassium: 4.5 mmol/L (ref 3.5–5.1)
Sodium: 137 mmol/L (ref 135–145)

## 2019-10-07 LAB — CBC WITH DIFFERENTIAL/PLATELET
Abs Immature Granulocytes: 0.75 10*3/uL — ABNORMAL HIGH (ref 0.00–0.07)
Basophils Absolute: 0.1 10*3/uL (ref 0.0–0.1)
Basophils Relative: 0 %
Eosinophils Absolute: 0 10*3/uL (ref 0.0–0.5)
Eosinophils Relative: 0 %
HCT: 36.3 % (ref 36.0–46.0)
Hemoglobin: 10.6 g/dL — ABNORMAL LOW (ref 12.0–15.0)
Immature Granulocytes: 2 %
Lymphocytes Relative: 2 %
Lymphs Abs: 0.7 10*3/uL (ref 0.7–4.0)
MCH: 26.2 pg (ref 26.0–34.0)
MCHC: 29.2 g/dL — ABNORMAL LOW (ref 30.0–36.0)
MCV: 89.9 fL (ref 80.0–100.0)
Monocytes Absolute: 1.6 10*3/uL — ABNORMAL HIGH (ref 0.1–1.0)
Monocytes Relative: 4 %
Neutro Abs: 35.8 10*3/uL — ABNORMAL HIGH (ref 1.7–7.7)
Neutrophils Relative %: 92 %
Platelets: 494 10*3/uL — ABNORMAL HIGH (ref 150–400)
RBC: 4.04 MIL/uL (ref 3.87–5.11)
RDW: 14.9 % (ref 11.5–15.5)
WBC: 39 10*3/uL — ABNORMAL HIGH (ref 4.0–10.5)
nRBC: 0 % (ref 0.0–0.2)

## 2019-10-07 LAB — PROTIME-INR
INR: 1.1 (ref 0.8–1.2)
Prothrombin Time: 13.9 seconds (ref 11.4–15.2)

## 2019-10-07 LAB — APTT: aPTT: 27 seconds (ref 24–36)

## 2019-10-07 MED ORDER — IOHEXOL 300 MG/ML  SOLN
75.0000 mL | Freq: Once | INTRAMUSCULAR | Status: AC | PRN
  Administered 2019-10-07: 75 mL via INTRAVENOUS

## 2019-10-07 MED ORDER — LACTATED RINGERS IV BOLUS (SEPSIS)
500.0000 mL | Freq: Once | INTRAVENOUS | Status: AC
  Administered 2019-10-07: 500 mL via INTRAVENOUS

## 2019-10-07 MED ORDER — SODIUM CHLORIDE 0.9 % IV SOLN
500.0000 mg | INTRAVENOUS | Status: DC
  Administered 2019-10-07: 500 mg via INTRAVENOUS
  Filled 2019-10-07: qty 500

## 2019-10-07 MED ORDER — LACTATED RINGERS IV BOLUS (SEPSIS)
250.0000 mL | Freq: Once | INTRAVENOUS | Status: AC
  Administered 2019-10-07: 250 mL via INTRAVENOUS

## 2019-10-07 MED ORDER — LACTATED RINGERS IV BOLUS (SEPSIS)
1000.0000 mL | Freq: Once | INTRAVENOUS | Status: AC
  Administered 2019-10-07: 2000 mL via INTRAVENOUS

## 2019-10-07 MED ORDER — SODIUM CHLORIDE 0.9 % IV SOLN
2.0000 g | INTRAVENOUS | Status: DC
  Administered 2019-10-07: 2 g via INTRAVENOUS
  Filled 2019-10-07: qty 20

## 2019-10-07 NOTE — ED Notes (Signed)
Confirmed with lab that both sets of blood cultures were done prior to start of antibiotic

## 2019-10-07 NOTE — ED Provider Notes (Signed)
[facility] EMERGENCY DEPARTMENT Provider Note   CSN: [id number] Arrival date & time: 10/07/19  1751     History Chief Complaint  Patient presents with  . Emesis    Marilyn Solomon is a 74 y.o. female.  HPI  Patient presents to the emergency department with chief complaint of diarrhea that has been going on for 2 weeks and admits that nothing makes it better or worse.   Patient has significant medical history of anemia, anxiety, arthritis, COPD, GERD, hypertension, hypothyroidism, pneumonia due to COVID-19. She explains that the diarrhea started when she was diagnosed with Covid pneumonia on 05/01 and hospitalized for 4 days. Since discharge she has 3 bowel movements a day. She denies seeing any blood in the stool but admits the stool is very dark and smells bad.  Patient has been taking Imodium without any success.  She also complains of coughing up phlegm since being discharged from the hospital.  She denies seeing any blood tinge sputum, difficulty breathing, shortness of breath, but does admit that she has felt more fatigued lately.  Patient denies having any headaches, fever chills change in vision sore throat, shortness of breath, chest pain, abdominal pain, difficulty urinating or any pedal edema.     Past Medical History:  Diagnosis Date  . Anemia   . Anxiety   . Arthritis   . Chronic back pain   . COPD (chronic obstructive pulmonary disease) ([place])   . GERD (gastroesophageal reflux disease)   . Hypertension   . Hypothyroidism   . Reflux    no medications currently, asymptomatic  . Thyroid disease     Patient Active Problem List   Diagnosis Date Noted  . Sepsis due to undetermined organism ([place]) 10/07/2019  . Pneumonia due to COVID-19 virus 09/28/2019  . Hypokalemia 11/07/2018  . Hyperglycemia 11/07/2018  . Acute on chronic respiratory failure with hypoxemia ([place]) 06/21/2018  . COPD exacerbation ([place]) 06/02/2018  . Anemia 06/02/2018  . Hypertension 06/02/2018    . COPD with acute exacerbation ([place]) 01/31/2018  . Tachycardia 01/31/2018  . Hypoxia 01/31/2018  . Leukocytosis 01/31/2018  . Positive colorectal cancer screening using Cologuard test 08/21/2017  . Normocytic anemia   . Anxiety 06/30/2017  . Hypothyroidism 06/30/2017  . GERD (gastroesophageal reflux disease) 06/30/2017  . COPD (chronic obstructive pulmonary disease) ([place]) 06/30/2017  . Elevated liver enzymes 06/30/2017  . Severe Iron deficiency anemia 06/30/2017  . HCAP (healthcare-associated pneumonia) 06/28/2017  . CAP (community acquired pneumonia) 06/28/2017  . Second degree hemorrhoids   . Encounter for screening colonoscopy 10/28/2015    Past Surgical History:  Procedure Laterality Date  . ABDOMINAL HYSTERECTOMY    . BIOPSY  10/08/2017   Procedure: BIOPSY;  Surgeon: Daneil Dolin, MD;  Location: [facility];  Service: Endoscopy;;  gastric  . cataracts Bilateral   . COLONOSCOPY  2007   Dr. Gala Romney: normal rectum, diverticula   . COLONOSCOPY WITH PROPOFOL N/A 11/15/2015   Dr. Gala Romney: grade II hemorrhoids, diverticulosis  . COLONOSCOPY WITH PROPOFOL N/A 10/08/2017   Procedure: COLONOSCOPY WITH PROPOFOL;  Surgeon: Daneil Dolin, MD;  Location: [facility];  Service: Endoscopy;  Laterality: N/A;  11:15am  . ESOPHAGOGASTRODUODENOSCOPY (EGD) WITH PROPOFOL N/A 10/08/2017   Procedure: ESOPHAGOGASTRODUODENOSCOPY (EGD) WITH PROPOFOL;  Surgeon: Daneil Dolin, MD;  Location: [facility];  Service: Endoscopy;  Laterality: N/A;  . FRACTURE SURGERY Right 2005   Wrist  . JOINT REPLACEMENT Left 2000   hip  . TOTAL HIP  ARTHROPLASTY Left   . WRIST SURGERY Right      OB History   No obstetric history on file.     Family History  Problem Relation Age of Onset  . Bone cancer Sister   . Pancreatic cancer Sister   . Pneumonia Mother   . Heart attack Father   . Diabetes Sister   . Dementia Sister   . Colon cancer Neg Hx     Social History   Tobacco Use  . Smoking status:  Former Smoker    Packs/day: 25.00    Years: 1.00    Pack years: 25.00    Types: Cigarettes    Quit date: 08/27/1998    Years since quitting: 21.1  . Smokeless tobacco: Never Used  . Tobacco comment: quit in 2000  Substance Use Topics  . Alcohol use: No    Alcohol/week: 0.0 standard drinks  . Drug use: No    Home Medications Prior to Admission medications   Medication Sig Start Date End Date Taking? Authorizing Provider  albuterol (VENTOLIN HFA) 108 (90 Base) MCG/ACT inhaler Inhale 1-2 puffs into the lungs every 6 (six) hours as needed for wheezing or shortness of breath.   Yes [provider]  ALPRAZolam Duanne Moron) 1 MG tablet Take 1 mg by mouth every 8 (eight) hours as needed for anxiety.  08/10/14  Yes [provider]  ascorbic acid (VITAMIN C) 500 MG tablet Take 1 tablet (500 mg total) by mouth daily for 10 days. 10/02/19 10/12/19 Yes Shah, Pratik D, DO  dextromethorphan (DELSYM) 30 MG/5ML liquid Take 2.5 mLs (15 mg total) by mouth every 6 (six) hours as needed for cough. 10/01/19  Yes Shah, Pratik D, DO  Fluticasone-Umeclidin-Vilant (TRELEGY ELLIPTA) 100-62.5-25 MCG/INH AEPB Inhale 1 puff into the lungs daily.  12/25/18  Yes Sinda Du, MD  levothyroxine (SYNTHROID) 50 MCG tablet Take 50 mcg by mouth daily.  09/11/18  Yes [provider]  oxyCODONE (ROXICODONE) 15 MG immediate release tablet Take 15 mg by mouth 3 (three) times daily as needed for pain.    Yes [provider]  pantoprazole (PROTONIX) 40 MG tablet TAKE 1 TABLET BY MOUTH DAILY. Patient taking differently: Take 40 mg by mouth daily as needed (for acid reflux).  05/27/18  Yes Annitta Needs, NP  Vitamin D, Ergocalciferol, (DRISDOL) 1.25 MG (50000 UNIT) CAPS capsule Take 50,000 Units by mouth every 7 (seven) days.   Yes [provider]  dexamethasone (DECADRON) 4 MG tablet Take 6 mg by mouth daily. 3 day course starting on 10/01/2019 10/01/19   [provider]    ipratropium-albuterol (DUONEB) 0.5-2.5 (3) MG/3ML SOLN Take 3 mLs by nebulization every 6 (six) hours as needed (shortness of breath or wheezing). 06/04/18 11/07/18  Manuella Ghazi, Pratik D, DO  zinc sulfate 220 (50 Zn) MG capsule Take 1 capsule (220 mg total) by mouth daily for 10 days. Patient not taking: Reported on 10/07/2019 10/02/19 10/12/19  Heath Lark D, DO    Allergies    Penicillins and Vicodin [hydrocodone-acetaminophen]  Review of Systems   Review of Systems  Constitutional: Negative for chills and fever.  HENT: Positive for congestion. Negative for ear pain, facial swelling and sore throat.   Respiratory: Positive for cough. Negative for shortness of breath.   Cardiovascular: Negative for chest pain and leg swelling.  Gastrointestinal: Positive for diarrhea. Negative for abdominal pain and blood in stool.  Genitourinary: Negative for enuresis.  Musculoskeletal: Negative for back pain.  Skin: Negative for  rash.  Neurological: Negative for dizziness.  Hematological: Does not bruise/bleed easily.    Physical Exam Updated Vital Signs BP (!) 90/53   Pulse 94   Temp (!) 100.7 F (38.2 C) (Rectal)   Resp (!) 25   Ht 5\' 5"  (1.651 m)   Wt 51.7 kg   SpO2 98%   BMI 18.97 kg/m   Physical Exam Vitals and nursing note reviewed.  Constitutional:      General: She is not in acute distress.    Appearance: She is not ill-appearing.  HENT:     Head: Normocephalic and atraumatic.     Nose: No congestion.     Mouth/Throat:     Mouth: Mucous membranes are moist.     Pharynx: Oropharynx is clear.  Eyes:     General: No scleral icterus. Cardiovascular:     Rate and Rhythm: Regular rhythm. Tachycardia present.     Pulses: Normal pulses.     Heart sounds: No murmur. No friction rub. No gallop.   Pulmonary:     Effort: No respiratory distress.     Breath sounds: Wheezing and rhonchi present. No rales.     Comments: Patient is on 2 L of oxygen which she is normally on at home Abdominal:      General: There is no distension.     Tenderness: There is abdominal tenderness. There is no guarding.     Comments: Abdominal tenderness and bruising located on the lower right abdomen where she received shots when she was hospitalized.    Musculoskeletal:        General: No swelling.  Skin:    General: Skin is warm and dry.     Findings: No rash.  Neurological:     Mental Status: She is alert.  Psychiatric:        Mood and Affect: Mood normal.     ED Results / Procedures / Treatments   Labs (all labs ordered are listed, but only abnormal results are displayed) Labs Reviewed  BASIC METABOLIC PANEL - Abnormal; Notable for the following components:      Result Value   Chloride 94 (*)    Glucose, Bld 145 (*)    BUN 36 (*)    Creatinine, Ser 1.14 (*)    Calcium 8.3 (*)    GFR calc non Af Amer 48 (*)    GFR calc Af Amer 55 (*)    All other components within normal limits  CBC WITH DIFFERENTIAL/PLATELET - Abnormal; Notable for the following components:   WBC 39.0 (*)    Hemoglobin 10.6 (*)    MCHC 29.2 (*)    Platelets 494 (*)    Neutro Abs 35.8 (*)    Monocytes Absolute 1.6 (*)    Abs Immature Granulocytes 0.75 (*)    All other components within normal limits  HEPATIC FUNCTION PANEL - Abnormal; Notable for the following components:   Albumin 2.3 (*)    All other components within normal limits  CULTURE, BLOOD (ROUTINE X 2)  CULTURE, BLOOD (ROUTINE X 2)  C DIFFICILE QUICK SCREEN W PCR REFLEX  URINE CULTURE  LACTIC ACID, PLASMA  LACTIC ACID, PLASMA  APTT  PROTIME-INR  OCCULT BLOOD X 1 CARD TO LAB, STOOL  URINALYSIS, ROUTINE W REFLEX MICROSCOPIC    EKG EKG Interpretation  Date/Time:  Tuesday Oct 07 2019 19:53:32 EDT Ventricular Rate:  111 PR Interval:    QRS Duration: 85 QT Interval:  321 QTC Calculation: 437 R Axis:  70 Text Interpretation: Sinus tachycardia Right atrial enlargement Minimal ST depression, inferior leads Baseline wander in lead(s) V2  since last tracing no significant change Confirmed by Noemi Chapel [phone]) on 10/07/2019 9:33:10 PM   Radiology CT ABDOMEN PELVIS W CONTRAST  Result Date: 10/07/2019 CLINICAL DATA:  Abdominal pain and elevated white blood cell count, history of COVID-19 positivity EXAM: CT ABDOMEN AND PELVIS WITH CONTRAST TECHNIQUE: Multidetector CT imaging of the abdomen and pelvis was performed using the standard protocol following bolus administration of intravenous contrast. CONTRAST:  61mL OMNIPAQUE IOHEXOL 300 MG/ML  SOLN COMPARISON:  Chest x-ray from earlier in the same day. FINDINGS: Lower chest: Lung bases show patchy opacities with a somewhat nodular component consistent with the patient's given clinical history of COVID-19 positivity. No sizable effusion is seen. Hepatobiliary: No focal liver abnormality is seen. No gallstones, gallbladder wall thickening, or biliary dilatation. Pancreas: Unremarkable. No pancreatic ductal dilatation or surrounding inflammatory changes. Spleen: Normal in size without focal abnormality. Adrenals/Urinary Tract: Adrenal glands are within normal limits. Kidneys demonstrate a normal enhancement pattern. No renal calculi or obstructive changes are noted. Small 1 cm cyst is noted within the left kidney. No ureteral stones are noted. The bladder is partially distended. Stomach/Bowel: Scattered diverticular change of the colon is noted without definitive diverticulitis. The appendix is not well visualized although no inflammatory changes are seen to suggest appendicitis. Small bowel shows no obstructive change. No gastric abnormality is seen. Vascular/Lymphatic: Aortic atherosclerosis. No enlarged abdominal or pelvic lymph nodes. Reproductive: Status post hysterectomy. No adnexal masses. Other: No abdominal wall hernia or abnormality. No abdominopelvic ascites. Musculoskeletal: Left hip replacement is noted. No acute bony abnormality is seen. IMPRESSION: Diverticulosis without evidence of  diverticulitis. Changes in the lung bases consistent with the given clinical history of COVID-19 positivity. No other focal abnormality is seen. Electronically Signed   By: Inez Catalina M.D.   On: 10/07/2019 21:51   DG Chest Port 1 View  Result Date: 10/07/2019 CLINICAL DATA:  COVID, shortness of breath EXAM: PORTABLE CHEST 1 VIEW COMPARISON:  09/27/2019 FINDINGS: Lungs remain hyperinflated. Patchy bilateral airspace opacities and interstitial prominence throughout the lungs. Overall, patchy opacities slightly increased since prior study. Heart is normal size. No effusions or pneumothorax. No acute bony abnormality. IMPRESSION: COPD. Patchy bilateral airspace disease slightly worsening since prior study Electronically Signed   By: Rolm Baptise M.D.   On: 10/07/2019 19:23    Procedures Procedures (including critical care time)  Medications Ordered in ED Medications  cefTRIAXone (ROCEPHIN) 2 g in sodium chloride 0.9 % 100 mL IVPB (0 g Intravenous Stopped 10/07/19 2049)  azithromycin (ZITHROMAX) 500 mg in sodium chloride 0.9 % 250 mL IVPB (0 mg Intravenous Stopped 10/07/19 2233)  lactated ringers bolus 1,000 mL (2,000 mLs Intravenous New Bag/Given 10/07/19 1956)    And  lactated ringers bolus 500 mL (500 mLs Intravenous Bolus from Bag 10/07/19 2029)    And  lactated ringers bolus 250 mL (250 mLs Intravenous Bolus from Bag 10/07/19 2029)  iohexol (OMNIPAQUE) 300 MG/ML solution 75 mL (75 mLs Intravenous Contrast Given 10/07/19 2114)    ED Course  I have reviewed the triage vital signs and the nursing notes.  Pertinent labs & imaging results that were available during my care of the patient were reviewed by me and considered in my medical decision making (see chart for details).      Clinical Course as of Oct 07 2318  Tue Oct 07, 2019  4481 Basic metabolic panel(!) [WF]  Clinical Course User Index [WF] Aron Baba   MDM Rules/Calculators/A&P                     Patient CBC  shows she has leukocytosis with white blood count of 39 and she has elevated platelet count of 494.  Patient's metabolic panel shows a bun of 36 creatinine of 1.14 and a GFR of 55.  Patient's Hemoccult was negative. I personally reviewed the x-ray and it shows  bilateral patchy air disease which looks slightly worse than previous chest x-ray.  A code sepsis was called on the patient because she is tachycardic, rectal temperature of 100.7,  labs show  leukocytosis with elevated neutrophils,  chest x-ray showing worsening patchy air disease.  Following sepsis protocol patient was placed on azithromycin and ceftriaxone for bacterial pneumonia.  She was also given a lactated Ringer for AKI.  CT abdomen was ordered to evaluate her abdominal pain in the presence of an elevated white count of 38 to help rule out source of infection.   Patient appears septic and hypotensive despite fluid replacement but maintained a open and clear airway. Patient's systolic pressure remaining at 90 and diastolic of 53.  Patient's heart rate remains in the mid 90s, tachypneic but is satting in the upper 90s while on 2 L of oxygen.  Due to the patient's lab work and vital signs it is recommended that the patient be admitted to the hospital for further evaluation and treatment for sepsis  Final Clinical Impression(s) / ED Diagnoses Final diagnoses:  Sepsis associated hypotension ([place])  Pneumonia due to COVID-19 virus  AKI (acute kidney injury) ([place])  Diarrhea, unspecified type    Rx / DC Orders ED Discharge Orders    None       Marcello Fennel, PA-C 10/07/19 2349    Noemi Chapel, MD 10/08/19 1422

## 2019-10-07 NOTE — ED Notes (Signed)
Pt returned from CT °

## 2019-10-07 NOTE — ED Provider Notes (Signed)
This patient is a 74 year old female, recently admitted to the hospital for COVID-19, she is on 2 L of nasal cannula at home.  She has known COPD, she was admitted to the hospital for COVID-19 infection, was placed on dexamethasone for several days at discharge as well as remdesivir infusion which was completed.  She presents to the hospital today with a complaint of diarrhea abdominal discomfort but also having some shortness of breath.  She is febrile, she has a white count of 39,000, she has a diffusely tender abdomen without focal guarding distention or peritoneal signs.  She has a chest x-ray showing ongoing infiltrates in the lungs but not overall significantly changed from prior.  The patient will need a CT scan of her abdomen and pelvis, she could be septic given her severe leukocytosis, presence of a fever and her tachycardia.  She was given antibiotics after cultures were obtained to cover for possible pneumonia but abdomen CT was ordered after I examined the patient due to concern for intra-abdominal pathology as well.  Vitals:   10/07/19 2005 10/07/19 2009 10/07/19 2027 10/07/19 2029  BP: 120/71     Pulse: (!) 115 (!) 107  (!) 109  Resp: (!) 24 20  (!) 23  Temp:   (!) 100.7 F (38.2 C)   TempSrc:   Rectal   SpO2: 97% 97%  95%  Weight:      Height:       Marilyn Solomon was evaluated in Emergency Department on 10/07/2019 for the symptoms described in the history of present illness. She was evaluated in the context of the global COVID-19 pandemic, which necessitated consideration that the patient might be at risk for infection with the SARS-CoV-2 virus that causes COVID-19. Institutional protocols and algorithms that pertain to the evaluation of patients at risk for COVID-19 are in a state of rapid change based on information released by regulatory bodies including the CDC and federal and state organizations. These policies and algorithms were followed during the patient's care in the  ED.  I discussed this case with Dr. Olevia Bowens who will admit the patient to the hospital.  She technically has sepsis with fever, tachycardia, severe leukocytosis and a source of infection being pneumonia, she also had intra-abdominal pain with diarrhea, required 30 cc/kg of IV fluids but did not make any urine, persistent hypotension, will need to be admitted to the hospital.  High level of care, critical care provided  .Critical Care Performed by: Noemi Chapel, MD Authorized by: Noemi Chapel, MD   Critical care provider statement:    Critical care time (minutes):  35   Critical care time was exclusive of:  Separately billable procedures and treating other patients and teaching time   Critical care was necessary to treat or prevent imminent or life-threatening deterioration of the following conditions:  Sepsis   Critical care was time spent personally by me on the following activities:  Blood draw for specimens, development of treatment plan with patient or surrogate, discussions with consultants, evaluation of patient's response to treatment, examination of patient, obtaining history from patient or surrogate, ordering and performing treatments and interventions, ordering and review of laboratory studies, ordering and review of radiographic studies, pulse oximetry, re-evaluation of patient's condition and review of old charts     Medical screening examination/treatment/procedure(s) were conducted as a shared visit with non-physician practitioner(s) and myself.  I personally evaluated the patient during the encounter.  Clinical Impression:   Final diagnoses:  Sepsis associated hypotension ([place])  Pneumonia due to COVID-19 virus  AKI (acute kidney injury) ([place])         Noemi Chapel, MD 10/07/19 2309

## 2019-10-07 NOTE — H&P (Signed)
History and Physical    Marilyn Solomon:[id number] DOB: [birth year] DOA: 10/07/2019  PCP: Lucianne Lei, MD   Patient coming from: Home.  I have personally briefly reviewed patient's old medical records in [place]  Chief Complaint: Vomiting, diarrhea and weakness.  HPI: Marilyn Solomon is a 74 y.o. female with medical history significant of anemia, anxiety, osteoarthritis, chronic back pain, COPD, GERD, hypertension, hypothyroidism who was admitted from 501 2021 until 505 2021 due to COVID-19 pneumonia and now is coming to the emergency department due to having multiple episodes of emesis since the weekend associated with 3 loose stools BM daily since she was discharged.  The stools look dark, but no melena or hematochezia.  No dysuria, frequency or hematuria.  She has used Imodium without significant relief. She states that she feels very weak and her appetite is decreased. She mentions that she has been coughing greenish phlegm since she was discharged.  She has had a subjective fever, chills, but denies night sweats.  No rhinorrhea, sore throat, wheezing or hemoptysis.  Denies chest pain, palpitations, diaphoresis, PND, orthopnea or pitting edema of the lower extremities.  She denies polyuria, polydipsia, polyphagia or blurred vision.  ED Course: Initial vital signs were temperature 99.4 F, pulse 121, respirations 18, blood pressure 113/70 mmHg and O2 sat 96% on 2 L of nasal cannula oxygen.  The patient was given 1750 mL of LR bolus, ceftriaxone and Rocephin.  CBC shows a white count of 39.0 with 92% neutrophils, 2% lymphocytes and 4% monocytes.  Hemoglobin 10.6 g/dL and platelets 494.  PT was 13.9, INR 1.1 and APTT 27.  Lactic acid x2 was normal.  BMP showed a chloride of 94 mmol/L.  All other electrolytes are within expected range when calcium is corrected to albumin.  Glucose 145, BUN 36 and creatinine 1.14 mg/dL.  LFTs show an albumin of 2.3 g/dL, all other values are normal.   Chest radiograph shows COPD with patchy bilateral airspace disease slightly worse since previous study.  Imaging: CT abdomen/pelvis with contrast showed diverticulosis without diverticulitis and changes on the lung bases compatible with COVID-19 pneumonia.  Please see images and full radiology report for further detail.  Review of Systems: As per HPI otherwise all other systems reviewed and are negative.  Past Medical History:  Diagnosis Date  . Anemia   . Anxiety   . Arthritis   . Chronic back pain   . COPD (chronic obstructive pulmonary disease) ([place])   . GERD (gastroesophageal reflux disease)   . Hypertension   . Hypothyroidism   . Reflux    no medications currently, asymptomatic  . Thyroid disease     Past Surgical History:  Procedure Laterality Date  . ABDOMINAL HYSTERECTOMY    . BIOPSY  10/08/2017   Procedure: BIOPSY;  Surgeon: Daneil Dolin, MD;  [facility];  Service: Endoscopy;;  gastric  . cataracts Bilateral   . COLONOSCOPY  2007   Dr. Gala Romney: normal rectum, diverticula   . COLONOSCOPY WITH PROPOFOL N/A 11/15/2015   Dr. Gala Romney: grade II hemorrhoids, diverticulosis  . COLONOSCOPY WITH PROPOFOL N/A 10/08/2017   Procedure: COLONOSCOPY WITH PROPOFOL;  Surgeon: Daneil Dolin, MD;  [facility];  Service: Endoscopy;  Laterality: N/A;  11:15am  . ESOPHAGOGASTRODUODENOSCOPY (EGD) WITH PROPOFOL N/A 10/08/2017   Procedure: ESOPHAGOGASTRODUODENOSCOPY (EGD) WITH PROPOFOL;  Surgeon: Daneil Dolin, MD;  [facility];  Service: Endoscopy;  Laterality: N/A;  . FRACTURE SURGERY Right 2005  Wrist  . JOINT REPLACEMENT Left 2000   hip  . TOTAL HIP ARTHROPLASTY Left   . WRIST SURGERY Right     Social History  reports that she quit smoking about 21 years ago. Her smoking use included cigarettes. She has a 25.00 pack-year smoking history. She has never used smokeless tobacco. She reports that she does not drink alcohol or use drugs.  Allergies    Allergen Reactions  . Penicillins Itching       . Vicodin [Hydrocodone-Acetaminophen] Nausea And Vomiting    Family History  Problem Relation Age of Onset  . Bone cancer Sister   . Pancreatic cancer Sister   . Pneumonia Mother   . Heart attack Father   . Diabetes Sister   . Dementia Sister   . Colon cancer Neg Hx    Prior to Admission medications   Medication Sig Start Date End Date Taking? Authorizing Provider  albuterol (VENTOLIN HFA) 108 (90 Base) MCG/ACT inhaler Inhale 1-2 puffs into the lungs every 6 (six) hours as needed for wheezing or shortness of breath.   Yes [provider]  ALPRAZolam Duanne Moron) 1 MG tablet Take 1 mg by mouth every 8 (eight) hours as needed for anxiety.  08/10/14  Yes [provider]  ascorbic acid (VITAMIN C) 500 MG tablet Take 1 tablet (500 mg total) by mouth daily for 10 days. 10/02/19 10/12/19 Yes Shah, Pratik D, DO  dextromethorphan (DELSYM) 30 MG/5ML liquid Take 2.5 mLs (15 mg total) by mouth every 6 (six) hours as needed for cough. 10/01/19  Yes Shah, Pratik D, DO  Fluticasone-Umeclidin-Vilant (TRELEGY ELLIPTA) 100-62.5-25 MCG/INH AEPB Inhale 1 puff into the lungs daily.  12/25/18  Yes Sinda Du, MD  levothyroxine (SYNTHROID) 50 MCG tablet Take 50 mcg by mouth daily.  09/11/18  Yes [provider]  oxyCODONE (ROXICODONE) 15 MG immediate release tablet Take 15 mg by mouth 3 (three) times daily as needed for pain.    Yes [provider]  pantoprazole (PROTONIX) 40 MG tablet TAKE 1 TABLET BY MOUTH DAILY. Patient taking differently: Take 40 mg by mouth daily as needed (for acid reflux).  05/27/18  Yes Annitta Needs, NP  Vitamin D, Ergocalciferol, (DRISDOL) 1.25 MG (50000 UNIT) CAPS capsule Take 50,000 Units by mouth every 7 (seven) days.   Yes [provider]  dexamethasone (DECADRON) 4 MG tablet Take 6 mg by mouth daily. 3 day course starting on 10/01/2019 10/01/19   [provider]  ipratropium-albuterol  (DUONEB) 0.5-2.5 (3) MG/3ML SOLN Take 3 mLs by nebulization every 6 (six) hours as needed (shortness of breath or wheezing). 06/04/18 11/07/18  Manuella Ghazi, Pratik D, DO  zinc sulfate 220 (50 Zn) MG capsule Take 1 capsule (220 mg total) by mouth daily for 10 days. Patient not taking: Reported on 10/07/2019 10/02/19 10/12/19  Heath Lark D, DO    Physical Exam: Vitals:   10/07/19 2238 10/07/19 2245 10/07/19 2300 10/07/19 2306  BP:   (!) 90/53 (!) 105/58  Pulse: 91 95 94 99  Resp: (!) 24 (!) 24 (!) 25 (!) 23  Temp:      TempSrc:      SpO2: 99% 97% 98% 98%  Weight:      Height:        Constitutional: Looks acutely ill. Eyes: PERRL, lids and conjunctivae mildly injected. ENMT: Mucous membranes are mildly dry. Posterior pharynx clear of any exudate or lesions. Neck: normal, supple, no masses, no thyromegaly Respiratory: Mildly tachypneic, no wheezing, bibasilar  crackles.  No accessory muscle use.  Cardiovascular: Regular rate and rhythm, no murmurs / rubs / gallops. No extremity edema. 2+ pedal pulses. No carotid bruits.  Abdomen: Nondistended. Bowel sounds positive.  Soft, positive for epigastric tenderness, no guarding or rebound, no masses palpated. No hepatosplenomegaly.  Musculoskeletal: Generalized weakness.  No clubbing / cyanosis.  Good ROM, no contractures. Normal muscle tone.  Skin: Ecchymosis on lower abdomen from Lovenox injection.  Ecchymosis on dorsal aspect of the right hand.  Multiple small areas of ecchymosis on extremities seen on very limited dermatological exam. Neurologic: CN 2-12 grossly intact. Sensation intact, DTR normal. Strength 5/5 in all 4.  Psychiatric: Normal judgment and insight. Alert and oriented x 3. Normal mood.   Labs on Admission: I have personally reviewed following labs and imaging studies  CBC: Recent Labs  Lab 10/01/19 0500 10/07/19 1839  WBC 18.2* 39.0*  NEUTROABS 15.1* 35.8*  HGB 9.5* 10.6*  HCT 32.5* 36.3  MCV 89.3 89.9  PLT 357 494*    Basic  Metabolic Panel: Recent Labs  Lab 10/01/19 0500 10/07/19 1839  NA 143 137  K 4.3 4.5  CL 108 94*  CO2 30 31  GLUCOSE 155* 145*  BUN 29* 36*  CREATININE 0.79 1.14*  CALCIUM 8.5* 8.3*    GFR: Estimated Creatinine Clearance: 35.9 mL/min (A) (by C-G formula based on SCr of 1.14 mg/dL (H)).  Liver Function Tests: Recent Labs  Lab 10/01/19 0500 10/07/19 1839  AST 21 17  ALT 13 12  ALKPHOS 67 90  BILITOT 0.3 0.8  PROT 6.4* 7.8  ALBUMIN 2.4* 2.3*   Radiological Exams on Admission: CT ABDOMEN PELVIS W CONTRAST  Result Date: 10/07/2019 CLINICAL DATA:  Abdominal pain and elevated white blood cell count, history of COVID-19 positivity EXAM: CT ABDOMEN AND PELVIS WITH CONTRAST TECHNIQUE: Multidetector CT imaging of the abdomen and pelvis was performed using the standard protocol following bolus administration of intravenous contrast. CONTRAST:  79mL OMNIPAQUE IOHEXOL 300 MG/ML  SOLN COMPARISON:  Chest x-ray from earlier in the same day. FINDINGS: Lower chest: Lung bases show patchy opacities with a somewhat nodular component consistent with the patient's given clinical history of COVID-19 positivity. No sizable effusion is seen. Hepatobiliary: No focal liver abnormality is seen. No gallstones, gallbladder wall thickening, or biliary dilatation. Pancreas: Unremarkable. No pancreatic ductal dilatation or surrounding inflammatory changes. Spleen: Normal in size without focal abnormality. Adrenals/Urinary Tract: Adrenal glands are within normal limits. Kidneys demonstrate a normal enhancement pattern. No renal calculi or obstructive changes are noted. Small 1 cm cyst is noted within the left kidney. No ureteral stones are noted. The bladder is partially distended. Stomach/Bowel: Scattered diverticular change of the colon is noted without definitive diverticulitis. The appendix is not well visualized although no inflammatory changes are seen to suggest appendicitis. Small bowel shows no obstructive  change. No gastric abnormality is seen. Vascular/Lymphatic: Aortic atherosclerosis. No enlarged abdominal or pelvic lymph nodes. Reproductive: Status post hysterectomy. No adnexal masses. Other: No abdominal wall hernia or abnormality. No abdominopelvic ascites. Musculoskeletal: Left hip replacement is noted. No acute bony abnormality is seen. IMPRESSION: Diverticulosis without evidence of diverticulitis. Changes in the lung bases consistent with the given clinical history of COVID-19 positivity. No other focal abnormality is seen. Electronically Signed   By: Inez Catalina M.D.   On: 10/07/2019 21:51   DG Chest Port 1 View  Result Date: 10/07/2019 CLINICAL DATA:  COVID, shortness of breath EXAM: PORTABLE CHEST 1 VIEW COMPARISON:  09/27/2019 FINDINGS: Lungs  remain hyperinflated. Patchy bilateral airspace opacities and interstitial prominence throughout the lungs. Overall, patchy opacities slightly increased since prior study. Heart is normal size. No effusions or pneumothorax. No acute bony abnormality. IMPRESSION: COPD. Patchy bilateral airspace disease slightly worsening since prior study Electronically Signed   By: Rolm Baptise M.D.   On: 10/07/2019 19:23   IMPRESSIONS   1. The left ventricle has normal systolic function with an ejection  fraction of 60-65%. The cavity size was normal. Left ventricular diastolic  Doppler parameters are consistent with impaired relaxation.  2. The right ventricle has normal systolic function. The cavity was  normal. There is no increase in right ventricular wall thickness.   EKG: Independently reviewed.  Vent. rate 111 BPM PR interval * ms QRS duration 85 ms QT/QTc 321/437 ms P-R-T axes 80 70 62 Sinus tachycardia Right atrial enlargement Minimal ST depression, inferior leads Baseline wander in lead(s) V2  Assessment/Plan Principal Problem:   Sepsis due to undetermined organism (HCC)\ No obvious source.  Lungs?  GI? Admit to  stepdown/inpatient. Continue supplemental oxygen. Continue gentle IV hydration. Bronchodilators as needed. Continue azithromycin 500 mg IVP every 24 hours. Continue ceftriaxone 1 g IVPB every 24 hours. Check sputum Gram stain, culture and sensitivity. Check strep pneumoniae urinary antigen.  Active Problems:   COPD exacerbation (HCC) Continue supplemental oxygen. Bronchodilators as needed.    Acute gastroenteritis Continue IV fluids. If no improvement, check GI pathogen.    Hypothyroidism Continue levothyroxine 50 mcg p.o. daily.    GERD (gastroesophageal reflux disease) Continue PPI.    Normocytic anemia Iron deficient in January 2020 anemia panel. Monitor hematocrit and hemoglobin.    Leukocytosis Secondary to infection and recent glucocorticoid use. Monitor white blood cell count.    Hypertension Currently hypotensive. Hold antihypertensives. Monitor blood pressure.    Hyperglycemia Had recent course of glucocorticoids. Check fasting glucose while in the hospital.   DVT prophylaxis: Lovenox SQ. Code Status:   Full code. Family Communication: Disposition Plan:   Patient is from:  Home.  Anticipated DC to:  Home.  Anticipated DC date:  10/09/2019.  Anticipated DC barriers: Clinical improvement.  Consults called: Admission status:  Inpatient/stepdown.  Severity of Illness:  Reubin Milan MD [facility]  How to contact the [facility] Attending or Consulting provider [place] or covering provider during after hours [place], for this patient?   1. Check the care team in [facility] and look for a) attending/consulting [facility] provider listed and b) the [facility] team listed 2. Log into [email or web address] and use [place]'s universal password to access. If you do not have the password, please contact the hospital operator. 3. Locate the [facility] provider you are looking for under [facility] and page to a number that you can be directly reached. 4. If you still have  difficulty reaching the provider, please page the [facility] (Director on Call) for the Hospitalists listed on amion for assistance.  10/07/2019, 11:50 PM   This document was prepared using Dragon voice recognition software and may contain some unintended transcription errors.

## 2019-10-07 NOTE — ED Notes (Signed)
Pt to CT

## 2019-10-07 NOTE — ED Triage Notes (Signed)
Pt presents to ED with complaints of emesis since the weekend. Pt states "I came to the hospital today because I just feel bad and don't feel like I'm getting any better." Pt positive for Covid on 09/27/2019

## 2019-10-08 DIAGNOSIS — D649 Anemia, unspecified: Secondary | ICD-10-CM

## 2019-10-08 DIAGNOSIS — K529 Noninfective gastroenteritis and colitis, unspecified: Secondary | ICD-10-CM

## 2019-10-08 DIAGNOSIS — A0472 Enterocolitis due to Clostridium difficile, not specified as recurrent: Secondary | ICD-10-CM | POA: Diagnosis present

## 2019-10-08 DIAGNOSIS — K219 Gastro-esophageal reflux disease without esophagitis: Secondary | ICD-10-CM

## 2019-10-08 DIAGNOSIS — D72829 Elevated white blood cell count, unspecified: Secondary | ICD-10-CM

## 2019-10-08 DIAGNOSIS — R0603 Acute respiratory distress: Secondary | ICD-10-CM

## 2019-10-08 HISTORY — DX: Enterocolitis due to Clostridium difficile, not specified as recurrent: A04.72

## 2019-10-08 LAB — COMPREHENSIVE METABOLIC PANEL
ALT: 9 U/L (ref 0–44)
AST: 13 U/L — ABNORMAL LOW (ref 15–41)
Albumin: 1.6 g/dL — ABNORMAL LOW (ref 3.5–5.0)
Alkaline Phosphatase: 60 U/L (ref 38–126)
Anion gap: 7 (ref 5–15)
BUN: 28 mg/dL — ABNORMAL HIGH (ref 8–23)
CO2: 32 mmol/L (ref 22–32)
Calcium: 7.4 mg/dL — ABNORMAL LOW (ref 8.9–10.3)
Chloride: 100 mmol/L (ref 98–111)
Creatinine, Ser: 0.85 mg/dL (ref 0.44–1.00)
GFR calc Af Amer: >60 mL/min (ref >60)
GFR calc non Af Amer: >60 mL/min (ref >60)
Glucose, Bld: 87 mg/dL (ref 70–99)
Potassium: 3.9 mmol/L (ref 3.5–5.1)
Sodium: 139 mmol/L (ref 135–145)
Total Bilirubin: 0.5 mg/dL (ref 0.3–1.2)
Total Protein: 5.5 g/dL — ABNORMAL LOW (ref 6.5–8.1)

## 2019-10-08 LAB — VITAMIN D 25 HYDROXY (VIT D DEFICIENCY, FRACTURES): Vit D, 25-Hydroxy: 42.7 ng/mL (ref 30–100)

## 2019-10-08 LAB — CBC WITH DIFFERENTIAL/PLATELET
Abs Immature Granulocytes: 0.18 10*3/uL — ABNORMAL HIGH (ref 0.00–0.07)
Basophils Absolute: 0 10*3/uL (ref 0.0–0.1)
Basophils Relative: 0 %
Eosinophils Absolute: 0 10*3/uL (ref 0.0–0.5)
Eosinophils Relative: 0 %
HCT: 26.2 % — ABNORMAL LOW (ref 36.0–46.0)
Hemoglobin: 7.6 g/dL — ABNORMAL LOW (ref 12.0–15.0)
Immature Granulocytes: 1 %
Lymphocytes Relative: 4 %
Lymphs Abs: 0.7 10*3/uL (ref 0.7–4.0)
MCH: 26.1 pg (ref 26.0–34.0)
MCHC: 29 g/dL — ABNORMAL LOW (ref 30.0–36.0)
MCV: 90 fL (ref 80.0–100.0)
Monocytes Absolute: 0.8 10*3/uL (ref 0.1–1.0)
Monocytes Relative: 5 %
Neutro Abs: 14.3 10*3/uL — ABNORMAL HIGH (ref 1.7–7.7)
Neutrophils Relative %: 90 %
Platelets: 278 10*3/uL (ref 150–400)
RBC: 2.91 MIL/uL — ABNORMAL LOW (ref 3.87–5.11)
RDW: 15.1 % (ref 11.5–15.5)
WBC: 16 10*3/uL — ABNORMAL HIGH (ref 4.0–10.5)
nRBC: 0 % (ref 0.0–0.2)

## 2019-10-08 LAB — PROCALCITONIN: Procalcitonin: 0.2 ng/mL

## 2019-10-08 LAB — C DIFFICILE QUICK SCREEN W PCR REFLEX
C Diff antigen: POSITIVE — AB
C Diff toxin: NEGATIVE

## 2019-10-08 LAB — CLOSTRIDIUM DIFFICILE BY PCR, REFLEXED: Toxigenic C. Difficile by PCR: POSITIVE — AB

## 2019-10-08 LAB — OCCULT BLOOD X 1 CARD TO LAB, STOOL: Fecal Occult Bld: POSITIVE — AB

## 2019-10-08 MED ORDER — FLUTICASONE FUROATE-VILANTEROL 100-25 MCG/INH IN AEPB
1.0000 | INHALATION_SPRAY | Freq: Every day | RESPIRATORY_TRACT | Status: DC
  Administered 2019-10-09 – 2019-10-10 (×2): 1 via RESPIRATORY_TRACT
  Filled 2019-10-08: qty 28

## 2019-10-08 MED ORDER — DEXTROMETHORPHAN POLISTIREX ER 30 MG/5ML PO SUER
15.0000 mg | Freq: Two times a day (BID) | ORAL | Status: DC | PRN
  Administered 2019-10-08 – 2019-10-09 (×2): 15 mg via ORAL
  Filled 2019-10-08 (×3): qty 5

## 2019-10-08 MED ORDER — VITAMIN D (ERGOCALCIFEROL) 1.25 MG (50000 UNIT) PO CAPS: 50000.0000 [IU] | ORAL_CAPSULE | ORAL | Status: DC

## 2019-10-08 MED ORDER — FLUTICASONE-UMECLIDIN-VILANT 100-62.5-25 MCG/INH IN AEPB: 1.0000 | INHALATION_SPRAY | Freq: Every day | RESPIRATORY_TRACT | Status: DC

## 2019-10-08 MED ORDER — ONDANSETRON HCL 4 MG PO TABS: 4.0000 mg | ORAL_TABLET | Freq: Four times a day (QID) | ORAL | Status: DC | PRN

## 2019-10-08 MED ORDER — ALPRAZOLAM 0.5 MG PO TABS: 0.5000 mg | ORAL_TABLET | Freq: Three times a day (TID) | ORAL | Status: DC | PRN

## 2019-10-08 MED ORDER — PANTOPRAZOLE SODIUM 40 MG PO TBEC
40.0000 mg | DELAYED_RELEASE_TABLET | Freq: Every day | ORAL | Status: DC
  Administered 2019-10-08 – 2019-10-10 (×3): 40 mg via ORAL
  Filled 2019-10-08 (×3): qty 1

## 2019-10-08 MED ORDER — ALPRAZOLAM 0.5 MG PO TABS: 1.0000 mg | ORAL_TABLET | Freq: Three times a day (TID) | ORAL | Status: DC | PRN

## 2019-10-08 MED ORDER — ENOXAPARIN SODIUM 30 MG/0.3ML ~~LOC~~ SOLN
30.0000 mg | SUBCUTANEOUS | Status: DC
Start: 2019-10-08 — End: 2019-10-08

## 2019-10-08 MED ORDER — ONDANSETRON HCL 4 MG/2ML IJ SOLN: 4.0000 mg | Freq: Four times a day (QID) | INTRAMUSCULAR | Status: DC | PRN

## 2019-10-08 MED ORDER — ASCORBIC ACID 500 MG PO TABS
500.0000 mg | ORAL_TABLET | Freq: Every day | ORAL | Status: DC
  Administered 2019-10-08 – 2019-10-10 (×3): 500 mg via ORAL
  Filled 2019-10-08 (×3): qty 1

## 2019-10-08 MED ORDER — OXYCODONE HCL 5 MG PO TABS: 5.0000 mg | ORAL_TABLET | ORAL | Status: DC | PRN

## 2019-10-08 MED ORDER — SODIUM CHLORIDE 0.9 % IV SOLN: INTRAVENOUS | Status: DC

## 2019-10-08 MED ORDER — VANCOMYCIN 50 MG/ML ORAL SOLUTION
125.0000 mg | Freq: Four times a day (QID) | ORAL | Status: DC
  Administered 2019-10-08 – 2019-10-10 (×8): 125 mg via ORAL
  Filled 2019-10-08 (×20): qty 2.5

## 2019-10-08 MED ORDER — ACETAMINOPHEN 325 MG PO TABS
650.0000 mg | ORAL_TABLET | Freq: Four times a day (QID) | ORAL | Status: DC | PRN
  Administered 2019-10-08: 650 mg via ORAL
  Filled 2019-10-08: qty 2

## 2019-10-08 MED ORDER — ACETAMINOPHEN 650 MG RE SUPP: 650.0000 mg | Freq: Four times a day (QID) | RECTAL | Status: DC | PRN

## 2019-10-08 MED ORDER — UMECLIDINIUM BROMIDE 62.5 MCG/INH IN AEPB
1.0000 | INHALATION_SPRAY | Freq: Every day | RESPIRATORY_TRACT | Status: DC
  Administered 2019-10-09 – 2019-10-10 (×2): 1 via RESPIRATORY_TRACT
  Filled 2019-10-08: qty 7

## 2019-10-08 MED ORDER — IPRATROPIUM-ALBUTEROL 0.5-2.5 (3) MG/3ML IN SOLN: 3.0000 mL | Freq: Four times a day (QID) | RESPIRATORY_TRACT | Status: DC | PRN

## 2019-10-08 MED ORDER — LEVOTHYROXINE SODIUM 50 MCG PO TABS
50.0000 ug | ORAL_TABLET | Freq: Every day | ORAL | Status: DC
  Administered 2019-10-08 – 2019-10-10 (×3): 50 ug via ORAL
  Filled 2019-10-08 (×2): qty 1
  Filled 2019-10-08: qty 2

## 2019-10-08 NOTE — Progress Notes (Signed)
PROGRESS NOTE   Marilyn Solomon  OHY:[id number] DOB: Jul 11, 1945 DOA: 10/07/2019 PCP: Lucianne Lei, MD   Chief Complaint  Patient presents with  . Emesis    Brief Narrative:  74 y.o. female with medical history significant of anemia, anxiety, osteoarthritis, chronic back pain, COPD, GERD, hypertension, hypothyroidism who was admitted from 09/27/2019 until 10/01/19 due to COVID-19 pneumonia and now is coming to the emergency department due to having multiple episodes of emesis since the weekend associated with 3 loose stools BM daily since she was discharged.    Assessment & Plan:   Principal Problem:   Sepsis due to undetermined organism [facility]) Active Problems:   Hypothyroidism   GERD (gastroesophageal reflux disease)   Normocytic anemia   Leukocytosis   COPD exacerbation (HCC)   Hypertension   Hyperglycemia   Acute gastroenteritis   Acute respiratory distress   1. C diff diarrhea - Pt started on oral vancomycin for moderate to nonsevere disease with no sepsis presentation.  Continue precautions as ordered.  2. Covid pneumonia - Pt was fully treated during recent hospitalization, continue isolation.   3. Leukocytosis - secondary to C diff infection, follow CBC/diff.  4. Anemia with heme positive stool - ? If this is from active C diff infection, will ask for GI consultation.  5. Hypertension - stable.  6. Generalized weakness - multifactorial, will get PT eval when more medically stable.  7. GERD - protonix for GI protection.  8. Hypothyroidism - stable.    DVT prophylaxis: SCD Code Status:  Full  Family Communication:  Disposition:   Status is: Inpatient  Remains inpatient appropriate because:IV treatments appropriate due to intensity of illness or inability to take PO   Dispo: The patient is from: Home              Anticipated d/c is to: Home              Anticipated d/c date is: 3 days              Patient currently is not medically stable to d/c.    Consultants:    Procedures:     Antimicrobials: vancomycin oral 5/12>>    Subjective: Pt reports that stools have been a little more formed this morning.   Objective: Vitals:   10/08/19 1400 10/08/19 1430 10/08/19 1500 10/08/19 1530  BP: 133/63 116/63 (!) 126/55 129/64  Pulse: 100 92 85 91  Resp: (!) 24 (!) 22 (!) 22 (!) 21  Temp:      TempSrc:      SpO2: 96% 97% 100% 100%  Weight:      Height:        Intake/Output Summary (Last 24 hours) at 10/08/2019 1637 Last data filed at 10/08/2019 0930 Gross per 24 hour  Intake 2584 ml  Output --  Net 2584 ml   Filed Weights   10/07/19 1802  Weight: 51.7 kg    Examination:  General exam: Appears calm and comfortable, pale emaciated, chronically ill appearing female.  Respiratory system: Clear to auscultation. Respiratory effort normal. Cardiovascular system: S1 & S2 heard, RRR. No JVD, murmurs, rubs, gallops or clicks. No pedal edema. Gastrointestinal system: Abdomen is nondistended, soft and nontender. No organomegaly or masses felt. Normal bowel sounds heard. Central nervous system: Alert and oriented. No focal neurological deficits. Extremities: Symmetric 5 x 5 power. Skin: No rashes, lesions or ulcers Psychiatry: Judgement and insight appear normal. Mood & affect appropriate.   Data Reviewed: I have personally reviewed  following labs and imaging studies  CBC: Recent Labs  Lab 10/07/19 1839 10/08/19 0327  WBC 39.0* 16.0*  NEUTROABS 35.8* 14.3*  HGB 10.6* 7.6*  HCT 36.3 26.2*  MCV 89.9 90.0  PLT 494* 767    Basic Metabolic Panel: Recent Labs  Lab 10/07/19 1839 10/08/19 0327  NA 137 139  K 4.5 3.9  CL 94* 100  CO2 31 32  GLUCOSE 145* 87  BUN 36* 28*  CREATININE 1.14* 0.85  CALCIUM 8.3* 7.4*    GFR: Estimated Creatinine Clearance: 48.1 mL/min (by C-G formula based on SCr of 0.85 mg/dL).  Liver Function Tests: Recent Labs  Lab 10/07/19 1839 10/08/19 0327  AST 17 13*  ALT 12 9  ALKPHOS 90 60  BILITOT 0.8  0.5  PROT 7.8 5.5*  ALBUMIN 2.3* 1.6*    CBG: No results for input(s): GLUCAP in the last 168 hours.   Recent Results (from the past 240 hour(s))  Blood Culture (routine x 2)     Status: None (Preliminary result)   Collection Time: 10/07/19  7:54 PM   Specimen: BLOOD RIGHT ARM  Result Value Ref Range Status   Specimen Description BLOOD RIGHT ARM  Final   Special Requests   Final    BOTTLES DRAWN AEROBIC AND ANAEROBIC Blood Culture adequate volume   Culture   Final    NO GROWTH < 12 HOURS Performed at [facility], [address], [place], [place] [address]    Report Status PENDING  Incomplete  Blood Culture (routine x 2)     Status: None (Preliminary result)   Collection Time: 10/07/19  8:07 PM   Specimen: BLOOD RIGHT HAND  Result Value Ref Range Status   Specimen Description BLOOD RIGHT HAND  Final   Special Requests   Final    BOTTLES DRAWN AEROBIC AND ANAEROBIC Blood Culture adequate volume   Culture   Final    NO GROWTH < 12 HOURS Performed at [facility], [address] [place], [place] [address]    Report Status PENDING  Incomplete  C Difficile Quick Screen w PCR reflex     Status: Abnormal   Collection Time: 10/08/19  7:42 AM   Specimen: STOOL  Result Value Ref Range Status   C Diff antigen POSITIVE (A) NEGATIVE Final   C Diff toxin NEGATIVE NEGATIVE Final   C Diff interpretation Results are indeterminate. See PCR results.  Final    Comment: Performed at [facility], [address], [place], [place] [address]  C. Diff by PCR, Reflexed     Status: Abnormal   Collection Time: 10/08/19  7:42 AM  Result Value Ref Range Status   Toxigenic C. Difficile by PCR POSITIVE (A) NEGATIVE Final    Comment: Positive for toxigenic C. difficile with little to no toxin production. Only treat if clinical presentation suggests symptomatic illness. Performed at [facility], [place] [address] [place], [place] [address]    Radiology Studies: CT ABDOMEN PELVIS W  CONTRAST  Result Date: 10/07/2019 CLINICAL DATA:  Abdominal pain and elevated white blood cell count, history of COVID-19 positivity EXAM: CT ABDOMEN AND PELVIS WITH CONTRAST TECHNIQUE: Multidetector CT imaging of the abdomen and pelvis was performed using the standard protocol following bolus administration of intravenous contrast. CONTRAST:  31mL OMNIPAQUE IOHEXOL 300 MG/ML  SOLN COMPARISON:  Chest x-ray from earlier in the same day. FINDINGS: Lower chest: Lung bases show patchy opacities with a somewhat nodular component consistent with the patient's given clinical history  of COVID-19 positivity. No sizable effusion is seen. Hepatobiliary: No focal liver abnormality is seen. No gallstones, gallbladder wall thickening, or biliary dilatation. Pancreas: Unremarkable. No pancreatic ductal dilatation or surrounding inflammatory changes. Spleen: Normal in size without focal abnormality. Adrenals/Urinary Tract: Adrenal glands are within normal limits. Kidneys demonstrate a normal enhancement pattern. No renal calculi or obstructive changes are noted. Small 1 cm cyst is noted within the left kidney. No ureteral stones are noted. The bladder is partially distended. Stomach/Bowel: Scattered diverticular change of the colon is noted without definitive diverticulitis. The appendix is not well visualized although no inflammatory changes are seen to suggest appendicitis. Small bowel shows no obstructive change. No gastric abnormality is seen. Vascular/Lymphatic: Aortic atherosclerosis. No enlarged abdominal or pelvic lymph nodes. Reproductive: Status post hysterectomy. No adnexal masses. Other: No abdominal wall hernia or abnormality. No abdominopelvic ascites. Musculoskeletal: Left hip replacement is noted. No acute bony abnormality is seen. IMPRESSION: Diverticulosis without evidence of diverticulitis. Changes in the lung bases consistent with the given clinical history of COVID-19 positivity. No other focal abnormality is  seen. Electronically Signed   By: Inez Catalina M.D.   On: 10/07/2019 21:51   DG Chest Port 1 View  Result Date: 10/07/2019 CLINICAL DATA:  COVID, shortness of breath EXAM: PORTABLE CHEST 1 VIEW COMPARISON:  09/27/2019 FINDINGS: Lungs remain hyperinflated. Patchy bilateral airspace opacities and interstitial prominence throughout the lungs. Overall, patchy opacities slightly increased since prior study. Heart is normal size. No effusions or pneumothorax. No acute bony abnormality. IMPRESSION: COPD. Patchy bilateral airspace disease slightly worsening since prior study Electronically Signed   By: Rolm Baptise M.D.   On: 10/07/2019 19:23   Scheduled Meds: . ascorbic acid  500 mg Oral Daily  . fluticasone furoate-vilanterol  1 puff Inhalation Daily   And  . umeclidinium bromide  1 puff Inhalation Daily  . levothyroxine  50 mcg Oral Daily  . pantoprazole  40 mg Oral Daily  . vancomycin  125 mg Oral QID  . Vitamin D (Ergocalciferol)  50,000 Units Oral Q7 days   Continuous Infusions: . sodium chloride 75 mL/hr at 10/08/19 1549     LOS: 1 day    Time spent: 25 mins   Erick Murin Wynetta Emery, MD [facility]   To contact the attending provider between 7A-7P or the covering provider during after hours 7P-7A, please log into the web site [email or web address] and access using universal [place] password for that web site. If you do not have the password, please call the hospital operator.  10/08/2019, 4:37 PM

## 2019-10-08 NOTE — Plan of Care (Signed)
  Problem: Clinical Measurements: Goal: Signs and symptoms of infection will decrease Outcome: Progressing   Problem: Respiratory: Goal: Ability to maintain adequate ventilation will improve Outcome: Progressing   Problem: Clinical Measurements: Goal: Ability to maintain clinical measurements within normal limits will improve Outcome: Progressing   Problem: Clinical Measurements: Goal: Will remain free from infection Outcome: Progressing   Problem: Clinical Measurements: Goal: Respiratory complications will improve Outcome: Progressing   Problem: Pain Managment: Goal: General experience of comfort will improve Outcome: Progressing   Problem: Safety: Goal: Ability to remain free from injury will improve Outcome: Progressing   Problem: Skin Integrity: Goal: Risk for impaired skin integrity will decrease Outcome: Progressing

## 2019-10-08 NOTE — ED Notes (Signed)
ED TO INPATIENT HANDOFF REPORT  ED Nurse Name and Phone #: [phone]  S Name/Age/Gender Marilyn Solomon 74 y.o. female Room/Bed: APA09/APA09  Code Status   Code Status: Full Code  Home/SNF/Other Home Patient oriented to: self, place, time and situation Is this baseline? Yes   Triage Complete: Triage complete  Chief Complaint Sepsis due to undetermined organism [facility]) [A41.9] Acute respiratory distress [R06.03] C. difficile diarrhea [A04.72]  Triage Note Pt presents to ED with complaints of emesis since the weekend. Pt states "I came to the hospital today because I just feel bad and don't feel like I'm getting any better." Pt positive for Covid on 09/27/2019    Allergies Allergies  Allergen Reactions  . Penicillins Itching       . Vicodin [Hydrocodone-Acetaminophen] Nausea And Vomiting    Level of Care/Admitting Diagnosis ED Disposition    ED Disposition Condition [facility] Area: [facility] [[id number]]  Level of Care: Med-Surg [16]  Covid Evaluation: Confirmed COVID Positive  Diagnosis: C. difficile diarrhea [703500]  Admitting Physician: [place], [place]  Attending Physician: Murlean Iba [[id number]]  Estimated length of stay: past midnight tomorrow  Certification:: I certify this patient will need inpatient services for at least 2 midnights       B Medical/Surgery History Past Medical History:  Diagnosis Date  . Anemia   . Anxiety   . Arthritis   . Chronic back pain   . COPD (chronic obstructive pulmonary disease) ([place])   . GERD (gastroesophageal reflux disease)   . Hypertension   . Hypothyroidism   . Reflux    no medications currently, asymptomatic  . Thyroid disease    Past Surgical History:  Procedure Laterality Date  . ABDOMINAL HYSTERECTOMY    . BIOPSY  10/08/2017   Procedure: BIOPSY;  Surgeon: Daneil Dolin, MD;  [facility];  Service: Endoscopy;;  gastric  . cataracts Bilateral   .  COLONOSCOPY  2007   Dr. Gala Romney: normal rectum, diverticula   . COLONOSCOPY WITH PROPOFOL N/A 11/15/2015   Dr. Gala Romney: grade II hemorrhoids, diverticulosis  . COLONOSCOPY WITH PROPOFOL N/A 10/08/2017   Procedure: COLONOSCOPY WITH PROPOFOL;  Surgeon: Daneil Dolin, MD;  Location[facility];  Service: Endoscopy;  Laterality: N/A;  11:15am  . ESOPHAGOGASTRODUODENOSCOPY (EGD) WITH PROPOFOL N/A 10/08/2017   Procedure: ESOPHAGOGASTRODUODENOSCOPY (EGD) WITH PROPOFOL;  Surgeon: Daneil Dolin, MD;  Location[facility];  Service: Endoscopy;  Laterality: N/A;  . FRACTURE SURGERY Right 2005   Wrist  . JOINT REPLACEMENT Left 2000   hip  . TOTAL HIP ARTHROPLASTY Left   . WRIST SURGERY Right      A IV Location/Drains/Wounds Patient Lines/Drains/Airways Status   Active Line/Drains/Airways    Name:   Placement date:   Placement time:   Site:   Days:   Peripheral IV 10/07/19 Left;Anterior;Upper Forearm   10/07/19    1810    Forearm   1          Intake/Output Last 24 hours  Intake/Output Summary (Last 24 hours) at 10/08/2019 1934 Last data filed at 10/08/2019 0930 Gross per 24 hour  Intake 2584 ml  Output --  Net 2584 ml    Labs/Imaging Results for orders placed or performed during the hospital encounter of 10/07/19 (from the past 48 hour(s))  Basic metabolic panel     Status: Abnormal   Collection Time: 10/07/19  6:39 PM  Result Value Ref Range  Sodium 137 135 - 145 mmol/L   Potassium 4.5 3.5 - 5.1 mmol/L   Chloride 94 (L) 98 - 111 mmol/L   CO2 31 22 - 32 mmol/L   Glucose, Bld 145 (H) 70 - 99 mg/dL    Comment: Glucose reference range applies only to samples taken after fasting for at least 8 hours.   BUN 36 (H) 8 - 23 mg/dL   Creatinine, Ser 1.14 (H) 0.44 - 1.00 mg/dL   Calcium 8.3 (L) 8.9 - 10.3 mg/dL   GFR calc non Af Amer 48 (L) >60 mL/min   GFR calc Af Amer 55 (L) >60 mL/min   Anion gap 12 5 - 15    Comment: Performed at [facility], [address] [place], [place]  [address]  CBC with Differential     Status: Abnormal   Collection Time: 10/07/19  6:39 PM  Result Value Ref Range   WBC 39.0 (H) 4.0 - 10.5 K/uL   RBC 4.04 3.87 - 5.11 MIL/uL   Hemoglobin 10.6 (L) 12.0 - 15.0 g/dL   HCT 36.3 36.0 - 46.0 %   MCV 89.9 80.0 - 100.0 fL   MCH 26.2 26.0 - 34.0 pg   MCHC 29.2 (L) 30.0 - 36.0 g/dL   RDW 14.9 11.5 - 15.5 %   Platelets 494 (H) 150 - 400 K/uL   nRBC 0.0 0.0 - 0.2 %   Neutrophils Relative % 92 %   Neutro Abs 35.8 (H) 1.7 - 7.7 K/uL   Lymphocytes Relative 2 %   Lymphs Abs 0.7 0.7 - 4.0 K/uL   Monocytes Relative 4 %   Monocytes Absolute 1.6 (H) 0.1 - 1.0 K/uL   Eosinophils Relative 0 %   Eosinophils Absolute 0.0 0.0 - 0.5 K/uL   Basophils Relative 0 %   Basophils Absolute 0.1 0.0 - 0.1 K/uL   Immature Granulocytes 2 %   Abs Immature Granulocytes 0.75 (H) 0.00 - 0.07 K/uL    Comment: Performed at [facility], [address] [place], [place] [address]  APTT     Status: None   Collection Time: 10/07/19  6:39 PM  Result Value Ref Range   aPTT 27 24 - 36 seconds    Comment: Performed at [facility], [address] [place], [place] [address]  Protime-INR     Status: None   Collection Time: 10/07/19  6:39 PM  Result Value Ref Range   Prothrombin Time 13.9 11.4 - 15.2 seconds   INR 1.1 0.8 - 1.2    Comment: (NOTE) INR goal varies based on device and disease states. Performed at [facility], [address] [place],  [address]   Hepatic function panel     Status: Abnormal   Collection Time: 10/07/19  6:39 PM  Result Value Ref Range   Total Protein 7.8 6.5 - 8.1 g/dL   Albumin 2.3 (L) 3.5 - 5.0 g/dL   AST 17 15 - 41 U/L   ALT 12 0 - 44 U/L   Alkaline Phosphatase 90 38 - 126 U/L   Total Bilirubin 0.8 0.3 - 1.2 mg/dL   Bilirubin, Direct 0.2 0.0 - 0.2 mg/dL   Indirect Bilirubin 0.6 0.3 - 0.9 mg/dL    Comment: Performed at [facility], [address], [place],  [address]  Blood Culture (routine x 2)     Status: None (Preliminary  result)   Collection Time: 10/07/19  7:54 PM   Specimen: BLOOD RIGHT ARM  Result Value Ref Range  Specimen Description BLOOD RIGHT ARM    Special Requests      BOTTLES DRAWN AEROBIC AND ANAEROBIC Blood Culture adequate volume   Culture      NO GROWTH < 12 HOURS Performed at [facility], [address] [place], [place] [address]    Report Status PENDING   Lactic acid, plasma     Status: None   Collection Time: 10/07/19  8:07 PM  Result Value Ref Range   Lactic Acid, Venous 1.3 0.5 - 1.9 mmol/L    Comment: Performed at [facility], [address] [place], [place] [address]  Blood Culture (routine x 2)     Status: None (Preliminary result)   Collection Time: 10/07/19  8:07 PM   Specimen: BLOOD RIGHT HAND  Result Value Ref Range   Specimen Description BLOOD RIGHT HAND    Special Requests      BOTTLES DRAWN AEROBIC AND ANAEROBIC Blood Culture adequate volume   Culture      NO GROWTH < 12 HOURS Performed at [facility], [address], [place], [place] [address]    Report Status PENDING   Procalcitonin - Baseline     Status: None   Collection Time: 10/07/19  8:07 PM  Result Value Ref Range   Procalcitonin 0.20 ng/mL    Comment:        Interpretation: PCT (Procalcitonin) <= 0.5 ng/mL: Systemic infection (sepsis) is not likely. Local bacterial infection is possible. (NOTE)       Sepsis PCT Algorithm           Lower Respiratory Tract                                      Infection PCT Algorithm    ----------------------------     ----------------------------         PCT < 0.25 ng/mL                PCT < 0.10 ng/mL         Strongly encourage             Strongly discourage   discontinuation of antibiotics    initiation of antibiotics    ----------------------------     -----------------------------       PCT 0.25 - 0.50 ng/mL            PCT 0.10 - 0.25 ng/mL               OR       >80% decrease in PCT            Discourage initiation of                                             antibiotics      Encourage discontinuation           of antibiotics    ----------------------------     -----------------------------         PCT >= 0.50 ng/mL              PCT 0.26 - 0.50 ng/mL               AND        <80% decrease in PCT  Encourage initiation of                                             antibiotics       Encourage continuation           of antibiotics    ----------------------------     -----------------------------        PCT >= 0.50 ng/mL                  PCT > 0.50 ng/mL               AND         increase in PCT                  Strongly encourage                                      initiation of antibiotics    Strongly encourage escalation           of antibiotics                                     -----------------------------                                           PCT <= 0.25 ng/mL                                                 OR                                        > 80% decrease in PCT                                     Discontinue / Do not initiate                                             antibiotics Performed at [facility], [address], [place], [place] [address]   Lactic acid, plasma     Status: None   Collection Time: 10/07/19 10:05 PM  Result Value Ref Range   Lactic Acid, Venous 1.1 0.5 - 1.9 mmol/L    Comment: Performed at [facility], [address], [place], [place] [address]  CBC WITH DIFFERENTIAL     Status: Abnormal   Collection Time: 10/08/19  3:27 AM  Result Value Ref Range   WBC 16.0 (H) 4.0 - 10.5 K/uL   RBC 2.91 (L) 3.87 - 5.11 MIL/uL   Hemoglobin 7.6 (L) 12.0 - 15.0 g/dL    Comment: REPEATED TO VERIFY   HCT 26.2 (L) 36.0 - 46.0 %  MCV 90.0 80.0 - 100.0 fL   MCH 26.1 26.0 - 34.0 pg   MCHC 29.0 (L) 30.0 - 36.0 g/dL   RDW 15.1 11.5 - 15.5 %   Platelets 278 150 - 400 K/uL   nRBC 0.0 0.0 - 0.2 %   Neutrophils Relative % 90 %   Neutro Abs 14.3 (H) 1.7 - 7.7 K/uL   Lymphocytes Relative 4 %   Lymphs  Abs 0.7 0.7 - 4.0 K/uL   Monocytes Relative 5 %   Monocytes Absolute 0.8 0.1 - 1.0 K/uL   Eosinophils Relative 0 %   Eosinophils Absolute 0.0 0.0 - 0.5 K/uL   Basophils Relative 0 %   Basophils Absolute 0.0 0.0 - 0.1 K/uL   Immature Granulocytes 1 %   Abs Immature Granulocytes 0.18 (H) 0.00 - 0.07 K/uL    Comment: Performed at [facility], [address] [place], [place] [address]  Comprehensive metabolic panel     Status: Abnormal   Collection Time: 10/08/19  3:27 AM  Result Value Ref Range   Sodium 139 135 - 145 mmol/L   Potassium 3.9 3.5 - 5.1 mmol/L   Chloride 100 98 - 111 mmol/L   CO2 32 22 - 32 mmol/L   Glucose, Bld 87 70 - 99 mg/dL    Comment: Glucose reference range applies only to samples taken after fasting for at least 8 hours.   BUN 28 (H) 8 - 23 mg/dL   Creatinine, Ser 0.85 0.44 - 1.00 mg/dL   Calcium 7.4 (L) 8.9 - 10.3 mg/dL   Total Protein 5.5 (L) 6.5 - 8.1 g/dL   Albumin 1.6 (L) 3.5 - 5.0 g/dL   AST 13 (L) 15 - 41 U/L   ALT 9 0 - 44 U/L   Alkaline Phosphatase 60 38 - 126 U/L   Total Bilirubin 0.5 0.3 - 1.2 mg/dL   GFR calc non Af Amer >60 >60 mL/min   GFR calc Af Amer >60 >60 mL/min   Anion gap 7 5 - 15    Comment: Performed at [facility], [address] [place], [place] [address]  C Difficile Quick Screen w PCR reflex     Status: Abnormal   Collection Time: 10/08/19  7:42 AM   Specimen: STOOL  Result Value Ref Range   C Diff antigen POSITIVE (A) NEGATIVE   C Diff toxin NEGATIVE NEGATIVE   C Diff interpretation Results are indeterminate. See PCR results.     Comment: Performed at [facility], [address] [place], [place] [address]  Occult blood card to lab, stool Provider will collect     Status: Abnormal   Collection Time: 10/08/19  7:42 AM  Result Value Ref Range   Fecal Occult Bld POSITIVE (A) NEGATIVE    Comment: Performed at [facility], [address] [place], [place] [address]  C. Diff by PCR, Reflexed     Status: Abnormal   Collection  Time: 10/08/19  7:42 AM  Result Value Ref Range   Toxigenic C. Difficile by PCR POSITIVE (A) NEGATIVE    Comment: Positive for toxigenic C. difficile with little to no toxin production. Only treat if clinical presentation suggests symptomatic illness. Performed at [facility], [place] [address] [place], [place] [address]    CT ABDOMEN PELVIS W CONTRAST  Result Date: 10/07/2019 CLINICAL DATA:  Abdominal pain and elevated white blood cell count, history of COVID-19 positivity EXAM: CT ABDOMEN AND PELVIS WITH CONTRAST TECHNIQUE: Multidetector CT imaging of the  abdomen and pelvis was performed using the standard protocol following bolus administration of intravenous contrast. CONTRAST:  74mL OMNIPAQUE IOHEXOL 300 MG/ML  SOLN COMPARISON:  Chest x-ray from earlier in the same day. FINDINGS: Lower chest: Lung bases show patchy opacities with a somewhat nodular component consistent with the patient's given clinical history of COVID-19 positivity. No sizable effusion is seen. Hepatobiliary: No focal liver abnormality is seen. No gallstones, gallbladder wall thickening, or biliary dilatation. Pancreas: Unremarkable. No pancreatic ductal dilatation or surrounding inflammatory changes. Spleen: Normal in size without focal abnormality. Adrenals/Urinary Tract: Adrenal glands are within normal limits. Kidneys demonstrate a normal enhancement pattern. No renal calculi or obstructive changes are noted. Small 1 cm cyst is noted within the left kidney. No ureteral stones are noted. The bladder is partially distended. Stomach/Bowel: Scattered diverticular change of the colon is noted without definitive diverticulitis. The appendix is not well visualized although no inflammatory changes are seen to suggest appendicitis. Small bowel shows no obstructive change. No gastric abnormality is seen. Vascular/Lymphatic: Aortic atherosclerosis. No enlarged abdominal or pelvic lymph nodes. Reproductive: Status post hysterectomy. No  adnexal masses. Other: No abdominal wall hernia or abnormality. No abdominopelvic ascites. Musculoskeletal: Left hip replacement is noted. No acute bony abnormality is seen. IMPRESSION: Diverticulosis without evidence of diverticulitis. Changes in the lung bases consistent with the given clinical history of COVID-19 positivity. No other focal abnormality is seen. Electronically Signed   By: Inez Catalina M.D.   On: 10/07/2019 21:51   DG Chest Port 1 View  Result Date: 10/07/2019 CLINICAL DATA:  COVID, shortness of breath EXAM: PORTABLE CHEST 1 VIEW COMPARISON:  09/27/2019 FINDINGS: Lungs remain hyperinflated. Patchy bilateral airspace opacities and interstitial prominence throughout the lungs. Overall, patchy opacities slightly increased since prior study. Heart is normal size. No effusions or pneumothorax. No acute bony abnormality. IMPRESSION: COPD. Patchy bilateral airspace disease slightly worsening since prior study Electronically Signed   By: Rolm Baptise M.D.   On: 10/07/2019 19:23    Pending Labs Unresulted Labs (From admission, onward)    Start     Ordered   10/09/19 0500  Comprehensive metabolic panel  Daily,   R     10/08/19 1646   10/09/19 0500  Magnesium  Tomorrow morning,   R     10/08/19 1646   10/08/19 1647  VITAMIN D 25 Hydroxy (Vit-D Deficiency, Fractures)  Add-on,   AD     10/08/19 1646   10/08/19 0500  CBC WITH DIFFERENTIAL  Daily,   R     10/08/19 0045   10/08/19 0051  Strep pneumoniae urinary antigen  Once,   STAT    Question:  Release to patient  Answer:  Immediate   10/08/19 0050   10/07/19 1943  Urinalysis, Routine w reflex microscopic  ONCE - STAT,   STAT     10/07/19 1943   10/07/19 1943  Urine culture  ONCE - STAT,   STAT     10/07/19 1943          Vitals/Pain Today's Vitals   10/08/19 1800 10/08/19 1830 10/08/19 1900 10/08/19 1930  BP: (!) 101/45 124/67 125/63 (!) 138/101  Pulse: 98 97 93   Resp: (!) 22 (!) 23 (!) 21 16  Temp:      TempSrc:      SpO2:  94% (!) 86% 91%   Weight:      Height:      PainSc:        Isolation Precautions Enteric precautions (  UV disinfection)  Medications Medications  acetaminophen (TYLENOL) tablet 650 mg (has no administration in time range)    Or  acetaminophen (TYLENOL) suppository 650 mg (has no administration in time range)  ondansetron (ZOFRAN) tablet 4 mg (has no administration in time range)    Or  ondansetron (ZOFRAN) injection 4 mg (has no administration in time range)  ascorbic acid (VITAMIN C) tablet 500 mg (500 mg Oral Given 10/08/19 1715)  dextromethorphan (DELSYM) 30 MG/5ML liquid 15 mg (has no administration in time range)  ipratropium-albuterol (DUONEB) 0.5-2.5 (3) MG/3ML nebulizer solution 3 mL (has no administration in time range)  levothyroxine (SYNTHROID) tablet 50 mcg (50 mcg Oral Given 10/08/19 1715)  oxyCODONE (Oxy IR/ROXICODONE) immediate release tablet 5 mg (has no administration in time range)  pantoprazole (PROTONIX) EC tablet 40 mg (40 mg Oral Given 10/08/19 1715)  0.9 %  sodium chloride infusion ( Intravenous Rate/Dose Change 10/08/19 1715)  fluticasone furoate-vilanterol (BREO ELLIPTA) 100-25 MCG/INH 1 puff (1 puff Inhalation Not Given 10/08/19 1716)    And  umeclidinium bromide (INCRUSE ELLIPTA) 62.5 MCG/INH 1 puff (1 puff Inhalation Not Given 10/08/19 1716)  vancomycin (VANCOCIN) 50 mg/mL oral solution 125 mg (125 mg Oral Given 10/08/19 1928)  ALPRAZolam (XANAX) tablet 0.5 mg (has no administration in time range)  Vitamin D (Ergocalciferol) (DRISDOL) capsule 50,000 Units (has no administration in time range)  lactated ringers bolus 1,000 mL (0 mLs Intravenous Stopped 10/07/19 2026)    And  lactated ringers bolus 500 mL (0 mLs Intravenous Stopped 10/07/19 2044)    And  lactated ringers bolus 250 mL (0 mLs Intravenous Stopped 10/07/19 2036)  iohexol (OMNIPAQUE) 300 MG/ML solution 75 mL (75 mLs Intravenous Contrast Given 10/07/19 2114)    Mobility walks Moderate fall risk    Focused Assessments    R Recommendations: See Admitting Provider Note  Report given to:   Additional Notes:

## 2019-10-09 LAB — CBC WITH DIFFERENTIAL/PLATELET
Abs Immature Granulocytes: 0.1 10*3/uL — ABNORMAL HIGH (ref 0.00–0.07)
Basophils Absolute: 0 10*3/uL (ref 0.0–0.1)
Basophils Relative: 0 %
Eosinophils Absolute: 0 10*3/uL (ref 0.0–0.5)
Eosinophils Relative: 0 %
HCT: 28.9 % — ABNORMAL LOW (ref 36.0–46.0)
Hemoglobin: 8.2 g/dL — ABNORMAL LOW (ref 12.0–15.0)
Immature Granulocytes: 1 %
Lymphocytes Relative: 4 %
Lymphs Abs: 0.4 10*3/uL — ABNORMAL LOW (ref 0.7–4.0)
MCH: 26.2 pg (ref 26.0–34.0)
MCHC: 28.4 g/dL — ABNORMAL LOW (ref 30.0–36.0)
MCV: 92.3 fL (ref 80.0–100.0)
Monocytes Absolute: 0.8 10*3/uL (ref 0.1–1.0)
Monocytes Relative: 8 %
Neutro Abs: 8.1 10*3/uL — ABNORMAL HIGH (ref 1.7–7.7)
Neutrophils Relative %: 87 %
Platelets: 334 10*3/uL (ref 150–400)
RBC: 3.13 MIL/uL — ABNORMAL LOW (ref 3.87–5.11)
RDW: 15.2 % (ref 11.5–15.5)
WBC: 9.4 10*3/uL (ref 4.0–10.5)
nRBC: 0 % (ref 0.0–0.2)

## 2019-10-09 LAB — MAGNESIUM: Magnesium: 1.7 mg/dL (ref 1.7–2.4)

## 2019-10-09 LAB — COMPREHENSIVE METABOLIC PANEL
ALT: 9 U/L (ref 0–44)
AST: 18 U/L (ref 15–41)
Albumin: 1.7 g/dL — ABNORMAL LOW (ref 3.5–5.0)
Alkaline Phosphatase: 58 U/L (ref 38–126)
Anion gap: 7 (ref 5–15)
BUN: 17 mg/dL (ref 8–23)
CO2: 31 mmol/L (ref 22–32)
Calcium: 7.6 mg/dL — ABNORMAL LOW (ref 8.9–10.3)
Chloride: 101 mmol/L (ref 98–111)
Creatinine, Ser: 0.93 mg/dL (ref 0.44–1.00)
GFR calc Af Amer: >60 mL/min (ref >60)
GFR calc non Af Amer: >60 mL/min (ref >60)
Glucose, Bld: 81 mg/dL (ref 70–99)
Potassium: 3.6 mmol/L (ref 3.5–5.1)
Sodium: 139 mmol/L (ref 135–145)
Total Bilirubin: 0.5 mg/dL (ref 0.3–1.2)
Total Protein: 6 g/dL — ABNORMAL LOW (ref 6.5–8.1)

## 2019-10-09 NOTE — Plan of Care (Signed)
  Problem: Acute Rehab PT Goals(only PT should resolve) Goal: Patient Will Transfer Sit To/From Stand Outcome: Progressing Flowsheets (Taken 10/09/2019 1519) Patient will transfer sit to/from stand:  with modified independence  with supervision Goal: Pt Will Transfer Bed To Chair/Chair To Bed Outcome: Progressing Flowsheets (Taken 10/09/2019 1519) Pt will Transfer Bed to Chair/Chair to Bed:  with modified independence  with supervision Goal: Pt Will Ambulate Outcome: Progressing Flowsheets (Taken 10/09/2019 1519) Pt will Ambulate:  50 feet  with supervision  with min guard assist  with rolling walker  with cane   3:19 PM, 10/09/19 Lonell Grandchild, MPT Physical Therapist with [facility] [phone] office [phone] mobile phone

## 2019-10-09 NOTE — Plan of Care (Signed)
  Problem: Clinical Measurements: Goal: Signs and symptoms of infection will decrease Outcome: Progressing   Problem: Respiratory: Goal: Ability to maintain adequate ventilation will improve Outcome: Progressing   Problem: Education: Goal: Knowledge of General Education information will improve Description: Including pain rating scale, medication(s)/side effects and non-pharmacologic comfort measures Outcome: Progressing   Problem: Health Behavior/Discharge Planning: Goal: Ability to manage health-related needs will improve Outcome: Progressing   Problem: Clinical Measurements: Goal: Ability to maintain clinical measurements within normal limits will improve Outcome: Progressing Goal: Will remain free from infection Outcome: Progressing Goal: Respiratory complications will improve Outcome: Progressing   Problem: Activity: Goal: Risk for activity intolerance will decrease Outcome: Progressing   Problem: Nutrition: Goal: Adequate nutrition will be maintained Outcome: Progressing   Problem: Elimination: Goal: Will not experience complications related to bowel motility Outcome: Progressing Goal: Will not experience complications related to urinary retention Outcome: Progressing   Problem: Pain Managment: Goal: General experience of comfort will improve Outcome: Progressing   Problem: Safety: Goal: Ability to remain free from injury will improve Outcome: Progressing   Problem: Skin Integrity: Goal: Risk for impaired skin integrity will decrease Outcome: Progressing

## 2019-10-09 NOTE — Evaluation (Signed)
Physical Therapy Evaluation Patient Details Name: Marilyn Solomon MRN: [id number] DOB: 1945/11/18 Today's Date: 10/09/2019   History of Present Illness  Marilyn Solomon is a 74 y.o. female with medical history significant of anemia, anxiety, osteoarthritis, chronic back pain, COPD, GERD, hypertension, hypothyroidism who was admitted from 501 2021 until 505 2021 due to COVID-19 pneumonia and now is coming to the emergency department due to having multiple episodes of emesis since the weekend associated with 3 loose stools BM daily since she was discharged.  The stools look dark, but no melena or hematochezia.  No dysuria, frequency or hematuria.  She has used Imodium without significant relief. She states that she feels very weak and her appetite is decreased. She mentions that she has been coughing greenish phlegm since she was discharged.  She has had a subjective fever, chills, but denies night sweats.  No rhinorrhea, sore throat, wheezing or hemoptysis.  Denies chest pain, palpitations, diaphoresis, PND, orthopnea or pitting edema of the lower extremities.  She denies polyuria, polydipsia, polyphagia or blurred vision.    Clinical Impression  Patient functioning near baseline for functional mobility and gait, has to lean on nearby objects for support requiring hand held assistance for safety, patient reluctant use RW and tolerated sitting up in chair after therapy -NT aware.  Patient will benefit from continued physical therapy in hospital and recommended venue below to increase strength, balance, endurance for safe ADLs and gait.     Follow Up Recommendations Home health PT;Supervision for mobility/OOB;Supervision - Intermittent    Equipment Recommendations  None recommended by PT    Recommendations for Other Services       Precautions / Restrictions Precautions Precautions: Fall Restrictions Weight Bearing Restrictions: No      Mobility  Bed Mobility Overal bed mobility:  Modified Independent             General bed mobility comments: HOB raised  Transfers Overall transfer level: Needs assistance Equipment used: 1 person hand held assist Transfers: Sit to/from Stand;Stand Pivot Transfers Sit to Stand: Min guard Stand pivot transfers: Min guard       General transfer comment: slightly unsteady having to lean on nearby objects for support  Ambulation/Gait Ambulation/Gait assistance: Min guard Gait Distance (Feet): 20 Feet Assistive device: 1 person hand held assist Gait Pattern/deviations: Decreased step length - right;Decreased step length - left;Decreased stride length Gait velocity: decreased   General Gait Details: slightly unsteady cadence with frequent leaning on nearby objects for support requiring hand held assistance for safety, limited secondary to fatigue, SpO2 at 89% after walking while on 2 LPM O2  Stairs            Wheelchair Mobility    Modified Rankin (Stroke Patients Only)       Balance Overall balance assessment: Needs assistance Sitting-balance support: Feet supported;No upper extremity supported Sitting balance-Leahy Scale: Good Sitting balance - Comments: seated at EOB   Standing balance support: During functional activity;No upper extremity supported Standing balance-Leahy Scale: Poor Standing balance comment: fair/poor without AD, fair/good with hand held assist                             Pertinent Vitals/Pain Pain Assessment: No/denies pain    Home Living Family/patient expects to be discharged to:: Private residence Living Arrangements: Children Available Help at Discharge: Family Type of Home: House Home Access: Stairs to enter Entrance Stairs-Rails: Right;Left;Can reach both Entrance Stairs-Number of Steps:  2-3 Home Layout: One level Home Equipment: Walker - 2 wheels;Bedside commode      Prior Function Level of Independence: Independent         Comments: household and  short distanced community ambulator with hand held assist or leaning on nearby objects for support     Hand Dominance        Extremity/Trunk Assessment   Upper Extremity Assessment Upper Extremity Assessment: Overall WFL for tasks assessed    Lower Extremity Assessment Lower Extremity Assessment: Generalized weakness    Cervical / Trunk Assessment Cervical / Trunk Assessment: Kyphotic  Communication      Cognition Arousal/Alertness: Awake/alert Behavior During Therapy: WFL for tasks assessed/performed Overall Cognitive Status: Within Functional Limits for tasks assessed                                        General Comments      Exercises     Assessment/Plan    PT Assessment Patient needs continued PT services  PT Problem List Decreased strength;Decreased activity tolerance;Decreased balance;Decreased mobility       PT Treatment Interventions Balance training;Gait training;Stair training;Functional mobility training;Therapeutic activities;Therapeutic exercise;Patient/family education    PT Goals (Current goals can be found in the Care Plan section)  Acute Rehab PT Goals Patient Stated Goal: return home with family to assist PT Goal Formulation: With patient Time For Goal Achievement: 10/16/19 Potential to Achieve Goals: Good    Frequency Min 3X/week   Barriers to discharge        Co-evaluation               AM-PAC PT "6 Clicks" Mobility  Outcome Measure Help needed turning from your back to your side while in a flat bed without using bedrails?: None Help needed moving from lying on your back to sitting on the side of a flat bed without using bedrails?: None Help needed moving to and from a bed to a chair (including a wheelchair)?: A Little Help needed standing up from a chair using your arms (e.g., wheelchair or bedside chair)?: A Little Help needed to walk in hospital room?: A Little Help needed climbing 3-5 steps with a railing?  : A Little 6 Click Score: 20    End of Session Equipment Utilized During Treatment: Oxygen Activity Tolerance: Patient tolerated treatment well;Patient limited by fatigue Patient left: in chair;with call bell/phone within reach Nurse Communication: Mobility status PT Visit Diagnosis: Unsteadiness on feet (R26.81);Other abnormalities of gait and mobility (R26.89);Muscle weakness (generalized) (M62.81)    Time: 5621-3086 PT Time Calculation (min) (ACUTE ONLY): 28 min   Charges:   PT Evaluation $PT Eval Moderate Complexity: 1 Mod PT Treatments $Therapeutic Activity: 23-37 mins        3:17 PM, 10/09/19 Lonell Grandchild, MPT Physical Therapist with [facility] 336 [phone] office [phone] mobile phone

## 2019-10-09 NOTE — Consult Note (Signed)
Referring Provider: Murlean Iba, MD Primary Care Physician:  Lucianne Lei, MD Primary Gastroenterologist:    Reason for Consultation:  Black heme + stool, Cdiff infection  HPI: Marilyn Solomon is a 74 y.o. female with past medical history of anemia (required transfusion in 2019), anxiety, osteoarthritis, chronic back pain, COPD, GERD, hypertension, hypothyroidism who was admitted May 1 through May 5 due to COVID-19 pneumonia now presenting back after multiple episodes of emesis since the weekend associated with 3 loose stools daily since discharge.  Stools have been dark.  On presentation her white blood cell count was 39,000 with 92% neutrophils, hemoglobin 10.6, INR 1.1, lactic acid normal.  BUN 36, creatinine 1.14.  Chest x-ray showed COPD with patchy bilateral airspace disease slightly worse than previous study.  CT abdomen pelvis with contrast showed diverticulosis without diverticulitis and changes at the bases of the lungs compatible with COVID-19 pneumonia.  Of note patient was treated with dexamethasone and remdesivir infusion.Stool studies positive for C. difficile.  Heme positive stool.  She is on oral vancomycin.  Today patient states her diarrhea has improved since admission.  She denies any abdominal pain.  Her appetite is fair.  No nausea or vomiting.  No heartburn.  She denies black stools.  No blood in the stool.  Spoke to patient's nurse today who denies any report of melena.  Stools have been dark.  T-max of 100.7.  Patient is on oral vancomycin 125 mg 4 times daily.  On pantoprazole 40 mg daily.  Review of epic shows chronic anemia with hemoglobin in the 9-10 range.  When she presented on May 1 with Covid 19 pneumonia, her hemoglobin was 9.4.  On May 5 her hemoglobin was 9.5.  When she presented back this admission on May 11 her hemoglobin was 10.6.  Hemoglobin dropped down to 7.6 yesterday and today is 8.2.  Patient was seen in early 2019 when she was hospitalized with  shortness of breath and anemia.  Procedures were postponed due to respiratory status at the time.  She then completed a Cologuard under the direction of her PCP which was positive.  Ultimately she completed colonoscopy in May 2019 which was unremarkable except for diverticulosis.  EGD at the same time showed nonobstructing Schatzki ring, mild erosive reflux esophagitis, small hiatal hernia, gastritis but no H. pylori.  Patient was advised to undergo capsule endoscopy but she declined.   Prior to Admission medications   Medication Sig Start Date End Date Taking? Authorizing Provider  albuterol (VENTOLIN HFA) 108 (90 Base) MCG/ACT inhaler Inhale 1-2 puffs into the lungs every 6 (six) hours as needed for wheezing or shortness of breath.   Yes [provider]  ALPRAZolam Duanne Moron) 1 MG tablet Take 1 mg by mouth every 8 (eight) hours as needed for anxiety.  08/10/14  Yes [provider]  ascorbic acid (VITAMIN C) 500 MG tablet Take 1 tablet (500 mg total) by mouth daily for 10 days. 10/02/19 10/12/19 Yes Shah, Pratik D, DO  dextromethorphan (DELSYM) 30 MG/5ML liquid Take 2.5 mLs (15 mg total) by mouth every 6 (six) hours as needed for cough. 10/01/19  Yes Shah, Pratik D, DO  Fluticasone-Umeclidin-Vilant (TRELEGY ELLIPTA) 100-62.5-25 MCG/INH AEPB Inhale 1 puff into the lungs daily.  12/25/18  Yes Sinda Du, MD  levothyroxine (SYNTHROID) 50 MCG tablet Take 50 mcg by mouth daily.  09/11/18  Yes [provider]  oxyCODONE (ROXICODONE) 15 MG immediate release tablet Take 15 mg by mouth 3 (three) times daily  as needed for pain.    Yes [provider]  pantoprazole (PROTONIX) 40 MG tablet TAKE 1 TABLET BY MOUTH DAILY. Patient taking differently: Take 40 mg by mouth daily as needed (for acid reflux).  05/27/18  Yes Annitta Needs, NP  Vitamin D, Ergocalciferol, (DRISDOL) 1.25 MG (50000 UNIT) CAPS capsule Take 50,000 Units by mouth every 7 (seven) days.   Yes [provider]   dexamethasone (DECADRON) 4 MG tablet Take 6 mg by mouth daily. 3 day course starting on 10/01/2019 10/01/19   [provider]  ipratropium-albuterol (DUONEB) 0.5-2.5 (3) MG/3ML SOLN Take 3 mLs by nebulization every 6 (six) hours as needed (shortness of breath or wheezing). 06/04/18 11/07/18  Manuella Ghazi, Pratik D, DO  zinc sulfate 220 (50 Zn) MG capsule Take 1 capsule (220 mg total) by mouth daily for 10 days. Patient not taking: Reported on 10/07/2019 10/02/19 10/12/19  Heath Lark D, DO    Current Facility-Administered Medications  Medication Dose Route Frequency Provider Last Rate Last Admin  . 0.9 %  sodium chloride infusion   Intravenous Continuous Wynetta Emery, Clanford L, MD 50 mL/hr at 10/09/19 0609 Rate Verify at 10/09/19 0609  . acetaminophen (TYLENOL) tablet 650 mg  650 mg Oral Q6H PRN Reubin Milan, MD   650 mg at 10/08/19 2153   Or  . acetaminophen (TYLENOL) suppository 650 mg  650 mg Rectal Q6H PRN Reubin Milan, MD      . ALPRAZolam Duanne Moron) tablet 0.5 mg  0.5 mg Oral Q8H PRN Johnson, Clanford L, MD      . ascorbic acid (VITAMIN C) tablet 500 mg  500 mg Oral Daily Reubin Milan, MD   500 mg at 10/08/19 1715  . dextromethorphan (DELSYM) 30 MG/5ML liquid 15 mg  15 mg Oral BID PRN Reubin Milan, MD   15 mg at 10/08/19 2153  . fluticasone furoate-vilanterol (BREO ELLIPTA) 100-25 MCG/INH 1 puff  1 puff Inhalation Daily Reubin Milan, MD   1 puff at 10/09/19 0910   And  . umeclidinium bromide (INCRUSE ELLIPTA) 62.5 MCG/INH 1 puff  1 puff Inhalation Daily Reubin Milan, MD   1 puff at 10/09/19 0910  . ipratropium-albuterol (DUONEB) 0.5-2.5 (3) MG/3ML nebulizer solution 3 mL  3 mL Nebulization Q6H PRN Reubin Milan, MD      . levothyroxine (SYNTHROID) tablet 50 mcg  50 mcg Oral Daily Reubin Milan, MD   50 mcg at 10/09/19 0544  . ondansetron (ZOFRAN) tablet 4 mg  4 mg Oral Q6H PRN Reubin Milan, MD       Or  . ondansetron [facility]) injection 4 mg   4 mg Intravenous Q6H PRN Reubin Milan, MD      . oxyCODONE (Oxy IR/ROXICODONE) immediate release tablet 5 mg  5 mg Oral Q4H PRN Reubin Milan, MD      . pantoprazole (PROTONIX) EC tablet 40 mg  40 mg Oral Daily Reubin Milan, MD   40 mg at 10/08/19 1715  . vancomycin (VANCOCIN) 50 mg/mL oral solution 125 mg  125 mg Oral QID Wynetta Emery, Clanford L, MD   125 mg at 10/08/19 2217  . [START ON 10/13/2019] Vitamin D (Ergocalciferol) (DRISDOL) capsule 50,000 Units  50,000 Units Oral Q7 days Murlean Iba, MD        Allergies as of 10/07/2019 - Review Complete 10/07/2019  Allergen Reaction Noted  . Penicillins Itching 12/05/2013  . Vicodin [hydrocodone-acetaminophen] Nausea And Vomiting 04/17/2011  Past Medical History:  Diagnosis Date  . Anemia   . Anxiety   . Arthritis   . Chronic back pain   . COPD (chronic obstructive pulmonary disease) ([place])   . GERD (gastroesophageal reflux disease)   . Hypertension   . Hypothyroidism   . Reflux    no medications currently, asymptomatic  . Thyroid disease     Past Surgical History:  Procedure Laterality Date  . ABDOMINAL HYSTERECTOMY    . BIOPSY  10/08/2017   Procedure: BIOPSY;  Surgeon: Daneil Dolin, MD;  Location: [facility];  Service: Endoscopy;;  gastric  . cataracts Bilateral   . COLONOSCOPY  2007   Dr. Gala Romney: normal rectum, diverticula   . COLONOSCOPY WITH PROPOFOL N/A 11/15/2015   Dr. Gala Romney: grade II hemorrhoids, diverticulosis  . COLONOSCOPY WITH PROPOFOL N/A 10/08/2017   Procedure: COLONOSCOPY WITH PROPOFOL;  Surgeon: Daneil Dolin, MD;  Location: [facility];  Service: Endoscopy;  Laterality: N/A;  11:15am  . ESOPHAGOGASTRODUODENOSCOPY (EGD) WITH PROPOFOL N/A 10/08/2017   Procedure: ESOPHAGOGASTRODUODENOSCOPY (EGD) WITH PROPOFOL;  Surgeon: Daneil Dolin, MD;  Location: [facility];  Service: Endoscopy;  Laterality: N/A;  . FRACTURE SURGERY Right 2005   Wrist  . JOINT REPLACEMENT Left 2000   hip  .  TOTAL HIP ARTHROPLASTY Left   . WRIST SURGERY Right     Family History  Problem Relation Age of Onset  . Bone cancer Sister   . Pancreatic cancer Sister   . Pneumonia Mother   . Heart attack Father   . Diabetes Sister   . Dementia Sister   . Colon cancer Neg Hx     Social History   Socioeconomic History  . Marital status: Widowed    Spouse name: Not on file  . Number of children: Not on file  . Years of education: Not on file  . Highest education level: Not on file  Occupational History  . Occupation: disability  Tobacco Use  . Smoking status: Former Smoker    Packs/day: 25.00    Years: 1.00    Pack years: 25.00    Types: Cigarettes    Quit date: 08/27/1998    Years since quitting: 21.1  . Smokeless tobacco: Never Used  . Tobacco comment: quit in 2000  Substance and Sexual Activity  . Alcohol use: No    Alcohol/week: 0.0 standard drinks  . Drug use: No  . Sexual activity: Yes    Birth control/protection: Surgical  Other Topics Concern  . Not on file  Social History Narrative  . Not on file   Social Determinants of Health   Financial Resource Strain:   . Difficulty of Paying Living Expenses:   Food Insecurity:   . Worried About Charity fundraiser in the Last Year:   . Arboriculturist in the Last Year:   Transportation Needs: No Transportation Needs  . Lack of Transportation (Medical): No  . Lack of Transportation (Non-Medical): No  Physical Activity:   . Days of Exercise per Week:   . Minutes of Exercise per Session:   Stress:   . Feeling of Stress :   Social Connections:   . Frequency of Communication with Friends and Family:   . Frequency of Social Gatherings with Friends and Family:   . Attends Religious Services:   . Active Member of Clubs or Organizations:   . Attends Archivist Meetings:   Marland Kitchen Marital Status:   Intimate Partner Violence:   .  Fear of Current or Ex-Partner:   . Emotionally Abused:   Marland Kitchen Physically Abused:   . Sexually  Abused:      ROS:  General: Negative for fever, chills, fatigue, +weakness. Eyes: Negative for vision changes.  ENT: Negative for hoarseness, difficulty swallowing , nasal congestion. CV: Negative for chest pain, angina, palpitations, dyspnea on exertion, peripheral edema.  Respiratory: Negative for dyspnea at rest, dyspnea on exertion, +cough,+ sputum, wheezing.  GI: See history of present illness. GU:  Negative for dysuria, hematuria, urinary incontinence, urinary frequency, nocturnal urination.  MS: Negative for joint pain, low back pain.  Derm: Negative for rash or itching.  Neuro: Negative for weakness, abnormal sensation, seizure, frequent headaches, memory loss, confusion.  Psych: Negative for anxiety, depression, suicidal ideation, hallucinations.  Endo: Negative for unusual weight change.  Heme: Negative for bruising or bleeding. Allergy: Negative for rash or hives.       Physical Examination: Vital signs in last 24 hours: Temp:  [98.7 F (37.1 C)-100.7 F (38.2 C)] 98.7 F (37.1 C) (05/13 0418) Pulse Rate:  [85-110] 92 (05/13 0418) Resp:  [16-25] 19 (05/13 0418) BP: (101-152)/(45-101) 109/61 (05/13 0418) SpO2:  [86 %-100 %] 100 % (05/13 0418) Weight:  [52.6 kg] 52.6 kg (05/12 2016) Last BM Date: 10/08/19  General: chronically ill appearing thin female in NAD.  Head: Normocephalic, atraumatic.   Eyes: Conjunctiva pink, no icterus. Mouth: Oropharyngeal mucosa moist and pink , no lesions erythema or exudate. Abdomen: Bowel sounds are normal, nontender, nondistended, no hepatosplenomegaly or masses, no abdominal bruits or    hernia , no rebound or guarding.   Rectal: not performed Extremities: No lower extremity edema, clubbing, deformity.  Neuro: Alert and oriented x 4 , grossly normal neurologically.  Skin: Warm and dry, no rash or jaundice.   Psych: Alert and cooperative, normal mood and affect.        Intake/Output from previous day: 05/12 0701 - 05/13  0700 In: 2055.5 [P.O.:400; I.V.:1655.5] Out: -  Intake/Output this shift: No intake/output data recorded.  Lab Results: CBC Recent Labs    10/07/19 1839 10/08/19 0327 10/09/19 0557  WBC 39.0* 16.0* 9.4  HGB 10.6* 7.6* 8.2*  HCT 36.3 26.2* 28.9*  MCV 89.9 90.0 92.3  PLT 494* 278 334   BMET Recent Labs    10/07/19 1839 10/08/19 0327 10/09/19 0557  NA 137 139 139  K 4.5 3.9 3.6  CL 94* 100 101  CO2 31 32 31  GLUCOSE 145* 87 81  BUN 36* 28* 17  CREATININE 1.14* 0.85 0.93  CALCIUM 8.3* 7.4* 7.6*   LFT Recent Labs    10/07/19 1839 10/08/19 0327 10/09/19 0557  BILITOT 0.8 0.5 0.5  BILIDIR 0.2  --   --   IBILI 0.6  --   --   ALKPHOS 90 60 58  AST 17 13* 18  ALT 12 9 9   PROT 7.8 5.5* 6.0*  ALBUMIN 2.3* 1.6* 1.7*    Lipase No results for input(s): LIPASE in the last 72 hours.  PT/INR Recent Labs    10/07/19 1839  LABPROT 13.9  INR 1.1     Lab Results  Component Value Date   FERRITIN 217 10/01/2019        Imaging Studies: DG Chest 2 View  Result Date: 09/27/2019 CLINICAL DATA:  Cough. EXAM: CHEST - 2 VIEW COMPARISON:  November 06, 2018 FINDINGS: The lungs are hyperinflated. Moderate severity diffusely increased interstitial lung markings are seen. This is similar in appearance when  compared to the prior study. Mild, predominant stable right apical scarring and/or atelectasis is seen. Mild areas of atelectasis and/or infiltrate are seen along the periphery of the bilateral lung bases. This represents a new finding when compared to the prior study. There is no evidence of a pleural effusion or pneumothorax. The heart size and mediastinal contours are within normal limits. There is moderate severity calcification of the aortic arch. Degenerative changes seen throughout the thoracic spine. IMPRESSION: 1. Mild bibasilar atelectasis and/or infiltrate, new when compared to the prior study. 2. Chronic interstitial lung disease with stable right apical scarring and/or  atelectasis. Electronically Signed   By: Virgina Norfolk M.D.   On: 09/27/2019 21:11   CT ABDOMEN PELVIS W CONTRAST  Result Date: 10/07/2019 CLINICAL DATA:  Abdominal pain and elevated white blood cell count, history of COVID-19 positivity EXAM: CT ABDOMEN AND PELVIS WITH CONTRAST TECHNIQUE: Multidetector CT imaging of the abdomen and pelvis was performed using the standard protocol following bolus administration of intravenous contrast. CONTRAST:  90mL OMNIPAQUE IOHEXOL 300 MG/ML  SOLN COMPARISON:  Chest x-ray from earlier in the same day. FINDINGS: Lower chest: Lung bases show patchy opacities with a somewhat nodular component consistent with the patient's given clinical history of COVID-19 positivity. No sizable effusion is seen. Hepatobiliary: No focal liver abnormality is seen. No gallstones, gallbladder wall thickening, or biliary dilatation. Pancreas: Unremarkable. No pancreatic ductal dilatation or surrounding inflammatory changes. Spleen: Normal in size without focal abnormality. Adrenals/Urinary Tract: Adrenal glands are within normal limits. Kidneys demonstrate a normal enhancement pattern. No renal calculi or obstructive changes are noted. Small 1 cm cyst is noted within the left kidney. No ureteral stones are noted. The bladder is partially distended. Stomach/Bowel: Scattered diverticular change of the colon is noted without definitive diverticulitis. The appendix is not well visualized although no inflammatory changes are seen to suggest appendicitis. Small bowel shows no obstructive change. No gastric abnormality is seen. Vascular/Lymphatic: Aortic atherosclerosis. No enlarged abdominal or pelvic lymph nodes. Reproductive: Status post hysterectomy. No adnexal masses. Other: No abdominal wall hernia or abnormality. No abdominopelvic ascites. Musculoskeletal: Left hip replacement is noted. No acute bony abnormality is seen. IMPRESSION: Diverticulosis without evidence of diverticulitis. Changes in  the lung bases consistent with the given clinical history of COVID-19 positivity. No other focal abnormality is seen. Electronically Signed   By: Inez Catalina M.D.   On: 10/07/2019 21:51   DG Chest Port 1 View  Result Date: 10/07/2019 CLINICAL DATA:  COVID, shortness of breath EXAM: PORTABLE CHEST 1 VIEW COMPARISON:  09/27/2019 FINDINGS: Lungs remain hyperinflated. Patchy bilateral airspace opacities and interstitial prominence throughout the lungs. Overall, patchy opacities slightly increased since prior study. Heart is normal size. No effusions or pneumothorax. No acute bony abnormality. IMPRESSION: COPD. Patchy bilateral airspace disease slightly worsening since prior study Electronically Signed   By: Rolm Baptise M.D.   On: 10/07/2019 19:23  [4 week]   Impression: Pleasant 74 year old female with multiple comorbidities as outlined above, recent admission for COVID-19 pneumonia presenting back to the hospital with nausea, vomiting, diarrhea.  Tested positive for C. difficile.  CT as outlined without any significant colon findings.  Patient is on oral vancomycin and clinically doing better.  Leukocytosis has resolved.  She has chronic anemia as outlined, patient did not complete work-up as recommended.  Her hemoglobin typically has been in the 9-10 range and overall has been stable considering recent COVID-19 pneumonia and now C. difficile.  Heme positive stools in the setting is  not unusual.  Given no report of black tarry stools, would recommend monitoring for now.    Plan: 1. Complete treatment for C. difficile diarrhea. 2. Monitor hemoglobin closely, monitor for black tarry stools or bloody stools.  Would hold off on any endoscopic evaluation unless she had transfusion dependent anemia or overt GI bleeding.  Could consider further work-up as an outpatient once acute illness is resolved.  We would like to thank you for the opportunity to participate in the care of KEYLEN UZELAC.  Laureen Ochs.  Bernarda Caffey [facility] [phone] 5/13/20213:02 PM     LOS: 2 days

## 2019-10-09 NOTE — Progress Notes (Addendum)
PROGRESS NOTE   Marilyn Solomon  JOI:[id number] DOB: [birth year] DOA: 10/07/2019 PCP: Lucianne Lei, MD   Chief Complaint  Patient presents with   Emesis    Brief Narrative:  74 y.o. female with medical history significant of anemia, anxiety, osteoarthritis, chronic back pain, COPD, GERD, hypertension, hypothyroidism who was admitted from 09/27/2019 until 10/01/19 due to COVID-19 pneumonia and now is coming to the emergency department due to having multiple episodes of emesis since the weekend associated with 3 loose stools BM daily since she was discharged.    Assessment & Plan:   Principal Problem:   Sepsis due to undetermined organism [facility]) Active Problems:   Hypothyroidism   GERD (gastroesophageal reflux disease)   Normocytic anemia   Leukocytosis   COPD exacerbation RULED OUT   Hypertension   Hyperglycemia   Acute gastroenteritis   Acute respiratory distress   C. difficile diarrhea   Heme positive stool  C diff diarrhea - Pt started on oral vancomycin for moderate to nonsevere disease with no sepsis presentation.  Continue precautions as ordered.  Pt will need to treat for full 10 days.  Improving.  Covid pneumonia - Pt was fully treated during recent hospitalization, continue isolation.   Leukocytosis - secondary to C diff infection, follow CBC/diff.  Anemia with heme positive stool - Appreciate GI consult, will monitor for now, outpatient work up.    Hypertension - stable.  Generalized weakness - multifactorial, will get PT eval when more medically stable.  GERD - protonix for GI protection.  Hypothyroidism - stable.  Chronic hypoxic respiratory failure COPD Exacerbation RULED OUT  DVT prophylaxis: SCD Code Status:  Full  Family Communication:  Disposition:   Status is: Inpatient  Remains inpatient appropriate because:IV treatments appropriate due to intensity of illness or inability to take PO  Dispo: The patient is from: Home              Anticipated d/c is to:  Home              Anticipated d/c date is: 1-2 days              Patient currently is not medically stable to d/c.   Consultants:  GI   Procedures:    Antimicrobials: vancomycin oral 5/12>>   Subjective: Pt reports that diarrhea is much better today.   Objective: Vitals:   10/09/19 0003 10/09/19 0418 10/09/19 0800 10/09/19 1327  BP: (!) 104/54 109/61 125/62 (!) 145/69  Pulse: 95 92 (!) 103 97  Resp: 19 19 20 20   Temp: 99.1 F (37.3 C) 98.7 F (37.1 C) 99.2 F (37.3 C) 98.8 F (37.1 C)  TempSrc: Oral Oral Oral Oral  SpO2: 98% 100% 99% 99%  Weight:      Height:        Intake/Output Summary (Last 24 hours) at 10/09/2019 1649 Last data filed at 10/09/2019 4709 Gross per 24 hour  Intake 1571.5 ml  Output --  Net 1571.5 ml   Filed Weights   10/07/19 1802 10/08/19 2016  Weight: 51.7 kg 52.6 kg    Examination:  General exam: Appears calm and comfortable, pale emaciated, chronically ill appearing female.  Respiratory system: Clear to auscultation. Respiratory effort normal. Cardiovascular system: S1 & S2 heard, RRR. No JVD, murmurs, rubs, gallops or clicks. No pedal edema. Gastrointestinal system: Abdomen is nondistended, soft and nontender. No organomegaly or masses felt. Normal bowel sounds heard. Central nervous system: Alert and oriented. No focal neurological deficits. Extremities: Symmetric 5  x 5 power. Skin: No rashes, lesions or ulcers Psychiatry: Judgement and insight appear normal. Mood & affect appropriate.   Data Reviewed: I have personally reviewed following labs and imaging studies  CBC: Recent Labs  Lab 10/07/19 1839 10/08/19 0327 10/09/19 0557  WBC 39.0* 16.0* 9.4  NEUTROABS 35.8* 14.3* 8.1*  HGB 10.6* 7.6* 8.2*  HCT 36.3 26.2* 28.9*  MCV 89.9 90.0 92.3  PLT 494* 278 427    Basic Metabolic Panel: Recent Labs  Lab 10/07/19 1839 10/08/19 0327 10/09/19 0557  NA 137 139 139  K 4.5 3.9 3.6  CL 94* 100 101  CO2 31 32 31  GLUCOSE 145* 87  81  BUN 36* 28* 17  CREATININE 1.14* 0.85 0.93  CALCIUM 8.3* 7.4* 7.6*  MG  --   --  1.7    GFR: Estimated Creatinine Clearance: 44.7 mL/min (by C-G formula based on SCr of 0.93 mg/dL).  Liver Function Tests: Recent Labs  Lab 10/07/19 1839 10/08/19 0327 10/09/19 0557  AST 17 13* 18  ALT 12 9 9   ALKPHOS 90 60 58  BILITOT 0.8 0.5 0.5  PROT 7.8 5.5* 6.0*  ALBUMIN 2.3* 1.6* 1.7*    CBG: No results for input(s): GLUCAP in the last 168 hours.   Recent Results (from the past 240 hour(s))  Blood Culture (routine x 2)     Status: None (Preliminary result)   Collection Time: 10/07/19  7:54 PM   Specimen: BLOOD RIGHT ARM  Result Value Ref Range Status   Specimen Description BLOOD RIGHT ARM  Final   Special Requests   Final    BOTTLES DRAWN AEROBIC AND ANAEROBIC Blood Culture adequate volume   Culture   Final    NO GROWTH < 12 HOURS Performed at [facility], [address], [place], [place] [address]    Report Status PENDING  Incomplete  Blood Culture (routine x 2)     Status: None (Preliminary result)   Collection Time: 10/07/19  8:07 PM   Specimen: BLOOD RIGHT HAND  Result Value Ref Range Status   Specimen Description BLOOD RIGHT HAND  Final   Special Requests   Final    BOTTLES DRAWN AEROBIC AND ANAEROBIC Blood Culture adequate volume   Culture   Final    NO GROWTH < 12 HOURS Performed at [facility], [address], [place], [place] [address]    Report Status PENDING  Incomplete  C Difficile Quick Screen w PCR reflex     Status: Abnormal   Collection Time: 10/08/19  7:42 AM   Specimen: STOOL  Result Value Ref Range Status   C Diff antigen POSITIVE (A) NEGATIVE Final   C Diff toxin NEGATIVE NEGATIVE Final   C Diff interpretation Results are indeterminate. See PCR results.  Final    Comment: Performed at [facility], [address], [place], [place] [address]  C. Diff by PCR, Reflexed     Status: Abnormal   Collection Time: 10/08/19  7:42 AM  Result Value Ref  Range Status   Toxigenic C. Difficile by PCR POSITIVE (A) NEGATIVE Final    Comment: Positive for toxigenic C. difficile with little to no toxin production. Only treat if clinical presentation suggests symptomatic illness. Performed at [facility], [place] [address] [place],  [address]    Radiology Studies: CT ABDOMEN PELVIS W CONTRAST  Result Date: 10/07/2019 CLINICAL DATA:  Abdominal pain and elevated white blood cell count, history of COVID-19 positivity EXAM: CT ABDOMEN AND PELVIS  WITH CONTRAST TECHNIQUE: Multidetector CT imaging of the abdomen and pelvis was performed using the standard protocol following bolus administration of intravenous contrast. CONTRAST:  67mL OMNIPAQUE IOHEXOL 300 MG/ML  SOLN COMPARISON:  Chest x-ray from earlier in the same day. FINDINGS: Lower chest: Lung bases show patchy opacities with a somewhat nodular component consistent with the patient's given clinical history of COVID-19 positivity. No sizable effusion is seen. Hepatobiliary: No focal liver abnormality is seen. No gallstones, gallbladder wall thickening, or biliary dilatation. Pancreas: Unremarkable. No pancreatic ductal dilatation or surrounding inflammatory changes. Spleen: Normal in size without focal abnormality. Adrenals/Urinary Tract: Adrenal glands are within normal limits. Kidneys demonstrate a normal enhancement pattern. No renal calculi or obstructive changes are noted. Small 1 cm cyst is noted within the left kidney. No ureteral stones are noted. The bladder is partially distended. Stomach/Bowel: Scattered diverticular change of the colon is noted without definitive diverticulitis. The appendix is not well visualized although no inflammatory changes are seen to suggest appendicitis. Small bowel shows no obstructive change. No gastric abnormality is seen. Vascular/Lymphatic: Aortic atherosclerosis. No enlarged abdominal or pelvic lymph nodes. Reproductive: Status post hysterectomy. No adnexal  masses. Other: No abdominal wall hernia or abnormality. No abdominopelvic ascites. Musculoskeletal: Left hip replacement is noted. No acute bony abnormality is seen. IMPRESSION: Diverticulosis without evidence of diverticulitis. Changes in the lung bases consistent with the given clinical history of COVID-19 positivity. No other focal abnormality is seen. Electronically Signed   By: Inez Catalina M.D.   On: 10/07/2019 21:51   DG Chest Port 1 View  Result Date: 10/07/2019 CLINICAL DATA:  COVID, shortness of breath EXAM: PORTABLE CHEST 1 VIEW COMPARISON:  09/27/2019 FINDINGS: Lungs remain hyperinflated. Patchy bilateral airspace opacities and interstitial prominence throughout the lungs. Overall, patchy opacities slightly increased since prior study. Heart is normal size. No effusions or pneumothorax. No acute bony abnormality. IMPRESSION: COPD. Patchy bilateral airspace disease slightly worsening since prior study Electronically Signed   By: Rolm Baptise M.D.   On: 10/07/2019 19:23   Scheduled Meds:  ascorbic acid  500 mg Oral Daily   fluticasone furoate-vilanterol  1 puff Inhalation Daily   And   umeclidinium bromide  1 puff Inhalation Daily   levothyroxine  50 mcg Oral Daily   pantoprazole  40 mg Oral Daily   vancomycin  125 mg Oral QID   [START ON 10/13/2019] Vitamin D (Ergocalciferol)  50,000 Units Oral Q7 days   Continuous Infusions:  sodium chloride 50 mL/hr at 10/09/19 0609     LOS: 2 days   Time spent: 25 mins   Sherald Balbuena Wynetta Emery, MD [facility]   To contact the attending provider between 7A-7P or the covering provider during after hours 7P-7A, please log into the web site [email or web address] and access using universal [place] password for that web site. If you do not have the password, please call the hospital operator.  10/09/2019, 4:49 PM

## 2019-10-10 ENCOUNTER — Telehealth: Payer: Self-pay | Admitting: Nurse Practitioner

## 2019-10-10 DIAGNOSIS — R739 Hyperglycemia, unspecified: Secondary | ICD-10-CM

## 2019-10-10 DIAGNOSIS — A0472 Enterocolitis due to Clostridium difficile, not specified as recurrent: Principal | ICD-10-CM

## 2019-10-10 DIAGNOSIS — R195 Other fecal abnormalities: Secondary | ICD-10-CM

## 2019-10-10 LAB — COMPREHENSIVE METABOLIC PANEL
ALT: 9 U/L (ref 0–44)
AST: 16 U/L (ref 15–41)
Albumin: 1.6 g/dL — ABNORMAL LOW (ref 3.5–5.0)
Alkaline Phosphatase: 48 U/L (ref 38–126)
Anion gap: 6 (ref 5–15)
BUN: 11 mg/dL (ref 8–23)
CO2: 29 mmol/L (ref 22–32)
Calcium: 7.3 mg/dL — ABNORMAL LOW (ref 8.9–10.3)
Chloride: 101 mmol/L (ref 98–111)
Creatinine, Ser: 0.79 mg/dL (ref 0.44–1.00)
GFR calc Af Amer: >60 mL/min (ref >60)
GFR calc non Af Amer: >60 mL/min (ref >60)
Glucose, Bld: 95 mg/dL (ref 70–99)
Potassium: 3.3 mmol/L — ABNORMAL LOW (ref 3.5–5.1)
Sodium: 136 mmol/L (ref 135–145)
Total Bilirubin: 0.5 mg/dL (ref 0.3–1.2)
Total Protein: 5.6 g/dL — ABNORMAL LOW (ref 6.5–8.1)

## 2019-10-10 LAB — CBC WITH DIFFERENTIAL/PLATELET
Abs Immature Granulocytes: 0.06 10*3/uL (ref 0.00–0.07)
Basophils Absolute: 0 10*3/uL (ref 0.0–0.1)
Basophils Relative: 0 %
Eosinophils Absolute: 0 10*3/uL (ref 0.0–0.5)
Eosinophils Relative: 0 %
HCT: 25 % — ABNORMAL LOW (ref 36.0–46.0)
Hemoglobin: 7.2 g/dL — ABNORMAL LOW (ref 12.0–15.0)
Immature Granulocytes: 1 %
Lymphocytes Relative: 8 %
Lymphs Abs: 0.5 10*3/uL — ABNORMAL LOW (ref 0.7–4.0)
MCH: 26 pg (ref 26.0–34.0)
MCHC: 28.8 g/dL — ABNORMAL LOW (ref 30.0–36.0)
MCV: 90.3 fL (ref 80.0–100.0)
Monocytes Absolute: 0.7 10*3/uL (ref 0.1–1.0)
Monocytes Relative: 10 %
Neutro Abs: 5.5 10*3/uL (ref 1.7–7.7)
Neutrophils Relative %: 81 %
Platelets: 342 10*3/uL (ref 150–400)
RBC: 2.77 MIL/uL — ABNORMAL LOW (ref 3.87–5.11)
RDW: 14.9 % (ref 11.5–15.5)
WBC: 6.8 10*3/uL (ref 4.0–10.5)
nRBC: 0 % (ref 0.0–0.2)

## 2019-10-10 LAB — STREP PNEUMONIAE URINARY ANTIGEN: Strep Pneumo Urinary Antigen: NEGATIVE

## 2019-10-10 MED ORDER — VANCOMYCIN HCL 125 MG PO CAPS: 125.0000 mg | ORAL_CAPSULE | Freq: Four times a day (QID) | ORAL | 0 refills | Status: AC

## 2019-10-10 MED ORDER — MAGNESIUM SULFATE 2 GM/50ML IV SOLN
2.0000 g | Freq: Once | INTRAVENOUS | Status: AC
  Administered 2019-10-10: 2 g via INTRAVENOUS
  Filled 2019-10-10: qty 50

## 2019-10-10 MED ORDER — POTASSIUM CHLORIDE CRYS ER 20 MEQ PO TBCR
40.0000 meq | EXTENDED_RELEASE_TABLET | Freq: Once | ORAL | Status: AC
  Administered 2019-10-10: 40 meq via ORAL
  Filled 2019-10-10: qty 2

## 2019-10-10 NOTE — Progress Notes (Addendum)
Reviewed the chart and discussed the case with hospitalist Dr. Wynetta Emery and plans are to d/c today. Patient clinically improved. Plan d/c home on vancomycin completion for C. Diff. Will schedule office follow-up. Call our office if any worsening or significant bleeding.  Thank you for allowing [place] to participate in the care of Marilyn Solomon  Akshita Italiano, DNP, AGNP-C Adult & Gerontological Nurse Practitioner [facility]

## 2019-10-10 NOTE — Care Management Important Message (Signed)
Important Message  Patient Details  Name: Marilyn Solomon MRN: [id number] Date of Birth: [birth year]   Medicare Important Message Given:  Yes     Tommy Medal 10/10/2019, 3:40 PM

## 2019-10-10 NOTE — Discharge Summary (Signed)
Physician Discharge Summary  Marilyn Solomon SWF:[id number] DOB: [birth year] DOA: 10/07/2019  PCP: Lucianne Lei, MD  Admit date: 10/07/2019 Discharge date: 10/10/2019  Admitted From:  HOME  Disposition:  HOME   Recommendations for Outpatient Follow-up:  1. Follow up with PCP in 1 weeks 2. Follow up with [facility] in 4 weeks   Home Health:  PT   Discharge Condition: STABLE   CODE STATUS: FULL    Brief Hospitalization Summary: Please see all hospital notes, images, labs for full details of the hospitalization. Brief Narrative:  74 y.o.femalewith medical history significant ofanemia, anxiety, osteoarthritis, chronic back pain, COPD, GERD, hypertension, hypothyroidism whowas admitted from 09/27/2019 until 10/01/19 due to COVID-19 pneumonia and nowis coming to the emergency department due to havingmultiple episodes of emesis since the weekend associated with 3 loose stools BM daily since she was discharged.  Assessment & Plan:   Principal Problem:   Sepsis due to undetermined organism [facility]) Active Problems:   Hypothyroidism   GERD (gastroesophageal reflux disease)   Normocytic anemia   Leukocytosis   COPD exacerbation RULED OUT   Hypertension   Hyperglycemia   Acute gastroenteritis   Acute respiratory distress   C. difficile diarrhea   Heme positive stool  1. C diff diarrhea - Diarrhea RESOLVED.   Pt started on oral vancomycin for moderate to nonsevere disease with no sepsis presentation.  Continue precautions as ordered.  Pt will need to treat for full 10 days.  Improving. Follow up with [facility].   2. Covid pneumonia - Pt was fully treated during recent hospitalization.   3. Leukocytosis - secondary to C diff infection. WBC trending down.  4. Anemia with heme positive stool - Appreciate GI consult, hg has remained stable, follow up with GI in 4 weeks outpatient for further work up.    5. Hypertension - stable.  6. Generalized weakness - multifactorial, PT  recommending HHPT.   7. GERD - protonix for GI protection.  8. Hypothyroidism - stable.  9. Chronic hypoxic respiratory failure 10. COPD Exacerbation RULED OUT  DVT prophylaxis: SCD Code Status:  Full  Family Communication:  Disposition:    Discharge Diagnoses:  Principal Problem:   Sepsis due to undetermined organism [facility]) Active Problems:   Hypothyroidism   GERD (gastroesophageal reflux disease)   Normocytic anemia   Leukocytosis   COPD exacerbation (HCC)   Hypertension   Hyperglycemia   Acute gastroenteritis   Acute respiratory distress   C. difficile diarrhea   Heme positive stool   Discharge Instructions: Discharge Instructions    MyChart COVID-19 home monitoring program   Complete by: Oct 13, 2019    Is the patient willing to use the [facility] for home monitoring?: Yes     Allergies as of 10/10/2019      Reactions   Penicillins Itching      Vicodin [hydrocodone-acetaminophen] Nausea And Vomiting      Medication List    STOP taking these medications   dexamethasone 4 MG tablet Commonly known as: DECADRON   zinc sulfate 220 (50 Zn) MG capsule     TAKE these medications   albuterol 108 (90 Base) MCG/ACT inhaler Commonly known as: VENTOLIN HFA Inhale 1-2 puffs into the lungs every 6 (six) hours as needed for wheezing or shortness of breath.   ALPRAZolam 1 MG tablet Commonly known as: XANAX Take 1 mg by mouth every 8 (eight) hours as needed for anxiety.   ascorbic acid 500 MG tablet Commonly known as:  VITAMIN C Take 1 tablet (500 mg total) by mouth daily for 10 days.   dextromethorphan 30 MG/5ML liquid Commonly known as: DELSYM Take 2.5 mLs (15 mg total) by mouth every 6 (six) hours as needed for cough.   ipratropium-albuterol 0.5-2.5 (3) MG/3ML Soln Commonly known as: DUONEB Take 3 mLs by nebulization every 6 (six) hours as needed (shortness of breath or wheezing).   levothyroxine 50 MCG tablet Commonly known as: SYNTHROID Take 50  mcg by mouth daily.   oxyCODONE 15 MG immediate release tablet Commonly known as: ROXICODONE Take 15 mg by mouth 3 (three) times daily as needed for pain.   pantoprazole 40 MG tablet Commonly known as: PROTONIX TAKE 1 TABLET BY MOUTH DAILY. What changed:   how much to take  when to take this  reasons to take this   Trelegy Ellipta 100-62.5-25 MCG/INH Aepb Generic drug: Fluticasone-Umeclidin-Vilant Inhale 1 puff into the lungs daily.   vancomycin 125 MG capsule Commonly known as: VANCOCIN Take 1 capsule (125 mg total) by mouth 4 (four) times daily for 8 days. Through 10/18/2019   Vitamin D (Ergocalciferol) 1.25 MG (50000 UNIT) Caps capsule Commonly known as: DRISDOL Take 50,000 Units by mouth every 7 (seven) days.      Follow-up Information    Lucianne Lei, MD. Schedule an appointment as soon as possible for a visit in 1 week(s).   Specialty: Family Medicine Contact information: [address] [place] [address] [phone]        [facility]. Schedule an appointment as soon as possible for a visit in 4 week(s).   Contact information: [place] [phone]         Allergies  Allergen Reactions  . Penicillins Itching       . Vicodin [Hydrocodone-Acetaminophen] Nausea And Vomiting   Allergies as of 10/10/2019      Reactions   Penicillins Itching      Vicodin [hydrocodone-acetaminophen] Nausea And Vomiting      Medication List    STOP taking these medications   dexamethasone 4 MG tablet Commonly known as: DECADRON   zinc sulfate 220 (50 Zn) MG capsule     TAKE these medications   albuterol 108 (90 Base) MCG/ACT inhaler Commonly known as: VENTOLIN HFA Inhale 1-2 puffs into the lungs every 6 (six) hours as needed for wheezing or shortness of breath.   ALPRAZolam 1 MG tablet Commonly known as: XANAX Take 1 mg by mouth every 8 (eight) hours as needed for anxiety.    ascorbic acid 500 MG tablet Commonly known as: VITAMIN C Take 1 tablet (500 mg total) by mouth daily for 10 days.   dextromethorphan 30 MG/5ML liquid Commonly known as: DELSYM Take 2.5 mLs (15 mg total) by mouth every 6 (six) hours as needed for cough.   ipratropium-albuterol 0.5-2.5 (3) MG/3ML Soln Commonly known as: DUONEB Take 3 mLs by nebulization every 6 (six) hours as needed (shortness of breath or wheezing).   levothyroxine 50 MCG tablet Commonly known as: SYNTHROID Take 50 mcg by mouth daily.   oxyCODONE 15 MG immediate release tablet Commonly known as: ROXICODONE Take 15 mg by mouth 3 (three) times daily as needed for pain.   pantoprazole 40 MG tablet Commonly known as: PROTONIX TAKE 1 TABLET BY MOUTH DAILY. What changed:   how much to take  when to take this  reasons to take this   Trelegy Ellipta 100-62.5-25 MCG/INH Aepb Generic drug: Fluticasone-Umeclidin-Vilant  Inhale 1 puff into the lungs daily.   vancomycin 125 MG capsule Commonly known as: VANCOCIN Take 1 capsule (125 mg total) by mouth 4 (four) times daily for 8 days. Through 10/18/2019   Vitamin D (Ergocalciferol) 1.25 MG (50000 UNIT) Caps capsule Commonly known as: DRISDOL Take 50,000 Units by mouth every 7 (seven) days.       Procedures/Studies: DG Chest 2 View  Result Date: 09/27/2019 CLINICAL DATA:  Cough. EXAM: CHEST - 2 VIEW COMPARISON:  November 06, 2018 FINDINGS: The lungs are hyperinflated. Moderate severity diffusely increased interstitial lung markings are seen. This is similar in appearance when compared to the prior study. Mild, predominant stable right apical scarring and/or atelectasis is seen. Mild areas of atelectasis and/or infiltrate are seen along the periphery of the bilateral lung bases. This represents a new finding when compared to the prior study. There is no evidence of a pleural effusion or pneumothorax. The heart size and mediastinal contours are within normal limits. There is  moderate severity calcification of the aortic arch. Degenerative changes seen throughout the thoracic spine. IMPRESSION: 1. Mild bibasilar atelectasis and/or infiltrate, new when compared to the prior study. 2. Chronic interstitial lung disease with stable right apical scarring and/or atelectasis. Electronically Signed   By: Virgina Norfolk M.D.   On: 09/27/2019 21:11   CT ABDOMEN PELVIS W CONTRAST  Result Date: 10/07/2019 CLINICAL DATA:  Abdominal pain and elevated white blood cell count, history of COVID-19 positivity EXAM: CT ABDOMEN AND PELVIS WITH CONTRAST TECHNIQUE: Multidetector CT imaging of the abdomen and pelvis was performed using the standard protocol following bolus administration of intravenous contrast. CONTRAST:  35mL OMNIPAQUE IOHEXOL 300 MG/ML  SOLN COMPARISON:  Chest x-ray from earlier in the same day. FINDINGS: Lower chest: Lung bases show patchy opacities with a somewhat nodular component consistent with the patient's given clinical history of COVID-19 positivity. No sizable effusion is seen. Hepatobiliary: No focal liver abnormality is seen. No gallstones, gallbladder wall thickening, or biliary dilatation. Pancreas: Unremarkable. No pancreatic ductal dilatation or surrounding inflammatory changes. Spleen: Normal in size without focal abnormality. Adrenals/Urinary Tract: Adrenal glands are within normal limits. Kidneys demonstrate a normal enhancement pattern. No renal calculi or obstructive changes are noted. Small 1 cm cyst is noted within the left kidney. No ureteral stones are noted. The bladder is partially distended. Stomach/Bowel: Scattered diverticular change of the colon is noted without definitive diverticulitis. The appendix is not well visualized although no inflammatory changes are seen to suggest appendicitis. Small bowel shows no obstructive change. No gastric abnormality is seen. Vascular/Lymphatic: Aortic atherosclerosis. No enlarged abdominal or pelvic lymph nodes.  Reproductive: Status post hysterectomy. No adnexal masses. Other: No abdominal wall hernia or abnormality. No abdominopelvic ascites. Musculoskeletal: Left hip replacement is noted. No acute bony abnormality is seen. IMPRESSION: Diverticulosis without evidence of diverticulitis. Changes in the lung bases consistent with the given clinical history of COVID-19 positivity. No other focal abnormality is seen. Electronically Signed   By: Inez Catalina M.D.   On: 10/07/2019 21:51   DG Chest Port 1 View  Result Date: 10/07/2019 CLINICAL DATA:  COVID, shortness of breath EXAM: PORTABLE CHEST 1 VIEW COMPARISON:  09/27/2019 FINDINGS: Lungs remain hyperinflated. Patchy bilateral airspace opacities and interstitial prominence throughout the lungs. Overall, patchy opacities slightly increased since prior study. Heart is normal size. No effusions or pneumothorax. No acute bony abnormality. IMPRESSION: COPD. Patchy bilateral airspace disease slightly worsening since prior study Electronically Signed   By: Rolm Baptise M.D.  On: 10/07/2019 19:23      Subjective: Pt reports diarrhea resolved, feeling much better, agreeable to going home today  Discharge Exam: Vitals:   10/09/19 2101 10/10/19 0510  BP: (!) 160/78 128/75  Pulse: (!) 105 94  Resp: 20 16  Temp: 99.2 F (37.3 C) 99.1 F (37.3 C)  SpO2: 93% 95%   Vitals:   10/09/19 0800 10/09/19 1327 10/09/19 2101 10/10/19 0510  BP: 125/62 (!) 145/69 (!) 160/78 128/75  Pulse: (!) 103 97 (!) 105 94  Resp: 20 20 20 16   Temp: 99.2 F (37.3 C) 98.8 F (37.1 C) 99.2 F (37.3 C) 99.1 F (37.3 C)  TempSrc: Oral Oral Oral Oral  SpO2: 99% 99% 93% 95%  Weight:      Height:       General: Pt is alert, awake, not in acute distress Cardiovascular: RRR, S1/S2 +, no rubs, no gallops Respiratory: CTA bilaterally, no wheezing, no rhonchi Abdominal: Soft, NT, ND, bowel sounds + Extremities: no edema, no cyanosis   The results of significant diagnostics from this  hospitalization (including imaging, microbiology, ancillary and laboratory) are listed below for reference.     Microbiology: Recent Results (from the past 240 hour(s))  Blood Culture (routine x 2)     Status: None (Preliminary result)   Collection Time: 10/07/19  7:54 PM   Specimen: BLOOD RIGHT ARM  Result Value Ref Range Status   Specimen Description BLOOD RIGHT ARM  Final   Special Requests   Final    BOTTLES DRAWN AEROBIC AND ANAEROBIC Blood Culture adequate volume   Culture   Final    NO GROWTH 3 DAYS Performed at [facility], [place], [address], [place], [place] [address]    Report Status PENDING  Incomplete  Blood Culture (routine x 2)     Status: None (Preliminary result)   Collection Time: 10/07/19  8:07 PM   Specimen: BLOOD RIGHT HAND  Result Value Ref Range Status   Specimen Description BLOOD RIGHT HAND  Final   Special Requests   Final    BOTTLES DRAWN AEROBIC AND ANAEROBIC Blood Culture adequate volume   Culture   Final    NO GROWTH 3 DAYS Performed at [facility], [address] [place], [place] [address]    Report Status PENDING  Incomplete  C Difficile Quick Screen w PCR reflex     Status: Abnormal   Collection Time: 10/08/19  7:42 AM   Specimen: STOOL  Result Value Ref Range Status   C Diff antigen POSITIVE (A) NEGATIVE Final   C Diff toxin NEGATIVE NEGATIVE Final   C Diff interpretation Results are indeterminate. See PCR results.  Final    Comment: Performed at [facility], [address], [place], [place] [address]  C. Diff by PCR, Reflexed     Status: Abnormal   Collection Time: 10/08/19  7:42 AM  Result Value Ref Range Status   Toxigenic C. Difficile by PCR POSITIVE (A) NEGATIVE Final    Comment: Positive for toxigenic C. difficile with little to no toxin production. Only treat if clinical presentation suggests symptomatic illness. Performed at [facility], [place] [address] [place], [place] [address]      Labs: BNP (last 3 results) No results  for input(s): BNP in the last 8760 hours. Basic Metabolic Panel: Recent Labs  Lab 10/07/19 1839 10/08/19 0327 10/09/19 0557 10/10/19 0459  NA 137 139 139 136  K 4.5 3.9 3.6 3.3*  CL 94* 100 101 101  CO2 31 32 31 29  GLUCOSE 145* 87 81 95  BUN 36* 28* 17 11  CREATININE 1.14* 0.85 0.93 0.79  CALCIUM 8.3* 7.4* 7.6* 7.3*  MG  --   --  1.7  --    Liver Function Tests: Recent Labs  Lab 10/07/19 1839 10/08/19 0327 10/09/19 0557 10/10/19 0459  AST 17 13* 18 16  ALT 12 9 9 9   ALKPHOS 90 60 58 48  BILITOT 0.8 0.5 0.5 0.5  PROT 7.8 5.5* 6.0* 5.6*  ALBUMIN 2.3* 1.6* 1.7* 1.6*   No results for input(s): LIPASE, AMYLASE in the last 168 hours. No results for input(s): AMMONIA in the last 168 hours. CBC: Recent Labs  Lab 10/07/19 1839 10/08/19 0327 10/09/19 0557 10/10/19 0459  WBC 39.0* 16.0* 9.4 6.8  NEUTROABS 35.8* 14.3* 8.1* 5.5  HGB 10.6* 7.6* 8.2* 7.2*  HCT 36.3 26.2* 28.9* 25.0*  MCV 89.9 90.0 92.3 90.3  PLT 494* 278 334 342   Cardiac Enzymes: No results for input(s): CKTOTAL, CKMB, CKMBINDEX, TROPONINI in the last 168 hours. BNP: Invalid input(s): POCBNP CBG: No results for input(s): GLUCAP in the last 168 hours. D-Dimer No results for input(s): DDIMER in the last 72 hours. Hgb A1c No results for input(s): HGBA1C in the last 72 hours. Lipid Profile No results for input(s): CHOL, HDL, LDLCALC, TRIG, CHOLHDL, LDLDIRECT in the last 72 hours. Thyroid function studies No results for input(s): TSH, T4TOTAL, T3FREE, THYROIDAB in the last 72 hours.  Invalid input(s): FREET3 Anemia work up No results for input(s): VITAMINB12, FOLATE, FERRITIN, TIBC, IRON, RETICCTPCT in the last 72 hours. Urinalysis    Component Value Date/Time   COLORURINE AMBER (A) 06/21/2018 1450   APPEARANCEUR CLOUDY (A) 06/21/2018 1450   LABSPEC 1.020 06/21/2018 1450   PHURINE 5.0 06/21/2018 1450   GLUCOSEU NEGATIVE 06/21/2018 1450   HGBUR NEGATIVE 06/21/2018 1450   BILIRUBINUR NEGATIVE  06/21/2018 1450   KETONESUR NEGATIVE 06/21/2018 1450   PROTEINUR 30 (A) 06/21/2018 1450   UROBILINOGEN 0.2 03/25/2008 1702   NITRITE NEGATIVE 06/21/2018 1450   LEUKOCYTESUR TRACE (A) 06/21/2018 1450   Sepsis Labs Invalid input(s): PROCALCITONIN,  WBC,  LACTICIDVEN Microbiology Recent Results (from the past 240 hour(s))  Blood Culture (routine x 2)     Status: None (Preliminary result)   Collection Time: 10/07/19  7:54 PM   Specimen: BLOOD RIGHT ARM  Result Value Ref Range Status   Specimen Description BLOOD RIGHT ARM  Final   Special Requests   Final    BOTTLES DRAWN AEROBIC AND ANAEROBIC Blood Culture adequate volume   Culture   Final    NO GROWTH 3 DAYS Performed at [facility], [address] [place], [place] [address]    Report Status PENDING  Incomplete  Blood Culture (routine x 2)     Status: None (Preliminary result)   Collection Time: 10/07/19  8:07 PM   Specimen: BLOOD RIGHT HAND  Result Value Ref Range Status   Specimen Description BLOOD RIGHT HAND  Final   Special Requests   Final    BOTTLES DRAWN AEROBIC AND ANAEROBIC Blood Culture adequate volume   Culture   Final    NO GROWTH 3 DAYS Performed at [facility], [address], [place],  [address]    Report Status PENDING  Incomplete  C Difficile Quick Screen w PCR reflex     Status: Abnormal   Collection Time: 10/08/19  7:42 AM   Specimen: STOOL  Result Value Ref Range Status  C Diff antigen POSITIVE (A) NEGATIVE Final   C Diff toxin NEGATIVE NEGATIVE Final   C Diff interpretation Results are indeterminate. See PCR results.  Final    Comment: Performed at [facility], [address] [place], [place] [address]  C. Diff by PCR, Reflexed     Status: Abnormal   Collection Time: 10/08/19  7:42 AM  Result Value Ref Range Status   Toxigenic C. Difficile by PCR POSITIVE (A) NEGATIVE Final    Comment: Positive for toxigenic C. difficile with little to no toxin production. Only treat if clinical presentation  suggests symptomatic illness. Performed at [facility], [place] [address], [place], [place] [address]    Time coordinating discharge: 31 mins  SIGNED:  Irwin Brakeman, MD  [facility] 10/10/2019, 2:33 PM How to contact the [facility] Attending or Consulting provider [place] or covering provider during after hours [place], for this patient?  1. Check the care team in [facility] and look for a) attending/consulting [facility] provider listed and b) the [facility] team listed 2. Log into [email or web address] and use Agency's universal password to access. If you do not have the password, please contact the hospital operator. 3. Locate the [facility] provider you are looking for under [facility] and page to a number that you can be directly reached. 4. If you still have difficulty reaching the provider, please page the [facility] (Director on Call) for the Hospitalists listed on amion for assistance.

## 2019-10-10 NOTE — Telephone Encounter (Signed)
Please schedule hospital follow-up in 4 weeks with any APP

## 2019-10-12 LAB — CULTURE, BLOOD (ROUTINE X 2)
Culture: NO GROWTH
Culture: NO GROWTH
Special Requests: ADEQUATE
Special Requests: ADEQUATE

## 2019-10-13 ENCOUNTER — Encounter: Payer: Self-pay | Admitting: Internal Medicine

## 2019-10-13 NOTE — Telephone Encounter (Signed)
PATIENT SCHEDULED AND LETTER SENT  °

## 2019-10-14 ENCOUNTER — Institutional Professional Consult (permissible substitution): Payer: Medicare HMO | Admitting: Internal Medicine

## 2019-10-15 ENCOUNTER — Encounter: Payer: Self-pay | Admitting: Internal Medicine

## 2019-10-26 DIAGNOSIS — J441 Chronic obstructive pulmonary disease with (acute) exacerbation: Secondary | ICD-10-CM | POA: Diagnosis not present

## 2019-10-27 DIAGNOSIS — I10 Essential (primary) hypertension: Secondary | ICD-10-CM | POA: Diagnosis not present

## 2019-10-27 DIAGNOSIS — J45909 Unspecified asthma, uncomplicated: Secondary | ICD-10-CM | POA: Diagnosis not present

## 2019-10-27 DIAGNOSIS — J449 Chronic obstructive pulmonary disease, unspecified: Secondary | ICD-10-CM | POA: Diagnosis not present

## 2019-10-27 DIAGNOSIS — E039 Hypothyroidism, unspecified: Secondary | ICD-10-CM | POA: Diagnosis not present

## 2019-11-09 NOTE — Progress Notes (Signed)
Primary Care Physician: Lucianne Lei, MD  Primary Gastroenterologist:  Garfield Cornea, MD   Chief Complaint  Patient presents with  . Follow-up    hospital f/u, diarrhea    HPI: Marilyn Solomon is a 74 y.o. female here for hospital follow up. Patient admitted first of May with Covid 19 pneumonia. She was treated with dexamethasone and remedesivir. Hospitalized from May 1 to May 5. Presented back to ED with vomiting and diarrhea a week later. It was noted that her Hgb dropped from 10.6 to 7.6 during her second hospitalization although appears her baseline hgb chronically is 9-10 per epic review. Stool was heme positive. She tested positive for Cdiff as well.  Last hemoglobin on May 14 was 7.2.    Patient states that when she had diarrhea that her hemorrhoids flared, she had some bright red blood noted on the toilet tissue and some discomfort.  Her bowel movements have returned to normal.  She is having 2 formed bowel movements daily.  No rectal bleeding.  No abdominal pain.  Appetite is normal.  Denies any weight loss.  Rarely has heartburn for which she takes pantoprazole as needed only.  No dysphagia.  Patient was seen in March 2019 for positive Cologuard test, transfusion dependent anemia, abnormal esophagus on CTA chest.  Colonoscopy in May 2019 which was unremarkable except for diverticulosis.  EGD at the same time showed nonobstructing Schatzki ring, mild erosive reflux esophagitis, small hiatal hernia, gastritis but no H. pylori.  Patient was advised to undergo capsule endoscopy for evaluation of anemia, but she declined.  Oxygen dependent copd for awhile. 2 liters.     Current Outpatient Medications  Medication Sig Dispense Refill  . albuterol (VENTOLIN HFA) 108 (90 Base) MCG/ACT inhaler Inhale 1-2 puffs into the lungs every 6 (six) hours as needed for wheezing or shortness of breath.    . ALPRAZolam (XANAX) 1 MG tablet Take 1 mg by mouth every 8 (eight) hours as needed for  anxiety.     Marland Kitchen dextromethorphan (DELSYM) 30 MG/5ML liquid Take 2.5 mLs (15 mg total) by mouth every 6 (six) hours as needed for cough. (Patient taking differently: Take 15 mg by mouth as needed for cough. ) 89 mL 0  . Fluticasone-Umeclidin-Vilant (TRELEGY ELLIPTA) 100-62.5-25 MCG/INH AEPB Inhale 1 puff into the lungs daily.     Marland Kitchen levothyroxine (SYNTHROID) 50 MCG tablet Take 50 mcg by mouth daily.     Marland Kitchen oxyCODONE (ROXICODONE) 15 MG immediate release tablet Take 15 mg by mouth 3 (three) times daily as needed for pain.     . pantoprazole (PROTONIX) 40 MG tablet TAKE 1 TABLET BY MOUTH DAILY. (Patient taking differently: Take 40 mg by mouth as needed (for acid reflux). ) 90 tablet 3  . Vitamin D, Ergocalciferol, (DRISDOL) 1.25 MG (50000 UNIT) CAPS capsule Take 50,000 Units by mouth every 7 (seven) days.    Marland Kitchen ipratropium-albuterol (DUONEB) 0.5-2.5 (3) MG/3ML SOLN Take 3 mLs by nebulization every 6 (six) hours as needed (shortness of breath or wheezing). (Patient not taking: Reported on 11/10/2019) 360 mL 3   No current facility-administered medications for this visit.    Allergies as of 11/10/2019 - Review Complete 11/10/2019  Allergen Reaction Noted  . Penicillins Itching 12/05/2013  . Vicodin [hydrocodone-acetaminophen] Nausea And Vomiting 04/17/2011    ROS:  General: Negative for anorexia, weight loss, fever, chills, fatigue, weakness. ENT: Negative for hoarseness, difficulty swallowing , nasal congestion. CV: Negative for chest pain, angina, palpitations,  positive dyspnea on exertion, peripheral edema.  Respiratory: Negative for dyspnea at rest, positive dyspnea on exertion, cough, sputum, wheezing.  GI: See history of present illness. GU:  Negative for dysuria, hematuria, urinary incontinence, urinary frequency, nocturnal urination.  Endo: Negative for unusual weight change.    Physical Examination:   BP (!) 172/92   Pulse (!) 120   Temp (!) 96.9 F (36.1 C) (Temporal)   Ht 5\' 5"   (1.651 m)   Wt 114 lb 9.6 oz (52 kg)   BMI 19.07 kg/m    I personally rechecked her pulse after she been sitting in the exam room.  Her pulse was 98.  General: Thin female in no acute distress.  Nasal cannula in place. Eyes: No icterus. Mouth: mased. Lungs: Scattered rhonchi bilaterally.  Heart: Regular rate and rhythm, no murmurs rubs or gallops.  Abdomen: Bowel sounds are normal, nontender, nondistended, no hepatosplenomegaly or masses, no abdominal bruits or hernia , no rebound or guarding.   Extremities: No lower extremity edema. No clubbing or deformities. Neuro: Alert and oriented x 4   Skin: Warm and dry, no jaundice.   Psych: Alert and cooperative, normal mood and affect.  Labs:  Lab Results  Component Value Date   CREATININE 0.79 10/10/2019   BUN 11 10/10/2019   NA 136 10/10/2019   K 3.3 (L) 10/10/2019   CL 101 10/10/2019   CO2 29 10/10/2019   Lab Results  Component Value Date   ALT 9 10/10/2019   AST 16 10/10/2019   ALKPHOS 48 10/10/2019   BILITOT 0.5 10/10/2019   Lab Results  Component Value Date   WBC 6.8 10/10/2019   HGB 7.2 (L) 10/10/2019   HCT 25.0 (L) 10/10/2019   MCV 90.3 10/10/2019   PLT 342 10/10/2019        Imaging Studies: No results found.   Impression/plan:  Pleasant 74 year old female with recent admission for COVID-19 pneumonia, second admission was C. difficile diarrhea.  We were consulted while inpatient back in May for diarrhea.  Consult for heme positive stools in the setting of C. difficile with drop in hemoglobin.  She did not require blood transfusion.  She has a history of chronic normocytic anemia with EGD and colonoscopy in 2019 as outlined above.  Never completed capsule study as suggested at that time because of history of transfusion dependent anemia.  With recent C. difficile, flare of hemorrhoids, heme positive stool not unusual.  At this point we will recheck her CBC along with iron studies prior to making any further  recommendations regarding a potential work-up needed.  Patient has O2 dependent COPD at baseline, prior to COVID-19 pneumonia, which we will need to consider moving forward with any potential endoscopic evaluation that may be needed.  We will follow up with further recommendations after labs received.  Routine office visit in 3 months.  Advised her to follow-up with PCP regarding elevated blood pressure.

## 2019-11-10 ENCOUNTER — Other Ambulatory Visit: Payer: Self-pay

## 2019-11-10 ENCOUNTER — Encounter: Payer: Self-pay | Admitting: Gastroenterology

## 2019-11-10 ENCOUNTER — Ambulatory Visit: Payer: Medicare Other | Admitting: Gastroenterology

## 2019-11-10 VITALS — BP 172/92 | HR 120 | Temp 96.9°F | Ht 65.0 in | Wt 114.6 lb

## 2019-11-10 DIAGNOSIS — D649 Anemia, unspecified: Secondary | ICD-10-CM

## 2019-11-10 DIAGNOSIS — R195 Other fecal abnormalities: Secondary | ICD-10-CM

## 2019-11-10 DIAGNOSIS — A0472 Enterocolitis due to Clostridium difficile, not specified as recurrent: Secondary | ICD-10-CM | POA: Diagnosis not present

## 2019-11-10 NOTE — Patient Instructions (Signed)
1. Please go to [facility] at your convenience to update labs. Try to go this week. We will contact you with results as available. 2. Please call if you have any recurrent diarrhea. 3. Return office visit in 3 months.

## 2019-11-10 NOTE — Progress Notes (Signed)
Cc'ed to pcp °

## 2019-11-12 DIAGNOSIS — D649 Anemia, unspecified: Secondary | ICD-10-CM | POA: Diagnosis not present

## 2019-11-12 DIAGNOSIS — R195 Other fecal abnormalities: Secondary | ICD-10-CM | POA: Diagnosis not present

## 2019-11-12 DIAGNOSIS — A0472 Enterocolitis due to Clostridium difficile, not specified as recurrent: Secondary | ICD-10-CM | POA: Diagnosis not present

## 2019-11-13 ENCOUNTER — Ambulatory Visit: Payer: Medicare Other | Admitting: Gastroenterology

## 2019-11-13 LAB — CBC WITH DIFFERENTIAL/PLATELET
Absolute Monocytes: 855 cells/uL (ref 200–950)
Basophils Absolute: 36 cells/uL (ref 0–200)
Basophils Relative: 0.4 %
Eosinophils Absolute: 207 cells/uL (ref 15–500)
Eosinophils Relative: 2.3 %
HCT: 31.7 % — ABNORMAL LOW (ref 35.0–45.0)
Hemoglobin: 9.9 g/dL — ABNORMAL LOW (ref 11.7–15.5)
Lymphs Abs: 2673 cells/uL (ref 850–3900)
MCH: 26.1 pg — ABNORMAL LOW (ref 27.0–33.0)
MCHC: 31.2 g/dL — ABNORMAL LOW (ref 32.0–36.0)
MCV: 83.6 fL (ref 80.0–100.0)
MPV: 9.9 fL (ref 7.5–12.5)
Monocytes Relative: 9.5 %
Neutro Abs: 5229 cells/uL (ref 1500–7800)
Neutrophils Relative %: 58.1 %
Platelets: 316 10*3/uL (ref 140–400)
RBC: 3.79 10*6/uL — ABNORMAL LOW (ref 3.80–5.10)
RDW: 14.5 % (ref 11.0–15.0)
Total Lymphocyte: 29.7 %
WBC: 9 10*3/uL (ref 3.8–10.8)

## 2019-11-13 LAB — IRON,TIBC AND FERRITIN PANEL
%SAT: 13 % (calc) — ABNORMAL LOW (ref 16–45)
Ferritin: 78 ng/mL (ref 16–288)
Iron: 43 ug/dL — ABNORMAL LOW (ref 45–160)
TIBC: 322 mcg/dL (calc) (ref 250–450)

## 2019-11-14 DIAGNOSIS — I10 Essential (primary) hypertension: Secondary | ICD-10-CM | POA: Diagnosis not present

## 2019-11-14 DIAGNOSIS — E039 Hypothyroidism, unspecified: Secondary | ICD-10-CM | POA: Diagnosis not present

## 2019-11-14 DIAGNOSIS — J441 Chronic obstructive pulmonary disease with (acute) exacerbation: Secondary | ICD-10-CM | POA: Diagnosis not present

## 2019-11-14 DIAGNOSIS — D6489 Other specified anemias: Secondary | ICD-10-CM | POA: Diagnosis not present

## 2019-11-14 DIAGNOSIS — M4000 Postural kyphosis, site unspecified: Secondary | ICD-10-CM | POA: Diagnosis not present

## 2019-11-26 DIAGNOSIS — J441 Chronic obstructive pulmonary disease with (acute) exacerbation: Secondary | ICD-10-CM | POA: Diagnosis not present

## 2019-12-02 ENCOUNTER — Other Ambulatory Visit: Payer: Self-pay | Admitting: Emergency Medicine

## 2019-12-02 DIAGNOSIS — D649 Anemia, unspecified: Secondary | ICD-10-CM

## 2019-12-26 DIAGNOSIS — J441 Chronic obstructive pulmonary disease with (acute) exacerbation: Secondary | ICD-10-CM | POA: Diagnosis not present

## 2019-12-29 DIAGNOSIS — Z961 Presence of intraocular lens: Secondary | ICD-10-CM | POA: Diagnosis not present

## 2019-12-29 DIAGNOSIS — H26491 Other secondary cataract, right eye: Secondary | ICD-10-CM | POA: Diagnosis not present

## 2019-12-29 DIAGNOSIS — H26493 Other secondary cataract, bilateral: Secondary | ICD-10-CM | POA: Diagnosis not present

## 2019-12-29 DIAGNOSIS — H26492 Other secondary cataract, left eye: Secondary | ICD-10-CM | POA: Diagnosis not present

## 2020-01-07 ENCOUNTER — Other Ambulatory Visit: Payer: Self-pay

## 2020-01-07 DIAGNOSIS — D649 Anemia, unspecified: Secondary | ICD-10-CM

## 2020-01-22 ENCOUNTER — Emergency Department (HOSPITAL_COMMUNITY): Payer: Medicare Other

## 2020-01-22 ENCOUNTER — Encounter (HOSPITAL_COMMUNITY): Payer: Self-pay | Admitting: *Deleted

## 2020-01-22 ENCOUNTER — Inpatient Hospital Stay (HOSPITAL_COMMUNITY)
Admission: EM | Admit: 2020-01-22 | Discharge: 2020-01-27 | DRG: 682 | Disposition: A | Discharge status: Home / Self Care | Payer: Medicare Other | Attending: Internal Medicine | Admitting: Internal Medicine

## 2020-01-22 ENCOUNTER — Other Ambulatory Visit: Payer: Self-pay

## 2020-01-22 DIAGNOSIS — K219 Gastro-esophageal reflux disease without esophagitis: Secondary | ICD-10-CM | POA: Diagnosis present

## 2020-01-22 DIAGNOSIS — J449 Chronic obstructive pulmonary disease, unspecified: Secondary | ICD-10-CM | POA: Diagnosis not present

## 2020-01-22 DIAGNOSIS — J189 Pneumonia, unspecified organism: Secondary | ICD-10-CM | POA: Diagnosis present

## 2020-01-22 DIAGNOSIS — Z91011 Allergy to milk products: Secondary | ICD-10-CM

## 2020-01-22 DIAGNOSIS — Z800 Family history of malignant neoplasm of digestive organs: Secondary | ICD-10-CM | POA: Diagnosis not present

## 2020-01-22 DIAGNOSIS — Z8616 Personal history of COVID-19: Secondary | ICD-10-CM

## 2020-01-22 DIAGNOSIS — Z9981 Dependence on supplemental oxygen: Secondary | ICD-10-CM

## 2020-01-22 DIAGNOSIS — Z87891 Personal history of nicotine dependence: Secondary | ICD-10-CM | POA: Diagnosis not present

## 2020-01-22 DIAGNOSIS — E860 Dehydration: Secondary | ICD-10-CM | POA: Diagnosis present

## 2020-01-22 DIAGNOSIS — E039 Hypothyroidism, unspecified: Secondary | ICD-10-CM | POA: Diagnosis not present

## 2020-01-22 DIAGNOSIS — N179 Acute kidney failure, unspecified: Secondary | ICD-10-CM | POA: Diagnosis not present

## 2020-01-22 DIAGNOSIS — Z79899 Other long term (current) drug therapy: Secondary | ICD-10-CM

## 2020-01-22 DIAGNOSIS — G9341 Metabolic encephalopathy: Secondary | ICD-10-CM | POA: Diagnosis not present

## 2020-01-22 DIAGNOSIS — R54 Age-related physical debility: Secondary | ICD-10-CM | POA: Diagnosis present

## 2020-01-22 DIAGNOSIS — A310 Pulmonary mycobacterial infection: Secondary | ICD-10-CM | POA: Diagnosis present

## 2020-01-22 DIAGNOSIS — J440 Chronic obstructive pulmonary disease with acute lower respiratory infection: Secondary | ICD-10-CM | POA: Diagnosis not present

## 2020-01-22 DIAGNOSIS — M549 Dorsalgia, unspecified: Secondary | ICD-10-CM | POA: Diagnosis present

## 2020-01-22 DIAGNOSIS — Z7951 Long term (current) use of inhaled steroids: Secondary | ICD-10-CM | POA: Diagnosis not present

## 2020-01-22 DIAGNOSIS — J441 Chronic obstructive pulmonary disease with (acute) exacerbation: Secondary | ICD-10-CM | POA: Diagnosis present

## 2020-01-22 DIAGNOSIS — Z8249 Family history of ischemic heart disease and other diseases of the circulatory system: Secondary | ICD-10-CM

## 2020-01-22 DIAGNOSIS — I959 Hypotension, unspecified: Secondary | ICD-10-CM | POA: Diagnosis not present

## 2020-01-22 DIAGNOSIS — F329 Major depressive disorder, single episode, unspecified: Secondary | ICD-10-CM | POA: Diagnosis present

## 2020-01-22 DIAGNOSIS — Z880 Allergy status to penicillin: Secondary | ICD-10-CM | POA: Diagnosis not present

## 2020-01-22 DIAGNOSIS — F419 Anxiety disorder, unspecified: Secondary | ICD-10-CM | POA: Diagnosis present

## 2020-01-22 DIAGNOSIS — J159 Unspecified bacterial pneumonia: Secondary | ICD-10-CM | POA: Diagnosis present

## 2020-01-22 DIAGNOSIS — Z833 Family history of diabetes mellitus: Secondary | ICD-10-CM

## 2020-01-22 DIAGNOSIS — G8929 Other chronic pain: Secondary | ICD-10-CM | POA: Diagnosis present

## 2020-01-22 DIAGNOSIS — I10 Essential (primary) hypertension: Secondary | ICD-10-CM | POA: Diagnosis not present

## 2020-01-22 DIAGNOSIS — Z90710 Acquired absence of both cervix and uterus: Secondary | ICD-10-CM

## 2020-01-22 DIAGNOSIS — N189 Chronic kidney disease, unspecified: Secondary | ICD-10-CM | POA: Diagnosis not present

## 2020-01-22 DIAGNOSIS — J9621 Acute and chronic respiratory failure with hypoxia: Secondary | ICD-10-CM | POA: Diagnosis not present

## 2020-01-22 DIAGNOSIS — R05 Cough: Secondary | ICD-10-CM | POA: Diagnosis not present

## 2020-01-22 DIAGNOSIS — Z79890 Hormone replacement therapy (postmenopausal): Secondary | ICD-10-CM

## 2020-01-22 MED ORDER — ALBUTEROL SULFATE HFA 108 (90 BASE) MCG/ACT IN AERS
4.0000 | INHALATION_SPRAY | Freq: Once | RESPIRATORY_TRACT | Status: AC
  Administered 2020-01-22: 4 via RESPIRATORY_TRACT
  Filled 2020-01-22: qty 6.7

## 2020-01-22 NOTE — ED Provider Notes (Signed)
[facility] EMERGENCY DEPARTMENT Provider Note   CSN: [id number] Arrival date & time: 01/22/20  1534     History Chief Complaint  Patient presents with  . Cough    Marilyn Solomon is a 74 y.o. female.  Patient with a history of hypertension, history of thyroidism, GERD, COPD on 2 L of home oxygen presenting with cough for the past 2 days.  States she has a cough productive of clear thick yellow mucus associated with rhinorrhea and congestion.  She is been using over-the-counter cough remedies and albuterol at home without relief.  She had nausea and vomiting earlier in the week on Monday and Tuesday with today being Thursday.  Has not had any vomiting for the past 2 days.  No diarrhea.  Has had chills and aches but no documented fever.  Says some chest tightness with coughing" when my oxygen runs out".  Complains of some abdominal soreness currently with generalized weakness and fatigue.  She does not feel acutely short of breath.  Denies any leg pain or leg swelling.  Did not Covid this past May  The history is provided by the patient.  Cough Associated symptoms: myalgias, rhinorrhea and shortness of breath   Associated symptoms: no chest pain and no headaches        Past Medical History:  Diagnosis Date  . Anemia   . Anxiety   . Arthritis   . Chronic back pain   . COPD (chronic obstructive pulmonary disease) ([place])   . GERD (gastroesophageal reflux disease)   . Hypertension   . Hypothyroidism   . Reflux    no medications currently, asymptomatic  . Thyroid disease     Patient Active Problem List   Diagnosis Date Noted  . Heme positive stool   . Acute gastroenteritis 10/08/2019  . Acute respiratory distress 10/08/2019  . C. difficile diarrhea 10/08/2019  . Sepsis due to undetermined organism ([place]) 10/07/2019  . Pneumonia due to COVID-19 virus 09/28/2019  . Hypokalemia 11/07/2018  . Hyperglycemia 11/07/2018  . Acute on chronic respiratory failure with hypoxemia  ([place]) 06/21/2018  . COPD exacerbation ([place]) 06/02/2018  . Anemia 06/02/2018  . Hypertension 06/02/2018  . COPD with acute exacerbation ([place]) 01/31/2018  . Tachycardia 01/31/2018  . Hypoxia 01/31/2018  . Leukocytosis 01/31/2018  . Positive colorectal cancer screening using Cologuard test 08/21/2017  . Normocytic anemia   . Anxiety 06/30/2017  . Hypothyroidism 06/30/2017  . GERD (gastroesophageal reflux disease) 06/30/2017  . COPD (chronic obstructive pulmonary disease) ([place]) 06/30/2017  . Elevated liver enzymes 06/30/2017  . Severe Iron deficiency anemia 06/30/2017  . HCAP (healthcare-associated pneumonia) 06/28/2017  . CAP (community acquired pneumonia) 06/28/2017  . Second degree hemorrhoids   . Encounter for screening colonoscopy 10/28/2015    Past Surgical History:  Procedure Laterality Date  . ABDOMINAL HYSTERECTOMY    . BIOPSY  10/08/2017   Procedure: BIOPSY;  Surgeon: Daneil Dolin, MD;  Location: [facility];  Service: Endoscopy;;  gastric  . cataracts Bilateral   . COLONOSCOPY  2007   Dr. Gala Romney: normal rectum, diverticula   . COLONOSCOPY WITH PROPOFOL N/A 11/15/2015   Dr. Gala Romney: grade II hemorrhoids, diverticulosis  . COLONOSCOPY WITH PROPOFOL N/A 10/08/2017   Rourk: Diverticulosis  . ESOPHAGOGASTRODUODENOSCOPY (EGD) WITH PROPOFOL N/A 10/08/2017   Rourk: Nonobstructing Schatzki ring, mild erosive reflux esophagitis, small hiatal hernia, gastritis but no H. pylori.  . FRACTURE SURGERY Right 2005   Wrist  . JOINT REPLACEMENT Left 2000   hip  .  TOTAL HIP ARTHROPLASTY Left   . WRIST SURGERY Right      OB History   No obstetric history on file.     Family History  Problem Relation Age of Onset  . Bone cancer Sister   . Pancreatic cancer Sister   . Pneumonia Mother   . Heart attack Father   . Diabetes Sister   . Dementia Sister   . Colon cancer Neg Hx     Social History   Tobacco Use  . Smoking status: Former Smoker    Packs/day: 25.00    Years: 1.00     Pack years: 25.00    Types: Cigarettes    Quit date: 08/27/1998    Years since quitting: 21.4  . Smokeless tobacco: Never Used  . Tobacco comment: quit in 2000  Vaping Use  . Vaping Use: Never used  Substance Use Topics  . Alcohol use: No    Alcohol/week: 0.0 standard drinks  . Drug use: No    Home Medications Prior to Admission medications   Medication Sig Start Date End Date Taking? Authorizing Provider  albuterol (VENTOLIN HFA) 108 (90 Base) MCG/ACT inhaler Inhale 1-2 puffs into the lungs every 6 (six) hours as needed for wheezing or shortness of breath.    [provider]  ALPRAZolam Duanne Moron) 1 MG tablet Take 1 mg by mouth every 8 (eight) hours as needed for anxiety.  08/10/14   [provider]  dextromethorphan (DELSYM) 30 MG/5ML liquid Take 2.5 mLs (15 mg total) by mouth every 6 (six) hours as needed for cough. Patient taking differently: Take 15 mg by mouth as needed for cough.  10/01/19   Manuella Ghazi, Pratik D, DO  Fluticasone-Umeclidin-Vilant (TRELEGY ELLIPTA) 100-62.5-25 MCG/INH AEPB Inhale 1 puff into the lungs daily.  12/25/18   Sinda Du, MD  ipratropium-albuterol (DUONEB) 0.5-2.5 (3) MG/3ML SOLN Take 3 mLs by nebulization every 6 (six) hours as needed (shortness of breath or wheezing). Patient not taking: Reported on 11/10/2019 06/04/18 11/07/18  Heath Lark D, DO  levothyroxine (SYNTHROID) 50 MCG tablet Take 50 mcg by mouth daily.  09/11/18   [provider]  oxyCODONE (ROXICODONE) 15 MG immediate release tablet Take 15 mg by mouth 3 (three) times daily as needed for pain.     [provider]  pantoprazole (PROTONIX) 40 MG tablet TAKE 1 TABLET BY MOUTH DAILY. Patient taking differently: Take 40 mg by mouth as needed (for acid reflux).  05/27/18   Annitta Needs, NP  Vitamin D, Ergocalciferol, (DRISDOL) 1.25 MG (50000 UNIT) CAPS capsule Take 50,000 Units by mouth every 7 (seven) days.    [provider]    Allergies    Penicillins and  Vicodin [hydrocodone-acetaminophen]  Review of Systems   Review of Systems  Constitutional: Positive for activity change, appetite change and fatigue.  HENT: Positive for congestion and rhinorrhea.   Eyes: Positive for visual disturbance.  Respiratory: Positive for cough, chest tightness and shortness of breath.   Cardiovascular: Negative for chest pain and leg swelling.  Gastrointestinal: Positive for nausea and vomiting. Negative for abdominal pain.  Genitourinary: Negative for dysuria.  Musculoskeletal: Positive for arthralgias and myalgias.  Neurological: Positive for weakness. Negative for dizziness, light-headedness and headaches.   all other systems are negative except as noted in the HPI and PMH.    Physical Exam Updated Vital Signs BP 107/73 (BP Location: Right Arm)   Pulse (!) 115   Temp 98.4 F (36.9 C) (Oral)   Resp 18  Ht 5\' 5"  (1.651 m)   Wt 51.7 kg   SpO2 93%   BMI 18.97 kg/m   Physical Exam Vitals and nursing note reviewed.  Constitutional:      General: She is not in acute distress.    Appearance: She is well-developed.     Comments: Chronically ill-appearing  HENT:     Head: Normocephalic and atraumatic.     Mouth/Throat:     Pharynx: No oropharyngeal exudate.  Eyes:     Conjunctiva/sclera: Conjunctivae normal.     Pupils: Pupils are equal, round, and reactive to light.  Neck:     Comments: No meningismus. Cardiovascular:     Rate and Rhythm: Regular rhythm. Tachycardia present.     Heart sounds: Normal heart sounds. No murmur heard.   Pulmonary:     Effort: Pulmonary effort is normal. No respiratory distress.     Breath sounds: Normal breath sounds. No wheezing or rhonchi.     Comments: Diminished breath sounds bilateral Abdominal:     Palpations: Abdomen is soft.     Tenderness: There is no abdominal tenderness. There is no guarding or rebound.  Musculoskeletal:        General: No tenderness. Normal range of motion.     Cervical back:  Normal range of motion and neck supple.  Skin:    General: Skin is warm.  Neurological:     Mental Status: She is alert and oriented to person, place, and time.     Cranial Nerves: No cranial nerve deficit.     Motor: No abnormal muscle tone.     Coordination: Coordination normal.     Comments:  5/5 strength throughout. CN 2-12 intact.Equal grip strength.   Psychiatric:        Behavior: Behavior normal.     ED Results / Procedures / Treatments   Labs (all labs ordered are listed, but only abnormal results are displayed) Labs Reviewed  CBC WITH DIFFERENTIAL/PLATELET - Abnormal; Notable for the following components:      Result Value   WBC 11.0 (*)    Hemoglobin 10.2 (*)    MCH 25.5 (*)    MCHC 28.3 (*)    Platelets 423 (*)    Neutro Abs 8.7 (*)    Abs Immature Granulocytes 0.09 (*)    All other components within normal limits  BASIC METABOLIC PANEL - Abnormal; Notable for the following components:   Chloride 97 (*)    Glucose, Bld 135 (*)    BUN 46 (*)    Creatinine, Ser 1.63 (*)    Calcium 8.8 (*)    GFR calc non Af Amer 31 (*)    GFR calc Af Amer 36 (*)    All other components within normal limits  BRAIN NATRIURETIC PEPTIDE - Abnormal; Notable for the following components:   B Natriuretic Peptide 237.0 (*)    All other components within normal limits  D-DIMER, QUANTITATIVE (NOT AT [facility]) - Abnormal; Notable for the following components:   D-Dimer, Quant 3.72 (*)    All other components within normal limits  TROPONIN I (HIGH SENSITIVITY) - Abnormal; Notable for the following components:   Troponin I (High Sensitivity) 29 (*)    All other components within normal limits  SARS CORONAVIRUS 2 BY RT PCR [facility] ORDER, [place] LAB)  TROPONIN I (HIGH SENSITIVITY)    EKG EKG Interpretation  Date/Time:  Thursday January 22 2020 23:10:33 EDT Ventricular Rate:  93 PR Interval:  QRS Duration: 99 QT Interval:  354 QTC Calculation: 441 R  Axis:   77 Text Interpretation: Sinus rhythm Borderline short PR interval No significant change was found Confirmed by Ezequiel Essex [phone]) on 01/22/2020 11:21:49 PM   Radiology DG Chest Portable 1 View  Result Date: 01/22/2020 CLINICAL DATA:  Cough EXAM: PORTABLE CHEST 1 VIEW COMPARISON:  10/07/2019, 06/21/2018, 08/12/2018 01/31/2018 chest CT 06/30/2017 FINDINGS: Coarse reticular and slightly nodular opacities throughout the bilateral lungs, chronic to recent priors from 2001, progressive as compared with more remote exams. No acute superimposed confluent airspace disease or effusion. Stable cardiomediastinal silhouette. Aortic atherosclerosis. No pneumothorax. IMPRESSION: Chronic coarse reticular and slightly nodular opacities throughout the bilateral lungs consistent with chronic interstitial disease and possible chronic atypical or indolent infection. This is stable compared with most recent priors but is progressive as compared with more remote exams. No definite acute superimposed confluent airspace disease. Electronically Signed   By: Donavan Foil M.D.   On: 01/22/2020 16:53    Procedures Procedures (including critical care time)  Medications Ordered in ED Medications  albuterol (VENTOLIN HFA) 108 (90 Base) MCG/ACT inhaler 4 puff (has no administration in time range)    ED Course  I have reviewed the triage vital signs and the nursing notes.  Pertinent labs & imaging results that were available during my care of the patient were reviewed by me and considered in my medical decision making (see chart for details).    MDM Rules/Calculators/A&P                          2 days of productive cough.  Nausea and vomiting earlier in the week but none currently.  Abdomen soft without peritoneal signs.  Lungs are clear on auscultation.  She is not hypoxic on home oxygen which she was not wearing on initial arrival.  Chest x-ray shows stable reticular chronic appearing opacities.  Difficult to exclude acute infiltrate.  Labs showed AKI with creatinine 1.6.  Mild hypotension given gentle IV fluids.  Troponin elevated at 29 but EKG is unchanged.  No chest pain.  Patient generally weak and unable to ambulate.  No hypoxia on her home 2 L.  She is given steroids as well as bronchodilators.  Remains generally weak and unable to walk.  AKI likely secondary to GI illness earlier in the week.  She denies any chest pain or shortness of breath currently.  Elevated creatinine at 1.6.  GFR 31.  This precludes CT scan to rule out pulmonary embolism.  We will treat for possible pneumonia with antibiotics given her difficult interval chest x-ray.  May benefit from VQ scan in the morning  Continue gentle IV hydration and antibiotics overnight. Patient discussed with Dr. Claria Dice.  Marilyn Solomon was evaluated in Emergency Department on 01/22/2020 for the symptoms described in the history of present illness. She was evaluated in the context of the global COVID-19 pandemic, which necessitated consideration that the patient might be at risk for infection with the SARS-CoV-2 virus that causes COVID-19. Institutional protocols and algorithms that pertain to the evaluation of patients at risk for COVID-19 are in a state of rapid change based on information released by regulatory bodies including the CDC and federal and state organizations. These policies and algorithms were followed during the patient's care in the ED.  Final Clinical Impression(s) / ED Diagnoses Final diagnoses:  AKI (acute kidney injury) ([place])  Community acquired pneumonia, unspecified laterality  Dehydration  Rx / DC Orders ED Discharge Orders    None       Delia Slatten, Annie Main, MD 01/23/20 [phone]

## 2020-01-22 NOTE — ED Triage Notes (Signed)
Pt with cough thick yellow phlegm for past 2 days. Pt on home O2 and did not have her oxygen with her.

## 2020-01-23 ENCOUNTER — Emergency Department (HOSPITAL_COMMUNITY): Payer: Medicare Other

## 2020-01-23 ENCOUNTER — Encounter (HOSPITAL_COMMUNITY): Payer: Self-pay | Admitting: Family Medicine

## 2020-01-23 ENCOUNTER — Observation Stay (HOSPITAL_COMMUNITY): Payer: Medicare Other

## 2020-01-23 DIAGNOSIS — J159 Unspecified bacterial pneumonia: Secondary | ICD-10-CM | POA: Diagnosis present

## 2020-01-23 DIAGNOSIS — J9621 Acute and chronic respiratory failure with hypoxia: Secondary | ICD-10-CM | POA: Diagnosis not present

## 2020-01-23 DIAGNOSIS — J189 Pneumonia, unspecified organism: Secondary | ICD-10-CM | POA: Diagnosis not present

## 2020-01-23 DIAGNOSIS — Z880 Allergy status to penicillin: Secondary | ICD-10-CM | POA: Diagnosis not present

## 2020-01-23 DIAGNOSIS — Z79899 Other long term (current) drug therapy: Secondary | ICD-10-CM | POA: Diagnosis not present

## 2020-01-23 DIAGNOSIS — R918 Other nonspecific abnormal finding of lung field: Secondary | ICD-10-CM | POA: Diagnosis not present

## 2020-01-23 DIAGNOSIS — J449 Chronic obstructive pulmonary disease, unspecified: Secondary | ICD-10-CM | POA: Diagnosis not present

## 2020-01-23 DIAGNOSIS — E860 Dehydration: Secondary | ICD-10-CM | POA: Diagnosis present

## 2020-01-23 DIAGNOSIS — Z9981 Dependence on supplemental oxygen: Secondary | ICD-10-CM | POA: Diagnosis not present

## 2020-01-23 DIAGNOSIS — K219 Gastro-esophageal reflux disease without esophagitis: Secondary | ICD-10-CM | POA: Diagnosis present

## 2020-01-23 DIAGNOSIS — J441 Chronic obstructive pulmonary disease with (acute) exacerbation: Secondary | ICD-10-CM

## 2020-01-23 DIAGNOSIS — F329 Major depressive disorder, single episode, unspecified: Secondary | ICD-10-CM | POA: Diagnosis present

## 2020-01-23 DIAGNOSIS — G8929 Other chronic pain: Secondary | ICD-10-CM | POA: Diagnosis present

## 2020-01-23 DIAGNOSIS — J42 Unspecified chronic bronchitis: Secondary | ICD-10-CM | POA: Diagnosis not present

## 2020-01-23 DIAGNOSIS — Z800 Family history of malignant neoplasm of digestive organs: Secondary | ICD-10-CM | POA: Diagnosis not present

## 2020-01-23 DIAGNOSIS — M549 Dorsalgia, unspecified: Secondary | ICD-10-CM | POA: Diagnosis present

## 2020-01-23 DIAGNOSIS — I959 Hypotension, unspecified: Secondary | ICD-10-CM | POA: Diagnosis present

## 2020-01-23 DIAGNOSIS — G9341 Metabolic encephalopathy: Secondary | ICD-10-CM | POA: Diagnosis not present

## 2020-01-23 DIAGNOSIS — Z87891 Personal history of nicotine dependence: Secondary | ICD-10-CM | POA: Diagnosis not present

## 2020-01-23 DIAGNOSIS — Z79890 Hormone replacement therapy (postmenopausal): Secondary | ICD-10-CM | POA: Diagnosis not present

## 2020-01-23 DIAGNOSIS — N179 Acute kidney failure, unspecified: Principal | ICD-10-CM

## 2020-01-23 DIAGNOSIS — Z91011 Allergy to milk products: Secondary | ICD-10-CM | POA: Diagnosis not present

## 2020-01-23 DIAGNOSIS — A310 Pulmonary mycobacterial infection: Secondary | ICD-10-CM | POA: Diagnosis not present

## 2020-01-23 DIAGNOSIS — R54 Age-related physical debility: Secondary | ICD-10-CM | POA: Diagnosis present

## 2020-01-23 DIAGNOSIS — N189 Chronic kidney disease, unspecified: Secondary | ICD-10-CM

## 2020-01-23 DIAGNOSIS — F419 Anxiety disorder, unspecified: Secondary | ICD-10-CM | POA: Diagnosis present

## 2020-01-23 DIAGNOSIS — J440 Chronic obstructive pulmonary disease with acute lower respiratory infection: Secondary | ICD-10-CM | POA: Diagnosis present

## 2020-01-23 DIAGNOSIS — Z7951 Long term (current) use of inhaled steroids: Secondary | ICD-10-CM | POA: Diagnosis not present

## 2020-01-23 DIAGNOSIS — E039 Hypothyroidism, unspecified: Secondary | ICD-10-CM | POA: Diagnosis present

## 2020-01-23 DIAGNOSIS — I700 Atherosclerosis of aorta: Secondary | ICD-10-CM | POA: Diagnosis not present

## 2020-01-23 DIAGNOSIS — Z8616 Personal history of COVID-19: Secondary | ICD-10-CM | POA: Diagnosis not present

## 2020-01-23 LAB — CBC WITH DIFFERENTIAL/PLATELET
Abs Immature Granulocytes: 0.09 10*3/uL — ABNORMAL HIGH (ref 0.00–0.07)
Basophils Absolute: 0.1 10*3/uL (ref 0.0–0.1)
Basophils Relative: 1 %
Eosinophils Absolute: 0 10*3/uL (ref 0.0–0.5)
Eosinophils Relative: 0 %
HCT: 36 % (ref 36.0–46.0)
Hemoglobin: 10.2 g/dL — ABNORMAL LOW (ref 12.0–15.0)
Immature Granulocytes: 1 %
Lymphocytes Relative: 11 %
Lymphs Abs: 1.2 10*3/uL (ref 0.7–4.0)
MCH: 25.5 pg — ABNORMAL LOW (ref 26.0–34.0)
MCHC: 28.3 g/dL — ABNORMAL LOW (ref 30.0–36.0)
MCV: 90 fL (ref 80.0–100.0)
Monocytes Absolute: 1 10*3/uL (ref 0.1–1.0)
Monocytes Relative: 9 %
Neutro Abs: 8.7 10*3/uL — ABNORMAL HIGH (ref 1.7–7.7)
Neutrophils Relative %: 78 %
Platelets: 423 10*3/uL — ABNORMAL HIGH (ref 150–400)
RBC: 4 MIL/uL (ref 3.87–5.11)
RDW: 14.4 % (ref 11.5–15.5)
WBC: 11 10*3/uL — ABNORMAL HIGH (ref 4.0–10.5)
nRBC: 0 % (ref 0.0–0.2)

## 2020-01-23 LAB — TROPONIN I (HIGH SENSITIVITY)
Troponin I (High Sensitivity): 29 ng/L — ABNORMAL HIGH (ref <18)
Troponin I (High Sensitivity): 27 ng/L — ABNORMAL HIGH (ref <18)

## 2020-01-23 LAB — BASIC METABOLIC PANEL
Anion gap: 13 (ref 5–15)
BUN: 46 mg/dL — ABNORMAL HIGH (ref 8–23)
CO2: 27 mmol/L (ref 22–32)
Calcium: 8.8 mg/dL — ABNORMAL LOW (ref 8.9–10.3)
Chloride: 97 mmol/L — ABNORMAL LOW (ref 98–111)
Creatinine, Ser: 1.63 mg/dL — ABNORMAL HIGH (ref 0.44–1.00)
GFR calc Af Amer: 36 mL/min — ABNORMAL LOW (ref >60)
GFR calc non Af Amer: 31 mL/min — ABNORMAL LOW (ref >60)
Glucose, Bld: 135 mg/dL — ABNORMAL HIGH (ref 70–99)
Potassium: 3.9 mmol/L (ref 3.5–5.1)
Sodium: 137 mmol/L (ref 135–145)

## 2020-01-23 LAB — D-DIMER, QUANTITATIVE: D-Dimer, Quant: 3.72 ug/mL-FEU — ABNORMAL HIGH (ref 0.00–0.50)

## 2020-01-23 LAB — BRAIN NATRIURETIC PEPTIDE: B Natriuretic Peptide: 237 pg/mL — ABNORMAL HIGH (ref 0.0–100.0)

## 2020-01-23 LAB — SARS CORONAVIRUS 2 BY RT PCR (HOSPITAL ORDER, PERFORMED IN ~~LOC~~ HOSPITAL LAB): SARS Coronavirus 2: NEGATIVE

## 2020-01-23 MED ORDER — UMECLIDINIUM BROMIDE 62.5 MCG/INH IN AEPB
1.0000 | INHALATION_SPRAY | Freq: Every day | RESPIRATORY_TRACT | Status: DC
  Administered 2020-01-23 – 2020-01-27 (×5): 1 via RESPIRATORY_TRACT
  Filled 2020-01-23: qty 7

## 2020-01-23 MED ORDER — ACETAMINOPHEN 650 MG RE SUPP: 650.0000 mg | Freq: Four times a day (QID) | RECTAL | Status: DC | PRN

## 2020-01-23 MED ORDER — SODIUM CHLORIDE 0.9 % IV SOLN: INTRAVENOUS | Status: DC

## 2020-01-23 MED ORDER — PANTOPRAZOLE SODIUM 40 MG PO TBEC
40.0000 mg | DELAYED_RELEASE_TABLET | Freq: Every day | ORAL | Status: DC
  Administered 2020-01-23 – 2020-01-27 (×5): 40 mg via ORAL
  Filled 2020-01-23 (×5): qty 1

## 2020-01-23 MED ORDER — ACETAMINOPHEN 325 MG PO TABS: 650.0000 mg | ORAL_TABLET | Freq: Four times a day (QID) | ORAL | Status: DC | PRN

## 2020-01-23 MED ORDER — ALPRAZOLAM 1 MG PO TABS
1.0000 mg | ORAL_TABLET | Freq: Three times a day (TID) | ORAL | Status: DC | PRN
  Administered 2020-01-24 – 2020-01-25 (×2): 1 mg via ORAL
  Filled 2020-01-23 (×2): qty 1

## 2020-01-23 MED ORDER — SENNOSIDES-DOCUSATE SODIUM 8.6-50 MG PO TABS: 1.0000 | ORAL_TABLET | Freq: Every evening | ORAL | Status: DC | PRN

## 2020-01-23 MED ORDER — FLUTICASONE FUROATE-VILANTEROL 200-25 MCG/INH IN AEPB
1.0000 | INHALATION_SPRAY | Freq: Every day | RESPIRATORY_TRACT | Status: DC
  Administered 2020-01-23 – 2020-01-27 (×5): 1 via RESPIRATORY_TRACT
  Filled 2020-01-23: qty 28

## 2020-01-23 MED ORDER — METHYLPREDNISOLONE SODIUM SUCC 125 MG IJ SOLR
60.0000 mg | Freq: Once | INTRAMUSCULAR | Status: AC
  Administered 2020-01-23: 60 mg via INTRAVENOUS
  Filled 2020-01-23: qty 2

## 2020-01-23 MED ORDER — DOXYCYCLINE HYCLATE 100 MG IV SOLR
200.0000 mg | Freq: Once | INTRAVENOUS | Status: AC
  Administered 2020-01-23: 200 mg via INTRAVENOUS
  Filled 2020-01-23: qty 200

## 2020-01-23 MED ORDER — SODIUM CHLORIDE 0.9 % IV SOLN
2.0000 g | INTRAVENOUS | Status: DC
  Administered 2020-01-23: 2 g via INTRAVENOUS
  Filled 2020-01-23: qty 20

## 2020-01-23 MED ORDER — SODIUM CHLORIDE 0.9 % IV SOLN
500.0000 mg | Freq: Once | INTRAVENOUS | Status: AC
  Administered 2020-01-23: 500 mg via INTRAVENOUS
  Filled 2020-01-23: qty 500

## 2020-01-23 MED ORDER — IPRATROPIUM-ALBUTEROL 0.5-2.5 (3) MG/3ML IN SOLN
3.0000 mL | Freq: Four times a day (QID) | RESPIRATORY_TRACT | Status: DC | PRN
  Administered 2020-01-24: 3 mL via RESPIRATORY_TRACT
  Filled 2020-01-23: qty 3

## 2020-01-23 MED ORDER — SODIUM CHLORIDE 0.9 % IV BOLUS
500.0000 mL | Freq: Once | INTRAVENOUS | Status: AC
  Administered 2020-01-23: 500 mL via INTRAVENOUS

## 2020-01-23 MED ORDER — FLUTICASONE-UMECLIDIN-VILANT 100-62.5-25 MCG/INH IN AEPB
1.0000 | INHALATION_SPRAY | Freq: Every day | RESPIRATORY_TRACT | Status: DC
Start: 2020-01-23 — End: 2020-01-23

## 2020-01-23 MED ORDER — ONDANSETRON HCL 4 MG PO TABS: 4.0000 mg | ORAL_TABLET | Freq: Four times a day (QID) | ORAL | Status: DC | PRN

## 2020-01-23 MED ORDER — HEPARIN SODIUM (PORCINE) 5000 UNIT/ML IJ SOLN
5000.0000 [IU] | Freq: Three times a day (TID) | INTRAMUSCULAR | Status: DC
  Administered 2020-01-23 – 2020-01-27 (×11): 5000 [IU] via SUBCUTANEOUS
  Filled 2020-01-23 (×12): qty 1

## 2020-01-23 MED ORDER — ONDANSETRON HCL 4 MG/2ML IJ SOLN
4.0000 mg | Freq: Four times a day (QID) | INTRAMUSCULAR | Status: DC | PRN
  Administered 2020-01-25: 4 mg via INTRAVENOUS
  Filled 2020-01-23: qty 2

## 2020-01-23 MED ORDER — TECHNETIUM TO 99M ALBUMIN AGGREGATED
4.0000 | Freq: Once | INTRAVENOUS | Status: AC | PRN
  Administered 2020-01-23: 4.4 via INTRAVENOUS

## 2020-01-23 MED ORDER — SODIUM CHLORIDE 0.9 % IV SOLN
100.0000 mg | Freq: Two times a day (BID) | INTRAVENOUS | Status: DC
  Filled 2020-01-23 (×7): qty 100

## 2020-01-23 MED ORDER — SODIUM CHLORIDE 0.9 % IV SOLN
1.0000 g | Freq: Once | INTRAVENOUS | Status: AC
  Administered 2020-01-23: 1 g via INTRAVENOUS
  Filled 2020-01-23: qty 10

## 2020-01-23 MED ORDER — LEVOTHYROXINE SODIUM 50 MCG PO TABS
50.0000 ug | ORAL_TABLET | Freq: Every day | ORAL | Status: DC
  Administered 2020-01-23 – 2020-01-27 (×5): 50 ug via ORAL
  Filled 2020-01-23 (×5): qty 1

## 2020-01-23 NOTE — Assessment & Plan Note (Signed)
Does not appear to be retainer  >>> rec  Adjust to keep sats > 92%  / already set up with home 02

## 2020-01-23 NOTE — Progress Notes (Signed)
RFA Iv infiltrated.  Removed. Attempted to use R wrist IV occluded. No other sites seen. AC to come and attempt to get a site. Assessed by another RN as well, Pt very hesitate stating she had to be stick by 3 different people in the ED. Page MD for notification.Cont with plan of care

## 2020-01-23 NOTE — Progress Notes (Signed)
Patient ID: Marilyn Solomon, female   DOB: Feb 14, 1946, 74 y.o.   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  She was also found to have acute kidney injury and started on IV fluids.  Patient seen and examined at bedside.  I have reviewed patient medical records including this morning's H&P, vitals, labs, medications myself.  I have consulted pulmonary: Follow recommendations.  Repeat a.m. labs.

## 2020-01-23 NOTE — ED Notes (Signed)
Pt stood up from the bed, attempt to walk, pt states "I can't do it" and sat back down on the bed.  Pt's Sats 86-88 during these few minutes.

## 2020-01-23 NOTE — Progress Notes (Signed)
Patient has been stuck for IV by several nurses on previous shift. AC notified. Patient stuck once in LFA and twice in RFA. Unable to obtain Iv access. Patient refusing to be stuck any more. Patient refusing scheduled heparin shot. Dr.Gadhia notified.

## 2020-01-23 NOTE — ED Notes (Signed)
Pt transported to nuc med  

## 2020-01-23 NOTE — H&P (Addendum)
PCP:   Lucianne Lei, MD   Chief Complaint:  Cough  HPI: This is a 74 year old female with history of COPD and chronic respiratory failure on 2 L of oxygen.  She states she has had a cough for the past few days.  It is a wet sounding cough.  She reports a headache that has since resolved.  She states she is not short of breath, she is not wheezing.  She states she is drinking but not eating.  She adds she has no appetite.  She denies any nausea, vomiting or diarrhea.  She did have a little bit of stomach ache but that resolved.  She denies any fevers or chills.  She did not feel well so she decided to come to the ER.  She states she is urinating okay, she states she is not lightheaded or dizzy.    In the ER the patient creatinine is increased.  Baseline creatinine 0.6, current creatinine 1.68.  Chest x-ray shows evidence of chronic changes, unable to rule out indolent infection.  The hospitalist have been asked to admit.  History provided by the patient is alert and oriented.  Review of Systems:  The patient denies anorexia, fever, weight loss,, vision loss, decreased hearing, hoarseness, chest pain, syncope, dyspnea on exertion, peripheral edema, balance deficits, hemoptysis, abdominal pain, melena, hematochezia, severe indigestion/heartburn, hematuria, incontinence, genital sores, muscle weakness, suspicious skin lesions, transient blindness, difficulty walking, depression, unusual weight change, abnormal bleeding, enlarged lymph nodes, angioedema, and breast masses.  Past Medical History: Past Medical History:  Diagnosis Date   Anemia    Anxiety    Arthritis    Chronic back pain    COPD (chronic obstructive pulmonary disease) (HCC)    GERD (gastroesophageal reflux disease)    Hypertension    Hypothyroidism    Reflux    no medications currently, asymptomatic   Thyroid disease    Past Surgical History:  Procedure Laterality Date   ABDOMINAL HYSTERECTOMY     BIOPSY   10/08/2017   Procedure: BIOPSY;  Surgeon: Daneil Dolin, MD;  Location: [facility];  Service: Endoscopy;;  gastric   cataracts Bilateral    COLONOSCOPY  2007   Dr. Gala Romney: normal rectum, diverticula    COLONOSCOPY WITH PROPOFOL N/A 11/15/2015   Dr. Gala Romney: grade II hemorrhoids, diverticulosis   COLONOSCOPY WITH PROPOFOL N/A 10/08/2017   Rourk: Diverticulosis   ESOPHAGOGASTRODUODENOSCOPY (EGD) WITH PROPOFOL N/A 10/08/2017   Rourk: Nonobstructing Schatzki ring, mild erosive reflux esophagitis, small hiatal hernia, gastritis but no H. pylori.   FRACTURE SURGERY Right 2005   Wrist   JOINT REPLACEMENT Left 2000   hip   TOTAL HIP ARTHROPLASTY Left    WRIST SURGERY Right     Medications: Prior to Admission medications   Medication Sig Start Date End Date Taking? Authorizing Provider  albuterol (VENTOLIN HFA) 108 (90 Base) MCG/ACT inhaler Inhale 1-2 puffs into the lungs every 6 (six) hours as needed for wheezing or shortness of breath.    [provider]  ALPRAZolam Duanne Moron) 1 MG tablet Take 1 mg by mouth every 8 (eight) hours as needed for anxiety.  08/10/14   [provider]  dextromethorphan (DELSYM) 30 MG/5ML liquid Take 2.5 mLs (15 mg total) by mouth every 6 (six) hours as needed for cough. Patient taking differently: Take 15 mg by mouth as needed for cough.  10/01/19   Manuella Ghazi, Pratik D, DO  Fluticasone-Umeclidin-Vilant (TRELEGY ELLIPTA) 100-62.5-25 MCG/INH AEPB Inhale 1 puff into the  lungs daily.  12/25/18   Sinda Du, MD  ipratropium-albuterol (DUONEB) 0.5-2.5 (3) MG/3ML SOLN Take 3 mLs by nebulization every 6 (six) hours as needed (shortness of breath or wheezing). Patient not taking: Reported on 11/10/2019 06/04/18 11/07/18  Heath Lark D, DO  levothyroxine (SYNTHROID) 50 MCG tablet Take 50 mcg by mouth daily.  09/11/18   [provider]  oxyCODONE (ROXICODONE) 15 MG immediate release tablet Take 15 mg by mouth 3 (three) times daily as needed for pain.      [provider]  pantoprazole (PROTONIX) 40 MG tablet TAKE 1 TABLET BY MOUTH DAILY. Patient taking differently: Take 40 mg by mouth as needed (for acid reflux).  05/27/18   Annitta Needs, NP  Vitamin D, Ergocalciferol, (DRISDOL) 1.25 MG (50000 UNIT) CAPS capsule Take 50,000 Units by mouth every 7 (seven) days.    [provider]    Allergies:   Allergies  Allergen Reactions   Penicillins Itching        Vicodin [Hydrocodone-Acetaminophen] Nausea And Vomiting    Social History:  reports that she quit smoking about 21 years ago. Her smoking use included cigarettes. She has a 25.00 pack-year smoking history. She has never used smokeless tobacco. She reports that she does not drink alcohol and does not use drugs.  Family History: Family History  Problem Relation Age of Onset   Bone cancer Sister    Pancreatic cancer Sister    Pneumonia Mother    Heart attack Father    Diabetes Sister    Dementia Sister    Colon cancer Neg Hx     Physical Exam: Vitals:   01/22/20 2337 01/23/20 0007 01/23/20 0128 01/23/20 0256  BP:   (!) 100/57 113/65  Pulse:   96 92  Resp:   17 16  Temp:  98.7 F (37.1 C)    TempSrc:  Oral    SpO2: 97%  95% 100%  Weight:      Height:        General:  Alert and oriented times three, well developed and nourished, no acute distress Eyes: PERRLA, pink conjunctiva, no scleral icterus ENT: Moist oral mucosa, neck supple, no thyromegaly Lungs: clear to ascultation, no wheeze, no crackles, no use of accessory muscles Cardiovascular: regular rate and rhythm, no regurgitation, no gallops, no murmurs. No carotid bruits, no JVD Abdomen: soft, positive BS, non-tender, non-distended, no organomegaly, not an acute abdomen GU: not examined Neuro: CN II - XII grossly intact, sensation intact Musculoskeletal: strength 5/5 all extremities, no clubbing, cyanosis or edema Skin: no rash, no subcutaneous crepitation, no decubitus Psych:  appropriate patient   Labs on Admission:  Recent Labs    01/23/20 0004  NA 137  K 3.9  CL 97*  CO2 27  GLUCOSE 135*  BUN 46*  CREATININE 1.63*  CALCIUM 8.8*   No results for input(s): AST, ALT, ALKPHOS, BILITOT, PROT, ALBUMIN in the last 72 hours. No results for input(s): LIPASE, AMYLASE in the last 72 hours. Recent Labs    01/23/20 0004  WBC 11.0*  NEUTROABS 8.7*  HGB 10.2*  HCT 36.0  MCV 90.0  PLT 423*   No results for input(s): CKTOTAL, CKMB, CKMBINDEX, TROPONINI in the last 72 hours. Invalid input(s): POCBNP Recent Labs    01/23/20 0004  DDIMER 3.72*   No results for input(s): HGBA1C in the last 72 hours. No results for input(s): CHOL, HDL, LDLCALC, TRIG, CHOLHDL, LDLDIRECT in the last 72 hours. No results for input(s): TSH,  T4TOTAL, T3FREE, THYROIDAB in the last 72 hours.  Invalid input(s): FREET3 No results for input(s): VITAMINB12, FOLATE, FERRITIN, TIBC, IRON, RETICCTPCT in the last 72 hours.  Micro Results: Recent Results (from the past 240 hour(s))  SARS Coronavirus 2 by RT PCR (hospital order, performed in [facility] hospital lab) Nasopharyngeal Nasopharyngeal Swab     Status: None   Collection Time: 01/22/20 11:00 PM   Specimen: Nasopharyngeal Swab  Result Value Ref Range Status   SARS Coronavirus 2 NEGATIVE NEGATIVE Final    Comment: (NOTE) SARS-CoV-2 target nucleic acids are NOT DETECTED.  The SARS-CoV-2 RNA is generally detectable in upper and lower respiratory specimens during the acute phase of infection. The lowest concentration of SARS-CoV-2 viral copies this assay can detect is 250 copies / mL. A negative result does not preclude SARS-CoV-2 infection and should not be used as the sole basis for treatment or other patient management decisions.  A negative result may occur with improper specimen collection / handling, submission of specimen other than nasopharyngeal swab, presence of viral mutation(s) within the areas targeted by this  assay, and inadequate number of viral copies (<250 copies / mL). A negative result must be combined with clinical observations, patient history, and epidemiological information.  Fact Sheet for Patients:   StrictlyIdeas.no  Fact Sheet for Healthcare Providers: BankingDealers.co.za  This test is not yet approved or  cleared by the [place] FDA and has been authorized for detection and/or diagnosis of SARS-CoV-2 by FDA under an Emergency Use Authorization (EUA).  This EUA will remain in effect (meaning this test can be used) for the duration of the COVID-19 declaration under Section 564(b)(1) of the Act, 21 U.S.C. section 360bbb-3(b)(1), unless the authorization is terminated or revoked sooner.  Performed at [facility], [address], [place], [place] [address]      Radiological Exams on Admission: DG Chest Portable 1 View  Result Date: 01/22/2020 CLINICAL DATA:  Cough EXAM: PORTABLE CHEST 1 VIEW COMPARISON:  10/07/2019, 06/21/2018, 08/12/2018 01/31/2018 chest CT 06/30/2017 FINDINGS: Coarse reticular and slightly nodular opacities throughout the bilateral lungs, chronic to recent priors from 2001, progressive as compared with more remote exams. No acute superimposed confluent airspace disease or effusion. Stable cardiomediastinal silhouette. Aortic atherosclerosis. No pneumothorax. IMPRESSION: Chronic coarse reticular and slightly nodular opacities throughout the bilateral lungs consistent with chronic interstitial disease and possible chronic atypical or indolent infection. This is stable compared with most recent priors but is progressive as compared with more remote exams. No definite acute superimposed confluent airspace disease. Electronically Signed   By: Donavan Foil M.D.   On: 01/22/2020 16:53    CLINICAL DATA:  Cough and persistent abnormal chest x-ray.  EXAM: CT CHEST WITHOUT CONTRAST  TECHNIQUE: Multidetector CT  imaging of the chest was performed following the standard protocol without IV contrast.  COMPARISON:  06/30/2017 and multiple intervening chest x-rays.  FINDINGS: Cardiovascular: Normal heart size. No pericardial effusion. Diffuse atherosclerotic calcification of the aorta and coronaries  Mediastinum/Nodes: Chronic mediastinal and hilar adenopathy which is considered reactive to the pulmonary disease. Small sliding hiatal hernia. Long-standing bulla superior to the left mainstem bronchus.  Lungs/Pleura: Chronic diffuse bronchitis with airway thickening and mucoid impaction/airway collapse at multiple levels. This was seen on a 2013 chest CT. There is peribronchovascular reticular and nodular densities with generalized involvement. Consolidative opacities are seen in the subpleural lower lobes. The pattern is similar to 2019 although progressive. In 2013 these findings were primarily seen at the right upper lobe where  there is some honeycombing. Suspect chronic indolent infection as with mycobacterium. There could be acute pneumonia in this setting. Trace pleural effusions. No air leak. No indication of pulmonary edema.  Upper Abdomen: Negative  Musculoskeletal: No acute or aggressive finding. Exaggerated thoracic kyphosis  IMPRESSION: 1. Long-standing chronic bronchitis with multifocal airway collapse. 2. 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 At the lung bases there is true consolidative opacities and acute on chronic infection is possible.    Assessment/Plan Present on Admission:  Acute kidney injury ([place]) -Bring for 23-hour observation on MedSurg -Gentle IV fluid hydration -Strict I's and O's, daily weights in a.m.   Acute on chronic respiratory failure with hypoxemia (HCC) CT Scan ?mycobacterium -We will rule out TB but more likely Mycobacterium avium.   Recommends a pulmonary consult in a.m. QuantiFERON gold ordered -We will order blood cultures and start antibiotics for possible acute on chronic pneumonia -Oxygen to keep sats greater than 88% -DuoNebs as needed, antitussives as needed -incentive spirometer ordered   COPD (chronic obstructive pulmonary disease) ([place]) -Stable, home meds resumed   Hypothyroidism -Stable, home meds resumed  Donyea Beverlin 01/23/2020, 3:17 AM

## 2020-01-23 NOTE — Consult Note (Signed)
@LOGO @  Marilyn Solomon, female    DOB: [birth year], 74 y.o.   MRN: [id number]   Brief patient profile:  74 yowf quit smokign around 2000 previously pt of Dr Luan Pulling with dx of copd and ? MAI admitted p falling with h/o increasing cough/ congestion and need for 02 and saba x 2-3 d prior to admit, in fact fell over 02 tubing apparently and says that's why she came to er and on eval had CT chest c/w progressive MAI so PCCM consulted pm 8/27     History of Present Illness  01/22/2020  Pulmonary/ 1st office eval/Marilyn Solomon  Chief Complaint  Patient presents with  . Cough  Dyspnea:  MMRC3 = can't walk 100 yards even at a slow pace at a flat grade s stopping due to sob   Cough: congested with yellow mucus worse in am x several days  Sleep: 2 pillows, am congestion  SABA use: increase with both hfa/ neb x 2 days but not at max levels  No obvious day to day or daytime variability or assoc  mucus plugs or hemoptysis or cp or chest tightness, subjective wheeze or overt sinus or hb symptoms.    . Also denies any obvious fluctuation of symptoms with weather or environmental changes or other aggravating or alleviating factors except as outlined above   No unusual exposure hx or h/o childhood pna/ asthma or knowledge of premature birth.  Current Allergies, Complete Past Medical History, Past Surgical History, Family History, and Social History were reviewed in [employer] record.  ROS  The following are not active complaints unless bolded Hoarseness, sore throat, dysphagia, dental problems, itching, sneezing,  nasal congestion or discharge of excess mucus or purulent secretions, ear ache,   fever, chills, sweats, unintended wt loss or wt gain, classically pleuritic or exertional cp,  orthopnea pnd or arm/hand swelling  or leg swelling, presyncope, palpitations, abdominal pain, anorexia, nausea, vomiting, diarrhea  or change in bowel habits or change in bladder habits, change in stools  or change in urine, dysuria, hematuria,  rash, arthralgias, visual complaints, headache, numbness, weakness or ataxia or problems with walking or coordination,  change in mood or  memory.           Past Medical History:  Diagnosis Date  . Anemia   . Anxiety   . Arthritis   . Chronic back pain   . COPD (chronic obstructive pulmonary disease) ([place])   . GERD (gastroesophageal reflux disease)   . Hypertension   . Hypothyroidism   . Reflux    no medications currently, asymptomatic  . Thyroid disease      Medications:        Prior to Admission medications   Medication Sig Start Date End Date Taking? Authorizing Provider  albuterol (VENTOLIN HFA) 108 (90 Base) MCG/ACT inhaler Inhale 1-2 puffs into the lungs every 6 (six) hours as needed for wheezing or shortness of breath.    [provider]  ALPRAZolam Duanne Moron) 1 MG tablet Take 1 mg by mouth every 8 (eight) hours as needed for anxiety.  08/10/14   [provider]  dextromethorphan (DELSYM) 30 MG/5ML liquid Take 2.5 mLs (15 mg total) by mouth every 6 (six) hours as needed for cough. Patient taking differently: Take 15 mg by mouth as needed for cough.  10/01/19   Manuella Ghazi, Pratik D, DO  Fluticasone-Umeclidin-Vilant (TRELEGY ELLIPTA) 100-62.5-25 MCG/INH AEPB Inhale 1 puff into the lungs daily.  12/25/18   Sinda Du, MD  ipratropium-albuterol (DUONEB) 0.5-2.5 (3) MG/3ML SOLN Take 3 mLs by nebulization every 6 (six) hours as needed (shortness of breath or wheezing). Patient not taking: Reported on 11/10/2019 06/04/18 11/07/18  Heath Lark D, DO  levothyroxine (SYNTHROID) 50 MCG tablet Take 50 mcg by mouth daily.  09/11/18   [provider]  oxyCODONE (ROXICODONE) 15 MG immediate release tablet Take 15 mg by mouth 3 (three) times daily as needed for pain.     [provider]  pantoprazole (PROTONIX) 40 MG tablet TAKE 1 TABLET BY MOUTH DAILY. Patient taking differently: Take 40 mg by mouth as needed  (for acid reflux).  05/27/18   Annitta Needs, NP  Vitamin D, Ergocalciferol, (DRISDOL) 1.25 MG (50000 UNIT) CAPS capsule Take 50,000 Units by mouth every 7 (seven) days.    [provider]    Allergies:        Allergies  Allergen Reactions  . Penicillins Itching       . Vicodin [Hydrocodone-Acetaminophen] Nausea And Vomiting      Objective:     BP (!) 116/59 (BP Location: Left Arm)   Pulse 88   Temp (!) 97.5 F (36.4 C) (Oral)   Resp (!) 25   Ht 5\' 5"  (1.651 m)   Wt 51.7 kg   SpO2 98%   BMI 18.97 kg/m   SpO2: 98 % O2 Flow Rate (L/min): 2 L/min   Frail elderly wf sitting up at 60 degrees eating lunch/ rattlin cough on voluntary maneuver   HEENT : pt wearing mask not removed for exam due to covid - 19 concerns.    NECK :  without JVD/Nodes/TM/ nl carotid upstrokes bilaterally   LUNGS: no acc muscle use,  Mild barrel  contour chest wall with bilateral  Distant bs  Much coarser RUL ant  and  without cough on insp or exp maneuvers  and mild  Hyperresonant  to  percussion bilaterally     CV:  RRR  no s3 or murmur or increase in P2, and no edema   ABD:  soft and nontender with pos end  insp Hoover's  in the supine position. No bruits or organomegaly appreciated, bowel sounds nl   NEURO:  alert, approp, nl sensorium with  no obvious motor or cerebellar deficits apparent.       I personally reviewed images and agree with radiology impression as follows:  CXR:   8/27 Chronic coarse reticular and slightly nodular opacities throughout the bilateral lungs consistent with chronic interstitial disease and possible chronic atypical or indolent infection.   progressive as compared with more remote exams. No definite acute superimposed confluent airspace disease  Labs ordered/ reviewed:       Lab Results  Component Value Date   WBC 11.0 (H) 01/23/2020   HGB 10.2 (L) 01/23/2020   HCT 36.0 01/23/2020   MCV 90.0 01/23/2020   PLT 423 (H)  01/23/2020    Lab Results  Component Value Date   NA 137 01/23/2020   K 3.9 01/23/2020   CO2 27 01/23/2020   GLUCOSE 135 (H) 01/23/2020   BUN 46 (H) 01/23/2020   CREATININE 1.63 (H) 01/23/2020   CALCIUM 8.8 (L) 01/23/2020   GFRNONAA 31 (L) 01/23/2020   GFRAA 36 (L) 01/23/2020            Assessment   COPD (chronic obstructive pulmonary disease) ([place]) Quit smoking around 2000, no pfts on file   Apparently  Group D in terms of symptom/risk and laba/lama/ICS  therefore  appropriate rx at this point >>>  Continue trelgy on d/c      MAI (mycobacterium avium-intracellulare) ([place]) chronic lung dz  Classic pattern on cxr serially but no obvious assoc bronchiectasis (suspect we'd see it clearly on HRCT but not need for this study now   >>> rec sputum each am x 3 for afb/fungus and use flutter valve as much as possible  Ok to rx for now as CAP or as acute bronchitis but would send home on a regimen containing zmax or just use levaquin 500 mg x 10 days once we have her sputum collected and refer back to pulmonary clinic p d/c to re-establish with [place] for outpt f/u.      Acute on chronic respiratory failure with hypoxemia ([place]) Does not appear to be retainer  >>> rec  Adjust to keep sats > 92%  / already set up with home 02    F/u as outpt - call as inpt if further input needed   Christinia Gully, MD 01/23/2020

## 2020-01-23 NOTE — ED Notes (Signed)
Report given to RN on 300 

## 2020-01-23 NOTE — Assessment & Plan Note (Signed)
Quit smoking around 2000, no pfts on file   Apparently  Group D in terms of symptom/risk and laba/lama/ICS  therefore appropriate rx at this point >>>  Continue trelgy on d/c

## 2020-01-23 NOTE — Assessment & Plan Note (Signed)
Classic pattern on cxr serially but no obvious assoc bronchiectasis (suspect we'd see it clearly on HRCT but not need for this study now   >>> rec sputum each am x 3 for afb/fungus and use flutter valve as much as possible  Ok to rx for now as CAP or as acute bronchitis but would send home on a regimen containing zmax or just use levaquin 500 mg x 10 days once we have her sputum collected and refer back to pulmonary clinic p d/c to re-establish with [place] for outpt f/u.

## 2020-01-24 DIAGNOSIS — J449 Chronic obstructive pulmonary disease, unspecified: Secondary | ICD-10-CM

## 2020-01-24 DIAGNOSIS — J189 Pneumonia, unspecified organism: Secondary | ICD-10-CM

## 2020-01-24 DIAGNOSIS — E039 Hypothyroidism, unspecified: Secondary | ICD-10-CM

## 2020-01-24 LAB — CBC WITH DIFFERENTIAL/PLATELET
Abs Immature Granulocytes: 0.34 10*3/uL — ABNORMAL HIGH (ref 0.00–0.07)
Basophils Absolute: 0.1 10*3/uL (ref 0.0–0.1)
Basophils Relative: 1 %
Eosinophils Absolute: 0 10*3/uL (ref 0.0–0.5)
Eosinophils Relative: 0 %
HCT: 29.5 % — ABNORMAL LOW (ref 36.0–46.0)
Hemoglobin: 8.2 g/dL — ABNORMAL LOW (ref 12.0–15.0)
Immature Granulocytes: 3 %
Lymphocytes Relative: 9 %
Lymphs Abs: 0.9 10*3/uL (ref 0.7–4.0)
MCH: 25.5 pg — ABNORMAL LOW (ref 26.0–34.0)
MCHC: 27.8 g/dL — ABNORMAL LOW (ref 30.0–36.0)
MCV: 91.6 fL (ref 80.0–100.0)
Monocytes Absolute: 0.4 10*3/uL (ref 0.1–1.0)
Monocytes Relative: 4 %
Neutro Abs: 8.3 10*3/uL — ABNORMAL HIGH (ref 1.7–7.7)
Neutrophils Relative %: 83 %
Platelets: 379 10*3/uL (ref 150–400)
RBC: 3.22 MIL/uL — ABNORMAL LOW (ref 3.87–5.11)
RDW: 14.3 % (ref 11.5–15.5)
WBC: 10.1 10*3/uL (ref 4.0–10.5)
nRBC: 0 % (ref 0.0–0.2)

## 2020-01-24 LAB — BASIC METABOLIC PANEL
Anion gap: 9 (ref 5–15)
BUN: 44 mg/dL — ABNORMAL HIGH (ref 8–23)
CO2: 29 mmol/L (ref 22–32)
Calcium: 8.7 mg/dL — ABNORMAL LOW (ref 8.9–10.3)
Chloride: 102 mmol/L (ref 98–111)
Creatinine, Ser: 0.99 mg/dL (ref 0.44–1.00)
GFR calc Af Amer: >60 mL/min (ref >60)
GFR calc non Af Amer: 57 mL/min — ABNORMAL LOW (ref >60)
Glucose, Bld: 187 mg/dL — ABNORMAL HIGH (ref 70–99)
Potassium: 4.5 mmol/L (ref 3.5–5.1)
Sodium: 140 mmol/L (ref 135–145)

## 2020-01-24 LAB — MAGNESIUM: Magnesium: 2.5 mg/dL — ABNORMAL HIGH (ref 1.7–2.4)

## 2020-01-24 MED ORDER — LEVOFLOXACIN 500 MG PO TABS
500.0000 mg | ORAL_TABLET | Freq: Every day | ORAL | Status: DC
  Administered 2020-01-24 – 2020-01-27 (×4): 500 mg via ORAL
  Filled 2020-01-24 (×4): qty 1

## 2020-01-24 MED ORDER — CEFDINIR 300 MG PO CAPS: 300.0000 mg | ORAL_CAPSULE | Freq: Two times a day (BID) | ORAL | Status: DC

## 2020-01-24 MED ORDER — ENSURE ENLIVE PO LIQD
237.0000 mL | Freq: Two times a day (BID) | ORAL | Status: DC
  Administered 2020-01-25 – 2020-01-26 (×4): 237 mL via ORAL

## 2020-01-24 MED ORDER — ADULT MULTIVITAMIN W/MINERALS CH
1.0000 | ORAL_TABLET | Freq: Every day | ORAL | Status: DC
  Administered 2020-01-24 – 2020-01-27 (×4): 1 via ORAL
  Filled 2020-01-24 (×4): qty 1

## 2020-01-24 MED ORDER — ALUM & MAG HYDROXIDE-SIMETH 200-200-20 MG/5ML PO SUSP
30.0000 mL | ORAL | Status: DC | PRN
  Administered 2020-01-24: 30 mL via ORAL
  Filled 2020-01-24: qty 30

## 2020-01-24 NOTE — TOC Initial Note (Signed)
Transition of Care [facility]) - Initial/Assessment Note    Patient Details  Name: Marilyn Solomon MRN: [id number] Date of Birth: March 28, 1946  Transition of Care [facility]) CM/SW Contact:    Salome Arnt, [place] Phone Number: 01/24/2020, 10:11 AM  Clinical Narrative:  Pt admitted due to acute kidney injury. TOC received consult for home health/DME needs. Pt states she lives with her 2 sons, a daughter, and a granddaughter. She has around the clock assistance if needed. Pt reports her family assists with household tasks and meals, but she is independent with personal care. She ambulates with a walker at baseline. Pt is on 2 liters home O2 through [employer]. Pt denies any DME needs. LCSW discussed d/c plan and pt plans to return home when medically stable. Discussed home health and pt refuses, stating she does not want people coming in to her home due to [place]. MD aware. PT evaluation pending.                    Expected Discharge Plan: Home/Self Care Barriers to Discharge: Continued Medical Work up   Patient Goals and CMS Choice Patient states their goals for this hospitalization and ongoing recovery are:: return home      Expected Discharge Plan and Services Expected Discharge Plan: Home/Self Care In-house Referral: Clinical Social Work     Living arrangements for the past 2 months: Single Family Home                                      Prior Living Arrangements/Services Living arrangements for the past 2 months: Single Family Home Lives with:: Adult Children Patient language and need for interpreter reviewed:: Yes Do you feel safe going back to the place where you live?: Yes      Need for Family Participation in Patient Care: Yes (Comment) Care giver support system in place?: Yes (comment) Current home services: DME (walker, 3N1, 2L O2 through [employer]) Criminal Activity/Legal Involvement Pertinent to Current Situation/Hospitalization: No - Comment as needed  Activities  of Daily Living Home Assistive Devices/Equipment: Oxygen, Walker (specify type) ADL Screening (condition at time of admission) Patient's cognitive ability adequate to safely complete daily activities?: No Is the patient deaf or have difficulty hearing?: Yes Does the patient have difficulty seeing, even when wearing glasses/contacts?: No Does the patient have difficulty concentrating, remembering, or making decisions?: No Patient able to express need for assistance with ADLs?: No Does the patient have difficulty dressing or bathing?: No Independently performs ADLs?: Yes (appropriate for developmental age) Does the patient have difficulty walking or climbing stairs?: Yes Weakness of Legs: None Weakness of Arms/Hands: None  Permission Sought/Granted                  Emotional Assessment Appearance:: Appears stated age Attitude/Demeanor/Rapport: Engaged Affect (typically observed): Appropriate Orientation: : Oriented to Self, Oriented to Place, Oriented to  Time, Oriented to Situation Alcohol / Substance Use: Not Applicable Psych Involvement: No (comment)  Admission diagnosis:  Dehydration [E86.0] AKI (acute kidney injury) ([place]) [N17.9] Acute kidney injury ([place]) [N17.9] Community acquired pneumonia, unspecified laterality [J18.9] Patient Active Problem List   Diagnosis Date Noted  . Acute kidney injury superimposed on CKD ([place]) 01/23/2020  . Acute kidney injury ([place]) 01/23/2020  . AKI (acute kidney injury) ([place]) 01/23/2020  . MAI (mycobacterium avium-intracellulare) ([place]) chronic lung dz  01/23/2020  . Heme positive stool   .  Acute gastroenteritis 10/08/2019  . Acute respiratory distress 10/08/2019  . C. difficile diarrhea 10/08/2019  . Sepsis due to undetermined organism ([place]) 10/07/2019  . Pneumonia due to COVID-19 virus 09/28/2019  . Hypokalemia 11/07/2018  . Hyperglycemia 11/07/2018  . Acute on chronic respiratory failure with hypoxemia ([place]) 06/21/2018  . COPD  exacerbation ([place]) 06/02/2018  . Anemia 06/02/2018  . Hypertension 06/02/2018  . COPD with acute exacerbation ([place]) 01/31/2018  . Tachycardia 01/31/2018  . Hypoxia 01/31/2018  . Leukocytosis 01/31/2018  . Positive colorectal cancer screening using Cologuard test 08/21/2017  . Normocytic anemia   . Anxiety 06/30/2017  . Hypothyroidism 06/30/2017  . GERD (gastroesophageal reflux disease) 06/30/2017  . COPD (chronic obstructive pulmonary disease) ([place]) 06/30/2017  . Elevated liver enzymes 06/30/2017  . Severe Iron deficiency anemia 06/30/2017  . HCAP (healthcare-associated pneumonia) 06/28/2017  . CAP (community acquired pneumonia) 06/28/2017  . Second degree hemorrhoids   . Encounter for screening colonoscopy 10/28/2015   PCP:  Lucianne Lei, MD Pharmacy:   [facility], [place] [facility] [place] [address] Phone: [phone] Fax: [phone]     Social Determinants of Health (SDOH) Interventions    Readmission Risk Interventions No flowsheet data found.

## 2020-01-24 NOTE — Progress Notes (Signed)
Patient ID: Marilyn Solomon, female   DOB: June 28, 1945, 74 y.o.   MRN: [id number]  PROGRESS NOTE    Marilyn Solomon  DJS:[id number] DOB: 08-Feb-1946 DOA: 01/22/2020 PCP: Lucianne Lei, MD   Brief Narrative:  74 year old female with COPD and chronic hypoxic respiratory failure failure on 2 L oxygen,?  MAI, hypothyroidism presented with worsening shortness of breath and cough.  On presentation, creatinine was 1.63, baseline creatinine 0.6.  Chest x-ray showed evidence of chronic changes, unable to rule out low-grade infection.  CT of the chest showed long-standing chronic bronchitis with multifocal airway collapse.   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 At the lung bases there is true consolidative opacities and acute on chronic infection is possible.  She was started on antibiotics.  Pulmonary was consulted.  Assessment & Plan:    Acute kidney injury -Probably from dehydration and poor oral intake.  Creatinine 1.63 on presentation, baseline creatinine 0.6.  Treated with IV fluids.  Creatinine has improved to 0.99. -DC IV fluids.  Encourage oral intake  Acute on chronic hypoxic respiratory failure Probable community-acquired bacterial pneumonia Chronic lung disease/?  MAI -Started on Rocephin and Zithromax.  Has lost IV access will.  We will switch to Levaquin 500 mg daily as per pulmonary recommendations. -Pulmonary recommends AFB x3 sputum testing and subsequently outpatient follow-up -Currently requiring 4 L oxygen via nasal cannula.  Wean off to 2 L oxygen as able.  Incentive spirometry.  Flutter valve  COPD -Continue current treatment.  Outpatient follow-up with pulmonary  Hypothyroidism-continue levothyroxine   DVT prophylaxis: Heparin Code Status: Full Family Communication: Patient at bedside Disposition Plan: Status is: Inpatient  Remains inpatient appropriate  because:Inpatient level of care appropriate due to severity of illness   Dispo: The patient is from: Home              Anticipated d/c is to: Home              Anticipated d/c date is: 2 days              Patient currently is medically stable to d/c.  Will need AFB sputum x3 - prior to discharge  Consultants: Pulmonary  Procedures: None  Antimicrobials:  Anti-infectives (From admission, onward)   Start     Dose/Rate Route Frequency Ordered Stop   01/24/20 1000  cefdinir (OMNICEF) capsule 300 mg        300 mg Oral Every 12 hours 01/24/20 0756     01/23/20 2200  doxycycline (VIBRAMYCIN) 100 mg in sodium chloride 0.9 % 250 mL IVPB  Status:  Discontinued       "Followed by" Linked Group Details   100 mg 125 mL/hr over 120 Minutes Intravenous Every 12 hours 01/23/20 0558 01/24/20 0755   01/23/20 1200  cefTRIAXone (ROCEPHIN) 2 g in sodium chloride 0.9 % 100 mL IVPB  Status:  Discontinued        2 g 200 mL/hr over 30 Minutes Intravenous Every 24 hours 01/23/20 0558 01/24/20 0755   01/23/20 1000  doxycycline (VIBRAMYCIN) 200 mg in dextrose 5 % 250 mL IVPB       "Followed by" Linked Group Details   200 mg 125 mL/hr over 120 Minutes Intravenous  Once 01/23/20 0558 01/23/20 1203   01/23/20 0245  azithromycin (ZITHROMAX) 500 mg in sodium chloride 0.9 % 250 mL IVPB        500 mg 250 mL/hr over 60  Minutes Intravenous  Once 01/23/20 0233 01/23/20 0639   01/23/20 0245  cefTRIAXone (ROCEPHIN) 1 g in sodium chloride 0.9 % 100 mL IVPB        1 g 200 mL/hr over 30 Minutes Intravenous  Once 01/23/20 0233 01/23/20 0343       Subjective: Patient seen and examined at bedside.  Still has cough.  Feels slightly better.  No overnight fever, vomiting or worsening shortness of breath.  Objective: Vitals:   01/23/20 1700 01/23/20 2128 01/24/20 0407 01/24/20 0847  BP: 114/62 (!) 117/55 127/66   Pulse: 89 91 83   Resp: 20 16 16    Temp: 98 F (36.7 C) 97.9 F (36.6 C) 97.9 F (36.6 C)   TempSrc:  Oral Oral Oral   SpO2: 100% 100% 100% 95%  Weight:      Height:        Intake/Output Summary (Last 24 hours) at 01/24/2020 0929 Last data filed at 01/23/2020 2200 Gross per 24 hour  Intake 499.67 ml  Output --  Net 499.67 ml   Filed Weights   01/22/20 1611  Weight: 51.7 kg    Examination:  General exam: Appears calm and comfortable.  Looks chronically ill.  Poor historian.  Looks very thinly built currently on 4 L oxygen via nasal cannula Respiratory system: Bilateral decreased breath sounds at bases with scattered crackles Cardiovascular system: S1 & S2 heard, Rate controlled Gastrointestinal system: Abdomen is nondistended, soft and nontender. Normal bowel sounds heard. Extremities: No cyanosis, clubbing, edema  Central nervous system: Sleepy, wakes up slightly, answers only few questions.  Poor historian no focal neurological deficits. Moving extremities Skin: No rashes, lesions or ulcers Psychiatry: Flat affect when she wakes up    Data Reviewed: I have personally reviewed following labs and imaging studies  CBC: Recent Labs  Lab 01/23/20 0004 01/24/20 0555  WBC 11.0* 10.1  NEUTROABS 8.7* 8.3*  HGB 10.2* 8.2*  HCT 36.0 29.5*  MCV 90.0 91.6  PLT 423* 272   Basic Metabolic Panel: Recent Labs  Lab 01/23/20 0004 01/24/20 0555  NA 137 140  K 3.9 4.5  CL 97* 102  CO2 27 29  GLUCOSE 135* 187*  BUN 46* 44*  CREATININE 1.63* 0.99  CALCIUM 8.8* 8.7*  MG  --  2.5*   GFR: Estimated Creatinine Clearance: 41.3 mL/min (by C-G formula based on SCr of 0.99 mg/dL). Liver Function Tests: No results for input(s): AST, ALT, ALKPHOS, BILITOT, PROT, ALBUMIN in the last 168 hours. No results for input(s): LIPASE, AMYLASE in the last 168 hours. No results for input(s): AMMONIA in the last 168 hours. Coagulation Profile: No results for input(s): INR, PROTIME in the last 168 hours. Cardiac Enzymes: No results for input(s): CKTOTAL, CKMB, CKMBINDEX, TROPONINI in the last  168 hours. BNP (last 3 results) No results for input(s): PROBNP in the last 8760 hours. HbA1C: No results for input(s): HGBA1C in the last 72 hours. CBG: No results for input(s): GLUCAP in the last 168 hours. Lipid Profile: No results for input(s): CHOL, HDL, LDLCALC, TRIG, CHOLHDL, LDLDIRECT in the last 72 hours. Thyroid Function Tests: No results for input(s): TSH, T4TOTAL, FREET4, T3FREE, THYROIDAB in the last 72 hours. Anemia Panel: No results for input(s): VITAMINB12, FOLATE, FERRITIN, TIBC, IRON, RETICCTPCT in the last 72 hours. Sepsis Labs: No results for input(s): PROCALCITON, LATICACIDVEN in the last 168 hours.  Recent Results (from the past 240 hour(s))  SARS Coronavirus 2 by RT PCR (hospital order, performed in [facility] hospital  lab) Nasopharyngeal Nasopharyngeal Swab     Status: None   Collection Time: 01/22/20 11:00 PM   Specimen: Nasopharyngeal Swab  Result Value Ref Range Status   SARS Coronavirus 2 NEGATIVE NEGATIVE Final    Comment: (NOTE) SARS-CoV-2 target nucleic acids are NOT DETECTED.  The SARS-CoV-2 RNA is generally detectable in upper and lower respiratory specimens during the acute phase of infection. The lowest concentration of SARS-CoV-2 viral copies this assay can detect is 250 copies / mL. A negative result does not preclude SARS-CoV-2 infection and should not be used as the sole basis for treatment or other patient management decisions.  A negative result may occur with improper specimen collection / handling, submission of specimen other than nasopharyngeal swab, presence of viral mutation(s) within the areas targeted by this assay, and inadequate number of viral copies (<250 copies / mL). A negative result must be combined with clinical observations, patient history, and epidemiological information.  Fact Sheet for Patients:   StrictlyIdeas.no  Fact Sheet for Healthcare  Providers: BankingDealers.co.za  This test is not yet approved or  cleared by the [place] FDA and has been authorized for detection and/or diagnosis of SARS-CoV-2 by FDA under an Emergency Use Authorization (EUA).  This EUA will remain in effect (meaning this test can be used) for the duration of the COVID-19 declaration under Section 564(b)(1) of the Act, 21 U.S.C. section 360bbb-3(b)(1), unless the authorization is terminated or revoked sooner.  Performed at [facility], [address] [place], [place] [address]   Culture, blood (Routine X 2) w Reflex to ID Panel     Status: None (Preliminary result)   Collection Time: 01/23/20  6:50 AM   Specimen: BLOOD  Result Value Ref Range Status   Specimen Description BLOOD RIGHT ANTECUBITAL  Final   Special Requests   Final    BOTTLES DRAWN AEROBIC AND ANAEROBIC Blood Culture adequate volume Performed at [facility], [address] [place] [address], [place], [place] [address]    Culture PENDING  Incomplete   Report Status PENDING  Incomplete  Culture, blood (Routine X 2) w Reflex to ID Panel     Status: None (Preliminary result)   Collection Time: 01/23/20  6:57 AM   Specimen: BLOOD  Result Value Ref Range Status   Specimen Description BLOOD RIGHT ANTECUBITAL  Final   Special Requests   Final    BOTTLES DRAWN AEROBIC AND ANAEROBIC Blood Culture adequate volume Performed at [facility], [address], [place],  [address]    Culture PENDING  Incomplete   Report Status PENDING  Incomplete         Radiology Studies: CT CHEST WO CONTRAST  Result Date: 01/23/2020 CLINICAL DATA:  Cough and persistent abnormal chest x-ray. EXAM: CT CHEST WITHOUT CONTRAST TECHNIQUE: Multidetector CT imaging of the chest was performed following the standard protocol without IV contrast. COMPARISON:  06/30/2017 and multiple intervening chest x-rays. FINDINGS: Cardiovascular: Normal heart size. No pericardial effusion. Diffuse  atherosclerotic calcification of the aorta and coronaries Mediastinum/Nodes: Chronic mediastinal and hilar adenopathy which is considered reactive to the pulmonary disease. Small sliding hiatal hernia. Long-standing bulla superior to the left mainstem bronchus. Lungs/Pleura: Chronic diffuse bronchitis with airway thickening and mucoid impaction/airway collapse at multiple levels. This was seen on a 2013 chest CT. There is peribronchovascular reticular and nodular densities with generalized involvement. Consolidative opacities are seen in the subpleural lower lobes. The pattern is similar to 2019 although progressive. In 2013 these findings were primarily seen at the right upper  lobe where there is some honeycombing. Suspect chronic indolent infection as with mycobacterium. There could be acute pneumonia in this setting. Trace pleural effusions. No air leak. No indication of pulmonary edema. Upper Abdomen: Negative Musculoskeletal: No acute or aggressive finding. Exaggerated thoracic kyphosis IMPRESSION: 1. Long-standing chronic bronchitis with multifocal airway collapse. 2. 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 At the lung bases there is true consolidative opacities and acute on chronic infection is possible. Aortic Atherosclerosis (ICD10-I70.0).  Coronary atherosclerosis. Electronically Signed   By: Monte Fantasia M.D.   On: 01/23/2020 05:01   NM Pulmonary Perfusion  Result Date: 01/23/2020 CLINICAL DATA:  Evaluate for pulmonary embolus. EXAM: NUCLEAR MEDICINE PERFUSION LUNG SCAN TECHNIQUE: Perfusion images were obtained in multiple projections after intravenous injection of radiopharmaceutical. Ventilation scans intentionally deferred if perfusion scan and chest x-ray adequate for interpretation during COVID 19 epidemic. RADIOPHARMACEUTICALS:  4.4 mCi Tc-74m MAA IV COMPARISON:  Chest CT from 01/23/2020 and  chest radiograph 01/22/2020 FINDINGS: Review of chest CT shows bilateral, multifocal pulmonary densities involving both the upper and lower lung zones. The airspace consolidation is noted in both lung bases and there is a small left pleural effusion. On the perfusion images there is regional decreased radiotracer within the lower lung zones bilaterally. More focal areas of decreased perfusion are identified within the right apex, anterior right upper lobe, superior segment of right lower lobe and anterior left upper lobe. IMPRESSION: 1. Findings are indeterminate. Multiple perfusion abnormalities are identified. However, In the setting of abnormal chest radiograph showing multiple bilateral pulmonary opacities findings are nonspecific and not diagnostic for the presence or absence of pulmonary embolus. If there is a continued clinical concern for pulmonary embolus further investigation with lower extremity Doppler and or CTA of the chest is recommended. Electronically Signed   By: Kerby Moors M.D.   On: 01/23/2020 08:52   DG Chest Portable 1 View  Result Date: 01/22/2020 CLINICAL DATA:  Cough EXAM: PORTABLE CHEST 1 VIEW COMPARISON:  10/07/2019, 06/21/2018, 08/12/2018 01/31/2018 chest CT 06/30/2017 FINDINGS: Coarse reticular and slightly nodular opacities throughout the bilateral lungs, chronic to recent priors from 2001, progressive as compared with more remote exams. No acute superimposed confluent airspace disease or effusion. Stable cardiomediastinal silhouette. Aortic atherosclerosis. No pneumothorax. IMPRESSION: Chronic coarse reticular and slightly nodular opacities throughout the bilateral lungs consistent with chronic interstitial disease and possible chronic atypical or indolent infection. This is stable compared with most recent priors but is progressive as compared with more remote exams. No definite acute superimposed confluent airspace disease. Electronically Signed   By: Donavan Foil M.D.   On:  01/22/2020 16:53        Scheduled Meds: . cefdinir  300 mg Oral Q12H  . fluticasone furoate-vilanterol  1 puff Inhalation Daily  . heparin  5,000 Units Subcutaneous Q8H  . levothyroxine  50 mcg Oral Daily  . pantoprazole  40 mg Oral Daily  . umeclidinium bromide  1 puff Inhalation Daily   Continuous Infusions:        Aline August, MD [facility] 01/24/2020, 9:29 AM

## 2020-01-24 NOTE — Progress Notes (Signed)
Initial Nutrition Assessment  DOCUMENTATION CODES:   Not applicable  INTERVENTION:    Ensure Enlive po BID, each supplement provides 350 kcal and 20 grams of protein  MVI daily   NUTRITION DIAGNOSIS:   Increased nutrient needs related to acute illness as evidenced by estimated needs.  GOAL:   Patient will meet greater than or equal to 90% of their needs  MONITOR:   PO intake, Supplement acceptance, Weight trends, Labs, I & O's  REASON FOR ASSESSMENT:   Consult Assessment of nutrition requirement/status  ASSESSMENT:   Patient with PMH significant for COPD, MAI, and hypothyroidism. Presents this admission with AKI and possible community acquired bacterial PNA.   Pt hard of hearing. Seemed slightly confused? Denies loss in appetite PTA. Typically consumes two meals daily that consist of bologna sandwiches, tacos, and chicken. Does not use supplementation. Appetite slowly progressing per reports. No meal completions documented. RD to send Ensure to maximize kcal and protein this admission.   Pt reports UBW of 117 lb and denies recent wt loss. Records indicate pt weighed 115 lb on 5/12 and show a stated weight of 114 lb this admission. Will need to obtain actual weight to assess for weight loss.   Medications: reviewed  Labs: Mg 2.5 (H)   Diet Order:   Diet Order            Diet regular Room service appropriate? Yes; Fluid consistency: Thin  Diet effective now                 EDUCATION NEEDS:   Education needs have been addressed  Skin:  Skin Assessment: Skin Integrity Issues: Skin Integrity Issues:: Other (Comment) Other: MASD-sacrum  Last BM:  8/26  Height:   Ht Readings from Last 1 Encounters:  01/22/20 5\' 5"  (1.651 m)    Weight:   Wt Readings from Last 1 Encounters:  01/22/20 51.7 kg    BMI:  Body mass index is 18.97 kg/m.  Estimated Nutritional Needs:   Kcal:  1550-1750 kcal  Protein:  80-95 grams  Fluid:  >/= 1.5 L/day   Mariana Single RD, LDN Clinical Nutrition Pager listed in [place]

## 2020-01-25 ENCOUNTER — Inpatient Hospital Stay (HOSPITAL_COMMUNITY): Payer: Medicare Other

## 2020-01-25 LAB — QUANTIFERON-TB GOLD PLUS (RQFGPL)
QuantiFERON Mitogen Value: 0.07 IU/mL
QuantiFERON Nil Value: 0 IU/mL
QuantiFERON TB1 Ag Value: 0 IU/mL
QuantiFERON TB2 Ag Value: 0 IU/mL

## 2020-01-25 LAB — GLUCOSE, CAPILLARY: Glucose-Capillary: 101 mg/dL — ABNORMAL HIGH (ref 70–99)

## 2020-01-25 LAB — QUANTIFERON-TB GOLD PLUS: QuantiFERON-TB Gold Plus: UNDETERMINED — AB

## 2020-01-25 MED ORDER — DEXTROSE-NACL 5-0.9 % IV SOLN: INTRAVENOUS | Status: DC

## 2020-01-25 MED ORDER — ALPRAZOLAM 0.25 MG PO TABS
0.2500 mg | ORAL_TABLET | Freq: Three times a day (TID) | ORAL | Status: DC | PRN
  Administered 2020-01-26 – 2020-01-27 (×3): 0.25 mg via ORAL
  Filled 2020-01-25 (×3): qty 1

## 2020-01-25 MED ORDER — HALOPERIDOL LACTATE 5 MG/ML IJ SOLN: 1.0000 mg | Freq: Four times a day (QID) | INTRAMUSCULAR | Status: DC | PRN

## 2020-01-25 MED ORDER — HALOPERIDOL LACTATE 5 MG/ML IJ SOLN
2.5000 mg | Freq: Once | INTRAMUSCULAR | Status: AC
  Administered 2020-01-25: 2.5 mg via INTRAVENOUS
  Filled 2020-01-25: qty 1

## 2020-01-25 NOTE — Progress Notes (Signed)
Assessing pt and noticing mental status change from earlier today. Pt is less alert, responding only to voice, orientedx4, pupils reactive, CBG of 101, vital signs stable. Pt denies any symptoms, states she "just feels tired". MD notified. New orders for fluids and head CT. Will begin fluids now and will continue to monitor patient.

## 2020-01-25 NOTE — Progress Notes (Signed)
Patient ID: Marilyn Solomon, female   DOB: 03-Mar-1946, 74 y.o.   MRN: [id number]  PROGRESS NOTE    Marilyn Solomon  ZES:[id number] DOB: Jul 28, 1945 DOA: 01/22/2020 PCP: Lucianne Lei, MD   Brief Narrative:  75 year old female with COPD and chronic hypoxic respiratory failure failure on 2 L oxygen,?  MAI, hypothyroidism presented with worsening shortness of breath and cough.  On presentation, creatinine was 1.63, baseline creatinine 0.6.  Chest x-ray showed evidence of chronic changes, unable to rule out low-grade infection.  CT of the chest showed long-standing chronic bronchitis with multifocal airway collapse.   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 At the lung bases there is true consolidative opacities and acute on chronic infection is possible.  She was started on antibiotics.  Pulmonary was consulted.  Assessment & Plan:    Acute kidney injury -Probably from dehydration and poor oral intake.  Creatinine 1.63 on presentation, baseline creatinine 0.6.  Treated with IV fluids.  Creatinine had improved to 0.99.  No labs today.  Repeat a.m. labs. -Off IV fluids.  Acute on chronic hypoxic respiratory failure Probable community-acquired bacterial pneumonia Chronic lung disease/?  MAI -Started on Rocephin and Zithromax.  Subsequently switched to Levaquin 500 mg daily as per pulmonary recommendations. -Pulmonary recommends AFB x3 sputum testing and subsequently outpatient follow-up -Currently requiring 2 L oxygen via nasal cannula.  Incentive spirometry.  Flutter valve  COPD -Continue current treatment.  Outpatient follow-up with pulmonary  Hypothyroidism-continue levothyroxine  Generalized deconditioning -PT eval pending.  Patient is refusing home health because of Covid pandemic   DVT prophylaxis: Heparin Code Status: Full Family Communication: Patient at bedside Disposition  Plan: Status is: Inpatient  Remains inpatient appropriate because:Inpatient level of care appropriate due to severity of illness   Dispo: The patient is from: Home              Anticipated d/c is to: Home              Anticipated d/c date is: 2 days              Patient currently is medically stable to d/c.  Will need AFB sputum x3 negative prior to discharge  Consultants: Pulmonary  Procedures: None  Antimicrobials:  Anti-infectives (From admission, onward)   Start     Dose/Rate Route Frequency Ordered Stop   01/24/20 1000  cefdinir (OMNICEF) capsule 300 mg  Status:  Discontinued        300 mg Oral Every 12 hours 01/24/20 0756 01/24/20 0943   01/24/20 1000  levofloxacin (LEVAQUIN) tablet 500 mg        500 mg Oral Daily 01/24/20 0943     01/23/20 2200  doxycycline (VIBRAMYCIN) 100 mg in sodium chloride 0.9 % 250 mL IVPB  Status:  Discontinued       "Followed by" Linked Group Details   100 mg 125 mL/hr over 120 Minutes Intravenous Every 12 hours 01/23/20 0558 01/24/20 0755   01/23/20 1200  cefTRIAXone (ROCEPHIN) 2 g in sodium chloride 0.9 % 100 mL IVPB  Status:  Discontinued        2 g 200 mL/hr over 30 Minutes Intravenous Every 24 hours 01/23/20 0558 01/24/20 0755   01/23/20 1000  doxycycline (VIBRAMYCIN) 200 mg in dextrose 5 % 250 mL IVPB       "Followed by" Linked Group Details   200 mg 125 mL/hr over 120 Minutes Intravenous  Once 01/23/20  9937 01/23/20 1203   01/23/20 0245  azithromycin (ZITHROMAX) 500 mg in sodium chloride 0.9 % 250 mL IVPB        500 mg 250 mL/hr over 60 Minutes Intravenous  Once 01/23/20 0233 01/23/20 0639   01/23/20 0245  cefTRIAXone (ROCEPHIN) 1 g in sodium chloride 0.9 % 100 mL IVPB        1 g 200 mL/hr over 30 Minutes Intravenous  Once 01/23/20 0233 01/23/20 0343       Subjective: Patient seen and examined at bedside.  Does not feel well.  Feels anxious.  Wants to go home.  Cough is improving slightly.  No overnight fever  reported Objective: Vitals:   01/24/20 1958 01/25/20 0059 01/25/20 0610 01/25/20 0906  BP:  130/73 (!) 123/59   Pulse: 84 77 84   Resp: 20 18 18    Temp:  97.8 F (36.6 C) 98.1 F (36.7 C)   TempSrc:  Oral    SpO2: 97% 100% 100% 98%  Weight:      Height:       No intake or output data in the 24 hours ending 01/25/20 0928 Filed Weights   01/22/20 1611  Weight: 51.7 kg    Examination:  General exam: No acute distress.  Looks chronically ill.  Poor historian.  Looks very thinly built; currently on 2 L oxygen via nasal cannula  respiratory system: Bilateral decreased breath sounds at bases with some crackles, no wheezing Cardiovascular system: Rate controlled, S1-S2 heard Gastrointestinal system: Abdomen is nondistended, soft and nontender.  Bowel sounds are heard  extremities: No edema or cyanosis    Data Reviewed: I have personally reviewed following labs and imaging studies  CBC: Recent Labs  Lab 01/23/20 0004 01/24/20 0555  WBC 11.0* 10.1  NEUTROABS 8.7* 8.3*  HGB 10.2* 8.2*  HCT 36.0 29.5*  MCV 90.0 91.6  PLT 423* 169   Basic Metabolic Panel: Recent Labs  Lab 01/23/20 0004 01/24/20 0555  NA 137 140  K 3.9 4.5  CL 97* 102  CO2 27 29  GLUCOSE 135* 187*  BUN 46* 44*  CREATININE 1.63* 0.99  CALCIUM 8.8* 8.7*  MG  --  2.5*   GFR: Estimated Creatinine Clearance: 41.3 mL/min (by C-G formula based on SCr of 0.99 mg/dL). Liver Function Tests: No results for input(s): AST, ALT, ALKPHOS, BILITOT, PROT, ALBUMIN in the last 168 hours. No results for input(s): LIPASE, AMYLASE in the last 168 hours. No results for input(s): AMMONIA in the last 168 hours. Coagulation Profile: No results for input(s): INR, PROTIME in the last 168 hours. Cardiac Enzymes: No results for input(s): CKTOTAL, CKMB, CKMBINDEX, TROPONINI in the last 168 hours. BNP (last 3 results) No results for input(s): PROBNP in the last 8760 hours. HbA1C: No results for input(s): HGBA1C in the last  72 hours. CBG: No results for input(s): GLUCAP in the last 168 hours. Lipid Profile: No results for input(s): CHOL, HDL, LDLCALC, TRIG, CHOLHDL, LDLDIRECT in the last 72 hours. Thyroid Function Tests: No results for input(s): TSH, T4TOTAL, FREET4, T3FREE, THYROIDAB in the last 72 hours. Anemia Panel: No results for input(s): VITAMINB12, FOLATE, FERRITIN, TIBC, IRON, RETICCTPCT in the last 72 hours. Sepsis Labs: No results for input(s): PROCALCITON, LATICACIDVEN in the last 168 hours.  Recent Results (from the past 240 hour(s))  SARS Coronavirus 2 by RT PCR (hospital order, performed in [facility] hospital lab) Nasopharyngeal Nasopharyngeal Swab     Status: None   Collection Time: 01/22/20 11:00 PM  Specimen: Nasopharyngeal Swab  Result Value Ref Range Status   SARS Coronavirus 2 NEGATIVE NEGATIVE Final    Comment: (NOTE) SARS-CoV-2 target nucleic acids are NOT DETECTED.  The SARS-CoV-2 RNA is generally detectable in upper and lower respiratory specimens during the acute phase of infection. The lowest concentration of SARS-CoV-2 viral copies this assay can detect is 250 copies / mL. A negative result does not preclude SARS-CoV-2 infection and should not be used as the sole basis for treatment or other patient management decisions.  A negative result may occur with improper specimen collection / handling, submission of specimen other than nasopharyngeal swab, presence of viral mutation(s) within the areas targeted by this assay, and inadequate number of viral copies (<250 copies / mL). A negative result must be combined with clinical observations, patient history, and epidemiological information.  Fact Sheet for Patients:   StrictlyIdeas.no  Fact Sheet for Healthcare Providers: BankingDealers.co.za  This test is not yet approved or  cleared by the [place] FDA and has been authorized for detection and/or diagnosis of  SARS-CoV-2 by FDA under an Emergency Use Authorization (EUA).  This EUA will remain in effect (meaning this test can be used) for the duration of the COVID-19 declaration under Section 564(b)(1) of the Act, 21 U.S.C. section 360bbb-3(b)(1), unless the authorization is terminated or revoked sooner.  Performed at [facility], [address], [place], [place] [address]   Culture, blood (Routine X 2) w Reflex to ID Panel     Status: None (Preliminary result)   Collection Time: 01/23/20  6:50 AM   Specimen: BLOOD  Result Value Ref Range Status   Specimen Description BLOOD RIGHT ANTECUBITAL  Final   Special Requests   Final    BOTTLES DRAWN AEROBIC AND ANAEROBIC Blood Culture adequate volume   Culture   Final    NO GROWTH 1 DAY Performed at [facility], [address] [place], [place] [address]    Report Status PENDING  Incomplete  Culture, blood (Routine X 2) w Reflex to ID Panel     Status: None (Preliminary result)   Collection Time: 01/23/20  6:57 AM   Specimen: BLOOD  Result Value Ref Range Status   Specimen Description BLOOD RIGHT ANTECUBITAL  Final   Special Requests   Final    BOTTLES DRAWN AEROBIC AND ANAEROBIC Blood Culture adequate volume   Culture   Final    NO GROWTH 1 DAY Performed at [facility], [address], [place], [place] [address]    Report Status PENDING  Incomplete         Radiology Studies: No results found.      Scheduled Meds: . feeding supplement (ENSURE ENLIVE)  237 mL Oral BID BM  . fluticasone furoate-vilanterol  1 puff Inhalation Daily  . heparin  5,000 Units Subcutaneous Q8H  . levofloxacin  500 mg Oral Daily  . levothyroxine  50 mcg Oral Daily  . multivitamin with minerals  1 tablet Oral Daily  . pantoprazole  40 mg Oral Daily  . umeclidinium bromide  1 puff Inhalation Daily   Continuous Infusions:        Aline August, MD [facility] 01/25/2020, 9:28 AM

## 2020-01-26 LAB — CBC WITH DIFFERENTIAL/PLATELET
Abs Immature Granulocytes: 0.53 10*3/uL — ABNORMAL HIGH (ref 0.00–0.07)
Basophils Absolute: 0.1 10*3/uL (ref 0.0–0.1)
Basophils Relative: 1 %
Eosinophils Absolute: 0 10*3/uL (ref 0.0–0.5)
Eosinophils Relative: 0 %
HCT: 33.9 % — ABNORMAL LOW (ref 36.0–46.0)
Hemoglobin: 9.4 g/dL — ABNORMAL LOW (ref 12.0–15.0)
Immature Granulocytes: 5 %
Lymphocytes Relative: 10 %
Lymphs Abs: 1 10*3/uL (ref 0.7–4.0)
MCH: 25 pg — ABNORMAL LOW (ref 26.0–34.0)
MCHC: 27.7 g/dL — ABNORMAL LOW (ref 30.0–36.0)
MCV: 90.2 fL (ref 80.0–100.0)
Monocytes Absolute: 0.6 10*3/uL (ref 0.1–1.0)
Monocytes Relative: 6 %
Neutro Abs: 7.9 10*3/uL — ABNORMAL HIGH (ref 1.7–7.7)
Neutrophils Relative %: 78 %
Platelets: 354 10*3/uL (ref 150–400)
RBC: 3.76 MIL/uL — ABNORMAL LOW (ref 3.87–5.11)
RDW: 14.1 % (ref 11.5–15.5)
WBC: 10.1 10*3/uL (ref 4.0–10.5)
nRBC: 0 % (ref 0.0–0.2)

## 2020-01-26 LAB — FOLATE: Folate: 11.9 ng/mL (ref >5.9)

## 2020-01-26 LAB — COMPREHENSIVE METABOLIC PANEL
ALT: 9 U/L (ref 0–44)
AST: 15 U/L (ref 15–41)
Albumin: 2.4 g/dL — ABNORMAL LOW (ref 3.5–5.0)
Alkaline Phosphatase: 99 U/L (ref 38–126)
Anion gap: 8 (ref 5–15)
BUN: 20 mg/dL (ref 8–23)
CO2: 35 mmol/L — ABNORMAL HIGH (ref 22–32)
Calcium: 8.9 mg/dL (ref 8.9–10.3)
Chloride: 101 mmol/L (ref 98–111)
Creatinine, Ser: 0.66 mg/dL (ref 0.44–1.00)
GFR calc Af Amer: >60 mL/min (ref >60)
GFR calc non Af Amer: >60 mL/min (ref >60)
Glucose, Bld: 129 mg/dL — ABNORMAL HIGH (ref 70–99)
Potassium: 3.2 mmol/L — ABNORMAL LOW (ref 3.5–5.1)
Sodium: 144 mmol/L (ref 135–145)
Total Bilirubin: 0.4 mg/dL (ref 0.3–1.2)
Total Protein: 6.4 g/dL — ABNORMAL LOW (ref 6.5–8.1)

## 2020-01-26 LAB — MAGNESIUM: Magnesium: 1.6 mg/dL — ABNORMAL LOW (ref 1.7–2.4)

## 2020-01-26 LAB — TSH: TSH: 3.26 u[IU]/mL (ref 0.350–4.500)

## 2020-01-26 LAB — VITAMIN B12: Vitamin B-12: 430 pg/mL (ref 180–914)

## 2020-01-26 NOTE — Progress Notes (Signed)
Patient ID: Marilyn Solomon, female   DOB: August 05, 1945, 74 y.o.   MRN: [id number]  PROGRESS NOTE    Marilyn Solomon  TKP:[id number] DOB: 1945-09-05 DOA: 01/22/2020 PCP: Lucianne Lei, MD   Brief Narrative:  74 year old female with COPD and chronic hypoxic respiratory failure failure on 2 L oxygen,?  MAI, hypothyroidism presented with worsening shortness of breath and cough.  On presentation, creatinine was 1.63, baseline creatinine 0.6.  Chest x-ray showed evidence of chronic changes, unable to rule out low-grade infection.  CT of the chest showed long-standing chronic bronchitis with multifocal airway collapse.   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 At the lung bases there is true consolidative opacities and acute on chronic infection is possible.  She was started on antibiotics.  Pulmonary was consulted.  Assessment & Plan:    Acute kidney injury -Probably from dehydration and poor oral intake.  Creatinine 1.63 on presentation, baseline creatinine 0.6.  Treated with IV fluids.  Creatinine 0.66 today.  No labs today.  Repeat a.m. labs. -Patient was restarted on IV fluids on 01/25/2020 evening because of very poor oral intake  Acute on chronic hypoxic respiratory failure Probable community-acquired bacterial pneumonia Chronic lung disease/?  MAI -Started on Rocephin and Zithromax.  Subsequently switched to Levaquin 500 mg daily as per pulmonary recommendations.  DC Levaquin since quinolones can cause some CNS side effects.  Will resume Rocephin and Zithromax. -Pulmonary recommended AFB x3 sputum testing and subsequently outpatient follow-up -Currently requiring 3.5 L oxygen via nasal cannula.  Incentive spirometry.  Flutter valve -Sputum AFB x1 negative so far.  Encouraged patient to provide more sputum samples  Acute metabolic encephalopathy -Patient was more drowsy yesterday evening  as per the nursing staff.  CT of the head was negative for acute intracranial abnormality.  Dose of Xanax decreased.  More awake but still very slow to respond.  Monitor mental status.  Fall precautions.  PT eval pending  Anxiety/depression -Patient is on very high dose of Xanax 1 mg 3 times a day as needed at home.  I have decreased it to 0.25 mg 3 times a day as needed.  Outpatient follow-up with PCP regarding follow-up of the same  COPD -Continue current treatment.  Outpatient follow-up with pulmonary  Hypothyroidism-continue levothyroxine  Generalized deconditioning -PT eval pending.  Patient is refusing home health because of Covid pandemic   DVT prophylaxis: Heparin Code Status: Full Family Communication: Patient at bedside Disposition Plan: Status is: Inpatient  Remains inpatient appropriate because:Inpatient level of care appropriate due to severity of illness   Dispo: The patient is from: Home              Anticipated d/c is to: Home              Anticipated d/c date is: 2 days              Patient currently is medically stable to d/c.  Will need 2 more negative AFB sputum samples prior to discharge  Consultants: Pulmonary  Procedures: None  Antimicrobials:  Anti-infectives (From admission, onward)   Start     Dose/Rate Route Frequency Ordered Stop   01/24/20 1000  cefdinir (OMNICEF) capsule 300 mg  Status:  Discontinued        300 mg Oral Every 12 hours 01/24/20 0756 01/24/20 0943   01/24/20 1000  levofloxacin (LEVAQUIN) tablet 500 mg        500 mg  Oral Daily 01/24/20 0943     01/23/20 2200  doxycycline (VIBRAMYCIN) 100 mg in sodium chloride 0.9 % 250 mL IVPB  Status:  Discontinued       "Followed by" Linked Group Details   100 mg 125 mL/hr over 120 Minutes Intravenous Every 12 hours 01/23/20 0558 01/24/20 0755   01/23/20 1200  cefTRIAXone (ROCEPHIN) 2 g in sodium chloride 0.9 % 100 mL IVPB  Status:  Discontinued        2 g 200 mL/hr over 30 Minutes Intravenous  Every 24 hours 01/23/20 0558 01/24/20 0755   01/23/20 1000  doxycycline (VIBRAMYCIN) 200 mg in dextrose 5 % 250 mL IVPB       "Followed by" Linked Group Details   200 mg 125 mL/hr over 120 Minutes Intravenous  Once 01/23/20 0558 01/23/20 1203   01/23/20 0245  azithromycin (ZITHROMAX) 500 mg in sodium chloride 0.9 % 250 mL IVPB        500 mg 250 mL/hr over 60 Minutes Intravenous  Once 01/23/20 0233 01/23/20 0639   01/23/20 0245  cefTRIAXone (ROCEPHIN) 1 g in sodium chloride 0.9 % 100 mL IVPB        1 g 200 mL/hr over 30 Minutes Intravenous  Once 01/23/20 0233 01/23/20 0343       Subjective: Patient seen and examined at bedside.  Awake this morning.  Very poor historian.  Does not participate in conversation much.  Still has intermittent cough.  Patient was more drowsy yesterday evening as per nursing staff.  No overnight fever reported  objective: Vitals:   01/25/20 1606 01/25/20 2025 01/25/20 2155 01/26/20 0747  BP: 131/64 139/67 130/68   Pulse: 91 95 90   Resp: 20 18 18    Temp: 97.6 F (36.4 C) 97.8 F (36.6 C) 98 F (36.7 C)   TempSrc: Oral Oral Oral   SpO2: 97% 99% 98% 98%  Weight:      Height:        Intake/Output Summary (Last 24 hours) at 01/26/2020 0812 Last data filed at 01/26/2020 0741 Gross per 24 hour  Intake 843.33 ml  Output 725 ml  Net 118.33 ml   Filed Weights   01/22/20 1611  Weight: 51.7 kg    Examination:  General exam: No distress.  Looks chronically ill.  Poor historian.  Looks very thinly built; currently on 3.5 L oxygen via nasal cannula.  Awake but does not wear support in conversation much respiratory system: Bilateral decreased breath sounds at bases with scattered crackles cardiovascular system: S1-S2 heard, rate controlled Gastrointestinal system: Abdomen is nondistended, soft and nontender.  Normal bowel sounds  extremities: No clubbing or edema   Data Reviewed: I have personally reviewed following labs and imaging studies  CBC: Recent  Labs  Lab 01/23/20 0004 01/24/20 0555 01/26/20 0627  WBC 11.0* 10.1 10.1  NEUTROABS 8.7* 8.3* 7.9*  HGB 10.2* 8.2* 9.4*  HCT 36.0 29.5* 33.9*  MCV 90.0 91.6 90.2  PLT 423* 379 170   Basic Metabolic Panel: Recent Labs  Lab 01/23/20 0004 01/24/20 0555 01/26/20 0627  NA 137 140 144  K 3.9 4.5 3.2*  CL 97* 102 101  CO2 27 29 35*  GLUCOSE 135* 187* 129*  BUN 46* 44* 20  CREATININE 1.63* 0.99 0.66  CALCIUM 8.8* 8.7* 8.9  MG  --  2.5* 1.6*   GFR: Estimated Creatinine Clearance: 51.1 mL/min (by C-G formula based on SCr of 0.66 mg/dL). Liver Function Tests: Recent Labs  Lab 01/26/20 [phone]  AST 15  ALT 9  ALKPHOS 99  BILITOT 0.4  PROT 6.4*  ALBUMIN 2.4*   No results for input(s): LIPASE, AMYLASE in the last 168 hours. No results for input(s): AMMONIA in the last 168 hours. Coagulation Profile: No results for input(s): INR, PROTIME in the last 168 hours. Cardiac Enzymes: No results for input(s): CKTOTAL, CKMB, CKMBINDEX, TROPONINI in the last 168 hours. BNP (last 3 results) No results for input(s): PROBNP in the last 8760 hours. HbA1C: No results for input(s): HGBA1C in the last 72 hours. CBG: Recent Labs  Lab 01/25/20 1823  GLUCAP 101*   Lipid Profile: No results for input(s): CHOL, HDL, LDLCALC, TRIG, CHOLHDL, LDLDIRECT in the last 72 hours. Thyroid Function Tests: No results for input(s): TSH, T4TOTAL, FREET4, T3FREE, THYROIDAB in the last 72 hours. Anemia Panel: No results for input(s): VITAMINB12, FOLATE, FERRITIN, TIBC, IRON, RETICCTPCT in the last 72 hours. Sepsis Labs: No results for input(s): PROCALCITON, LATICACIDVEN in the last 168 hours.  Recent Results (from the past 240 hour(s))  SARS Coronavirus 2 by RT PCR (hospital order, performed in [facility] hospital lab) Nasopharyngeal Nasopharyngeal Swab     Status: None   Collection Time: 01/22/20 11:00 PM   Specimen: Nasopharyngeal Swab  Result Value Ref Range Status   SARS Coronavirus 2 NEGATIVE  NEGATIVE Final    Comment: (NOTE) SARS-CoV-2 target nucleic acids are NOT DETECTED.  The SARS-CoV-2 RNA is generally detectable in upper and lower respiratory specimens during the acute phase of infection. The lowest concentration of SARS-CoV-2 viral copies this assay can detect is 250 copies / mL. A negative result does not preclude SARS-CoV-2 infection and should not be used as the sole basis for treatment or other patient management decisions.  A negative result may occur with improper specimen collection / handling, submission of specimen other than nasopharyngeal swab, presence of viral mutation(s) within the areas targeted by this assay, and inadequate number of viral copies (<250 copies / mL). A negative result must be combined with clinical observations, patient history, and epidemiological information.  Fact Sheet for Patients:   StrictlyIdeas.no  Fact Sheet for Healthcare Providers: BankingDealers.co.za  This test is not yet approved or  cleared by the [place] FDA and has been authorized for detection and/or diagnosis of SARS-CoV-2 by FDA under an Emergency Use Authorization (EUA).  This EUA will remain in effect (meaning this test can be used) for the duration of the COVID-19 declaration under Section 564(b)(1) of the Act, 21 U.S.C. section 360bbb-3(b)(1), unless the authorization is terminated or revoked sooner.  Performed at [facility], [address] [place], [place] [address]   Culture, blood (Routine X 2) w Reflex to ID Panel     Status: None (Preliminary result)   Collection Time: 01/23/20  6:50 AM   Specimen: BLOOD  Result Value Ref Range Status   Specimen Description BLOOD RIGHT ANTECUBITAL  Final   Special Requests   Final    BOTTLES DRAWN AEROBIC AND ANAEROBIC Blood Culture adequate volume   Culture   Final    NO GROWTH 3 DAYS Performed at [facility], [address] [place], [place] [address]     Report Status PENDING  Incomplete  Culture, blood (Routine X 2) w Reflex to ID Panel     Status: None (Preliminary result)   Collection Time: 01/23/20  6:57 AM   Specimen: BLOOD  Result Value Ref Range Status   Specimen Description BLOOD RIGHT ANTECUBITAL  Final   Special Requests  Final    BOTTLES DRAWN AEROBIC AND ANAEROBIC Blood Culture adequate volume   Culture   Final    NO GROWTH 3 DAYS Performed at [facility], [address], [place], [place] [address]    Report Status PENDING  Incomplete  Acid Fast Smear (AFB)     Status: None   Collection Time: 01/24/20  9:43 AM   Specimen: Sputum  Result Value Ref Range Status   AFB Specimen Processing Concentration  Final   Acid Fast Smear Negative  Final    Comment: (NOTE) Performed At: [facility] [address] [place], [place] [id number] Rush Farmer MD ER:[id number]    Source (AFB) SPU  Final    Comment: Performed at [facility], [address], [place], [place] [address]         Radiology Studies: CT HEAD WO CONTRAST  Result Date: 01/25/2020 CLINICAL DATA:  Mental status change EXAM: CT HEAD WITHOUT CONTRAST TECHNIQUE: Contiguous axial images were obtained from the base of the skull through the vertex without intravenous contrast. COMPARISON:  None. FINDINGS: Brain: No evidence of acute infarction, hemorrhage, hydrocephalus, extra-axial collection or mass lesion/mass effect. Vascular: Negative for hyperdense vessel Skull: Negative Sinuses/Orbits: Paranasal sinuses clear. Mastoid clear. Bilateral cataract extraction Other: None IMPRESSION: Negative CT head Electronically Signed   By: Franchot Gallo M.D.   On: 01/25/2020 19:10        Scheduled Meds:  feeding supplement (ENSURE ENLIVE)  237 mL Oral BID BM   fluticasone furoate-vilanterol  1 puff Inhalation Daily   heparin  5,000 Units Subcutaneous Q8H   levofloxacin  500 mg Oral Daily   levothyroxine  50 mcg Oral Daily   multivitamin with minerals  1  tablet Oral Daily   pantoprazole  40 mg Oral Daily   umeclidinium bromide  1 puff Inhalation Daily   Continuous Infusions:  dextrose 5 % and 0.9% NaCl 100 mL/hr at 01/25/20 1834          Aline August, MD [facility] 01/26/2020, 8:12 AM

## 2020-01-26 NOTE — Evaluation (Addendum)
Physical Therapy Evaluation Patient Details Name: Marilyn Solomon MRN: [id number] DOB: [birth year] Today's Date: 01/26/2020   History of Present Illness  This is a 74 year old female with history of COPD and chronic respiratory failure on 2 L of oxygen.  She states she has had a cough for the past few days.  It is a wet sounding cough.  She reports a headache that has since resolved.  She states she is not short of breath, she is not wheezing.  She states she is drinking but not eating.  She adds she has no appetite.  She denies any nausea, vomiting or diarrhea.  She did have a little bit of stomach ache but that resolved.  She denies any fevers or chills.  She did not feel well so she decided to come to the ER.  She states she is urinating okay, she states she is not lightheaded or dizzy.   In the ER the patient creatinine is increased.  Baseline creatinine 0.6, current creatinine 1.68.  Chest x-ray shows evidence of chronic changes, unable to rule out indolent infection.  The hospitalist have been asked to admit. History provided by the patient is alert and oriented.    Clinical Impression  The patient's O2 sat on 2L [place] was 91% at rest, but declined to 75% once the patient was standing. The patient was lethargic upon entering, but was willing to participate in physical therapy. The patient was slightly dizzy upon sitting when performing supine to sit transfer mod assist. O2 sat was 92% after 2 minutes sitting EOB. Patient then performed sit to stand min assist and displayed significant BLE weakness after 2 minutes in standing position, with O2 declined to 75%. The patient had been able to take a few steps, but once O2 declined significantly patient was returned to bed with sit to supine transfer mod assist. The patient then displayed anxious, restless, and impulsive behavior, repeatedly removing [place] and reporting she could not sleep or rest. She tried to climb out of bed as well. O2 sat rose back up to  92% - RN notified about O2 decline and change in behavior. The patient was left in bed with bed alarm set after therapy. PLAN: The patient will continue to benefit from skilled physical therapy services in hospital at recommended venue below in order to improve balance, gait, and ADL's to promote independence in functional activities.     Follow Up Recommendations SNF    Equipment Recommendations  None recommended by PT    Recommendations for Other Services       Precautions / Restrictions Precautions Precautions: Fall Restrictions Weight Bearing Restrictions: No      Mobility  Bed Mobility Overal bed mobility: Needs Assistance Bed Mobility: Supine to Sit     Supine to sit: Mod assist     General bed mobility comments: pt able to follow multi-step commands for bed mobility but generalized BLE weakness limiting functional task  Transfers Overall transfer level: Needs assistance Equipment used: Rolling walker (2 wheeled) Transfers: Sit to/from Stand Sit to Stand: Min assist         General transfer comment: BLE weakness  Ambulation/Gait Ambulation/Gait assistance: Min assist Gait Distance (Feet): 4 Feet Assistive device: Rolling walker (2 wheeled) Gait Pattern/deviations: Step-to pattern;Decreased stride length     General Gait Details: stopped ambulation due to decreased cardiopulmonary status  Stairs            Wheelchair Mobility    Modified Rankin (Stroke Patients Only)  Balance Overall balance assessment: Needs assistance Sitting-balance support: Bilateral upper extremity supported;Feet supported Sitting balance-Leahy Scale: Fair Sitting balance - Comments: pt slightly tachypnic upon sitting EOB   Standing balance support: Bilateral upper extremity supported;During functional activity Standing balance-Leahy Scale: Fair Standing balance comment: had pt sit due to decline in cardiopulmonary status                              Pertinent Vitals/Pain Pain Assessment: No/denies pain    Home Living Family/patient expects to be discharged to:: Private residence Living Arrangements: Children Available Help at Discharge: Family;Available 24 hours/day Type of Home: House Home Access: Stairs to enter Entrance Stairs-Rails: Right;Left;Can reach both Entrance Stairs-Number of Steps: 2-3 Home Layout: Two level;Able to live on main level with bedroom/bathroom Home Equipment: Gilford Rile - 2 wheels;Bedside commode;Cane - quad;Shower seat      Prior Function Level of Independence: Independent with assistive device(s)         Comments: uses RW for household and community ambulation; granddaughter helps around house as needed; grandson and granddaughter do the shopping for pt although pt reports she is still driving     Hand Dominance   Dominant Hand: Right    Extremity/Trunk Assessment   Upper Extremity Assessment Upper Extremity Assessment: Overall WFL for tasks assessed    Lower Extremity Assessment Lower Extremity Assessment: Generalized weakness    Cervical / Trunk Assessment Cervical / Trunk Assessment: Normal  Communication   Communication: No difficulties  Cognition Arousal/Alertness: Lethargic Behavior During Therapy: Anxious;Restless (behavior declined during last 1/3 of session) Overall Cognitive Status: Impaired/Different from baseline                                 General Comments: pt lethargic for first 1/3 of session, then was willing to participate but behavior became anxious and restless during last 1/3 of session      General Comments      Exercises     Assessment/Plan    PT Assessment Patient needs continued PT services  PT Problem List Decreased strength;Decreased mobility;Decreased safety awareness;Decreased range of motion;Decreased coordination;Decreased knowledge of precautions;Decreased activity tolerance;Decreased cognition;Cardiopulmonary status  limiting activity;Decreased balance;Decreased knowledge of use of DME       PT Treatment Interventions Therapeutic exercise;Gait training;Balance training;DME instruction;Functional mobility training;Therapeutic activities;Patient/family education;Cognitive remediation    PT Goals (Current goals can be found in the Care Plan section)  Acute Rehab PT Goals Patient Stated Goal: go home w assist PT Goal Formulation: With patient Time For Goal Achievement: 02/09/20 Potential to Achieve Goals: Good    Frequency Min 3X/week   Barriers to discharge        Co-evaluation               AM-PAC PT "6 Clicks" Mobility  Outcome Measure Help needed turning from your back to your side while in a flat bed without using bedrails?: None Help needed moving from lying on your back to sitting on the side of a flat bed without using bedrails?: A Little Help needed moving to and from a bed to a chair (including a wheelchair)?: A Little Help needed standing up from a chair using your arms (e.g., wheelchair or bedside chair)?: A Little Help needed to walk in hospital room?: A Little Help needed climbing 3-5 steps with a railing? : A Lot 6 Click Score: 18    End  of Session Equipment Utilized During Treatment: Gait belt;Oxygen Activity Tolerance: Treatment limited secondary to medical complications (Comment);Patient limited by fatigue (decline in cardiopulmonary status) Patient left: in bed;with call bell/phone within reach;with bed alarm set Nurse Communication: Mobility status (O2 status) PT Visit Diagnosis: Unsteadiness on feet (R26.81);Muscle weakness (generalized) (M62.81);History of falling (Z91.81);Difficulty in walking, not elsewhere classified (R26.2)    Time: 1320-1350 PT Time Calculation (min) (ACUTE ONLY): 30 min   Charges:   PT Evaluation $PT Eval Moderate Complexity: 1 Mod PT Treatments $Therapeutic Activity: 23-37 mins        3:56 PM , 01/26/20 Karlyn Agee, SPT Physical  Therapy with [facility] [phone] office   During this treatment session, the therapist was present, participating in and directing the treatment.  3:56 PM, 01/26/20 Lonell Grandchild, MPT Physical Therapist with [facility] [phone] office [phone] mobile phone

## 2020-01-26 NOTE — Plan of Care (Addendum)
  Problem: Acute Rehab PT Goals(only PT should resolve) Goal: Pt Will Go Supine/Side To Sit 01/26/2020 1530 by Karlyn Agee, Student-PT Outcome: Progressing Flowsheets (Taken 01/26/2020 1530) Pt will go Supine/Side to Sit: with modified independence 01/26/2020 1530 by Karlyn Agee, Student-PT Outcome: Progressing Goal: Patient Will Transfer Sit To/From Stand 01/26/2020 1530 by Karlyn Agee, Student-PT Outcome: Progressing Flowsheets (Taken 01/26/2020 1530) Patient will transfer sit to/from stand: with minimal assist 01/26/2020 1530 by Karlyn Agee, Student-PT Outcome: Progressing Goal: Pt Will Transfer Bed To Chair/Chair To Bed 01/26/2020 1530 by Karlyn Agee, Student-PT Outcome: Progressing Flowsheets (Taken 01/26/2020 1530) Pt will Transfer Bed to Chair/Chair to Bed: with mod assist 01/26/2020 1530 by Karlyn Agee, Student-PT Outcome: Progressing Goal: Pt Will Ambulate 01/26/2020 1530 by Karlyn Agee, Student-PT Outcome: Progressing Flowsheets (Taken 01/26/2020 1530) Pt will Ambulate: . 15 feet . with minimal assist . with rolling walker 01/26/2020 1530 by Karlyn Agee, Student-PT Outcome: Progressing   3:31 PM , 01/26/20 Karlyn Agee, SPT Physical Therapy with [facility] [phone] office   During this treatment session, the therapist was present, participating in and directing the treatment.  3:57 PM, 01/26/20 Lonell Grandchild, MPT Physical Therapist with [facility] [phone] office [phone] mobile phone

## 2020-01-27 LAB — CBC WITH DIFFERENTIAL/PLATELET
Abs Immature Granulocytes: 0.77 10*3/uL — ABNORMAL HIGH (ref 0.00–0.07)
Basophils Absolute: 0.1 10*3/uL (ref 0.0–0.1)
Basophils Relative: 1 %
Eosinophils Absolute: 0.1 10*3/uL (ref 0.0–0.5)
Eosinophils Relative: 1 %
HCT: 33.1 % — ABNORMAL LOW (ref 36.0–46.0)
Hemoglobin: 9.5 g/dL — ABNORMAL LOW (ref 12.0–15.0)
Immature Granulocytes: 7 %
Lymphocytes Relative: 11 %
Lymphs Abs: 1.2 10*3/uL (ref 0.7–4.0)
MCH: 25.3 pg — ABNORMAL LOW (ref 26.0–34.0)
MCHC: 28.7 g/dL — ABNORMAL LOW (ref 30.0–36.0)
MCV: 88.3 fL (ref 80.0–100.0)
Monocytes Absolute: 0.5 10*3/uL (ref 0.1–1.0)
Monocytes Relative: 4 %
Neutro Abs: 8.2 10*3/uL — ABNORMAL HIGH (ref 1.7–7.7)
Neutrophils Relative %: 76 %
Platelets: 285 10*3/uL (ref 150–400)
RBC: 3.75 MIL/uL — ABNORMAL LOW (ref 3.87–5.11)
RDW: 14.1 % (ref 11.5–15.5)
WBC: 10.9 10*3/uL — ABNORMAL HIGH (ref 4.0–10.5)
nRBC: 0.2 % (ref 0.0–0.2)

## 2020-01-27 LAB — BASIC METABOLIC PANEL
Anion gap: 9 (ref 5–15)
BUN: 8 mg/dL (ref 8–23)
CO2: 34 mmol/L — ABNORMAL HIGH (ref 22–32)
Calcium: 8.6 mg/dL — ABNORMAL LOW (ref 8.9–10.3)
Chloride: 99 mmol/L (ref 98–111)
Creatinine, Ser: 0.57 mg/dL (ref 0.44–1.00)
GFR calc Af Amer: >60 mL/min (ref >60)
GFR calc non Af Amer: >60 mL/min (ref >60)
Glucose, Bld: 140 mg/dL — ABNORMAL HIGH (ref 70–99)
Potassium: 3.2 mmol/L — ABNORMAL LOW (ref 3.5–5.1)
Sodium: 142 mmol/L (ref 135–145)

## 2020-01-27 LAB — MAGNESIUM: Magnesium: 1.3 mg/dL — ABNORMAL LOW (ref 1.7–2.4)

## 2020-01-27 LAB — AMMONIA: Ammonia: 23 umol/L (ref 9–35)

## 2020-01-27 MED ORDER — MAGNESIUM SULFATE 4 GM/100ML IV SOLN
4.0000 g | Freq: Once | INTRAVENOUS | Status: AC
  Administered 2020-01-27: 4 g via INTRAVENOUS
  Filled 2020-01-27: qty 100

## 2020-01-27 MED ORDER — PANTOPRAZOLE SODIUM 40 MG PO TBEC: 40.0000 mg | DELAYED_RELEASE_TABLET | ORAL | Status: DC | PRN

## 2020-01-27 MED ORDER — POTASSIUM CHLORIDE CRYS ER 20 MEQ PO TBCR
40.0000 meq | EXTENDED_RELEASE_TABLET | ORAL | Status: DC
  Administered 2020-01-27: 40 meq via ORAL
  Filled 2020-01-27: qty 2

## 2020-01-27 MED ORDER — LEVOFLOXACIN 500 MG PO TABS: 500.0000 mg | ORAL_TABLET | Freq: Every day | ORAL | 0 refills | Status: AC

## 2020-01-27 MED ORDER — ALPRAZOLAM 1 MG PO TABS: 0.5000 mg | ORAL_TABLET | Freq: Three times a day (TID) | ORAL | Status: DC | PRN

## 2020-01-27 NOTE — Discharge Summary (Addendum)
Physician Discharge Summary  Marilyn Solomon BSJ:[id number] DOB: 12-15-1945 DOA: 01/22/2020  PCP: Lucianne Lei, MD  Admit date: 01/22/2020 Discharge date: 01/27/2020  Admitted From: Home Disposition: Home.  Patient refused SNF placement  Recommendations for Outpatient Follow-up:  1. Follow up with PCP in 1 week with repeat CBC/BMP 2. Outpatient follow-up with pulmonary 3. Follow up in ED if symptoms worsen or new appear   Home Health: Patient refused home health services Equipment/Devices: Continue supplemental oxygen.  Patient uses oxygen via nasal cannula at 2L/min at home normally.  Discharge Condition: Guarded CODE STATUS: Full Diet recommendation: Regular  Brief/Interim Summary: 74 year old female with COPD and chronic hypoxic respiratory failure failure on 2 L oxygen,?  MAI, hypothyroidism presented with worsening shortness of breath and cough.  On presentation, creatinine was 1.63, baseline creatinine 0.6.  Chest x-ray showed evidence of chronic changes, unable to rule out low-grade infection.  CT of the chest showed long-standing chronic bronchitis with multifocal airway collapse.   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 At the lung bases there is true consolidative opacities and acute on chronic infection is possible.  She was started on antibiotics.  Pulmonary was consulted.  Pulmonary recommended sputum AFB testing and outpatient follow-up with pulmonary along with antibiotic treatment for total of 10 days.  AFB x1 has been negative so far; second AFB test result is pending and third sputum sample will be collected prior to discharge today.  Patient is very deconditioned.  PT recommended SNF placement.  Patient refused SNF placement and is also not agreeable for home health services.  She will be discharged home with outpatient follow-up with PCP and pulmonary.   Discharge  Diagnoses:   Acute kidney injury -Probably from dehydration and poor oral intake.  Creatinine 1.63 on presentation, baseline creatinine 0.6.  Treated with IV fluids.  Creatinine 0.66 today.  No labs today.  Repeat a.m. labs. -Patient was restarted on IV fluids on 01/25/2020 evening because of very poor oral intake -Creatinine stable.  Outpatient follow-up of creatinine.  Acute on chronic hypoxic respiratory failure Probable community-acquired bacterial pneumonia Chronic lung disease/?  MAI -Started on Rocephin and Zithromax.  Subsequently switched to Levaquin 500 mg daily as per pulmonary recommendations.    Continue Levaquin for 5 more days to complete 10-day course of therapy of antibiotics as per pulmonary recommendations.  Outpatient follow-up with pulmonary. -Pulmonary recommended AFB x3 sputum testing and subsequently outpatient follow-up -Currently requiring 2 oxygen via nasal cannula.  -AFB x1 has been negative so far; second AFB test result is pending and third sputum sample will be collected prior to discharge today.   Acute metabolic encephalopathy -Patient was more drowsy on the evening of 01/25/2020 as per the nursing staff.  CT of the head was negative for acute intracranial abnormality.  Dose of Xanax decreased.  More awake but still very slow to respond.   -PT recommended SNF placement but patient adamant about not going to SNF and also adamant that she does not any home health services  Anxiety/depression -Patient is on very high dose of Xanax 1 mg 3 times a day as needed at home.  I have decreased it to 0.25 mg 3 times a day as needed.  Discharged home on 0.5 mg 3 times a day as needed.  Outpatient follow-up with PCP regarding follow-up of the same.  COPD -Continue home regimen. Outpatient follow-up with pulmonary  Hypothyroidism-continue levothyroxine  Generalized deconditioning -PT  recommendations as above. Patient is refusing home health because of Covid  pandemic  Discharge Instructions   Allergies as of 01/27/2020      Reactions   Penicillins Itching      Vicodin [hydrocodone-acetaminophen] Nausea And Vomiting      Medication List    STOP taking these medications   dextromethorphan 30 MG/5ML liquid Commonly known as: DELSYM   ibuprofen 200 MG tablet Commonly known as: ADVIL     TAKE these medications   albuterol 108 (90 Base) MCG/ACT inhaler Commonly known as: VENTOLIN HFA Inhale 1-2 puffs into the lungs every 6 (six) hours as needed for wheezing or shortness of breath.   ALPRAZolam 1 MG tablet Commonly known as: XANAX Take 0.5 tablets (0.5 mg total) by mouth every 8 (eight) hours as needed for anxiety. What changed: how much to take   ipratropium-albuterol 0.5-2.5 (3) MG/3ML Soln Commonly known as: DUONEB Take 3 mLs by nebulization every 6 (six) hours as needed (shortness of breath or wheezing).   levofloxacin 500 MG tablet Commonly known as: LEVAQUIN Take 1 tablet (500 mg total) by mouth daily for 5 days. Start taking on: January 28, 2020   levothyroxine 50 MCG tablet Commonly known as: SYNTHROID Take 50 mcg by mouth daily.   OVER THE COUNTER MEDICATION Over the counter cough syrup -  Take tablespoon twice daily as needed for cough   oxyCODONE 15 MG immediate release tablet Commonly known as: ROXICODONE Take 15 mg by mouth every 8 (eight) hours as needed for pain.   pantoprazole 40 MG tablet Commonly known as: PROTONIX Take 1 tablet (40 mg total) by mouth as needed (for acid reflux).   Trelegy Ellipta 100-62.5-25 MCG/INH Aepb Generic drug: Fluticasone-Umeclidin-Vilant Inhale 1 puff into the lungs daily.   Vitamin D (Ergocalciferol) 1.25 MG (50000 UNIT) Caps capsule Commonly known as: DRISDOL Take 50,000 Units by mouth every 7 (seven) days.        Follow-up Information    Lucianne Lei, MD. Schedule an appointment as soon as possible for a visit in 1 week(s).   Specialty: Family Medicine Contact  information: [address] [place] [address] [phone]        Tanda Rockers, MD. Schedule an appointment as soon as possible for a visit in 1 week(s).   Specialty: Pulmonary Disease Contact information: [place] [address] [phone]              Allergies  Allergen Reactions  . Penicillins Itching       . Vicodin [Hydrocodone-Acetaminophen] Nausea And Vomiting    Consultations:  Pulmonary   Procedures/Studies: CT HEAD WO CONTRAST  Result Date: 01/25/2020 CLINICAL DATA:  Mental status change EXAM: CT HEAD WITHOUT CONTRAST TECHNIQUE: Contiguous axial images were obtained from the base of the skull through the vertex without intravenous contrast. COMPARISON:  None. FINDINGS: Brain: No evidence of acute infarction, hemorrhage, hydrocephalus, extra-axial collection or mass lesion/mass effect. Vascular: Negative for hyperdense vessel Skull: Negative Sinuses/Orbits: Paranasal sinuses clear. Mastoid clear. Bilateral cataract extraction Other: None IMPRESSION: Negative CT head Electronically Signed   By: Franchot Gallo M.D.   On: 01/25/2020 19:10   CT CHEST WO CONTRAST  Result Date: 01/23/2020 CLINICAL DATA:  Cough and persistent abnormal chest x-ray. EXAM: CT CHEST WITHOUT CONTRAST TECHNIQUE: Multidetector CT imaging of the chest was performed following the standard protocol without IV contrast. COMPARISON:  06/30/2017 and multiple intervening chest x-rays. FINDINGS: Cardiovascular: Normal heart size.  No pericardial effusion. Diffuse atherosclerotic calcification of the aorta and coronaries Mediastinum/Nodes: Chronic mediastinal and hilar adenopathy which is considered reactive to the pulmonary disease. Small sliding hiatal hernia. Long-standing bulla superior to the left mainstem bronchus. Lungs/Pleura: Chronic diffuse bronchitis with airway thickening and mucoid impaction/airway collapse at multiple levels. This was seen on a 2013 chest  CT. There is peribronchovascular reticular and nodular densities with generalized involvement. Consolidative opacities are seen in the subpleural lower lobes. The pattern is similar to 2019 although progressive. In 2013 these findings were primarily seen at the right upper lobe where there is some honeycombing. Suspect chronic indolent infection as with mycobacterium. There could be acute pneumonia in this setting. Trace pleural effusions. No air leak. No indication of pulmonary edema. Upper Abdomen: Negative Musculoskeletal: No acute or aggressive finding. Exaggerated thoracic kyphosis IMPRESSION: 1. Long-standing chronic bronchitis with multifocal airway collapse. 2. 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 At the lung bases there is true consolidative opacities and acute on chronic infection is possible. Aortic Atherosclerosis (ICD10-I70.0).  Coronary atherosclerosis. Electronically Signed   By: Monte Fantasia M.D.   On: 01/23/2020 05:01   NM Pulmonary Perfusion  Result Date: 01/23/2020 CLINICAL DATA:  Evaluate for pulmonary embolus. EXAM: NUCLEAR MEDICINE PERFUSION LUNG SCAN TECHNIQUE: Perfusion images were obtained in multiple projections after intravenous injection of radiopharmaceutical. Ventilation scans intentionally deferred if perfusion scan and chest x-ray adequate for interpretation during COVID 19 epidemic. RADIOPHARMACEUTICALS:  4.4 mCi Tc-57m MAA IV COMPARISON:  Chest CT from 01/23/2020 and chest radiograph 01/22/2020 FINDINGS: Review of chest CT shows bilateral, multifocal pulmonary densities involving both the upper and lower lung zones. The airspace consolidation is noted in both lung bases and there is a small left pleural effusion. On the perfusion images there is regional decreased radiotracer within the lower lung zones bilaterally. More focal areas of decreased perfusion are identified  within the right apex, anterior right upper lobe, superior segment of right lower lobe and anterior left upper lobe. IMPRESSION: 1. Findings are indeterminate. Multiple perfusion abnormalities are identified. However, In the setting of abnormal chest radiograph showing multiple bilateral pulmonary opacities findings are nonspecific and not diagnostic for the presence or absence of pulmonary embolus. If there is a continued clinical concern for pulmonary embolus further investigation with lower extremity Doppler and or CTA of the chest is recommended. Electronically Signed   By: Kerby Moors M.D.   On: 01/23/2020 08:52   DG Chest Portable 1 View  Result Date: 01/22/2020 CLINICAL DATA:  Cough EXAM: PORTABLE CHEST 1 VIEW COMPARISON:  10/07/2019, 06/21/2018, 08/12/2018 01/31/2018 chest CT 06/30/2017 FINDINGS: Coarse reticular and slightly nodular opacities throughout the bilateral lungs, chronic to recent priors from 2001, progressive as compared with more remote exams. No acute superimposed confluent airspace disease or effusion. Stable cardiomediastinal silhouette. Aortic atherosclerosis. No pneumothorax. IMPRESSION: Chronic coarse reticular and slightly nodular opacities throughout the bilateral lungs consistent with chronic interstitial disease and possible chronic atypical or indolent infection. This is stable compared with most recent priors but is progressive as compared with more remote exams. No definite acute superimposed confluent airspace disease. Electronically Signed   By: Donavan Foil M.D.   On: 01/22/2020 16:53       Subjective: Patient seen and examined at bedside.  Very poor historian, very slow to respond to questions.  States that she wants to go home today and is adamant about it.  No overnight fever, vomiting or  worsening shortness of breath reported.  Discharge Exam: Vitals:   01/27/20 0440 01/27/20 0806  BP: (!) 154/67   Pulse: 95   Resp: 16   Temp: 97.8 F (36.6 C)    SpO2: 95% 95%    General: Pt is awake, very poor historian.  Chronically ill.  Very thinly built. Currently on 2L/min nasal cannula Cardiovascular: rate controlled, S1/S2 + Respiratory: bilateral decreased breath sounds at bases with some scattered crackles Abdominal: Soft, NT, ND, bowel sounds + Extremities: no edema, no cyanosis    The results of significant diagnostics from this hospitalization (including imaging, microbiology, ancillary and laboratory) are listed below for reference.     Microbiology: Recent Results (from the past 240 hour(s))  SARS Coronavirus 2 by RT PCR (hospital order, performed in [facility] hospital lab) Nasopharyngeal Nasopharyngeal Swab     Status: None   Collection Time: 01/22/20 11:00 PM   Specimen: Nasopharyngeal Swab  Result Value Ref Range Status   SARS Coronavirus 2 NEGATIVE NEGATIVE Final    Comment: (NOTE) SARS-CoV-2 target nucleic acids are NOT DETECTED.  The SARS-CoV-2 RNA is generally detectable in upper and lower respiratory specimens during the acute phase of infection. The lowest concentration of SARS-CoV-2 viral copies this assay can detect is 250 copies / mL. A negative result does not preclude SARS-CoV-2 infection and should not be used as the sole basis for treatment or other patient management decisions.  A negative result may occur with improper specimen collection / handling, submission of specimen other than nasopharyngeal swab, presence of viral mutation(s) within the areas targeted by this assay, and inadequate number of viral copies (<250 copies / mL). A negative result must be combined with clinical observations, patient history, and epidemiological information.  Fact Sheet for Patients:   StrictlyIdeas.no  Fact Sheet for Healthcare Providers: BankingDealers.co.za  This test is not yet approved or  cleared by the [place] FDA and has been authorized for detection  and/or diagnosis of SARS-CoV-2 by FDA under an Emergency Use Authorization (EUA).  This EUA will remain in effect (meaning this test can be used) for the duration of the COVID-19 declaration under Section 564(b)(1) of the Act, 21 U.S.C. section 360bbb-3(b)(1), unless the authorization is terminated or revoked sooner.  Performed at [facility], [address] [place], [place] [address]   Culture, blood (Routine X 2) w Reflex to ID Panel     Status: None (Preliminary result)   Collection Time: 01/23/20  6:50 AM   Specimen: BLOOD  Result Value Ref Range Status   Specimen Description BLOOD RIGHT ANTECUBITAL  Final   Special Requests   Final    BOTTLES DRAWN AEROBIC AND ANAEROBIC Blood Culture adequate volume   Culture   Final    NO GROWTH 4 DAYS Performed at [facility], [address], [place], [place] [address]    Report Status PENDING  Incomplete  Culture, blood (Routine X 2) w Reflex to ID Panel     Status: None (Preliminary result)   Collection Time: 01/23/20  6:57 AM   Specimen: BLOOD  Result Value Ref Range Status   Specimen Description BLOOD RIGHT ANTECUBITAL  Final   Special Requests   Final    BOTTLES DRAWN AEROBIC AND ANAEROBIC Blood Culture adequate volume   Culture   Final    NO GROWTH 4 DAYS Performed at [facility], [address], [place],  [address]    Report Status PENDING  Incomplete  Acid Fast Smear (AFB)  Status: None   Collection Time: 01/24/20  9:43 AM   Specimen: Sputum  Result Value Ref Range Status   AFB Specimen Processing Concentration  Final   Acid Fast Smear Negative  Final    Comment: (NOTE) Performed At: [facility] [place], [place] [id number] Rush Farmer MD [id number]    Source (AFB) SPU  Final    Comment: Performed at [facility], [address] [place], [place] [address]     Labs: BNP (last 3 results) Recent Labs    01/23/20 0004  BNP 878.6*   Basic Metabolic Panel: Recent Labs  Lab  01/23/20 0004 01/24/20 0555 01/26/20 0627 01/27/20 0711  NA 137 140 144 142  K 3.9 4.5 3.2* 3.2*  CL 97* 102 101 99  CO2 27 29 35* 34*  GLUCOSE 135* 187* 129* 140*  BUN 46* 44* 20 8  CREATININE 1.63* 0.99 0.66 0.57  CALCIUM 8.8* 8.7* 8.9 8.6*  MG  --  2.5* 1.6* 1.3*   Liver Function Tests: Recent Labs  Lab 01/26/20 0627  AST 15  ALT 9  ALKPHOS 99  BILITOT 0.4  PROT 6.4*  ALBUMIN 2.4*   No results for input(s): LIPASE, AMYLASE in the last 168 hours. Recent Labs  Lab 01/27/20 0711  AMMONIA 23   CBC: Recent Labs  Lab 01/23/20 0004 01/24/20 0555 01/26/20 0627 01/27/20 0713  WBC 11.0* 10.1 10.1 10.9*  NEUTROABS 8.7* 8.3* 7.9* 8.2*  HGB 10.2* 8.2* 9.4* 9.5*  HCT 36.0 29.5* 33.9* 33.1*  MCV 90.0 91.6 90.2 88.3  PLT 423* 379 354 285   Cardiac Enzymes: No results for input(s): CKTOTAL, CKMB, CKMBINDEX, TROPONINI in the last 168 hours. BNP: Invalid input(s): POCBNP CBG: Recent Labs  Lab 01/25/20 1823  GLUCAP 101*   D-Dimer No results for input(s): DDIMER in the last 72 hours. Hgb A1c No results for input(s): HGBA1C in the last 72 hours. Lipid Profile No results for input(s): CHOL, HDL, LDLCALC, TRIG, CHOLHDL, LDLDIRECT in the last 72 hours. Thyroid function studies Recent Labs    01/26/20 0627  TSH 3.260   Anemia work up Recent Labs    01/26/20 0627  VITAMINB12 430  FOLATE 11.9   Urinalysis    Component Value Date/Time   COLORURINE AMBER (A) 06/21/2018 1450   APPEARANCEUR CLOUDY (A) 06/21/2018 1450   LABSPEC 1.020 06/21/2018 1450   PHURINE 5.0 06/21/2018 1450   GLUCOSEU NEGATIVE 06/21/2018 1450   HGBUR NEGATIVE 06/21/2018 1450   BILIRUBINUR NEGATIVE 06/21/2018 1450   KETONESUR NEGATIVE 06/21/2018 1450   PROTEINUR 30 (A) 06/21/2018 1450   UROBILINOGEN 0.2 03/25/2008 1702   NITRITE NEGATIVE 06/21/2018 1450   LEUKOCYTESUR TRACE (A) 06/21/2018 1450   Sepsis Labs Invalid input(s): PROCALCITONIN,  WBC,  LACTICIDVEN Microbiology Recent  Results (from the past 240 hour(s))  SARS Coronavirus 2 by RT PCR (hospital order, performed in [facility] hospital lab) Nasopharyngeal Nasopharyngeal Swab     Status: None   Collection Time: 01/22/20 11:00 PM   Specimen: Nasopharyngeal Swab  Result Value Ref Range Status   SARS Coronavirus 2 NEGATIVE NEGATIVE Final    Comment: (NOTE) SARS-CoV-2 target nucleic acids are NOT DETECTED.  The SARS-CoV-2 RNA is generally detectable in upper and lower respiratory specimens during the acute phase of infection. The lowest concentration of SARS-CoV-2 viral copies this assay can detect is 250 copies / mL. A negative result does not preclude SARS-CoV-2 infection and should not be used as the sole basis for treatment or  other patient management decisions.  A negative result may occur with improper specimen collection / handling, submission of specimen other than nasopharyngeal swab, presence of viral mutation(s) within the areas targeted by this assay, and inadequate number of viral copies (<250 copies / mL). A negative result must be combined with clinical observations, patient history, and epidemiological information.  Fact Sheet for Patients:   StrictlyIdeas.no  Fact Sheet for Healthcare Providers: BankingDealers.co.za  This test is not yet approved or  cleared by the [place] FDA and has been authorized for detection and/or diagnosis of SARS-CoV-2 by FDA under an Emergency Use Authorization (EUA).  This EUA will remain in effect (meaning this test can be used) for the duration of the COVID-19 declaration under Section 564(b)(1) of the Act, 21 U.S.C. section 360bbb-3(b)(1), unless the authorization is terminated or revoked sooner.  Performed at [facility], [address], [place], [place] [address]   Culture, blood (Routine X 2) w Reflex to ID Panel     Status: None (Preliminary result)   Collection Time: 01/23/20  6:50 AM   Specimen:  BLOOD  Result Value Ref Range Status   Specimen Description BLOOD RIGHT ANTECUBITAL  Final   Special Requests   Final    BOTTLES DRAWN AEROBIC AND ANAEROBIC Blood Culture adequate volume   Culture   Final    NO GROWTH 4 DAYS Performed at [facility], [address], [place], [place] [address]    Report Status PENDING  Incomplete  Culture, blood (Routine X 2) w Reflex to ID Panel     Status: None (Preliminary result)   Collection Time: 01/23/20  6:57 AM   Specimen: BLOOD  Result Value Ref Range Status   Specimen Description BLOOD RIGHT ANTECUBITAL  Final   Special Requests   Final    BOTTLES DRAWN AEROBIC AND ANAEROBIC Blood Culture adequate volume   Culture   Final    NO GROWTH 4 DAYS Performed at [facility], [address], [place], [place] [address]    Report Status PENDING  Incomplete  Acid Fast Smear (AFB)     Status: None   Collection Time: 01/24/20  9:43 AM   Specimen: Sputum  Result Value Ref Range Status   AFB Specimen Processing Concentration  Final   Acid Fast Smear Negative  Final    Comment: (NOTE) Performed At: [facility] [place], [place] [id number] Rush Farmer MD [id number]    Source (AFB) SPU  Final    Comment: Performed at [facility], [address], [place], [place] [address]     Time coordinating discharge: 35 minutes  SIGNED:   Aline August, MD  [facility] 01/27/2020, 10:58 AM

## 2020-01-27 NOTE — Progress Notes (Addendum)
Physical Therapy Treatment Patient Details Name: Marilyn Solomon MRN: [id number] DOB: 03-20-1946 Today's Date: 01/27/2020    History of Present Illness This is a 74 year old female with history of COPD and chronic respiratory failure on 2 L of oxygen.  She states she has had a cough for the past few days.  It is a wet sounding cough.  She reports a headache that has since resolved.  She states she is not short of breath, she is not wheezing.  She states she is drinking but not eating.  She adds she has no appetite.  She denies any nausea, vomiting or diarrhea.  She did have a little bit of stomach ache but that resolved.  She denies any fevers or chills.  She did not feel well so she decided to come to the ER.  She states she is urinating okay, she states she is not lightheaded or dizzy.   In the ER the patient creatinine is increased.  Baseline creatinine 0.6, current creatinine 1.68.  Chest x-ray shows evidence of chronic changes, unable to rule out indolent infection.  The hospitalist have been asked to admit. History provided by the patient is alert and oriented.    PT Comments    Pt initially appears lethargic, but reports she hasn't been able to sleep or get good rest at the hospital and she is just tired. Pt requesting to go home and able to verbalize multiple family members who are available to assist pt at home. Pt requires no physical assist with mobility today, therapist managing IV and O2 lines during ambulation to prevent tripping hazard. Pt reports this distance is her "normal" because of her O2 tank/tubing at home. Pt able to continue conversation with mild SOB noted. Pt tolerates remaining up in chair at EOS; RN notified of session. Patient will benefit from continued physical therapy in hospital and recommendations below to increase strength, balance, endurance for safe ADLs and gait.    Follow Up Recommendations  SNF     Equipment Recommendations  None recommended by PT     Recommendations for Other Services       Precautions / Restrictions Precautions Precautions: Fall Restrictions Weight Bearing Restrictions: No    Mobility  Bed Mobility Overal bed mobility: Needs Assistance Bed Mobility: Supine to Sit  Supine to sit: Supervision  General bed mobility comments: increasead time, slightly labored  Transfers Overall transfer level: Needs assistance Equipment used: Rolling walker (2 wheeled) Transfers: Sit to/from Stand Sit to Stand: Min guard  General transfer comment: BUE assist to power up, slightly labored, no physical assist or cues from therapist  Ambulation/Gait Ambulation/Gait assistance: Min guard Gait Distance (Feet): 20 Feet Assistive device: Rolling walker (2 wheeled) Gait Pattern/deviations: Step-to pattern;Decreased stride length Gait velocity: decreased   General Gait Details: pt ambulates around room, able to continue conversation with mild SOB, therapist managed O2 and IV lines and no physical assist, no LOB   Stairs             Wheelchair Mobility    Modified Rankin (Stroke Patients Only)       Balance Overall balance assessment: Needs assistance Sitting-balance support: Feet supported;No upper extremity supported Sitting balance-Leahy Scale: Good Sitting balance - Comments: seated EOB, flexed trunk in sitting   Standing balance support: During functional activity;Bilateral upper extremity supported Standing balance-Leahy Scale: Poor Standing balance comment: RW for steadying       Cognition Arousal/Alertness: Awake/alert Behavior During Therapy: WFL for tasks assessed/performed Overall Cognitive Status:  Within Functional Limits for tasks assessed             Exercises      General Comments General comments (skin integrity, edema, etc.): 91-97% on 2L O2 during session      Pertinent Vitals/Pain Pain Assessment: No/denies pain    Home Living                      Prior Function             PT Goals (current goals can now be found in the care plan section) Acute Rehab PT Goals Patient Stated Goal: go home w assist PT Goal Formulation: With patient Time For Goal Achievement: 02/09/20 Potential to Achieve Goals: Good Progress towards PT goals: Progressing toward goals    Frequency    Min 3X/week      PT Plan Current plan remains appropriate    Co-evaluation              AM-PAC PT "6 Clicks" Mobility   Outcome Measure  Help needed turning from your back to your side while in a flat bed without using bedrails?: None Help needed moving from lying on your back to sitting on the side of a flat bed without using bedrails?: None Help needed moving to and from a bed to a chair (including a wheelchair)?: A Little Help needed standing up from a chair using your arms (e.g., wheelchair or bedside chair)?: A Little Help needed to walk in hospital room?: A Little Help needed climbing 3-5 steps with a railing? : A Lot 6 Click Score: 19    End of Session Equipment Utilized During Treatment: Oxygen Activity Tolerance: Patient tolerated treatment well Patient left: in chair;with call bell/phone within reach;with chair alarm set Nurse Communication: Mobility status PT Visit Diagnosis: Unsteadiness on feet (R26.81);Muscle weakness (generalized) (M62.81);History of falling (Z91.81);Difficulty in walking, not elsewhere classified (R26.2)     Time: 1040-1100 PT Time Calculation (min) (ACUTE ONLY): 20 min  Charges:  $Gait Training: 8-22 mins                      Talbot Grumbling PT, DPT 01/27/20, 12:37 PM [phone]

## 2020-01-28 LAB — ACID FAST SMEAR (AFB, MYCOBACTERIA)
Acid Fast Smear: NEGATIVE
Acid Fast Smear: NEGATIVE

## 2020-01-28 LAB — CULTURE, BLOOD (ROUTINE X 2)
Culture: NO GROWTH
Culture: NO GROWTH
Special Requests: ADEQUATE
Special Requests: ADEQUATE

## 2020-02-03 DIAGNOSIS — D649 Anemia, unspecified: Secondary | ICD-10-CM | POA: Diagnosis not present

## 2020-02-04 LAB — CBC WITH DIFFERENTIAL/PLATELET
Absolute Monocytes: 805 cells/uL (ref 200–950)
Basophils Absolute: 21 cells/uL (ref 0–200)
Basophils Relative: 0.3 %
Eosinophils Absolute: 77 cells/uL (ref 15–500)
Eosinophils Relative: 1.1 %
HCT: 32.3 % — ABNORMAL LOW (ref 35.0–45.0)
Hemoglobin: 9.7 g/dL — ABNORMAL LOW (ref 11.7–15.5)
Lymphs Abs: 1666 cells/uL (ref 850–3900)
MCH: 25.3 pg — ABNORMAL LOW (ref 27.0–33.0)
MCHC: 30 g/dL — ABNORMAL LOW (ref 32.0–36.0)
MCV: 84.3 fL (ref 80.0–100.0)
MPV: 11 fL (ref 7.5–12.5)
Monocytes Relative: 11.5 %
Neutro Abs: 4431 cells/uL (ref 1500–7800)
Neutrophils Relative %: 63.3 %
Platelets: 139 10*3/uL — ABNORMAL LOW (ref 140–400)
RBC: 3.83 10*6/uL (ref 3.80–5.10)
RDW: 14 % (ref 11.0–15.0)
Total Lymphocyte: 23.8 %
WBC: 7 10*3/uL (ref 3.8–10.8)

## 2020-02-04 LAB — IRON,TIBC AND FERRITIN PANEL
%SAT: 11 % (calc) — ABNORMAL LOW (ref 16–45)
Ferritin: 92 ng/mL (ref 16–288)
Iron: 28 ug/dL — ABNORMAL LOW (ref 45–160)
TIBC: 263 mcg/dL (calc) (ref 250–450)

## 2020-02-10 ENCOUNTER — Encounter: Payer: Self-pay | Admitting: Gastroenterology

## 2020-02-10 ENCOUNTER — Other Ambulatory Visit: Payer: Self-pay

## 2020-02-10 ENCOUNTER — Ambulatory Visit: Payer: Medicare Other | Admitting: Gastroenterology

## 2020-02-10 VITALS — BP 132/64 | HR 121 | Temp 98.0°F | Ht 65.0 in | Wt 106.0 lb

## 2020-02-10 DIAGNOSIS — D649 Anemia, unspecified: Secondary | ICD-10-CM

## 2020-02-10 NOTE — Progress Notes (Signed)
Primary Care Physician: Lucianne Lei, MD  Primary Gastroenterologist:  Garfield Cornea, MD   Chief Complaint  Patient presents with  . Anemia    f/u  . Gastroesophageal Reflux    doing fine    HPI: Marilyn Solomon is a 74 y.o. female here for follow-up.  Last seen in June.  Hospitalized back in May with COVID-19 pneumonia.  Presented back to the ED shortly after that hospitalization with vomiting and diarrhea, noted drop in hemoglobin from 10.6 to 7.6.  Tested positive for C. difficile.  Stool heme positive.  We updated labs in June, hemoglobin was up from 7.2 to 9.9.  Mildly low iron sats.  Normal ferritin.  Held off on endoscopy with plans to readdress today.  Patient admitted to the hospital back in August with acute kidney injury due to dehydration and poor oral intake.  Also with acute on chronic hypoxic respiratory failure with probable community-acquired bacterial pneumonia.  Treated with Rocephin and Zithromax.  Subsequently switched to Levaquin per pulmonary recommendations.  TB Gold was indeterminate.  Pulmonary recommended AFB x3 given CT findings.  First AFB culture tested positive at 2 weeks, but DNA probe was negative for Mycobacterium tuberculosis or Mycobacterium avium.  Subsequent cultures are pending. Pulmonary follow up with Dr. [name] scheduled for next month.  Patient was seen in March 2019 for positive Cologuard test, transfusion dependent anemia, abnormal esophagus on CTA chest.  Colonoscopy in May 2019 which was unremarkable except for diverticulosis. EGD at the same time showed nonobstructing Schatzki ring, mild erosive reflux esophagitis, small hiatal hernia, gastritis but no H. pylori. Patient was advised to undergo capsule endoscopy for evaluation of anemia, but she declined.  She complains of some soreness in LUQ, has a lump there. Noted for several months. Appetite ok. BM regular. No melena, brbpr. No n/v.    She updated her labs 02/03/20. Hgb 9.7 stable.  Iron 28 down from 43, iron sat 11. Ferritin 92.   Current Outpatient Medications  Medication Sig Dispense Refill  . albuterol (VENTOLIN HFA) 108 (90 Base) MCG/ACT inhaler Inhale 1-2 puffs into the lungs every 6 (six) hours as needed for wheezing or shortness of breath.    . ALPRAZolam (XANAX) 1 MG tablet Take 0.5 tablets (0.5 mg total) by mouth every 8 (eight) hours as needed for anxiety.    . Fluticasone-Umeclidin-Vilant (TRELEGY ELLIPTA) 100-62.5-25 MCG/INH AEPB Inhale 1 puff into the lungs daily.     Marland Kitchen levothyroxine (SYNTHROID) 50 MCG tablet Take 50 mcg by mouth daily.     Marland Kitchen OVER THE COUNTER MEDICATION Over the counter cough syrup -  Take tablespoon twice daily as needed for cough    . oxyCODONE (ROXICODONE) 15 MG immediate release tablet Take 15 mg by mouth every 8 (eight) hours as needed for pain.     . pantoprazole (PROTONIX) 40 MG tablet Take 1 tablet (40 mg total) by mouth as needed (for acid reflux).    . Vitamin D, Ergocalciferol, (DRISDOL) 1.25 MG (50000 UNIT) CAPS capsule Take 50,000 Units by mouth every 7 (seven) days.    Marland Kitchen ipratropium-albuterol (DUONEB) 0.5-2.5 (3) MG/3ML SOLN Take 3 mLs by nebulization every 6 (six) hours as needed (shortness of breath or wheezing). 360 mL 3   No current facility-administered medications for this visit.    Allergies as of 02/10/2020 - Review Complete 02/10/2020  Allergen Reaction Noted  . Penicillins Itching 12/05/2013  . Vicodin [hydrocodone-acetaminophen] Nausea And Vomiting 04/17/2011    ROS:  General: Negative for anorexia, weight loss, fever, chills, fatigue, weakness. ENT: Negative for hoarseness, difficulty swallowing , nasal congestion. CV: Negative for chest pain, angina, palpitations, dyspnea on exertion, peripheral edema.  Respiratory: Negative for dyspnea at rest, dyspnea on exertion, cough, sputum, wheezing.  GI: See history of present illness. GU:  Negative for dysuria, hematuria, urinary incontinence, urinary frequency,  nocturnal urination.  Endo: Negative for unusual weight change.    Physical Examination:   BP 132/64   Pulse (!) 121   Temp 98 F (36.7 C) (Oral)   Ht 5\' 5"  (1.651 m)   Wt 106 lb (48.1 kg)   BMI 17.64 kg/m   General: Well-nourished, well-developed in no acute distress.  Eyes: No icterus. Mouth: masked Lungs: Clear to auscultation bilaterally.  Heart: Regular rate and rhythm, no murmurs rubs or gallops.  Abdomen: Bowel sounds are normal, nontender, nondistended, no hepatosplenomegaly or masses, no abdominal bruits or hernia , no rebound or guarding.  Pea sized subcutaneous nodule palpation in luq at lower rib margin Extremities: No lower extremity edema. No clubbing or deformities. Neuro: Alert and oriented x 4   Skin: Warm and dry, no jaundice.   Psych: Alert and cooperative, normal mood and affect.  Labs:  Lab Results  Component Value Date   CREATININE 0.57 01/27/2020   BUN 8 01/27/2020   NA 142 01/27/2020   K 3.2 (L) 01/27/2020   CL 99 01/27/2020   CO2 34 (H) 01/27/2020   Lab Results  Component Value Date   WBC 7.0 02/03/2020   HGB 9.7 (L) 02/03/2020   HCT 32.3 (L) 02/03/2020   MCV 84.3 02/03/2020   PLT 139 (L) 02/03/2020   Lab Results  Component Value Date   IRON 28 (L) 02/03/2020   TIBC 263 02/03/2020   FERRITIN 92 02/03/2020     Imaging Studies: CT HEAD WO CONTRAST  Result Date: 01/25/2020 CLINICAL DATA:  Mental status change EXAM: CT HEAD WITHOUT CONTRAST TECHNIQUE: Contiguous axial images were obtained from the base of the skull through the vertex without intravenous contrast. COMPARISON:  None. FINDINGS: Brain: No evidence of acute infarction, hemorrhage, hydrocephalus, extra-axial collection or mass lesion/mass effect. Vascular: Negative for hyperdense vessel Skull: Negative Sinuses/Orbits: Paranasal sinuses clear. Mastoid clear. Bilateral cataract extraction Other: None IMPRESSION: Negative CT head Electronically Signed   By: Franchot Gallo M.D.   On:  01/25/2020 19:10   CT CHEST WO CONTRAST  Result Date: 01/23/2020 CLINICAL DATA:  Cough and persistent abnormal chest x-ray. EXAM: CT CHEST WITHOUT CONTRAST TECHNIQUE: Multidetector CT imaging of the chest was performed following the standard protocol without IV contrast. COMPARISON:  06/30/2017 and multiple intervening chest x-rays. FINDINGS: Cardiovascular: Normal heart size. No pericardial effusion. Diffuse atherosclerotic calcification of the aorta and coronaries Mediastinum/Nodes: Chronic mediastinal and hilar adenopathy which is considered reactive to the pulmonary disease. Small sliding hiatal hernia. Long-standing bulla superior to the left mainstem bronchus. Lungs/Pleura: Chronic diffuse bronchitis with airway thickening and mucoid impaction/airway collapse at multiple levels. This was seen on a 2013 chest CT. There is peribronchovascular reticular and nodular densities with generalized involvement. Consolidative opacities are seen in the subpleural lower lobes. The pattern is similar to 2019 although progressive. In 2013 these findings were primarily seen at the right upper lobe where there is some honeycombing. Suspect chronic indolent infection as with mycobacterium. There could be acute pneumonia in this setting. Trace pleural effusions. No air leak. No indication of pulmonary edema. Upper Abdomen: Negative Musculoskeletal: No acute or aggressive finding.  Exaggerated thoracic kyphosis IMPRESSION: 1. Long-standing chronic bronchitis with multifocal airway collapse. 2. 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 At the lung bases there is true consolidative opacities and acute on chronic infection is possible. Aortic Atherosclerosis (ICD10-I70.0).  Coronary atherosclerosis. Electronically Signed   By: Monte Fantasia M.D.   On: 01/23/2020 05:01   NM Pulmonary Perfusion  Result Date: 01/23/2020 CLINICAL  DATA:  Evaluate for pulmonary embolus. EXAM: NUCLEAR MEDICINE PERFUSION LUNG SCAN TECHNIQUE: Perfusion images were obtained in multiple projections after intravenous injection of radiopharmaceutical. Ventilation scans intentionally deferred if perfusion scan and chest x-ray adequate for interpretation during COVID 19 epidemic. RADIOPHARMACEUTICALS:  4.4 mCi Tc-30m MAA IV COMPARISON:  Chest CT from 01/23/2020 and chest radiograph 01/22/2020 FINDINGS: Review of chest CT shows bilateral, multifocal pulmonary densities involving both the upper and lower lung zones. The airspace consolidation is noted in both lung bases and there is a small left pleural effusion. On the perfusion images there is regional decreased radiotracer within the lower lung zones bilaterally. More focal areas of decreased perfusion are identified within the right apex, anterior right upper lobe, superior segment of right lower lobe and anterior left upper lobe. IMPRESSION: 1. Findings are indeterminate. Multiple perfusion abnormalities are identified. However, In the setting of abnormal chest radiograph showing multiple bilateral pulmonary opacities findings are nonspecific and not diagnostic for the presence or absence of pulmonary embolus. If there is a continued clinical concern for pulmonary embolus further investigation with lower extremity Doppler and or CTA of the chest is recommended. Electronically Signed   By: Kerby Moors M.D.   On: 01/23/2020 08:52   DG Chest Portable 1 View  Result Date: 01/22/2020 CLINICAL DATA:  Cough EXAM: PORTABLE CHEST 1 VIEW COMPARISON:  10/07/2019, 06/21/2018, 08/12/2018 01/31/2018 chest CT 06/30/2017 FINDINGS: Coarse reticular and slightly nodular opacities throughout the bilateral lungs, chronic to recent priors from 2001, progressive as compared with more remote exams. No acute superimposed confluent airspace disease or effusion. Stable cardiomediastinal silhouette. Aortic atherosclerosis. No  pneumothorax. IMPRESSION: Chronic coarse reticular and slightly nodular opacities throughout the bilateral lungs consistent with chronic interstitial disease and possible chronic atypical or indolent infection. This is stable compared with most recent priors but is progressive as compared with more remote exams. No definite acute superimposed confluent airspace disease. Electronically Signed   By: Donavan Foil M.D.   On: 01/22/2020 16:53   Impression/plan: 74 year old female with history of acute on chronic normocytic anemia with mildly low iron and iron saturations, normal ferritin in the setting of multiple hospitalizations and illnesses this year.  Denies overt GI bleeding.  Most recent labs with mild drop in serum iron and iron saturations but maintains a normal ferritin.  Hemoglobin has been stable.EGD and colonoscopy in 2019 as previously outlined.  Cannot rule out need for updated work-up possibly capsule study.  We will follow-up her upcoming pulmonary appointment which is pending for positive AFB culture.  We will have her start back on iron pills.  Recheck her labs in 3 months.  Further recommendations to follow.  Small pea sized nodule noted in luq at lower rib margin likely benign. No significant findings on CT, patient reports lump has been there since last year sometime. Tender to touch. But given no abnormality seen on CT will continue to monitor.

## 2020-02-10 NOTE — Patient Instructions (Signed)
1. Start back on your iron pills, take per instructions on the bottle. 2. We will update your labs in 3 months for anemia and low iron. 3. I will also follow-up on how your pulmonology appointment goes next month, we can decide if you need a capsule study to further evaluate your anemia pending results from the lung doctor.

## 2020-02-12 ENCOUNTER — Encounter: Payer: Self-pay | Admitting: Gastroenterology

## 2020-02-16 DIAGNOSIS — I10 Essential (primary) hypertension: Secondary | ICD-10-CM | POA: Diagnosis not present

## 2020-02-16 DIAGNOSIS — J441 Chronic obstructive pulmonary disease with (acute) exacerbation: Secondary | ICD-10-CM | POA: Diagnosis not present

## 2020-02-16 DIAGNOSIS — E039 Hypothyroidism, unspecified: Secondary | ICD-10-CM | POA: Diagnosis not present

## 2020-02-16 DIAGNOSIS — Z23 Encounter for immunization: Secondary | ICD-10-CM | POA: Diagnosis not present

## 2020-02-16 DIAGNOSIS — I110 Hypertensive heart disease with heart failure: Secondary | ICD-10-CM | POA: Diagnosis not present

## 2020-02-16 DIAGNOSIS — W010XXA Fall on same level from slipping, tripping and stumbling without subsequent striking against object, initial encounter: Secondary | ICD-10-CM | POA: Diagnosis not present

## 2020-02-18 ENCOUNTER — Encounter: Payer: Self-pay | Admitting: Internal Medicine

## 2020-02-26 DIAGNOSIS — J441 Chronic obstructive pulmonary disease with (acute) exacerbation: Secondary | ICD-10-CM | POA: Diagnosis not present

## 2020-03-02 ENCOUNTER — Telehealth: Payer: Self-pay | Admitting: *Deleted

## 2020-03-02 DIAGNOSIS — A319 Mycobacterial infection, unspecified: Secondary | ICD-10-CM

## 2020-03-02 NOTE — Telephone Encounter (Signed)
-----   Message from Tanda Rockers, MD sent at 02/18/2020 10:01 AM EDT ----- Claud Kelp to ID for atypical Mycobacterial infection and set up f/u with me in [place] office with all meds in hand first available

## 2020-03-02 NOTE — Telephone Encounter (Signed)
Spoke with the pt and notified of recs per Dr [name]  She verbalized understanding  Her referral was placed and she is already set to see MW 03/17/20

## 2020-03-08 LAB — AFB ORGANISM ID BY DNA PROBE
M avium complex: NEGATIVE
M tuberculosis complex: NEGATIVE

## 2020-03-08 LAB — ACID FAST SMEAR (AFB, MYCOBACTERIA): Acid Fast Smear: NEGATIVE

## 2020-03-08 LAB — RAPID GROWER BROTH SUSCEP.
Amikacin: 8
Ciprofloxacin: >4
Clarithromycin: 0.5
Doxycycline: >16
Minocycline: >8
Moxifloxacin: >8
Tigecycline: 0.5

## 2020-03-08 LAB — ACID FAST CULTURE WITH REFLEXED SENSITIVITIES (MYCOBACTERIA): Acid Fast Culture: POSITIVE — AB

## 2020-03-08 LAB — ORG ID BY SEQUENCING RFLX AST

## 2020-03-10 LAB — ACID FAST CULTURE WITH REFLEXED SENSITIVITIES (MYCOBACTERIA): Acid Fast Culture: NEGATIVE

## 2020-03-11 LAB — ACID FAST CULTURE WITH REFLEXED SENSITIVITIES (MYCOBACTERIA): Acid Fast Culture: NEGATIVE

## 2020-03-15 ENCOUNTER — Ambulatory Visit: Payer: Medicare Other | Admitting: Infectious Diseases

## 2020-03-17 ENCOUNTER — Institutional Professional Consult (permissible substitution): Payer: Medicare Other | Admitting: Internal Medicine

## 2020-03-19 ENCOUNTER — Other Ambulatory Visit: Payer: Self-pay | Admitting: Internal Medicine

## 2020-03-23 ENCOUNTER — Other Ambulatory Visit (HOSPITAL_COMMUNITY): Payer: Self-pay | Admitting: Family Medicine

## 2020-03-23 ENCOUNTER — Telehealth: Payer: Self-pay | Admitting: Internal Medicine

## 2020-03-23 DIAGNOSIS — Z1231 Encounter for screening mammogram for malignant neoplasm of breast: Secondary | ICD-10-CM

## 2020-03-23 NOTE — Telephone Encounter (Signed)
Pt called saying she was out of her pantoprazole and she needs it called into [place].

## 2020-03-23 NOTE — Telephone Encounter (Signed)
Routing to [facility] Refill. Pt is taking Pantoprazole 40 mg daily.

## 2020-03-23 NOTE — Telephone Encounter (Addendum)
addressed

## 2020-03-27 DIAGNOSIS — J441 Chronic obstructive pulmonary disease with (acute) exacerbation: Secondary | ICD-10-CM | POA: Diagnosis not present

## 2020-04-07 ENCOUNTER — Other Ambulatory Visit: Payer: Self-pay

## 2020-04-07 ENCOUNTER — Ambulatory Visit (HOSPITAL_COMMUNITY)
Admission: RE | Admit: 2020-04-07 | Discharge: 2020-04-07 | Disposition: A | Discharge status: Home / Self Care | Payer: Medicare Other | Source: Ambulatory Visit | Attending: Family Medicine | Admitting: Family Medicine

## 2020-04-07 DIAGNOSIS — Z1231 Encounter for screening mammogram for malignant neoplasm of breast: Secondary | ICD-10-CM | POA: Insufficient documentation

## 2020-04-21 ENCOUNTER — Other Ambulatory Visit: Payer: Self-pay

## 2020-04-21 DIAGNOSIS — D649 Anemia, unspecified: Secondary | ICD-10-CM

## 2020-04-27 DIAGNOSIS — J441 Chronic obstructive pulmonary disease with (acute) exacerbation: Secondary | ICD-10-CM | POA: Diagnosis not present

## 2020-05-11 ENCOUNTER — Encounter: Payer: Self-pay | Admitting: Internal Medicine

## 2020-05-11 ENCOUNTER — Encounter: Payer: Self-pay | Admitting: *Deleted

## 2020-05-11 ENCOUNTER — Ambulatory Visit (HOSPITAL_COMMUNITY)
Admission: RE | Admit: 2020-05-11 | Discharge: 2020-05-11 | Disposition: A | Discharge status: Home / Self Care | Payer: Medicare Other | Source: Ambulatory Visit | Attending: Internal Medicine | Admitting: Internal Medicine

## 2020-05-11 ENCOUNTER — Ambulatory Visit: Payer: Medicare Other | Admitting: Internal Medicine

## 2020-05-11 ENCOUNTER — Other Ambulatory Visit: Payer: Self-pay

## 2020-05-11 DIAGNOSIS — A318 Other mycobacterial infections: Secondary | ICD-10-CM

## 2020-05-11 DIAGNOSIS — A319 Mycobacterial infection, unspecified: Secondary | ICD-10-CM | POA: Insufficient documentation

## 2020-05-11 DIAGNOSIS — E039 Hypothyroidism, unspecified: Secondary | ICD-10-CM | POA: Diagnosis not present

## 2020-05-11 DIAGNOSIS — J9611 Chronic respiratory failure with hypoxia: Secondary | ICD-10-CM | POA: Insufficient documentation

## 2020-05-11 DIAGNOSIS — K589 Irritable bowel syndrome without diarrhea: Secondary | ICD-10-CM | POA: Diagnosis not present

## 2020-05-11 DIAGNOSIS — J449 Chronic obstructive pulmonary disease, unspecified: Secondary | ICD-10-CM

## 2020-05-11 DIAGNOSIS — J9612 Chronic respiratory failure with hypercapnia: Secondary | ICD-10-CM

## 2020-05-11 DIAGNOSIS — D649 Anemia, unspecified: Secondary | ICD-10-CM | POA: Diagnosis not present

## 2020-05-11 MED ORDER — AZITHROMYCIN 250 MG PO TABS: ORAL_TABLET | ORAL | 5 refills | Status: DC

## 2020-05-11 NOTE — Progress Notes (Signed)
Marilyn Solomon, female    DOB: March 26, 1946,    MRN: [id number]   Brief patient profile:  62 yowf quit smoking 07/1998 at dx of L PTX which did not recur p L Chest tube not limited by breathing or coughing but around  Around 2009 with dx of copd and atypical TB by Marilyn Solomon and coughing ever since s/p admit:    Admit date: 01/22/2020 Discharge date: 01/27/2020  Admitted From: Home Disposition: Home.  Patient refused SNF placement  Recommendations for Outpatient Follow-up:  1. Follow up with PCP in 1 week with repeat CBC/BMP 2. Outpatient follow-up with pulmonary 3. Follow up in ED if symptoms worsen or new appear   Home Health: Patient refused home health services Equipment/Devices: Continue supplemental oxygen.  Patient uses oxygen via nasal cannula at 2L/min at home normally.  Discharge Condition: Guarded CODE STATUS: Full Diet recommendation: Regular  Brief/Interim Summary: 74 year old female with COPD and chronic hypoxic respiratory failure failure on 2 L oxygen,? MAI, hypothyroidism presented with worsening shortness of breath and cough. On presentation, creatinine was 1.63, baseline creatinine 0.6. Chest x-ray showed evidence of chronic changes, unable to rule out low-grade infection. CT of the chest showed long-standing chronic bronchitis with multifocal airway collapse.  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 At the lung bases there is true consolidative opacities and acute on chronic infection is possible. She was started on antibiotics. Pulmonary was consulted.  Pulmonary recommended sputum AFB testing and outpatient follow-up with pulmonary along with antibiotic treatment for total of 10 days.  AFB x1 has been negative so far; second AFB test result is pending and third sputum sample will be collected prior to discharge today.  Patient is very deconditioned.  PT  recommended SNF placement.  Patient refused SNF placement and is also not agreeable for home health services.  She will be discharged home with outpatient follow-up with PCP and pulmonary.   Discharge Diagnoses:   Acute kidney injury -Probably from dehydration and poor oral intake. Creatinine 1.63 on presentation, baseline creatinine 0.6. Treated with IV fluids. Creatinine 0.66today. No labs today. Repeat a.m. labs. -Patient was restarted on IV fluids on 01/25/2020 evening because of very poor oral intake -Creatinine stable.  Outpatient follow-up of creatinine.  Acute on chronic hypoxic respiratory failure Probable community-acquired bacterial pneumonia Chronic lung disease/? MAI -Started on Rocephin and Zithromax. Subsequently switched to Levaquin 500 mg daily as per pulmonary recommendations.  Continue Levaquin for 5 more days to complete 10-day course of therapy of antibiotics as per pulmonary recommendations.  Outpatient follow-up with pulmonary. -Pulmonary recommendedAFB x3 sputum  > 12/2819 grew M abscessus complex sensitive to macrolides     Acute metabolic encephalopathy -Patient was more drowsy on the evening of 01/25/2020 as per the nursing staff. CT of the head was negative for acute intracranial abnormality. Dose of Xanax decreased. More awake but still very slow to respond.  -PT recommended SNF placement but patient adamant about not going to SNF and also adamant that she does not any home health services  Anxiety/depression -Patient is on very high dose of Xanax 1 mg 3 times a day as needed at home. I have decreased it to 0.25 mg 3 times a day as needed.  Discharged home on 0.5 mg 3 times a day as needed.  Outpatient follow-up with PCP regarding follow-up of the same.  COPD -Continue home regimen.Outpatient follow-up with pulmonary  Hypothyroidism-continue levothyroxine   History  of Present Illness  05/11/2020  Pulmonary/ 1st office eval/ Marilyn Solomon /  Marilyn Solomon Office re f/u atypical TB  Chief Complaint  Patient presents with  . Hospitalization Follow-up    Breathing has improved since hospital d/c and she states not coughing at this time. She states using o2 "at home when I need it" and also at bedtime- 2lpm.   Dyspnea:  Not limited by breathing from desired activities  But only does light house work Cough: none presently Sleep: props 30-45 degrees with pillows, bed is flat  SABA use: trelegy daily  02  2lpm hs and prn daytime with sats 94% at rest at 92% p walking  Cramps under L HD x months, never supine - sometimes migrates to R side  No obvious day to day or daytime variability or assoc excess/ purulent sputum or mucus plugs or hemoptysis or cp or chest tightness, subjective wheeze or overt sinus or hb symptoms.   Sleeping as above  without nocturnal  or early am exacerbation  of respiratory  c/o's or need for noct saba. Also denies any obvious fluctuation of symptoms with weather or environmental changes or other aggravating or alleviating factors except as outlined above   No unusual exposure hx or h/o childhood pna/ asthma or knowledge of premature birth.  Current Allergies, Complete Past Medical History, Past Surgical History, Family History, and Social History were reviewed in [employer] record.  ROS  The following are not active complaints unless bolded Hoarseness, sore throat, dysphagia, dental problems, itching, sneezing,  nasal congestion or discharge of excess mucus or purulent secretions, ear ache,   fever, chills, sweats, unintended wt loss or wt gain, classically pleuritic or exertional cp,  orthopnea pnd or arm/hand swelling  or leg swelling, presyncope, palpitations, abdominal pain, anorexia, nausea, vomiting, diarrhea  or change in bowel habits or change in bladder habits, change in stools or change in urine, dysuria, hematuria,  rash, arthralgias, visual complaints, headache, numbness, weakness  or ataxia or problems with walking due to L hip pain  or coordination,  change in mood or  memory.            Past Medical History:  Diagnosis Date  . Anemia   . Anxiety   . Arthritis   . Chronic back pain   . COPD (chronic obstructive pulmonary disease) ([place])   . GERD (gastroesophageal reflux disease)   . Hypertension   . Hypothyroidism   . Reflux    no medications currently, asymptomatic  . Thyroid disease     Outpatient Medications Prior to Visit  Medication Sig Dispense Refill  . albuterol (VENTOLIN HFA) 108 (90 Base) MCG/ACT inhaler Inhale 1-2 puffs into the lungs every 6 (six) hours as needed for wheezing or shortness of breath.    . ALPRAZolam (XANAX) 1 MG tablet Take 0.5 tablets (0.5 mg total) by mouth every 8 (eight) hours as needed for anxiety.    . Fluticasone-Umeclidin-Vilant (TRELEGY ELLIPTA) 100-62.5-25 MCG/INH AEPB Inhale 1 puff into the lungs daily.     Marland Kitchen ipratropium-albuterol (DUONEB) 0.5-2.5 (3) MG/3ML SOLN Take 3 mLs by nebulization every 6 (six) hours as needed (shortness of breath or wheezing). 360 mL 3  . levothyroxine (SYNTHROID) 50 MCG tablet Take 50 mcg by mouth daily.     Marland Kitchen OVER THE COUNTER MEDICATION Over the counter cough syrup -  Take tablespoon twice daily as needed for cough    . oxyCODONE (ROXICODONE) 15 MG immediate release tablet Take 15 mg by  mouth every 8 (eight) hours as needed for pain.     . pantoprazole (PROTONIX) 40 MG tablet TAKE 1 TABLET BY MOUTH DAILY. 90 tablet 3  . Vitamin D, Ergocalciferol, (DRISDOL) 1.25 MG (50000 UNIT) CAPS capsule Take 50,000 Units by mouth every 7 (seven) days.     No facility-administered medications prior to visit.     Objective:     BP 138/80 (BP Location: Left Arm, Cuff Size: Normal)   Pulse (!) 102   Temp 97.8 F (36.6 C) (Temporal)   Ht 5\' 5"  (1.651 m)   Wt 106 lb 8 oz (48.3 kg)   SpO2 92% Comment: on RA  BMI 17.72 kg/m   SpO2: 92 % (on RA)   Wt Readings from Last 3 Encounters:  05/11/20 106  lb 8 oz (48.3 kg)  02/10/20 106 lb (48.1 kg)  01/22/20 114 lb (51.7 kg)     HEENT : pt wearing mask not removed for exam due to covid -19 concerns.    NECK :  without JVD/Nodes/TM/ nl carotid upstrokes bilaterally   LUNGS: no acc muscle use,  Mod barrel  contour chest wall with bilateral  Distant bs s audible wheeze and  without cough on insp or exp maneuvers and mod  Hyperresonant  to  percussion bilaterally     CV:  RRR  no s3 or murmur or increase in P2, and no edema   ABD:  soft and nontender with pos mid insp Hoover's  in the supine position. No bruits or organomegaly appreciated, bowel sounds nl  MS:   Walks with limp  ext warm without deformities, calf tenderness, cyanosis or clubbing No obvious joint restrictions   SKIN: warm and dry without lesions    NEURO:  alert, approp, nl sensorium with  no motor or cerebellar deficits apparent.       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         COPD GOLD  ?  Quit smoking 2000 - maint on trelegy as of 05/11/2020  - Labs rec 05/11/2020  :     alpha one AT phenotype/ Quant Ig's   > declined going to lab today  - The proper method of use, as well as anticipated side effects, of a metered-dose inhaler and elipta were discussed and demonstrated to the patient using  teach back method     Group D in terms of symptom/risk and laba/lama/ICS  therefore appropriate rx at this point >>>  Continue trelegy and prn saba  I spent extra time with pt today reviewing appropriate use of albuterol for prn use on exertion with the following points: 1) saba is for relief of sob that does not improve by walking a slower  pace or resting but rather if the pt does not improve after trying this first. 2) If the pt is convinced, as many are, that saba helps recover from activity faster then it's easy to tell if this is the case by re-challenging : ie stop, take the inhaler, then p 5 minutes try the exact same activity (intensity of workload) that just caused the symptoms and see if they are substantially diminished or not after saba 3) if there is an activity that reproducibly causes the symptoms, try the saba 15 min before the activity on alternate days   If in fact the saba really does help, then fine to continue to use it prn but advised may need to look closer at the maintenance regimen being used to achieve better control of airways disease with exertion.      Chronic respiratory failure with hypoxia and hypercapnia (HCC) HCO3  01/27/20   = 34  - as of 05/11/2020  2lpm hs and prn daytime   Bicarb correlates to pC02 in 50s but well compensated at present and ok to titrate daytime for low 90s as a target      Irritable bowel syndrome with atypical cp  Classic LUQ > RUQ migratory pain syndrome always resolves supine reported 05/11/2020 > rx citucel   Classic subdiaphragmatic pain pattern suggests ibs:  Stereotypical, migratory with a very limited distribution of pain locations, daytime, not usually exacerbated by exercise  or coughing, worse in sitting position, frequently associated with generalized abd bloating, not as likely to be present supine due to the dome effect of the diaphragm which  is  canceled in that position. Frequently these patients have had multiple  negative GI workups and CT scans.  Treatment consists of avoiding foods that cause gas (especially boiled eggs, mexcican food but especially  beans and undercooked vegetables like  spinach and some salads)  and citrucel 1 heaping tsp twice daily with a large glass of water.  Pain should improve w/in 2 weeks and if not then consider further GI work up.      Each maintenance medication was reviewed in detail including emphasizing most importantly the difference between maintenance and prns and under what circumstances the prns are to be triggered using an action plan format where appropriate.  Total time for H and P, chart review, counseling, teaching device and generating customized AVS unique to this 1st office visit for pt already seen in [facility]  / charting  > 40 min          Christinia Gully, MD 05/11/2020

## 2020-05-11 NOTE — Assessment & Plan Note (Signed)
HCO3  01/27/20   = 34  - as of 05/11/2020  2lpm hs and prn daytime   Bicarb correlates to pC02 in 50s but well compensated at present and ok to titrate daytime for low 90s as a target    Each maintenance medication was reviewed in detail including emphasizing most importantly the difference between maintenance and prns and under what circumstances the prns are to be triggered using an action plan format where appropriate.  Total time for H and P, chart review, counseling, teaching device and generating customized AVS unique to this 1st office visit for pt already seen in [facility]  / charting  > 40 min

## 2020-05-11 NOTE — Assessment & Plan Note (Signed)
M Abscessus on sputum culture 01/24/20 sensitive to macrolides    - as of 05/11/2020 rec zpak prn flare and f/u q 3 m  Clearly improved p rx with levaquin though the atypical tb component is not even sensitive so the problem must have been opportunistic gnrs  rec 1st try zpak for flares, then back up with levaquin and if progression then recheck sputum and refer to ID  Discussed in detail all the  indications, usual  risks and alternatives  relative to the benefits with patient who agrees to proceed with Rx as outlined.

## 2020-05-11 NOTE — Patient Instructions (Addendum)
We will pull Dr Luan Pulling last couple of visits with you from the warehouse and pfts if available    For nasty mucus > try zpak  - call if mucus doesn't turn clear after a week   Classic subdiaphragmatic pain pattern suggests ibs:  Stereotypical, migratory with a very limited distribution of pain locations, daytime, not usually exacerbated by exercise  or coughing, worse in sitting position, frequently associated with generalized abd bloating, not as likely to be present supine due to the dome effect of the diaphragm which  is  canceled in that position. Frequently these patients have had multiple negative GI workups and CT scans.  Treatment consists of avoiding foods that cause gas (especially boiled eggs, mexcican food but especially  beans and undercooked vegetables like  spinach and some salads)  and citrucel 1 heaping tsp twice daily with a large glass of water.  Pain should improve w/in 2 weeks and if not then consider further GI work up.     Please remember to go to the  x-ray department  @  [facility] for your tests - we will call you with the results when they are available      We will call to refer you to Internal Medicine in [place]    Please schedule a follow up visit in 3 months with pfts same day if possible  -need  alpha one blood test next time you have your blood drawn - plus needs quant Ig's

## 2020-05-11 NOTE — Assessment & Plan Note (Signed)
Classic LUQ > RUQ migratory pain syndrome always resolves supine reported 05/11/2020 > rx citucel   Classic subdiaphragmatic pain pattern suggests ibs:  Stereotypical, migratory with a very limited distribution of pain locations, daytime, not usually exacerbated by exercise  or coughing, worse in sitting position, frequently associated with generalized abd bloating, not as likely to be present supine due to the dome effect of the diaphragm which  is  canceled in that position. Frequently these patients have had multiple negative GI workups and CT scans.  Treatment consists of avoiding foods that cause gas (especially boiled eggs, mexcican food but especially  beans and undercooked vegetables like  spinach and some salads)  and citrucel 1 heaping tsp twice daily with a large glass of water.  Pain should improve w/in 2 weeks and if not then consider further GI work up.

## 2020-05-11 NOTE — Assessment & Plan Note (Addendum)
Quit smoking 2000 - maint on trelegy as of 05/11/2020  - Labs rec 05/11/2020  :     alpha one AT phenotype/ Quant Ig's   > declined going to lab today  - The proper method of use, as well as anticipated side effects, of a metered-dose inhaler and elipta were discussed and demonstrated to the patient using teach back method     Group D in terms of symptom/risk and laba/lama/ICS  therefore appropriate rx at this point >>>  Continue trelegy and prn saba  I spent extra time with pt today reviewing appropriate use of albuterol for prn use on exertion with the following points: 1) saba is for relief of sob that does not improve by walking a slower pace or resting but rather if the pt does not improve after trying this first. 2) If the pt is convinced, as many are, that saba helps recover from activity faster then it's easy to tell if this is the case by re-challenging : ie stop, take the inhaler, then p 5 minutes try the exact same activity (intensity of workload) that just caused the symptoms and see if they are substantially diminished or not after saba 3) if there is an activity that reproducibly causes the symptoms, try the saba 15 min before the activity on alternate days   If in fact the saba really does help, then fine to continue to use it prn but advised may need to look closer at the maintenance regimen being used to achieve better control of airways disease with exertion.

## 2020-05-12 ENCOUNTER — Telehealth: Payer: Self-pay

## 2020-05-12 LAB — CBC WITH DIFFERENTIAL/PLATELET
Absolute Monocytes: 646 cells/uL (ref 200–950)
Basophils Absolute: 28 cells/uL (ref 0–200)
Basophils Relative: 0.4 %
Eosinophils Absolute: 277 cells/uL (ref 15–500)
Eosinophils Relative: 3.9 %
HCT: 35.1 % (ref 35.0–45.0)
Hemoglobin: 11 g/dL — ABNORMAL LOW (ref 11.7–15.5)
Lymphs Abs: 1761 cells/uL (ref 850–3900)
MCH: 25.5 pg — ABNORMAL LOW (ref 27.0–33.0)
MCHC: 31.3 g/dL — ABNORMAL LOW (ref 32.0–36.0)
MCV: 81.4 fL (ref 80.0–100.0)
MPV: 10.2 fL (ref 7.5–12.5)
Monocytes Relative: 9.1 %
Neutro Abs: 4388 cells/uL (ref 1500–7800)
Neutrophils Relative %: 61.8 %
Platelets: 244 10*3/uL (ref 140–400)
RBC: 4.31 10*6/uL (ref 3.80–5.10)
RDW: 12.8 % (ref 11.0–15.0)
Total Lymphocyte: 24.8 %
WBC: 7.1 10*3/uL (ref 3.8–10.8)

## 2020-05-12 LAB — IRON,TIBC AND FERRITIN PANEL
%SAT: 7 % (calc) — ABNORMAL LOW (ref 16–45)
Ferritin: 33 ng/mL (ref 16–288)
Iron: 25 ug/dL — ABNORMAL LOW (ref 45–160)
TIBC: 352 mcg/dL (calc) (ref 250–450)

## 2020-05-12 NOTE — Telephone Encounter (Signed)
Called and talked with Kathline Magic, sister and she will tell [facility] to call us and make appt per Dr [name] in incoming referral.

## 2020-05-17 DIAGNOSIS — J449 Chronic obstructive pulmonary disease, unspecified: Secondary | ICD-10-CM | POA: Diagnosis not present

## 2020-05-17 DIAGNOSIS — E039 Hypothyroidism, unspecified: Secondary | ICD-10-CM | POA: Diagnosis not present

## 2020-05-17 DIAGNOSIS — I10 Essential (primary) hypertension: Secondary | ICD-10-CM | POA: Diagnosis not present

## 2020-05-17 DIAGNOSIS — J45909 Unspecified asthma, uncomplicated: Secondary | ICD-10-CM | POA: Diagnosis not present

## 2020-05-20 ENCOUNTER — Other Ambulatory Visit: Payer: Self-pay

## 2020-05-20 DIAGNOSIS — D649 Anemia, unspecified: Secondary | ICD-10-CM

## 2020-05-27 DIAGNOSIS — J441 Chronic obstructive pulmonary disease with (acute) exacerbation: Secondary | ICD-10-CM | POA: Diagnosis not present

## 2020-05-28 DIAGNOSIS — I10 Essential (primary) hypertension: Secondary | ICD-10-CM | POA: Diagnosis not present

## 2020-05-28 DIAGNOSIS — E039 Hypothyroidism, unspecified: Secondary | ICD-10-CM | POA: Diagnosis not present

## 2020-05-28 DIAGNOSIS — J45909 Unspecified asthma, uncomplicated: Secondary | ICD-10-CM | POA: Diagnosis not present

## 2020-05-28 DIAGNOSIS — J449 Chronic obstructive pulmonary disease, unspecified: Secondary | ICD-10-CM | POA: Diagnosis not present

## 2020-06-03 ENCOUNTER — Encounter: Payer: Self-pay | Admitting: Internal Medicine

## 2020-06-03 ENCOUNTER — Other Ambulatory Visit: Payer: Self-pay

## 2020-06-03 ENCOUNTER — Ambulatory Visit (INDEPENDENT_AMBULATORY_CARE_PROVIDER_SITE_OTHER): Payer: Medicare Other | Admitting: Internal Medicine

## 2020-06-03 VITALS — BP 145/81 | HR 100 | Temp 98.1°F | Resp 18 | Ht 65.0 in | Wt 108.0 lb

## 2020-06-03 DIAGNOSIS — J9611 Chronic respiratory failure with hypoxia: Secondary | ICD-10-CM

## 2020-06-03 DIAGNOSIS — R748 Abnormal levels of other serum enzymes: Secondary | ICD-10-CM | POA: Diagnosis not present

## 2020-06-03 DIAGNOSIS — E876 Hypokalemia: Secondary | ICD-10-CM | POA: Diagnosis not present

## 2020-06-03 DIAGNOSIS — H93231 Hyperacusis, right ear: Secondary | ICD-10-CM

## 2020-06-03 DIAGNOSIS — D649 Anemia, unspecified: Secondary | ICD-10-CM | POA: Diagnosis not present

## 2020-06-03 DIAGNOSIS — K219 Gastro-esophageal reflux disease without esophagitis: Secondary | ICD-10-CM

## 2020-06-03 DIAGNOSIS — R221 Localized swelling, mass and lump, neck: Secondary | ICD-10-CM

## 2020-06-03 DIAGNOSIS — R739 Hyperglycemia, unspecified: Secondary | ICD-10-CM

## 2020-06-03 DIAGNOSIS — E039 Hypothyroidism, unspecified: Secondary | ICD-10-CM

## 2020-06-03 DIAGNOSIS — F419 Anxiety disorder, unspecified: Secondary | ICD-10-CM

## 2020-06-03 DIAGNOSIS — J9612 Chronic respiratory failure with hypercapnia: Secondary | ICD-10-CM | POA: Diagnosis not present

## 2020-06-03 DIAGNOSIS — J449 Chronic obstructive pulmonary disease, unspecified: Secondary | ICD-10-CM

## 2020-06-03 DIAGNOSIS — Z7689 Persons encountering health services in other specified circumstances: Secondary | ICD-10-CM

## 2020-06-03 DIAGNOSIS — D509 Iron deficiency anemia, unspecified: Secondary | ICD-10-CM | POA: Diagnosis not present

## 2020-06-03 DIAGNOSIS — I10 Essential (primary) hypertension: Secondary | ICD-10-CM

## 2020-06-03 DIAGNOSIS — A319 Mycobacterial infection, unspecified: Secondary | ICD-10-CM | POA: Diagnosis not present

## 2020-06-03 DIAGNOSIS — A318 Other mycobacterial infections: Secondary | ICD-10-CM

## 2020-06-03 MED ORDER — AMLODIPINE BESYLATE 5 MG PO TABS: 5.0000 mg | ORAL_TABLET | Freq: Every day | ORAL | 1 refills | Status: DC

## 2020-06-03 MED ORDER — ALBUTEROL SULFATE HFA 108 (90 BASE) MCG/ACT IN AERS: 1.0000 | INHALATION_SPRAY | Freq: Four times a day (QID) | RESPIRATORY_TRACT | 2 refills | Status: DC | PRN

## 2020-06-03 NOTE — Assessment & Plan Note (Signed)
Lab Results  Component Value Date   TSH 3.260 01/26/2020   Takes Levothyroxine 50 mcg QD Has neck mass, suspicious for thyroid mass - check US thyroid

## 2020-06-03 NOTE — Assessment & Plan Note (Signed)
On Trelegy Albuterol PRN Uses home O2, 2 lpm Follows up with Dr [name]

## 2020-06-03 NOTE — Assessment & Plan Note (Signed)
On 2 l O2 at home Due to COPD Follows up with Dr [name]

## 2020-06-03 NOTE — Assessment & Plan Note (Signed)
Takes Alprazolam 0.5 mg TID PRN

## 2020-06-03 NOTE — Assessment & Plan Note (Signed)
On Pantoprazole 

## 2020-06-03 NOTE — Assessment & Plan Note (Addendum)
Acute on chronic hearing difficulty Has had eardrum problem in the past No fever, chills, or ear discharge currently Referred to ENT

## 2020-06-03 NOTE — Assessment & Plan Note (Addendum)
BP Readings from Last 1 Encounters:  06/03/20 (!) 145/81   Was on medication in the past Started Amlodipine 5 mg QD Counseled for compliance with the medications Advised DASH diet and moderate exercise/walking, at least 150 mins/week

## 2020-06-03 NOTE — Patient Instructions (Addendum)
Please get Ultrasound of the thyroid done as scheduled.  Please continue to use inhalers regularly and continue to use home oxygen.  Please start taking Amlodipine as prescribed for hypertension.  Please follow up with ENT Specialist as scheduled.  Okay to use Mucinex for cough.

## 2020-06-03 NOTE — Assessment & Plan Note (Signed)
S/p Azithromycin Breathing better now Has Azithromycin PRN Rx

## 2020-06-03 NOTE — Progress Notes (Signed)
New Patient Office Visit  Subjective:  Patient ID: Marilyn Solomon, female    DOB: 1946-05-25  Age: 75 y.o. MRN: [id number]  CC:  Chief Complaint  Patient presents with  . New Patient (Initial Visit)    New patient thinks thyroid is swollen has been horse since December and had a itchy throat right ear has been bothering her started about the same time throat got itchy     HPI Marilyn Solomon is a 75 year old female with PMH of COPD, chronic hypoxic respiratory failure on home O2, HTN, anxiety, hypothyroidism, chronic back and hip pain and anemia who presents for establishing care.  Patient complains of noticing a neck mass on the right side adjacent to the thyroid and she has been having hoarse voice for last 1 month.  She denies any dysphagia or odynophagia.  She also complains of right ear pain, but denies any discharge or tinnitus.  Of note, she has history of chronic hearing problem on the right side.  Patient's blood pressure was 167/84, which was found to be 145/81 upon repeat check.  She denies any dizziness, chest pain or palpitations.  She was on her blood pressure medication in the past, but it was discontinued after a hospital admission.  She is unsure of the medication name.  Patient takes alprazolam for anxiety.  She has been taking oxycodone for her chronic low back and hip pain.  Patient had Mycobacterium pneumonia recently and was treated with azithromycin.  She follows up with Dr. [name] for COPD management.  She takes Trelegy and as needed albuterol inhaler.  She uses 2 LPM home O2 for chronic hypoxic respiratory failure.  She denies excessive dyspnea or wheezing today.  Last colonoscopy in 09/2017.  Last mammography in 03/2020.  Patient has had 2 doses of COVID vaccine and flu vaccine.  Past Medical History:  Diagnosis Date  . Anemia   . Anxiety   . Arthritis   . Chronic back pain   . COPD (chronic obstructive pulmonary disease) ([place])   . GERD  (gastroesophageal reflux disease)   . HCAP (healthcare-associated pneumonia) 06/28/2017  . Hypertension   . Hypothyroidism   . Reflux    no medications currently, asymptomatic  . Thyroid disease     Past Surgical History:  Procedure Laterality Date  . ABDOMINAL HYSTERECTOMY    . BIOPSY  10/08/2017   Procedure: BIOPSY;  Surgeon: Daneil Dolin, MD;  [facility];  Service: Endoscopy;;  gastric  . cataracts Bilateral   . COLONOSCOPY  2007   Dr. Gala Romney: normal rectum, diverticula   . COLONOSCOPY WITH PROPOFOL N/A 11/15/2015   Dr. Gala Romney: grade II hemorrhoids, diverticulosis  . COLONOSCOPY WITH PROPOFOL N/A 10/08/2017   Rourk: Diverticulosis  . ESOPHAGOGASTRODUODENOSCOPY (EGD) WITH PROPOFOL N/A 10/08/2017   Rourk: Nonobstructing Schatzki ring, mild erosive reflux esophagitis, small hiatal hernia, gastritis but no H. pylori.  . FRACTURE SURGERY Right 2005   Wrist  . JOINT REPLACEMENT Left 2000   hip  . TOTAL HIP ARTHROPLASTY Left   . WRIST SURGERY Right     Family History  Problem Relation Age of Onset  . Bone cancer Sister   . Pancreatic cancer Sister   . Pneumonia Mother   . Heart attack Father   . Diabetes Sister   . Dementia Sister   . Colon cancer Neg Hx     Social History   Socioeconomic History  . Marital status: Widowed    Spouse name: Not  on file  . Number of children: Not on file  . Years of education: Not on file  . Highest education level: Not on file  Occupational History  . Occupation: disability  Tobacco Use  . Smoking status: Former Smoker    Packs/day: 25.00    Years: 1.00    Pack years: 25.00    Types: Cigarettes    Quit date: 08/27/1998    Years since quitting: 21.7  . Smokeless tobacco: Never Used  . Tobacco comment: quit in 2000  Vaping Use  . Vaping Use: Never used  Substance and Sexual Activity  . Alcohol use: No    Alcohol/week: 0.0 standard drinks  . Drug use: No  . Sexual activity: Yes    Birth control/protection: Surgical   Other Topics Concern  . Not on file  Social History Narrative  . Not on file   Social Determinants of Health   Financial Resource Strain: Not on file  Food Insecurity: Not on file  Transportation Needs: Not on file  Physical Activity: Not on file  Stress: Not on file  Social Connections: Not on file  Intimate Partner Violence: Not on file    ROS Review of Systems  Constitutional: Negative for chills and fever.  HENT: Positive for ear pain, hearing loss and voice change. Negative for congestion, sinus pressure, sinus pain and sore throat.   Eyes: Negative for pain and discharge.  Respiratory: Positive for cough. Negative for shortness of breath.   Cardiovascular: Negative for chest pain and palpitations.  Gastrointestinal: Negative for abdominal pain, constipation, diarrhea, nausea and vomiting.  Endocrine: Negative for polydipsia and polyuria.  Genitourinary: Negative for dysuria and hematuria.  Musculoskeletal: Negative for neck pain and neck stiffness.  Skin: Negative for rash.  Neurological: Negative for dizziness and weakness.  Psychiatric/Behavioral: Negative for agitation, behavioral problems, dysphoric mood and suicidal ideas. The patient is nervous/anxious. The patient is not hyperactive.     Objective:   Today's Vitals: BP (!) 145/81 (BP Location: Left Arm, Patient Position: Sitting)   Pulse 100   Temp 98.1 F (36.7 C) (Oral)   Resp 18   Ht _0  (1.651 m)   Wt 108 lb (49 kg)   SpO2 97%   BMI 17.97 kg/m   Physical Exam Vitals reviewed.  Constitutional:      General: She is not in acute distress.    Appearance: She is not diaphoretic.  HENT:     Head: Normocephalic and atraumatic.     Nose: Nose normal.     Mouth/Throat:     Mouth: Mucous membranes are moist.  Eyes:     General: No scleral icterus.    Extraocular Movements: Extraocular movements intact.     Pupils: Pupils are equal, round, and reactive to light.  Neck:     Comments: Right sided  neck mass likely thyroid nodule, nontender Cardiovascular:     Rate and Rhythm: Normal rate and regular rhythm.     Pulses: Normal pulses.     Heart sounds: Normal heart sounds. No murmur heard.   Pulmonary:     Breath sounds: Normal breath sounds. No wheezing or rales.     Comments: On 2 lpm O2 Abdominal:     Palpations: Abdomen is soft.     Tenderness: There is no abdominal tenderness.  Musculoskeletal:     Cervical back: No tenderness.     Right lower leg: No edema.     Left lower leg: No edema.  Skin:  General: Skin is warm.     Findings: No rash.  Neurological:     General: No focal deficit present.     Mental Status: She is alert and oriented to person, place, and time.     Sensory: No sensory deficit.     Motor: No weakness.  Psychiatric:        Mood and Affect: Mood normal.        Behavior: Behavior normal.     Assessment & Plan:   Problem List Items Addressed This Visit      Cardiovascular and Mediastinum   Hypertension    BP Readings from Last 1 Encounters:  06/03/20 (!) 145/81   Was on medication in the past Started Amlodipine 5 mg QD Counseled for compliance with the medications Advised DASH diet and moderate exercise/walking, at least 150 mins/week       Relevant Medications   amLODipine (NORVASC) 5 MG tablet   Other Relevant Orders   CMP14+EGFR   Hemoglobin A1c   Lipid Profile     Respiratory   COPD GOLD  ?     On Trelegy Albuterol PRN Uses home O2, 2 lpm Follows up with Dr [name]      Relevant Medications   albuterol (VENTOLIN HFA) 108 (90 Base) MCG/ACT inhaler   Chronic respiratory failure with hypoxia and hypercapnia (HCC)    On 2 l O2 at home Due to COPD Follows up with Dr [name]        Digestive   GERD (gastroesophageal reflux disease) (Chronic)    On Pantoprazole        Endocrine   Hypothyroidism (Chronic)    Lab Results  Component Value Date   TSH 3.260 01/26/2020   Takes Levothyroxine 50 mcg QD Has neck mass,  suspicious for thyroid mass - check US thyroid      Relevant Orders   US THYROID   TSH + free T4     Nervous and Auditory   Hearing abnormally acute, right    Acute on chronic hearing difficulty Has had eardrum problem in the past No fever, chills, or ear discharge currently Referred to ENT      Relevant Orders   Ambulatory referral to ENT     Other   Anxiety (Chronic)    Takes Alprazolam 0.5 mg TID PRN      Hyperglycemia - Primary   Relevant Orders   Microalbumin, urine   Mycobacterium abscessus infection    S/p Azithromycin Breathing better now Has Azithromycin PRN Rx      Neck mass    Soft tissue mass, on right side, likely thyroid mass Has hoarseness Check TSH, thyroid US Referred to ENT      Relevant Orders   Ambulatory referral to ENT   TSH + free T4    Other Visit Diagnoses    Encounter to establish care       Relevant Orders   CBC with Differential/Platelet   CMP14+EGFR   Hemoglobin A1c   TSH + free T4   Vitamin D (25 hydroxy)   Lipid Profile      Outpatient Encounter Medications as of 06/03/2020  Medication Sig  . ALPRAZolam (XANAX) 1 MG tablet Take 0.5 tablets (0.5 mg total) by mouth every 8 (eight) hours as needed for anxiety.  Marland Kitchen amLODipine (NORVASC) 5 MG tablet Take 1 tablet (5 mg total) by mouth daily.  Marland Kitchen azithromycin (ZITHROMAX) 250 MG tablet Take 2 on day one then 1 daily x 4 days  .  Fluticasone-Umeclidin-Vilant (TRELEGY ELLIPTA) 100-62.5-25 MCG/INH AEPB Inhale 1 puff into the lungs daily.   Marland Kitchen levothyroxine (SYNTHROID) 50 MCG tablet Take 50 mcg by mouth daily.   Marland Kitchen OVER THE COUNTER MEDICATION Over the counter cough syrup -  Take tablespoon twice daily as needed for cough  . oxyCODONE (ROXICODONE) 15 MG immediate release tablet Take 15 mg by mouth every 8 (eight) hours as needed for pain.   . pantoprazole (PROTONIX) 40 MG tablet TAKE 1 TABLET BY MOUTH DAILY.  Marland Kitchen Vitamin D, Ergocalciferol, (DRISDOL) 1.25 MG (50000 UNIT) CAPS capsule Take  50,000 Units by mouth every 7 (seven) days.  . [DISCONTINUED] albuterol (VENTOLIN HFA) 108 (90 Base) MCG/ACT inhaler Inhale 1-2 puffs into the lungs every 6 (six) hours as needed for wheezing or shortness of breath.  Marland Kitchen albuterol (VENTOLIN HFA) 108 (90 Base) MCG/ACT inhaler Inhale 1-2 puffs into the lungs every 6 (six) hours as needed for wheezing or shortness of breath.  Marland Kitchen ipratropium-albuterol (DUONEB) 0.5-2.5 (3) MG/3ML SOLN Take 3 mLs by nebulization every 6 (six) hours as needed (shortness of breath or wheezing).   No facility-administered encounter medications on file as of 06/03/2020.    Follow-up: Return in about 3 months (around 09/01/2020).   Lindell Spar, MD

## 2020-06-03 NOTE — Assessment & Plan Note (Signed)
Soft tissue mass, on right side, likely thyroid mass Has hoarseness Check TSH, thyroid US Referred to ENT

## 2020-06-04 ENCOUNTER — Other Ambulatory Visit: Payer: Self-pay | Admitting: Internal Medicine

## 2020-06-04 DIAGNOSIS — E039 Hypothyroidism, unspecified: Secondary | ICD-10-CM

## 2020-06-04 LAB — LIPID PANEL
Chol/HDL Ratio: 4.6 ratio — ABNORMAL HIGH (ref 0.0–4.4)
Cholesterol, Total: 190 mg/dL (ref 100–199)
HDL: 41 mg/dL (ref >39)
LDL Chol Calc (NIH): 121 mg/dL — ABNORMAL HIGH (ref 0–99)
Triglycerides: 157 mg/dL — ABNORMAL HIGH (ref 0–149)
VLDL Cholesterol Cal: 28 mg/dL (ref 5–40)

## 2020-06-04 LAB — CBC WITH DIFFERENTIAL/PLATELET
Basophils Absolute: 0 10*3/uL (ref 0.0–0.2)
Basos: 1 %
EOS (ABSOLUTE): 0.4 10*3/uL (ref 0.0–0.4)
Eos: 4 %
Hematocrit: 34.9 % (ref 34.0–46.6)
Hemoglobin: 10.6 g/dL — ABNORMAL LOW (ref 11.1–15.9)
Immature Grans (Abs): 0 10*3/uL (ref 0.0–0.1)
Immature Granulocytes: 0 %
Lymphocytes Absolute: 1.9 10*3/uL (ref 0.7–3.1)
Lymphs: 23 %
MCH: 24.5 pg — ABNORMAL LOW (ref 26.6–33.0)
MCHC: 30.4 g/dL — ABNORMAL LOW (ref 31.5–35.7)
MCV: 81 fL (ref 79–97)
Monocytes Absolute: 0.6 10*3/uL (ref 0.1–0.9)
Monocytes: 7 %
Neutrophils Absolute: 5.2 10*3/uL (ref 1.4–7.0)
Neutrophils: 65 %
Platelets: 250 10*3/uL (ref 150–450)
RBC: 4.32 x10E6/uL (ref 3.77–5.28)
RDW: 13.3 % (ref 11.7–15.4)
WBC: 8 10*3/uL (ref 3.4–10.8)

## 2020-06-04 LAB — CMP14+EGFR
ALT: 7 IU/L (ref 0–32)
AST: 16 IU/L (ref 0–40)
Albumin/Globulin Ratio: 1.2 (ref 1.2–2.2)
Albumin: 4.2 g/dL (ref 3.7–4.7)
Alkaline Phosphatase: 90 IU/L (ref 44–121)
BUN/Creatinine Ratio: 25 (ref 12–28)
BUN: 22 mg/dL (ref 8–27)
Bilirubin Total: 0.2 mg/dL (ref 0.0–1.2)
CO2: 29 mmol/L (ref 20–29)
Calcium: 9.2 mg/dL (ref 8.7–10.3)
Chloride: 99 mmol/L (ref 96–106)
Creatinine, Ser: 0.89 mg/dL (ref 0.57–1.00)
GFR calc Af Amer: 74 mL/min/{1.73_m2} (ref >59)
GFR calc non Af Amer: 65 mL/min/{1.73_m2} (ref >59)
Globulin, Total: 3.5 g/dL (ref 1.5–4.5)
Glucose: 107 mg/dL — ABNORMAL HIGH (ref 65–99)
Potassium: 4.9 mmol/L (ref 3.5–5.2)
Sodium: 142 mmol/L (ref 134–144)
Total Protein: 7.7 g/dL (ref 6.0–8.5)

## 2020-06-04 LAB — TSH+FREE T4
Free T4: 1.4 ng/dL (ref 0.82–1.77)
TSH: 6.15 u[IU]/mL — ABNORMAL HIGH (ref 0.450–4.500)

## 2020-06-04 LAB — VITAMIN D 25 HYDROXY (VIT D DEFICIENCY, FRACTURES): Vit D, 25-Hydroxy: 54.3 ng/mL (ref 30.0–100.0)

## 2020-06-04 LAB — HEMOGLOBIN A1C
Est. average glucose Bld gHb Est-mCnc: 126 mg/dL
Hgb A1c MFr Bld: 6 % — ABNORMAL HIGH (ref 4.8–5.6)

## 2020-06-04 MED ORDER — LEVOTHYROXINE SODIUM 75 MCG PO TABS
75.0000 ug | ORAL_TABLET | Freq: Every day | ORAL | 0 refills | Status: DC
Start: 2020-06-04 — End: 2020-09-23

## 2020-06-05 LAB — MICROALBUMIN, URINE: Microalbumin, Urine: 24 ug/mL

## 2020-06-07 ENCOUNTER — Ambulatory Visit (HOSPITAL_COMMUNITY)
Admission: RE | Admit: 2020-06-07 | Discharge: 2020-06-07 | Disposition: A | Discharge status: Home / Self Care | Payer: Medicare Other | Source: Ambulatory Visit | Attending: Internal Medicine | Admitting: Internal Medicine

## 2020-06-07 ENCOUNTER — Other Ambulatory Visit: Payer: Self-pay | Admitting: Internal Medicine

## 2020-06-07 ENCOUNTER — Other Ambulatory Visit: Payer: Self-pay

## 2020-06-07 DIAGNOSIS — E079 Disorder of thyroid, unspecified: Secondary | ICD-10-CM

## 2020-06-07 DIAGNOSIS — E039 Hypothyroidism, unspecified: Secondary | ICD-10-CM | POA: Insufficient documentation

## 2020-06-07 DIAGNOSIS — E041 Nontoxic single thyroid nodule: Secondary | ICD-10-CM | POA: Diagnosis not present

## 2020-06-10 ENCOUNTER — Other Ambulatory Visit: Payer: Self-pay

## 2020-06-10 ENCOUNTER — Encounter: Payer: Self-pay | Admitting: "Endocrinology

## 2020-06-10 ENCOUNTER — Ambulatory Visit: Payer: Medicare Other | Admitting: "Endocrinology

## 2020-06-10 VITALS — BP 164/70 | HR 96 | Ht 65.0 in | Wt 108.6 lb

## 2020-06-10 DIAGNOSIS — E042 Nontoxic multinodular goiter: Secondary | ICD-10-CM | POA: Insufficient documentation

## 2020-06-10 DIAGNOSIS — E039 Hypothyroidism, unspecified: Secondary | ICD-10-CM | POA: Diagnosis not present

## 2020-06-10 NOTE — Progress Notes (Signed)
Endocrinology Consult Note                                            06/10/2020, 5:31 PM   Subjective:    Patient ID: Marilyn Solomon, female    DOB: 1945-08-10, PCP Marilyn Spar, MD   Past Medical History:  Diagnosis Date  . Anemia   . Anxiety   . Arthritis   . Chronic back pain   . COPD (chronic obstructive pulmonary disease) ([place])   . GERD (gastroesophageal reflux disease)   . HCAP (healthcare-associated pneumonia) 06/28/2017  . Hypertension   . Hypothyroidism   . Reflux    no medications currently, asymptomatic  . Thyroid disease    Past Surgical History:  Procedure Laterality Date  . ABDOMINAL HYSTERECTOMY    . BIOPSY  10/08/2017   Procedure: BIOPSY;  Surgeon: Daneil Dolin, MD;  [facility];  Service: Endoscopy;;  gastric  . cataracts Bilateral   . COLONOSCOPY  2007   Dr. Gala Romney: normal rectum, diverticula   . COLONOSCOPY WITH PROPOFOL N/A 11/15/2015   Dr. Gala Romney: grade II hemorrhoids, diverticulosis  . COLONOSCOPY WITH PROPOFOL N/A 10/08/2017   Rourk: Diverticulosis  . ESOPHAGOGASTRODUODENOSCOPY (EGD) WITH PROPOFOL N/A 10/08/2017   Rourk: Nonobstructing Schatzki ring, mild erosive reflux esophagitis, small hiatal hernia, gastritis but no H. pylori.  . FRACTURE SURGERY Right 2005   Wrist  . JOINT REPLACEMENT Left 2000   hip  . TOTAL HIP ARTHROPLASTY Left   . WRIST SURGERY Right    Social History   Socioeconomic History  . Marital status: Widowed    Spouse name: Not on file  . Number of children: Not on file  . Years of education: Not on file  . Highest education level: Not on file  Occupational History  . Occupation: disability  Tobacco Use  . Smoking status: Former Smoker    Packs/day: 25.00    Years: 1.00    Pack years: 25.00    Types: Cigarettes    Quit date: 08/27/1998    Years since quitting: 21.8  . Smokeless tobacco: Never Used  . Tobacco comment: quit in 2000  Vaping Use  . Vaping Use: Never used  Substance and  Sexual Activity  . Alcohol use: No    Alcohol/week: 0.0 standard drinks  . Drug use: No  . Sexual activity: Yes    Birth control/protection: Surgical  Other Topics Concern  . Not on file  Social History Narrative  . Not on file   Social Determinants of Health   Financial Resource Strain: Not on file  Food Insecurity: Not on file  Transportation Needs: Not on file  Physical Activity: Not on file  Stress: Not on file  Social Connections: Not on file   Family History  Problem Relation Age of Onset  . Bone cancer Sister   . Pancreatic cancer Sister   . Pneumonia Mother   . Heart attack Father   . Diabetes Sister   . Dementia Sister   . Colon cancer Neg Hx    Outpatient Encounter Medications as of 06/10/2020  Medication Sig  . Ibuprofen (ADVIL) 200 MG CAPS Take by mouth. As needed  . albuterol (VENTOLIN HFA) 108 (90 Base) MCG/ACT inhaler Inhale 1-2 puffs into the lungs every 6 (six) hours as needed for wheezing or shortness of  breath.  . ALPRAZolam (XANAX) 1 MG tablet Take 0.5 tablets (0.5 mg total) by mouth every 8 (eight) hours as needed for anxiety.  Marland Kitchen amLODipine (NORVASC) 5 MG tablet Take 1 tablet (5 mg total) by mouth daily.  Marland Kitchen azithromycin (ZITHROMAX) 250 MG tablet Take 2 on day one then 1 daily x 4 days (Patient not taking: Reported on 06/10/2020)  . Fluticasone-Umeclidin-Vilant (TRELEGY ELLIPTA) 100-62.5-25 MCG/INH AEPB Inhale 1 puff into the lungs daily.   Marland Kitchen ipratropium-albuterol (DUONEB) 0.5-2.5 (3) MG/3ML SOLN Take 3 mLs by nebulization every 6 (six) hours as needed (shortness of breath or wheezing).  Marland Kitchen levothyroxine (SYNTHROID) 75 MCG tablet Take 1 tablet (75 mcg total) by mouth daily.  Marland Kitchen OVER THE COUNTER MEDICATION Over the counter cough syrup -  Take tablespoon twice daily as needed for cough  . oxyCODONE (ROXICODONE) 15 MG immediate release tablet Take 15 mg by mouth every 8 (eight) hours as needed for pain.   . pantoprazole (PROTONIX) 40 MG tablet TAKE 1 TABLET BY  MOUTH DAILY.  Marland Kitchen Vitamin D, Ergocalciferol, (DRISDOL) 1.25 MG (50000 UNIT) CAPS capsule Take 50,000 Units by mouth every 7 (seven) days.   No facility-administered encounter medications on file as of 06/10/2020.   ALLERGIES: Allergies  Allergen Reactions  . Penicillins Itching       . Vicodin [Hydrocodone-Acetaminophen] Nausea And Vomiting    VACCINATION STATUS: Immunization History  Administered Date(s) Administered  . Influenza Split 03/08/2016  . Influenza-Unspecified 01/28/2020  . Moderna Sars-Covid-2 Vaccination 10/30/2019, 11/27/2019  . Pneumococcal Polysaccharide-23 06/29/2017    HPI Marilyn Solomon is 75 y.o. female who presents today with a medical history as above. she is being seen in consultation for multinodular goiter requested by Marilyn Spar, MD.  History is obtained directly from the patient as well as chart review.  She discovered thyroid enlargement earlier this month on her own.  Subsequent ultrasound on June 07 2018-second showed asymmetric enlargement of her thyroid with 2.3 cm nodule on the right lobe and 1 cm nodule in the left lobe.  She is a patient with advanced COPD on portable oxygen supplement. She denies dysphagia, shortness of breath, nor voice change.  She denies palpitations, tremors, heat/cold intolerance.  She was diagnosed with hypothyroidism few years ago, currently on levothyroxine 75 mcg p.o. daily before breakfast.  Her dose was adjusted after recent labs showed inadequate replacement. -She denies family history of thyroid malignancy.  Review of Systems  Constitutional: + Reports fluctuating body weight , + fatigue, no subjective hyperthermia, no subjective hypothermia Eyes: no blurry vision, no xerophthalmia ENT: no sore throat, no nodules palpated in throat, no dysphagia/odynophagia, no hoarseness Cardiovascular: no Chest Pain, no Shortness of Breath, no palpitations, no leg swelling Respiratory: + Uses portable oxygen canister, no  cough, no shortness of breath Gastrointestinal: no Nausea/Vomiting/Diarhhea Musculoskeletal: no muscle/joint aches Skin: no rashes Neurological: no tremors, no numbness, no tingling, no dizziness Psychiatric: no depression, no anxiety  Objective:    Vitals with BMI 06/10/2020 06/03/2020 06/03/2020  Height 5\' 5"  - 5\' 5"   Weight 108 lbs 10 oz - 108 lbs  BMI 35.36 - 14.43  Systolic 154 008 676  Diastolic 70 81 84  Pulse 96 - 100    BP (!) 164/70   Pulse 96   Ht 5\' 5"  (1.651 m)   Wt 108 lb 9.6 oz (49.3 kg)   BMI 18.07 kg/m   Wt Readings from Last 3 Encounters:  06/10/20 108 lb 9.6 oz (49.3 kg)  06/03/20 108 lb (49 kg)  05/11/20 106 lb 8 oz (48.3 kg)    Physical Exam  Constitutional:  Body mass index is 18.07 kg/m.,  not in acute distress, normal state of mind Eyes: PERRLA, EOMI, no exophthalmos ENT: moist mucous membranes, + gross symmetric thyromegaly R>L, no gross cervical lymphadenopathy Cardiovascular: normal precordial activity, Regular Rate and Rhythm, no Murmur/Rubs/Gallops Respiratory:  adequate breathing efforts, no gross chest deformity, Clear to auscultation bilaterally Gastrointestinal: abdomen soft, Non -tender, No distension, Bowel Sounds present, no gross organomegaly Musculoskeletal: no gross deformities, strength intact in all four extremities Skin: moist, warm, no rashes Neurological: no tremor with outstretched hands, Deep tendon reflexes normal in bilateral lower extremities.  CMP ( most recent) CMP     Component Value Date/Time   NA 142 06/03/2020 1019   K 4.9 06/03/2020 1019   CL 99 06/03/2020 1019   CO2 29 06/03/2020 1019   GLUCOSE 107 (H) 06/03/2020 1019   GLUCOSE 140 (H) 01/27/2020 0711   BUN 22 06/03/2020 1019   CREATININE 0.89 06/03/2020 1019   CALCIUM 9.2 06/03/2020 1019   PROT 7.7 06/03/2020 1019   ALBUMIN 4.2 06/03/2020 1019   AST 16 06/03/2020 1019   ALT 7 06/03/2020 1019   ALKPHOS 90 06/03/2020 1019   BILITOT 0.2 06/03/2020 1019    GFRNONAA 65 06/03/2020 1019   GFRAA 74 06/03/2020 1019     Diabetic Labs (most recent): Lab Results  Component Value Date   HGBA1C 6.0 (H) 06/03/2020   HGBA1C 5.6 11/07/2018   HGBA1C 5.5 06/29/2017     Lipid Panel ( most recent) Lipid Panel     Component Value Date/Time   CHOL 190 06/03/2020 1019   TRIG 157 (H) 06/03/2020 1019   HDL 41 06/03/2020 1019   CHOLHDL 4.6 (H) 06/03/2020 1019   LDLCALC 121 (H) 06/03/2020 1019   LABVLDL 28 06/03/2020 1019        Thyroid ultrasound on June 07, 2020 - Right lobe measured 5.1 cm with 2.3 cm suspicious nodule, left lobe 2.6 cm with 1 cm nodule.     Results for KEVIONNA, HEFFLER (MRN [id number]) as of 06/10/2020 21:30  Ref. Range 06/21/2018 11:00 01/26/2020 06:27 06/03/2020 10:19  TSH Latest Ref Range: 0.450 - 4.500 uIU/mL 1.298 3.260 6.150 (H)  T4,Free(Direct) Latest Ref Range: 0.82 - 1.77 ng/dL   1.40    Assessment & Plan:   1. Multinodular goiter 2. Hypothyroidism, unspecified type - SETSUKO ROBINS  is being seen at a kind request of Marilyn Spar, MD. - I have reviewed her available thyroid records and clinically evaluated the patient. - Based on these reviews, she has multinodular goiter.  She is already scheduled for fine needle biopsy on June 16, 2020.  I advised her to keep that appointment for the procedure.  Her next care will depend on the biopsy results.  She wishes to proceed with work-up including surgical treatment if necessary.   Regarding her hypothyroidism, I noticed that her dose was recently adjusted after she was seen to have TSH of 6.15 on June 03, 2020.  I agree with this adjustment.  She is advised to continue levothyroxine 75 mcg p.o. daily before breakfast.    - We discussed about the correct intake of her thyroid hormone, on empty stomach at fasting, with water, separated by at least 30 minutes from breakfast and other medications,  and separated by more than 4 hours from calcium, iron,  multivitamins, acid reflux medications (PPIs). -Patient  is made aware of the fact that thyroid hormone replacement is needed for life, dose to be adjusted by periodic monitoring of thyroid function tests.  - I did not initiate any new prescriptions today. - she is advised to maintain close follow up with Marilyn Spar, MD for primary care needs.   - Time spent with the patient: 60 minutes, of which >50% was spent in  counseling her about her multinodular goiter, hypothyroidism and the rest in obtaining information about her symptoms, reviewing her previous labs/studies ( including abstractions from other facilities),  evaluations, and treatments,  and developing a plan to confirm diagnosis and long term treatment based on the latest standards of care/guidelines; and documenting her care.  Marilyn Solomon participated in the discussions, expressed understanding, and voiced agreement with the above plans.  All questions were answered to her satisfaction. she is encouraged to contact clinic should she have any questions or concerns prior to her return visit.  Follow up plan: Return in about 2 weeks (around 06/24/2020) for F/U with Biopsy Results.   Glade Lloyd, MD [facility] [address] [place], [place] [address] Phone: [phone]  Fax: [phone]     06/10/2020, 5:31 PM  This note was partially dictated with voice recognition software. Similar sounding words can be transcribed inadequately or may not  be corrected upon review.

## 2020-06-11 IMAGING — DX DG CHEST 1V PORT
1 series · 1 of 1 positions shown · non-contrast
Comparison: 09/27/2019

CLINICAL DATA: COVID, shortness of breath

EXAM:
PORTABLE CHEST 1 VIEW

[chest ap]
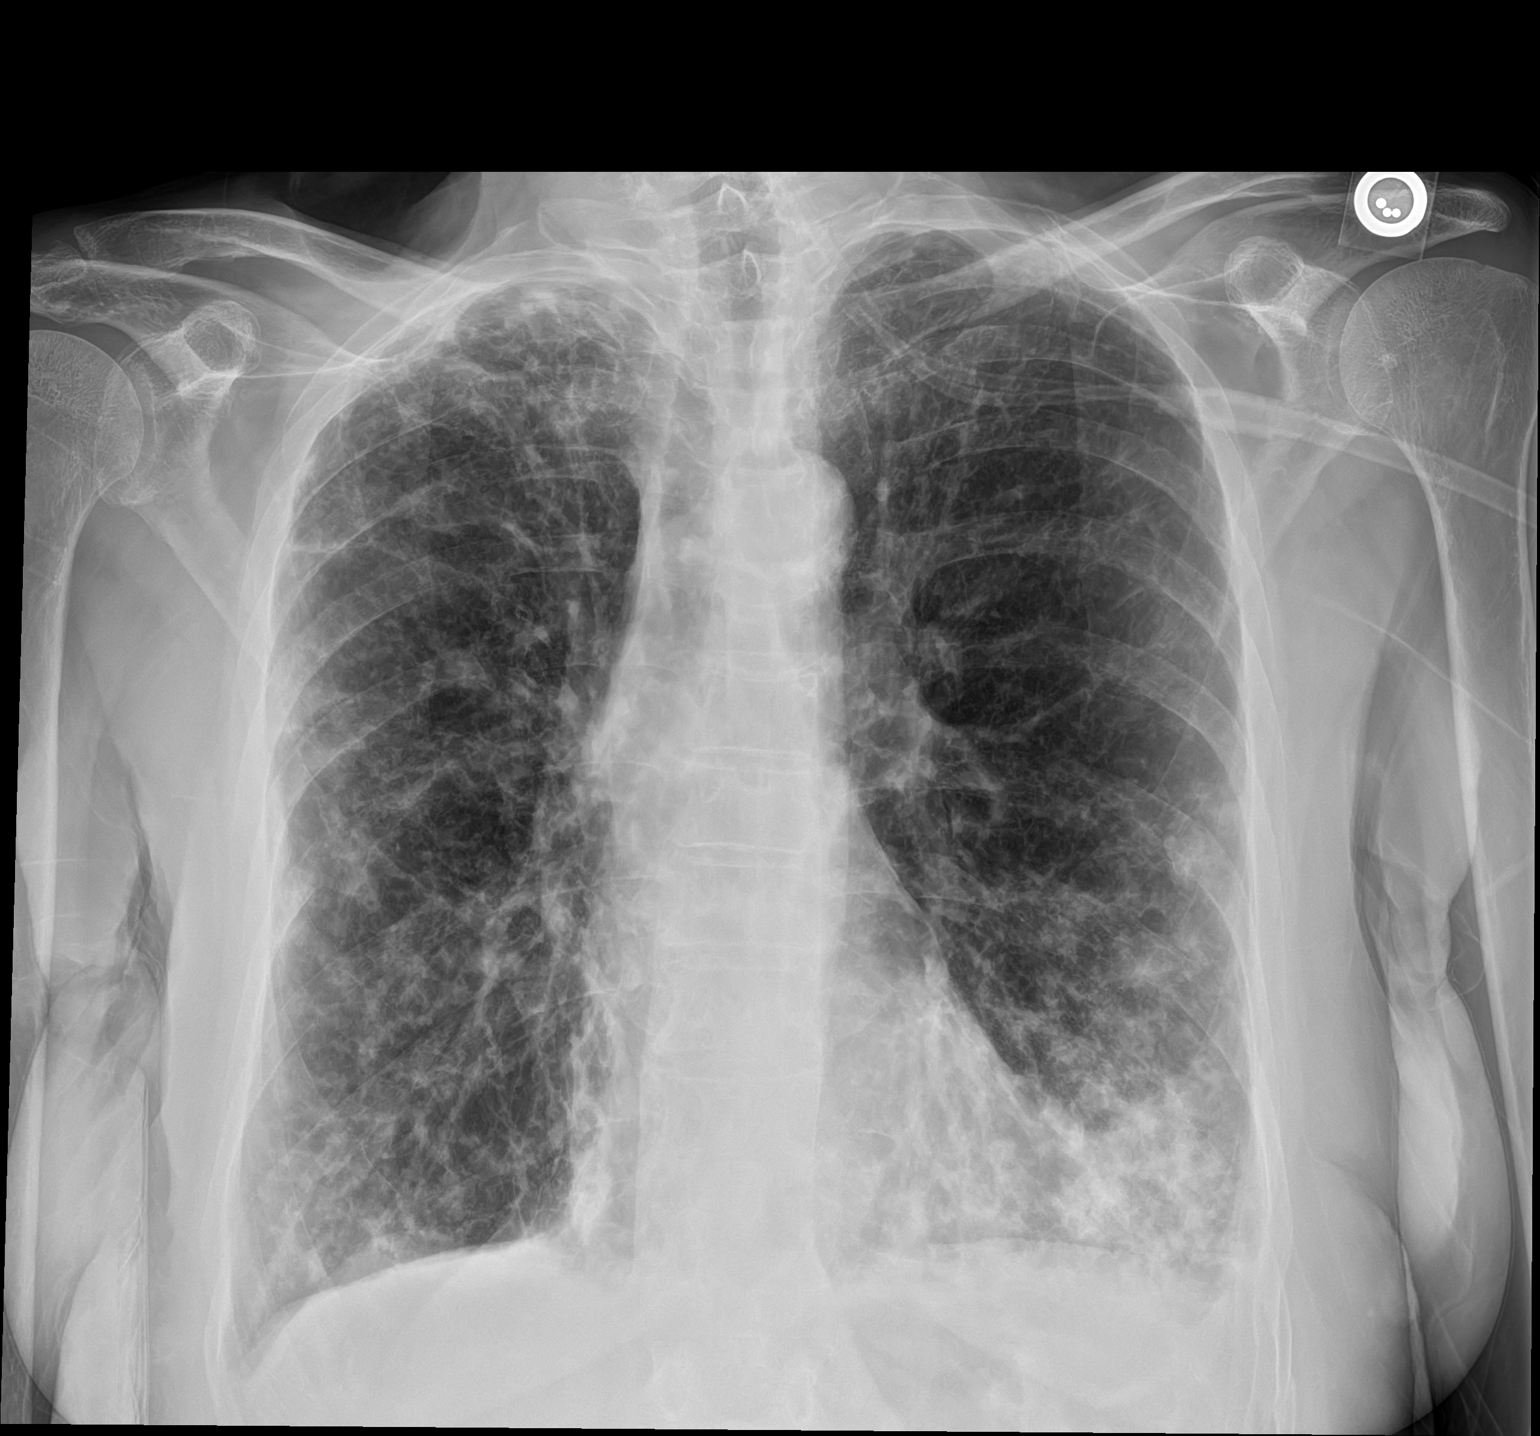

[1 of 1 positions shown; findings below may reference images not displayed]

FINDINGS: Lungs remain hyperinflated. Patchy bilateral airspace opacities and
interstitial prominence throughout the lungs. Overall, patchy
opacities slightly increased since prior study. Heart is normal
size. No effusions or pneumothorax. No acute bony abnormality.
IMPRESSION: COPD.

Patchy bilateral airspace disease slightly worsening since prior
study

## 2020-06-15 ENCOUNTER — Telehealth (INDEPENDENT_AMBULATORY_CARE_PROVIDER_SITE_OTHER): Payer: Medicare Other | Admitting: Family Medicine

## 2020-06-15 ENCOUNTER — Other Ambulatory Visit: Payer: Self-pay

## 2020-06-15 ENCOUNTER — Encounter: Payer: Self-pay | Admitting: Family Medicine

## 2020-06-15 VITALS — Ht 65.0 in | Wt 108.0 lb

## 2020-06-15 DIAGNOSIS — Z0000 Encounter for general adult medical examination without abnormal findings: Secondary | ICD-10-CM | POA: Diagnosis not present

## 2020-06-15 NOTE — Progress Notes (Addendum)
Subjective:   Marilyn Solomon is a 75 y.o. female who presents for Medicare Annual (Subsequent) preventive examination.   Participants: Nurse for intake and work up; Patient and Provider for Visit and Wrap up  Method of visit: Telephone  Location of Patient: Home Location of Provider: Office Consent was obtain for visit over the telephone. Services rendered by provider: Visit was performed via telephone  I verified that I am speaking with the correct person using two identifiers.  Review of Systems Cardiac Risk Factors include: advanced age (>32men, >66 women)     Objective:    Today's Vitals   06/15/20 0918  Weight: 108 lb (49 kg)  Height: 5\' 5"  (1.651 m)  PainSc: 0-No pain   Body mass index is 17.97 kg/m.  Advanced Directives 06/15/2020 01/23/2020 10/08/2019 10/08/2019 10/07/2019 09/28/2019 09/28/2019  Does Patient Have a Medical Advance Directive? No No No No No No Yes  Does patient want to make changes to medical advance directive? No - Patient declined - No - Patient declined No - Patient declined - No - Patient declined No - Patient declined  Would patient like information on creating a medical advance directive? No - Patient declined No - Patient declined No - Patient declined - - No - Patient declined -    Current Medications (verified) Outpatient Encounter Medications as of 06/15/2020  Medication Sig  . albuterol (VENTOLIN HFA) 108 (90 Base) MCG/ACT inhaler Inhale 1-2 puffs into the lungs every 6 (six) hours as needed for wheezing or shortness of breath.  . ALPRAZolam (XANAX) 1 MG tablet Take 0.5 tablets (0.5 mg total) by mouth every 8 (eight) hours as needed for anxiety.  Marland Kitchen amLODipine (NORVASC) 5 MG tablet Take 1 tablet (5 mg total) by mouth daily.  . Fluticasone-Umeclidin-Vilant (TRELEGY ELLIPTA) 100-62.5-25 MCG/INH AEPB Inhale 1 puff into the lungs daily.   . Ibuprofen 200 MG CAPS Take by mouth. As needed  . levothyroxine (SYNTHROID) 75 MCG tablet Take 1 tablet  (75 mcg total) by mouth daily.  Marland Kitchen OVER THE COUNTER MEDICATION Over the counter cough syrup -  Take tablespoon twice daily as needed for cough  . oxyCODONE (ROXICODONE) 15 MG immediate release tablet Take 15 mg by mouth every 8 (eight) hours as needed for pain.   . pantoprazole (PROTONIX) 40 MG tablet TAKE 1 TABLET BY MOUTH DAILY.  Marland Kitchen Vitamin D, Ergocalciferol, (DRISDOL) 1.25 MG (50000 UNIT) CAPS capsule Take 50,000 Units by mouth every 7 (seven) days.  Marland Kitchen ipratropium-albuterol (DUONEB) 0.5-2.5 (3) MG/3ML SOLN Take 3 mLs by nebulization every 6 (six) hours as needed (shortness of breath or wheezing).  . [DISCONTINUED] azithromycin (ZITHROMAX) 250 MG tablet Take 2 on day one then 1 daily x 4 days (Patient not taking: Reported on 06/15/2020)   No facility-administered encounter medications on file as of 06/15/2020.    Allergies (verified) Penicillins and Vicodin [hydrocodone-acetaminophen]   History: Past Medical History:  Diagnosis Date  . Anemia   . Anxiety   . Arthritis   . Chronic back pain   . COPD (chronic obstructive pulmonary disease) ([place])   . GERD (gastroesophageal reflux disease)   . HCAP (healthcare-associated pneumonia) 06/28/2017  . Hypertension   . Hypothyroidism   . Reflux    no medications currently, asymptomatic  . Thyroid disease    Past Surgical History:  Procedure Laterality Date  . ABDOMINAL HYSTERECTOMY    . BIOPSY  10/08/2017   Procedure: BIOPSY;  Surgeon: Daneil Dolin, MD;  Location: [facility];  Service: Endoscopy;;  gastric  . cataracts Bilateral   . COLONOSCOPY  2007   Dr. Gala Romney: normal rectum, diverticula   . COLONOSCOPY WITH PROPOFOL N/A 11/15/2015   Dr. Gala Romney: grade II hemorrhoids, diverticulosis  . COLONOSCOPY WITH PROPOFOL N/A 10/08/2017   Rourk: Diverticulosis  . ESOPHAGOGASTRODUODENOSCOPY (EGD) WITH PROPOFOL N/A 10/08/2017   Rourk: Nonobstructing Schatzki ring, mild erosive reflux esophagitis, small hiatal hernia, gastritis but no H. pylori.  .  FRACTURE SURGERY Right 2005   Wrist  . JOINT REPLACEMENT Left 2000   hip  . TOTAL HIP ARTHROPLASTY Left   . WRIST SURGERY Right    Family History  Problem Relation Age of Onset  . Bone cancer Sister   . Pancreatic cancer Sister   . Pneumonia Mother   . Heart attack Father   . Diabetes Sister   . Dementia Sister   . Colon cancer Neg Hx    Social History   Socioeconomic History  . Marital status: Widowed    Spouse name: Not on file  . Number of children: Not on file  . Years of education: Not on file  . Highest education level: Not on file  Occupational History  . Occupation: disability  Tobacco Use  . Smoking status: Former Smoker    Packs/day: 25.00    Years: 1.00    Pack years: 25.00    Types: Cigarettes    Quit date: 08/27/1998    Years since quitting: 21.8  . Smokeless tobacco: Never Used  . Tobacco comment: quit in 2000  Vaping Use  . Vaping Use: Never used  Substance and Sexual Activity  . Alcohol use: No    Alcohol/week: 0.0 standard drinks  . Drug use: No  . Sexual activity: Yes    Birth control/protection: Surgical  Other Topics Concern  . Not on file  Social History Narrative  . Not on file   Social Determinants of Health   Financial Resource Strain: Low Risk   . Difficulty of Paying Living Expenses: Not hard at all  Food Insecurity: No Food Insecurity  . Worried About Charity fundraiser in the Last Year: Never true  . Ran Out of Food in the Last Year: Never true  Transportation Needs: No Transportation Needs  . Lack of Transportation (Medical): No  . Lack of Transportation (Non-Medical): No  Physical Activity: Insufficiently Active  . Days of Exercise per Week: 7 days  . Minutes of Exercise per Session: 10 min  Stress: No Stress Concern Present  . Feeling of Stress : Not at all  Social Connections: Socially Isolated  . Frequency of Communication with Friends and Family: More than three times a week  . Frequency of Social Gatherings with  Friends and Family: Once a week  . Attends Religious Services: Never  . Active Member of Clubs or Organizations: No  . Attends Archivist Meetings: Never  . Marital Status: Widowed    Tobacco Counseling Counseling given: Yes Comment: quit in 2000   Clinical Intake:  Pre-visit preparation completed: Yes  Pain : No/denies pain Pain Score: 0-No pain     BMI - recorded: 18 Nutritional Status: BMI <19  Underweight Nutritional Risks: None Diabetes: No  How often do you need to have someone help you when you read instructions, pamphlets, or other written materials from your doctor or pharmacy?: 1 - Never What is the last grade level you completed in school?: 12  Diabetic? no  Interpreter Needed?: No  Information entered by :: Laretta Bolster, LPN   Activities of Daily Living In your present state of health, do you have any difficulty performing the following activities: 06/15/2020 01/23/2020  Hearing? Tempie Donning  Vision? N N  Difficulty concentrating or making decisions? N N  Walking or climbing stairs? Y Y  Dressing or bathing? N N  Doing errands, shopping? N N  Preparing Food and eating ? N -  Using the Toilet? N -  In the past six months, have you accidently leaked urine? N -  Do you have problems with loss of bowel control? N -  Managing your Medications? N -  Managing your Finances? N -  Housekeeping or managing your Housekeeping? N -  Some recent data might be hidden    Patient Care Team: Lindell Spar, MD as PCP - General (Internal Medicine) Gala Romney Cristopher Estimable, MD as Consulting Physician (Gastroenterology) Sinda Du, MD as Consulting Physician (Pulmonary Disease)  Indicate any recent Medical Services you may have received from other than [facility] providers in the past year (date may be approximate).     Assessment:   This is a routine wellness examination for [place].  Hearing/Vision screen No exam data present  Dietary issues and exercise  activities discussed: Current Exercise Habits: Home exercise routine, Type of exercise: walking, Time (Minutes): 10, Frequency (Times/Week): 7, Weekly Exercise (Minutes/Week): 70, Intensity: Mild  Goals    . DIET - INCREASE WATER INTAKE      Depression Screen PHQ 2/9 Scores 06/15/2020 06/03/2020 01/09/2019 07/01/2018  PHQ - 2 Score 0 0 0 0    Fall Risk Fall Risk  06/15/2020 06/03/2020 01/09/2019 07/01/2018  Falls in the past year? 0 0 0 0  Number falls in past yr: 0 0 - (No Data)  Comment - - - Reported no falls.  Injury with Fall? 0 0 - (No Data)  Comment - - - Reported no falls.  Risk for fall due to : No Fall Risks No Fall Risks Medication side effect -  Follow up Falls evaluation completed Falls evaluation completed Falls prevention discussed -    FALL RISK PREVENTION PERTAINING TO THE HOME:  Any stairs in or around the home? Yes  If so, are there any without handrails? No  Home free of loose throw rugs in walkways, pet beds, electrical cords, etc? yes Adequate lighting in your home to reduce risk of falls? Yes   ASSISTIVE DEVICES UTILIZED TO PREVENT FALLS:  Life alert? No  Use of a cane, walker or w/c? Yes  Grab bars in the bathroom? Yes  Shower chair or bench in shower? No  Elevated toilet seat or a handicapped toilet? yes  TIMED UP AND GO:  Was the test performed? No .  Length of time to ambulate n/a    Cognitive Function:     6CIT Screen 06/15/2020  What Year? 0 points  What month? 0 points  What time? 0 points  Count back from 20 0 points  Months in reverse 0 points  Repeat phrase 0 points  Total Score 0   Immunizations Immunization History  Administered Date(s) Administered  . Influenza Split 03/08/2016  . Influenza-Unspecified 01/28/2020  . Moderna Sars-Covid-2 Vaccination 10/30/2019, 11/27/2019  . Pneumococcal Polysaccharide-23 06/29/2017    TDAP status: Up to date  Flu Vaccine status: Up to date  Pneumococcal vaccine status: Up to date  Covid-19  vaccine status: Completed vaccines  Qualifies for Shingles Vaccine? Yes   Zostavax completed No   Shingrix  Completed?: No.    Education has been provided regarding the importance of this vaccine. Patient has been advised to call insurance company to determine out of pocket expense if they have not yet received this vaccine. Advised may also receive vaccine at local pharmacy or Health Dept. Verbalized acceptance and understanding.  Screening Tests Health Maintenance  Topic Date Due  . Hepatitis C Screening  Never done  . TETANUS/TDAP  Never done  . PNA vac Low Risk Adult (2 of 2 - PCV13) 06/29/2018  . COVID-19 Vaccine (3 - Booster for Moderna series) 05/29/2020  . URINE MICROALBUMIN  06/03/2021  . MAMMOGRAM  04/07/2022  . COLONOSCOPY (Pts 45-58yrs Insurance coverage will need to be confirmed)  10/09/2027  . INFLUENZA VACCINE  Completed  . DEXA SCAN  Completed    Health Maintenance  Health Maintenance Due  Topic Date Due  . Hepatitis C Screening  Never done  . TETANUS/TDAP  Never done  . PNA vac Low Risk Adult (2 of 2 - PCV13) 06/29/2018  . COVID-19 Vaccine (3 - Booster for Moderna series) 05/29/2020    Colorectal cancer screening: Type of screening: Colonoscopy. Completed 10/08/17. Repeat every 10 years  Mammogram status: Completed 04/07/20. Repeat every year  Bone Density status: Completed 05/29/18. Results reflect: Bone density results: NORMAL. Repeat every 5 years.  Lung Cancer Screening: (Low Dose CT Chest recommended if Age 49-80 years, 30 pack-year currently smoking OR have quit w/in 15years.) does not qualify.   Lung Cancer Screening Referral: n/a  Additional Screening:  Hepatitis C Screening: does not qualify  Vision Screening: Recommended annual ophthalmology exams for early detection of glaucoma and other disorders of the eye. Is the patient up to date with their annual eye exam?  Yes  Who is the provider or what is the name of the office in which the patient  attends annual eye exams? [facility] If pt is not established with a provider, would they like to be referred to a provider to establish care? No .   Dental Screening: Recommended annual dental exams for proper oral hygiene  Community Resource Referral / Chronic Care Management: CRR required this visit?  No   CCM required this visit?  No      Plan:     1. Encounter for Medicare annual wellness exam   I have personally reviewed and noted the following in the patient's chart:   . Medical and social history . Use of alcohol, tobacco or illicit drugs  . Current medications and supplements . Functional ability and status . Nutritional status . Physical activity . Advanced directives . List of other physicians . Hospitalizations, surgeries, and ER visits in previous 12 months . Vitals . Screenings to include cognitive, depression, and falls . Referrals and appointments  In addition, I have reviewed and discussed with patient certain preventive protocols, quality metrics, and best practice recommendations. A written personalized care plan for preventive services as well as general preventive health recommendations were provided to patient.     Agreed with the above documentation   Perlie Mayo, NP   06/15/2020   Nurse Notes: AWV conducted by nurse off site by phone. Patient gave consent to telehealth visit via audio. Patient at home at time of visit. Provider off site. Visit took 30 minutes to complete.

## 2020-06-15 NOTE — Patient Instructions (Addendum)
Marilyn Solomon , Thank you for taking time to come for your Medicare Wellness Visit. I appreciate your ongoing commitment to your health goals. Please review the following plan we discussed and let me know if I can assist you in the future.   Screening recommendations/referrals: Colonoscopy: 10/09/27 Mammogram: 04/07/22 Bone Density: Complete Recommended yearly ophthalmology/optometry visit for glaucoma screening and checkup Recommended yearly dental visit for hygiene and checkup  Vaccinations: Influenza vaccine: Fall 2022 Pneumococcal vaccine: Complete Tdap vaccine: Due Shingles vaccine: Declined   Advanced directives: No  Conditions/risks identified: None  Next appointment: 09/02/72  @ 9 am   Preventive Care 74 Years and Older, Female Preventive care refers to lifestyle choices and visits with your health care provider that can promote health and wellness. What does preventive care include?  A yearly physical exam. This is also called an annual well check.  Dental exams once or twice a year.  Routine eye exams. Ask your health care provider how often you should have your eyes checked.  Personal lifestyle choices, including:  Daily care of your teeth and gums.  Regular physical activity.  Eating a healthy diet.  Avoiding tobacco and drug use.  Limiting alcohol use.  Practicing safe sex.  Taking low-dose aspirin every day.  Taking vitamin and mineral supplements as recommended by your health care provider. What happens during an annual well check? The services and screenings done by your health care provider during your annual well check will depend on your age, overall health, lifestyle risk factors, and family history of disease. Counseling  Your health care provider may ask you questions about your:  Alcohol use.  Tobacco use.  Drug use.  Emotional well-being.  Home and relationship well-being.  Sexual activity.  Eating habits.  History of  falls.  Memory and ability to understand (cognition).  Work and work Statistician.  Reproductive health. Screening  You may have the following tests or measurements:  Height, weight, and BMI.  Blood pressure.  Lipid and cholesterol levels. These may be checked every 5 years, or more frequently if you are over 102 years old.  Skin check.  Lung cancer screening. You may have this screening every year starting at age 37 if you have a 30-pack-year history of smoking and currently smoke or have quit within the past 15 years.  Fecal occult blood test (FOBT) of the stool. You may have this test every year starting at age 7.  Flexible sigmoidoscopy or colonoscopy. You may have a sigmoidoscopy every 5 years or a colonoscopy every 10 years starting at age 63.  Hepatitis C blood test.  Hepatitis B blood test.  Sexually transmitted disease (STD) testing.  Diabetes screening. This is done by checking your blood sugar (glucose) after you have not eaten for a while (fasting). You may have this done every 1-3 years.  Bone density scan. This is done to screen for osteoporosis. You may have this done starting at age 76.  Mammogram. This may be done every 1-2 years. Talk to your health care provider about how often you should have regular mammograms. Talk with your health care provider about your test results, treatment options, and if necessary, the need for more tests. Vaccines  Your health care provider may recommend certain vaccines, such as:  Influenza vaccine. This is recommended every year.  Tetanus, diphtheria, and acellular pertussis (Tdap, Td) vaccine. You may need a Td booster every 10 years.  Zoster vaccine. You may need this after age 22.  Pneumococcal 13-valent  conjugate (PCV13) vaccine. One dose is recommended after age 64.  Pneumococcal polysaccharide (PPSV23) vaccine. One dose is recommended after age 50. Talk to your health care provider about which screenings and  vaccines you need and how often you need them. This information is not intended to replace advice given to you by your health care provider. Make sure you discuss any questions you have with your health care provider. Document Released: 06/11/2015 Document Revised: 02/02/2016 Document Reviewed: 03/16/2015 Elsevier Interactive Patient Education  2017 [place] Prevention in the Home Falls can cause injuries. They can happen to people of all ages. There are many things you can do to make your home safe and to help prevent falls. What can I do on the outside of my home?  Regularly fix the edges of walkways and driveways and fix any cracks.  Remove anything that might make you trip as you walk through a door, such as a raised step or threshold.  Trim any bushes or trees on the path to your home.  Use bright outdoor lighting.  Clear any walking paths of anything that might make someone trip, such as rocks or tools.  Regularly check to see if handrails are loose or broken. Make sure that both sides of any steps have handrails.  Any raised decks and porches should have guardrails on the edges.  Have any leaves, snow, or ice cleared regularly.  Use sand or salt on walking paths during winter.  Clean up any spills in your garage right away. This includes oil or grease spills. What can I do in the bathroom?  Use night lights.  Install grab bars by the toilet and in the tub and shower. Do not use towel bars as grab bars.  Use non-skid mats or decals in the tub or shower.  If you need to sit down in the shower, use a plastic, non-slip stool.  Keep the floor dry. Clean up any water that spills on the floor as soon as it happens.  Remove soap buildup in the tub or shower regularly.  Attach bath mats securely with double-sided non-slip rug tape.  Do not have throw rugs and other things on the floor that can make you trip. What can I do in the bedroom?  Use night  lights.  Make sure that you have a light by your bed that is easy to reach.  Do not use any sheets or blankets that are too big for your bed. They should not hang down onto the floor.  Have a firm chair that has side arms. You can use this for support while you get dressed.  Do not have throw rugs and other things on the floor that can make you trip. What can I do in the kitchen?  Clean up any spills right away.  Avoid walking on wet floors.  Keep items that you use a lot in easy-to-reach places.  If you need to reach something above you, use a strong step stool that has a grab bar.  Keep electrical cords out of the way.  Do not use floor polish or wax that makes floors slippery. If you must use wax, use non-skid floor wax.  Do not have throw rugs and other things on the floor that can make you trip. What can I do with my stairs?  Do not leave any items on the stairs.  Make sure that there are handrails on both sides of the stairs and use them. Fix handrails  that are broken or loose. Make sure that handrails are as long as the stairways.  Check any carpeting to make sure that it is firmly attached to the stairs. Fix any carpet that is loose or worn.  Avoid having throw rugs at the top or bottom of the stairs. If you do have throw rugs, attach them to the floor with carpet tape.  Make sure that you have a light switch at the top of the stairs and the bottom of the stairs. If you do not have them, ask someone to add them for you. What else can I do to help prevent falls?  Wear shoes that:  Do not have high heels.  Have rubber bottoms.  Are comfortable and fit you well.  Are closed at the toe. Do not wear sandals.  If you use a stepladder:  Make sure that it is fully opened. Do not climb a closed stepladder.  Make sure that both sides of the stepladder are locked into place.  Ask someone to hold it for you, if possible.  Clearly mark and make sure that you can  see:  Any grab bars or handrails.  First and last steps.  Where the edge of each step is.  Use tools that help you move around (mobility aids) if they are needed. These include:  Canes.  Walkers.  Scooters.  Crutches.  Turn on the lights when you go into a dark area. Replace any light bulbs as soon as they burn out.  Set up your furniture so you have a clear path. Avoid moving your furniture around.  If any of your floors are uneven, fix them.  If there are any pets around you, be aware of where they are.  Review your medicines with your doctor. Some medicines can make you feel dizzy. This can increase your chance of falling. Ask your doctor what other things that you can do to help prevent falls. This information is not intended to replace advice given to you by your health care provider. Make sure you discuss any questions you have with your health care provider. Document Released: 03/11/2009 Document Revised: 10/21/2015 Document Reviewed: 06/19/2014 Elsevier Interactive Patient Education  2017 [employer].

## 2020-06-16 ENCOUNTER — Other Ambulatory Visit: Payer: Self-pay

## 2020-06-16 ENCOUNTER — Encounter (HOSPITAL_COMMUNITY): Payer: Self-pay

## 2020-06-16 ENCOUNTER — Ambulatory Visit (HOSPITAL_COMMUNITY)
Admission: RE | Admit: 2020-06-16 | Discharge: 2020-06-16 | Disposition: A | Discharge status: Home / Self Care | Payer: Medicare Other | Source: Ambulatory Visit | Attending: Internal Medicine | Admitting: Internal Medicine

## 2020-06-16 DIAGNOSIS — E079 Disorder of thyroid, unspecified: Secondary | ICD-10-CM

## 2020-06-16 DIAGNOSIS — R896 Abnormal cytological findings in specimens from other organs, systems and tissues: Secondary | ICD-10-CM | POA: Diagnosis not present

## 2020-06-16 DIAGNOSIS — E041 Nontoxic single thyroid nodule: Secondary | ICD-10-CM | POA: Diagnosis not present

## 2020-06-16 MED ORDER — LIDOCAINE HCL (PF) 2 % IJ SOLN
INTRAMUSCULAR | Status: AC
  Filled 2020-06-16: qty 10

## 2020-06-16 NOTE — Procedures (Signed)
PreOperative Dx: RT thyroid nodule Postoperative Dx: RT thyroid nodule Procedure:   US guided FNA of RT thyroid nodule Radiologist:  Thornton Papas Anesthesia:  2 ml of 2% lidocaine Specimen:  FNA x 5  EBL:   < 1 ml Complications: None

## 2020-06-17 ENCOUNTER — Telehealth: Payer: Self-pay

## 2020-06-17 ENCOUNTER — Other Ambulatory Visit: Payer: Self-pay | Admitting: Internal Medicine

## 2020-06-17 DIAGNOSIS — G8929 Other chronic pain: Secondary | ICD-10-CM

## 2020-06-17 DIAGNOSIS — F419 Anxiety disorder, unspecified: Secondary | ICD-10-CM

## 2020-06-17 MED ORDER — ALPRAZOLAM 1 MG PO TABS
1.0000 mg | ORAL_TABLET | Freq: Three times a day (TID) | ORAL | 2 refills | Status: DC | PRN
Start: 2020-06-17 — End: 2020-10-06

## 2020-06-17 MED ORDER — OXYCODONE HCL 15 MG PO TABS: 15.0000 mg | ORAL_TABLET | Freq: Three times a day (TID) | ORAL | 0 refills | Status: DC | PRN

## 2020-06-17 NOTE — Telephone Encounter (Signed)
Sent prescriptions. Thank you.

## 2020-06-17 NOTE — Telephone Encounter (Signed)
Pt called stating she needs her Oxycodone and Xanax refilled to [facility]. She was prescribed this by her previous physcian.

## 2020-06-17 NOTE — Telephone Encounter (Signed)
Pt informed

## 2020-06-22 LAB — CYTOLOGY - NON PAP

## 2020-06-23 ENCOUNTER — Telehealth: Payer: Self-pay | Admitting: "Endocrinology

## 2020-06-23 NOTE — Telephone Encounter (Signed)
Pt states her throat has started burning bad and would like to know if its anything she can take for this

## 2020-06-24 ENCOUNTER — Ambulatory Visit: Payer: Medicare Other | Admitting: "Endocrinology

## 2020-06-24 DIAGNOSIS — E079 Disorder of thyroid, unspecified: Secondary | ICD-10-CM | POA: Diagnosis not present

## 2020-06-24 NOTE — Telephone Encounter (Signed)
If she means the needle site, it will heal within days. No medication will be needed. She has to report if it is getting swollen or if changes color.

## 2020-06-24 NOTE — Telephone Encounter (Signed)
Left a message requesting a return call to the office. 

## 2020-06-25 NOTE — Telephone Encounter (Signed)
Left a message requesting a return call to the office. 

## 2020-06-27 DIAGNOSIS — J441 Chronic obstructive pulmonary disease with (acute) exacerbation: Secondary | ICD-10-CM | POA: Diagnosis not present

## 2020-06-30 ENCOUNTER — Encounter: Payer: Self-pay | Admitting: *Deleted

## 2020-06-30 ENCOUNTER — Other Ambulatory Visit: Payer: Self-pay | Admitting: *Deleted

## 2020-06-30 DIAGNOSIS — D649 Anemia, unspecified: Secondary | ICD-10-CM

## 2020-07-05 ENCOUNTER — Ambulatory Visit: Payer: Medicare Other | Admitting: "Endocrinology

## 2020-07-06 ENCOUNTER — Encounter (HOSPITAL_COMMUNITY): Payer: Self-pay

## 2020-07-08 ENCOUNTER — Ambulatory Visit: Payer: Medicare Other | Admitting: "Endocrinology

## 2020-07-08 ENCOUNTER — Encounter: Payer: Self-pay | Admitting: "Endocrinology

## 2020-07-08 ENCOUNTER — Other Ambulatory Visit: Payer: Self-pay

## 2020-07-08 VITALS — BP 116/68 | HR 92 | Ht 65.0 in | Wt 107.6 lb

## 2020-07-08 DIAGNOSIS — C73 Malignant neoplasm of thyroid gland: Secondary | ICD-10-CM | POA: Diagnosis not present

## 2020-07-08 DIAGNOSIS — E042 Nontoxic multinodular goiter: Secondary | ICD-10-CM | POA: Diagnosis not present

## 2020-07-08 DIAGNOSIS — E039 Hypothyroidism, unspecified: Secondary | ICD-10-CM | POA: Diagnosis not present

## 2020-07-08 NOTE — Progress Notes (Signed)
07/08/2020, 3:03 PM  Endocrinology follow-up note   Subjective:    Patient ID: Marilyn Solomon, female    DOB: [birth year], PCP Marilyn Spar, MD   Past Medical History:  Diagnosis Date  . Anemia   . Anxiety   . Arthritis   . Chronic back pain   . COPD (chronic obstructive pulmonary disease) ([place])   . GERD (gastroesophageal reflux disease)   . HCAP (healthcare-associated pneumonia) 06/28/2017  . Hypertension   . Hypothyroidism   . Reflux    no medications currently, asymptomatic  . Thyroid disease    Past Surgical History:  Procedure Laterality Date  . ABDOMINAL HYSTERECTOMY    . BIOPSY  10/08/2017   Procedure: BIOPSY;  Surgeon: Daneil Dolin, MD;  [facility];  Service: Endoscopy;;  gastric  . cataracts Bilateral   . COLONOSCOPY  2007   Dr. Gala Romney: normal rectum, diverticula   . COLONOSCOPY WITH PROPOFOL N/A 11/15/2015   Dr. Gala Romney: grade II hemorrhoids, diverticulosis  . COLONOSCOPY WITH PROPOFOL N/A 10/08/2017   Rourk: Diverticulosis  . ESOPHAGOGASTRODUODENOSCOPY (EGD) WITH PROPOFOL N/A 10/08/2017   Rourk: Nonobstructing Schatzki ring, mild erosive reflux esophagitis, small hiatal hernia, gastritis but no H. pylori.  . FRACTURE SURGERY Right 2005   Wrist  . JOINT REPLACEMENT Left 2000   hip  . TOTAL HIP ARTHROPLASTY Left   . WRIST SURGERY Right    Social History   Socioeconomic History  . Marital status: Widowed    Spouse name: Not on file  . Number of children: Not on file  . Years of education: Not on file  . Highest education level: Not on file  Occupational History  . Occupation: disability  Tobacco Use  . Smoking status: Former Smoker    Packs/day: 25.00    Years: 1.00    Pack years: 25.00    Types: Cigarettes    Quit date: 08/27/1998    Years since quitting: 21.8  . Smokeless tobacco: Never Used  . Tobacco comment: quit in 2000  Vaping Use  . Vaping Use: Never used  Substance  and Sexual Activity  . Alcohol use: No    Alcohol/week: 0.0 standard drinks  . Drug use: No  . Sexual activity: Yes    Birth control/protection: Surgical  Other Topics Concern  . Not on file  Social History Narrative  . Not on file   Social Determinants of Health   Financial Resource Strain: Low Risk   . Difficulty of Paying Living Expenses: Not hard at all  Food Insecurity: No Food Insecurity  . Worried About Charity fundraiser in the Last Year: Never true  . Ran Out of Food in the Last Year: Never true  Transportation Needs: No Transportation Needs  . Lack of Transportation (Medical): No  . Lack of Transportation (Non-Medical): No  Physical Activity: Insufficiently Active  . Days of Exercise per Week: 7 days  . Minutes of Exercise per Session: 10 min  Stress: No Stress Concern Present  . Feeling of Stress : Not at all  Social Connections: Socially Isolated  . Frequency of Communication with Friends and Family: More than three times a week  . Frequency of Social Gatherings  with Friends and Family: Once a week  . Attends Religious Services: Never  . Active Member of Clubs or Organizations: No  . Attends Archivist Meetings: Never  . Marital Status: Widowed   Family History  Problem Relation Age of Onset  . Bone cancer Sister   . Pancreatic cancer Sister   . Pneumonia Mother   . Heart attack Father   . Diabetes Sister   . Dementia Sister   . Colon cancer Neg Hx    Outpatient Encounter Medications as of 07/08/2020  Medication Sig  . albuterol (VENTOLIN HFA) 108 (90 Base) MCG/ACT inhaler Inhale 1-2 puffs into the lungs every 6 (six) hours as needed for wheezing or shortness of breath.  . ALPRAZolam (XANAX) 1 MG tablet Take 1 tablet (1 mg total) by mouth every 8 (eight) hours as needed for anxiety.  Marland Kitchen amLODipine (NORVASC) 5 MG tablet Take 1 tablet (5 mg total) by mouth daily.  . Fluticasone-Umeclidin-Vilant (TRELEGY ELLIPTA) 100-62.5-25 MCG/INH AEPB Inhale 1  puff into the lungs daily.   . Ibuprofen 200 MG CAPS Take by mouth. As needed  . ipratropium-albuterol (DUONEB) 0.5-2.5 (3) MG/3ML SOLN Take 3 mLs by nebulization every 6 (six) hours as needed (shortness of breath or wheezing).  Marland Kitchen levothyroxine (SYNTHROID) 75 MCG tablet Take 1 tablet (75 mcg total) by mouth daily.  Marland Kitchen OVER THE COUNTER MEDICATION Over the counter cough syrup -  Take tablespoon twice daily as needed for cough  . oxyCODONE (ROXICODONE) 15 MG immediate release tablet Take 1 tablet (15 mg total) by mouth every 8 (eight) hours as needed for pain.  . pantoprazole (PROTONIX) 40 MG tablet TAKE 1 TABLET BY MOUTH DAILY.  Marland Kitchen Vitamin D, Ergocalciferol, (DRISDOL) 1.25 MG (50000 UNIT) CAPS capsule Take 50,000 Units by mouth every 7 (seven) days.   No facility-administered encounter medications on file as of 07/08/2020.   ALLERGIES: Allergies  Allergen Reactions  . Penicillins Itching       . Vicodin [Hydrocodone-Acetaminophen] Nausea And Vomiting    VACCINATION STATUS: Immunization History  Administered Date(s) Administered  . Influenza Split 03/08/2016  . Influenza-Unspecified 01/28/2020  . Moderna Sars-Covid-2 Vaccination 10/30/2019, 11/27/2019  . Pneumococcal Polysaccharide-23 06/29/2017    HPI Marilyn Solomon is 75 y.o. female who presents today for follow-up after she was seen in consultation for multinodular goiter.  She is status post fine-needle aspiration of a thyroid nodule.    PMD: Marilyn Spar, MD.   See notes from previous visit.   History is obtained directly from the patient as well as chart review.  She discovered thyroid enlargement in January 2022, subsequent thyroid ultrasound  on  June 07 2020 showed asymmetric enlargement of her thyroid with 2.3 cm nodule on the right lobe and 1 cm nodule in the left lobe.   She underwent fine-needle aspiration biopsy of this nodule on June 16, 2020.  A sample was sent for Afirma testing which reveals approximately  50% risk of malignancy. She is a patient with advanced COPD on portable oxygen supplement. She denies dysphagia, shortness of breath, nor voice change.  She denies palpitations, tremors, heat/cold intolerance.  She was diagnosed with hypothyroidism few years ago, currently on levothyroxine 75 mcg p.o. daily before breakfast.  She is a generally light build percent.  No major recent weight loss. -She denies family history of thyroid malignancy.  Review of Systems  Constitutional: + Reports fluctuating body weight , + fatigue, no subjective hyperthermia, no subjective hypothermia  Respiratory: +  Uses portable oxygen canister, no cough, no shortness of breath   Objective:    Vitals with BMI 07/08/2020 06/16/2020 06/15/2020  Height 5\' 5"  - 5\' 5"   Weight 107 lbs 10 oz - 108 lbs  BMI 44.01 - 02.72  Systolic 536 644 -  Diastolic 68 76 -  Pulse 92 92 -    BP 116/68   Pulse 92   Ht 5\' 5"  (1.651 m)   Wt 107 lb 9.6 oz (48.8 kg)   BMI 17.91 kg/m   Wt Readings from Last 3 Encounters:  07/08/20 107 lb 9.6 oz (48.8 kg)  06/15/20 108 lb (49 kg)  06/10/20 108 lb 9.6 oz (49.3 kg)    Physical Exam  Constitutional:  Body mass index is 17.91 kg/m.,  not in acute distress, normal state of mind Eyes: PERRLA, EOMI, no exophthalmos ENT: moist mucous membranes, + gross symmetric thyromegaly R>L, no gross cervical lymphadenopathy   CMP ( most recent) CMP     Component Value Date/Time   NA 142 06/03/2020 1019   K 4.9 06/03/2020 1019   CL 99 06/03/2020 1019   CO2 29 06/03/2020 1019   GLUCOSE 107 (H) 06/03/2020 1019   GLUCOSE 140 (H) 01/27/2020 0711   BUN 22 06/03/2020 1019   CREATININE 0.89 06/03/2020 1019   CALCIUM 9.2 06/03/2020 1019   PROT 7.7 06/03/2020 1019   ALBUMIN 4.2 06/03/2020 1019   AST 16 06/03/2020 1019   ALT 7 06/03/2020 1019   ALKPHOS 90 06/03/2020 1019   BILITOT 0.2 06/03/2020 1019   GFRNONAA 65 06/03/2020 1019   GFRAA 74 06/03/2020 1019     Diabetic Labs (most  recent): Lab Results  Component Value Date   HGBA1C 6.0 (H) 06/03/2020   HGBA1C 5.6 11/07/2018   HGBA1C 5.5 06/29/2017     Lipid Panel ( most recent) Lipid Panel     Component Value Date/Time   CHOL 190 06/03/2020 1019   TRIG 157 (H) 06/03/2020 1019   HDL 41 06/03/2020 1019   CHOLHDL 4.6 (H) 06/03/2020 1019   LDLCALC 121 (H) 06/03/2020 1019   LABVLDL 28 06/03/2020 1019        Thyroid ultrasound on June 07, 2020 - Right lobe measured 5.1 cm with 2.3 cm suspicious nodule, left lobe 2.6 cm with 1 cm nodule.  Fine-needle aspiration biopsy on June 16, 2020 Afirma test =50% risk of malignancy   Assessment & Plan:   1.  Malignant neoplasm of the thyroid 2. Hypothyroidism  I reviewed her recent labs and biopsy results.  She has thyroid malignancy, and patient wishes to proceed with surgical treatment. I discussed and arranged referral to Dr. Armandina Gemma of [facility] surgery in [facility].  Regarding her hypothyroidism: She is on recently adjusted levothyroxine 75 mcg p.o. daily before breakfast.   - We discussed about the correct intake of her thyroid hormone, on empty stomach at fasting, with water, separated by at least 30 minutes from breakfast and other medications,  and separated by more than 4 hours from calcium, iron, multivitamins, acid reflux medications (PPIs). -Patient is made aware of the fact that thyroid hormone replacement is needed for life, dose to be adjusted by periodic monitoring of thyroid function tests.  - I did not initiate any new prescriptions today. - she is advised to maintain close follow up with Marilyn Spar, MD for primary care needs.     - Time spent on this patient care encounter:  30 minutes of which  50% was spent in  counseling and the rest reviewing  her current and  previous labs / studies and medications  doses and developing a plan for long term care, and documenting this care. Remonia Richter   participated in the discussions, expressed understanding, and voiced agreement with the above plans.  All questions were answered to her satisfaction. she is encouraged to contact clinic should she have any questions or concerns prior to her return visit.   Follow up plan: Return in about 5 weeks (around 08/12/2020) for F/U with Labs after Surgery.   Glade Lloyd, MD [facility] [address] [place], [place] [address] Phone: [phone]  Fax: [phone]     07/08/2020, 3:03 PM  This note was partially dictated with voice recognition software. Similar sounding words can be transcribed inadequately or may not  be corrected upon review.

## 2020-07-26 DIAGNOSIS — J441 Chronic obstructive pulmonary disease with (acute) exacerbation: Secondary | ICD-10-CM | POA: Diagnosis not present

## 2020-07-28 ENCOUNTER — Other Ambulatory Visit: Payer: Self-pay | Admitting: Internal Medicine

## 2020-07-28 DIAGNOSIS — J449 Chronic obstructive pulmonary disease, unspecified: Secondary | ICD-10-CM

## 2020-08-04 DIAGNOSIS — D440 Neoplasm of uncertain behavior of thyroid gland: Secondary | ICD-10-CM | POA: Diagnosis not present

## 2020-08-04 DIAGNOSIS — R590 Localized enlarged lymph nodes: Secondary | ICD-10-CM | POA: Diagnosis not present

## 2020-08-04 DIAGNOSIS — R490 Dysphonia: Secondary | ICD-10-CM | POA: Diagnosis not present

## 2020-08-05 ENCOUNTER — Other Ambulatory Visit: Payer: Self-pay | Admitting: Surgery

## 2020-08-05 ENCOUNTER — Ambulatory Visit
Admission: RE | Admit: 2020-08-05 | Discharge: 2020-08-05 | Disposition: A | Discharge status: Home / Self Care | Payer: Medicare Other | Source: Ambulatory Visit | Attending: Surgery | Admitting: Surgery

## 2020-08-05 ENCOUNTER — Other Ambulatory Visit: Payer: Self-pay

## 2020-08-05 DIAGNOSIS — R490 Dysphonia: Secondary | ICD-10-CM

## 2020-08-05 DIAGNOSIS — D440 Neoplasm of uncertain behavior of thyroid gland: Secondary | ICD-10-CM

## 2020-08-05 DIAGNOSIS — R590 Localized enlarged lymph nodes: Secondary | ICD-10-CM

## 2020-08-05 DIAGNOSIS — R591 Generalized enlarged lymph nodes: Secondary | ICD-10-CM | POA: Diagnosis not present

## 2020-08-05 MED ORDER — IOPAMIDOL (ISOVUE-300) INJECTION 61%
75.0000 mL | Freq: Once | INTRAVENOUS | Status: AC | PRN
  Administered 2020-08-05: 75 mL via INTRAVENOUS

## 2020-08-12 ENCOUNTER — Telehealth: Payer: Self-pay

## 2020-08-12 NOTE — Telephone Encounter (Signed)
It looks like she had CT Scan done on 3/10 that was ordered by Gerkin, I don't think that she has had the surgery yet.

## 2020-08-12 NOTE — Telephone Encounter (Signed)
Do you know if this patient has her surgery sch? She is on the sch for tomorrow with a follow up with labs after surgery, I have been unable to reach pt

## 2020-08-12 NOTE — Telephone Encounter (Signed)
Okay, thanks

## 2020-08-13 ENCOUNTER — Other Ambulatory Visit: Payer: Self-pay | Admitting: Internal Medicine

## 2020-08-13 ENCOUNTER — Ambulatory Visit: Payer: Medicare Other | Admitting: "Endocrinology

## 2020-08-13 DIAGNOSIS — G8929 Other chronic pain: Secondary | ICD-10-CM

## 2020-08-16 ENCOUNTER — Other Ambulatory Visit: Payer: Self-pay | Admitting: Internal Medicine

## 2020-08-25 DIAGNOSIS — J441 Chronic obstructive pulmonary disease with (acute) exacerbation: Secondary | ICD-10-CM | POA: Diagnosis not present

## 2020-08-27 DIAGNOSIS — Z9981 Dependence on supplemental oxygen: Secondary | ICD-10-CM | POA: Diagnosis not present

## 2020-08-27 DIAGNOSIS — J3489 Other specified disorders of nose and nasal sinuses: Secondary | ICD-10-CM | POA: Diagnosis not present

## 2020-08-27 DIAGNOSIS — J3801 Paralysis of vocal cords and larynx, unilateral: Secondary | ICD-10-CM | POA: Insufficient documentation

## 2020-08-27 DIAGNOSIS — C73 Malignant neoplasm of thyroid gland: Secondary | ICD-10-CM | POA: Diagnosis not present

## 2020-08-27 DIAGNOSIS — R490 Dysphonia: Secondary | ICD-10-CM | POA: Insufficient documentation

## 2020-09-01 ENCOUNTER — Other Ambulatory Visit: Payer: Self-pay | Admitting: Otolaryngology

## 2020-09-02 ENCOUNTER — Ambulatory Visit (INDEPENDENT_AMBULATORY_CARE_PROVIDER_SITE_OTHER): Payer: Medicare Other | Admitting: Internal Medicine

## 2020-09-02 ENCOUNTER — Other Ambulatory Visit: Payer: Self-pay

## 2020-09-02 ENCOUNTER — Encounter: Payer: Self-pay | Admitting: Internal Medicine

## 2020-09-02 VITALS — BP 109/66 | HR 108 | Temp 98.9°F | Resp 18 | Ht 65.0 in | Wt 103.4 lb

## 2020-09-02 DIAGNOSIS — I10 Essential (primary) hypertension: Secondary | ICD-10-CM

## 2020-09-02 DIAGNOSIS — E042 Nontoxic multinodular goiter: Secondary | ICD-10-CM

## 2020-09-02 DIAGNOSIS — F419 Anxiety disorder, unspecified: Secondary | ICD-10-CM

## 2020-09-02 DIAGNOSIS — J449 Chronic obstructive pulmonary disease, unspecified: Secondary | ICD-10-CM | POA: Diagnosis not present

## 2020-09-02 MED ORDER — ALBUTEROL SULFATE HFA 108 (90 BASE) MCG/ACT IN AERS: 1.0000 | INHALATION_SPRAY | Freq: Four times a day (QID) | RESPIRATORY_TRACT | 5 refills | Status: DC | PRN

## 2020-09-02 NOTE — Assessment & Plan Note (Signed)
S/p biopsy - suspicious for neoplasm Reviewed Endocrinology and ENT chart - planned for thyroidectomy on 04/18 Continue Levothyroxine for now

## 2020-09-02 NOTE — Progress Notes (Signed)
Established Patient Office Visit  Subjective:  Patient ID: Marilyn Solomon, female    DOB: 12/20/1945  Age: 75 y.o. MRN: [id number]  CC:  Chief Complaint  Patient presents with  . Follow-up    3 month follow up still having an issue breathing     HPI Marilyn Solomon is a 75 year old female with PMH of COPD, chronic hypoxic respiratory failure on home O2, HTN, anxiety, hypothyroidism, chronic back and hip pain and anemia who presents for follow up of her chronic medical conditions.  COPD: Has been using Trelegy regularly. Has been having mild dyspnea this morning, better with Albuterol. She is on 2 l O2.  HTN: BP now well-controlled with Amlodipine. She denies any headache, dizziness, chest pain, dyspnea or palpitations.  Multinodular goiter: She had ENT evaluation as biopsy was suspicious for neoplasm. Planned to have thyroidectomy.  Past Medical History:  Diagnosis Date  . Anemia   . Anxiety   . Arthritis   . C. difficile diarrhea 10/08/2019  . Chronic back pain   . COPD (chronic obstructive pulmonary disease) ([place])   . GERD (gastroesophageal reflux disease)   . HCAP (healthcare-associated pneumonia) 06/28/2017  . Hypertension   . Hypothyroidism   . Pneumonia due to COVID-19 virus 09/28/2019  . Reflux    no medications currently, asymptomatic  . Thyroid disease     Past Surgical History:  Procedure Laterality Date  . ABDOMINAL HYSTERECTOMY    . BIOPSY  10/08/2017   Procedure: BIOPSY;  Surgeon: Daneil Dolin, MD;  [facility];  Service: Endoscopy;;  gastric  . cataracts Bilateral   . COLONOSCOPY  2007   Dr. Gala Romney: normal rectum, diverticula   . COLONOSCOPY WITH PROPOFOL N/A 11/15/2015   Dr. Gala Romney: grade II hemorrhoids, diverticulosis  . COLONOSCOPY WITH PROPOFOL N/A 10/08/2017   Rourk: Diverticulosis  . ESOPHAGOGASTRODUODENOSCOPY (EGD) WITH PROPOFOL N/A 10/08/2017   Rourk: Nonobstructing Schatzki ring, mild erosive reflux esophagitis, small hiatal  hernia, gastritis but no H. pylori.  . FRACTURE SURGERY Right 2005   Wrist  . JOINT REPLACEMENT Left 2000   hip  . TOTAL HIP ARTHROPLASTY Left   . WRIST SURGERY Right     Family History  Problem Relation Age of Onset  . Bone cancer Sister   . Pancreatic cancer Sister   . Pneumonia Mother   . Heart attack Father   . Diabetes Sister   . Dementia Sister   . Colon cancer Neg Hx     Social History   Socioeconomic History  . Marital status: Widowed    Spouse name: Not on file  . Number of children: Not on file  . Years of education: Not on file  . Highest education level: Not on file  Occupational History  . Occupation: disability  Tobacco Use  . Smoking status: Former Smoker    Packs/day: 25.00    Years: 1.00    Pack years: 25.00    Types: Cigarettes    Quit date: 08/27/1998    Years since quitting: 22.0  . Smokeless tobacco: Never Used  . Tobacco comment: quit in 2000  Vaping Use  . Vaping Use: Never used  Substance and Sexual Activity  . Alcohol use: No    Alcohol/week: 0.0 standard drinks  . Drug use: No  . Sexual activity: Yes    Birth control/protection: Surgical  Other Topics Concern  . Not on file  Social History Narrative  . Not on file   Social Determinants  of Health   Financial Resource Strain: Low Risk   . Difficulty of Paying Living Expenses: Not hard at all  Food Insecurity: No Food Insecurity  . Worried About Charity fundraiser in the Last Year: Never true  . Ran Out of Food in the Last Year: Never true  Transportation Needs: No Transportation Needs  . Lack of Transportation (Medical): No  . Lack of Transportation (Non-Medical): No  Physical Activity: Insufficiently Active  . Days of Exercise per Week: 7 days  . Minutes of Exercise per Session: 10 min  Stress: No Stress Concern Present  . Feeling of Stress : Not at all  Social Connections: Socially Isolated  . Frequency of Communication with Friends and Family: More than three times a week   . Frequency of Social Gatherings with Friends and Family: Once a week  . Attends Religious Services: Never  . Active Member of Clubs or Organizations: No  . Attends Archivist Meetings: Never  . Marital Status: Widowed  Intimate Partner Violence: Not At Risk  . Fear of Current or Ex-Partner: No  . Emotionally Abused: No  . Physically Abused: No  . Sexually Abused: No    Outpatient Medications Prior to Visit  Medication Sig Dispense Refill  . ALPRAZolam (XANAX) 1 MG tablet Take 1 tablet (1 mg total) by mouth every 8 (eight) hours as needed for anxiety. 90 tablet 2  . amLODipine (NORVASC) 5 MG tablet Take 1 tablet (5 mg total) by mouth daily. (Patient taking differently: Take 5 mg by mouth in the morning.) 90 tablet 1  . Fluticasone-Umeclidin-Vilant (TRELEGY ELLIPTA) 100-62.5-25 MCG/INH AEPB Inhale 1 puff into the lungs daily as needed (respiratory issues).    Marland Kitchen ibuprofen (ADVIL) 200 MG tablet Take 400 mg by mouth every 8 (eight) hours as needed (pain).    Marland Kitchen levothyroxine (SYNTHROID) 75 MCG tablet Take 1 tablet (75 mcg total) by mouth daily. 90 tablet 0  . oxyCODONE (ROXICODONE) 15 MG immediate release tablet TAKE (1) TABLET BY MOUTH EVERY 8 HOURS AS NEEDED. (Patient taking differently: Take 15 mg by mouth in the morning, at noon, and at bedtime.) 90 tablet 0  . pantoprazole (PROTONIX) 40 MG tablet TAKE 1 TABLET BY MOUTH DAILY. (Patient taking differently: Take 40 mg by mouth daily.) 90 tablet 3  . Vitamin D, Ergocalciferol, (DRISDOL) 1.25 MG (50000 UNIT) CAPS capsule Take 50,000 Units by mouth every Sunday.    Marland Kitchen albuterol (VENTOLIN HFA) 108 (90 Base) MCG/ACT inhaler INHALE 1 TO 2 PUFFS INTO THE LUNGS EVERY 6 HOURS AS NEEDED FOR WHEEZING OR SHORTNESS OF BREATH (Patient taking differently: Inhale 1-2 puffs into the lungs every 6 (six) hours as needed for wheezing or shortness of breath.) 8.5 g 0  . ipratropium-albuterol (DUONEB) 0.5-2.5 (3) MG/3ML SOLN Take 3 mLs by nebulization every  6 (six) hours as needed (shortness of breath or wheezing). 360 mL 3   No facility-administered medications prior to visit.    Allergies  Allergen Reactions  . Penicillins Itching       . Vicodin [Hydrocodone-Acetaminophen] Nausea And Vomiting    ROS Review of Systems  Constitutional: Negative for chills and fever.  HENT: Positive for hearing loss and voice change. Negative for congestion, ear pain, sinus pressure, sinus pain and sore throat.   Eyes: Negative for pain and discharge.  Respiratory: Positive for cough. Negative for shortness of breath.   Cardiovascular: Negative for chest pain and palpitations.  Gastrointestinal: Negative for abdominal pain, constipation, diarrhea, nausea  and vomiting.  Endocrine: Negative for polydipsia and polyuria.  Genitourinary: Negative for dysuria and hematuria.  Musculoskeletal: Negative for neck pain and neck stiffness.  Skin: Negative for rash.  Neurological: Negative for dizziness and weakness.  Psychiatric/Behavioral: Negative for agitation, behavioral problems, dysphoric mood and suicidal ideas. The patient is nervous/anxious. The patient is not hyperactive.       Objective:    Physical Exam Vitals reviewed.  Constitutional:      General: She is not in acute distress.    Appearance: She is not diaphoretic.  HENT:     Head: Normocephalic and atraumatic.     Nose: Nose normal.     Mouth/Throat:     Mouth: Mucous membranes are moist.  Eyes:     General: No scleral icterus.    Extraocular Movements: Extraocular movements intact.     Pupils: Pupils are equal, round, and reactive to light.  Neck:     Comments: Right sided neck mass - thyroid nodule, nontender Cardiovascular:     Rate and Rhythm: Normal rate and regular rhythm.     Pulses: Normal pulses.     Heart sounds: Normal heart sounds. No murmur heard.   Pulmonary:     Breath sounds: Normal breath sounds. No wheezing or rales.     Comments: On 2 lpm  O2 Musculoskeletal:     Cervical back: No tenderness.     Right lower leg: No edema.     Left lower leg: No edema.  Skin:    General: Skin is warm.     Findings: No rash.  Neurological:     General: No focal deficit present.     Mental Status: She is alert and oriented to person, place, and time.     Sensory: No sensory deficit.     Motor: No weakness.  Psychiatric:        Mood and Affect: Mood normal.        Behavior: Behavior normal.     BP 109/66 (BP Location: Right Arm, Patient Position: Sitting, Cuff Size: Normal)   Pulse (!) 108   Temp 98.9 F (37.2 C) (Oral)   Resp 18   Ht 5\' 5"  (1.651 m)   Wt 103 lb 6.4 oz (46.9 kg)   SpO2 97%   BMI 17.21 kg/m  Wt Readings from Last 3 Encounters:  09/02/20 103 lb 6.4 oz (46.9 kg)  07/08/20 107 lb 9.6 oz (48.8 kg)  06/15/20 108 lb (49 kg)     Health Maintenance Due  Topic Date Due  . PNA vac Low Risk Adult (2 of 2 - PCV13) 06/29/2018    There are no preventive care reminders to display for this patient.  Lab Results  Component Value Date   TSH 6.150 (H) 06/03/2020   Lab Results  Component Value Date   WBC 8.0 06/03/2020   HGB 10.6 (L) 06/03/2020   HCT 34.9 06/03/2020   MCV 81 06/03/2020   PLT 250 06/03/2020   Lab Results  Component Value Date   NA 142 06/03/2020   K 4.9 06/03/2020   CO2 29 06/03/2020   GLUCOSE 107 (H) 06/03/2020   BUN 22 06/03/2020   CREATININE 0.89 06/03/2020   BILITOT 0.2 06/03/2020   ALKPHOS 90 06/03/2020   AST 16 06/03/2020   ALT 7 06/03/2020   PROT 7.7 06/03/2020   ALBUMIN 4.2 06/03/2020   CALCIUM 9.2 06/03/2020   ANIONGAP 9 01/27/2020   Lab Results  Component Value Date   CHOL  190 06/03/2020   Lab Results  Component Value Date   HDL 41 06/03/2020   Lab Results  Component Value Date   LDLCALC 121 (H) 06/03/2020   Lab Results  Component Value Date   TRIG 157 (H) 06/03/2020   Lab Results  Component Value Date   CHOLHDL 4.6 (H) 06/03/2020   Lab Results  Component  Value Date   HGBA1C 6.0 (H) 06/03/2020      Assessment & Plan:   Problem List Items Addressed This Visit      Cardiovascular and Mediastinum   Hypertension - Primary    BP Readings from Last 1 Encounters:  09/02/20 109/66   Well-controlled with Amlodipine Counseled for compliance with the medications Advised DASH diet and moderate exercise/walking, at least 150 mins/week         Respiratory   COPD GOLD  ?     On Trelegy Albuterol PRN Uses home O2, 2 lpm Follows up with Dr [name]      Relevant Medications   albuterol (VENTOLIN HFA) 108 (90 Base) MCG/ACT inhaler     Endocrine   Multinodular goiter    S/p biopsy - suspicious for neoplasm Reviewed Endocrinology and ENT chart - planned for thyroidectomy on 04/18 Continue Levothyroxine for now        Other   Anxiety (Chronic)    Well-controlled Takes Alprazolam 0.5 mg TID PRN         Meds ordered this encounter  Medications  . albuterol (VENTOLIN HFA) 108 (90 Base) MCG/ACT inhaler    Sig: Inhale 1-2 puffs into the lungs every 6 (six) hours as needed for wheezing or shortness of breath.    Dispense:  8 g    Refill:  5    Follow-up: Return in about 4 months (around 01/02/2021).    Lindell Spar, MD

## 2020-09-02 NOTE — Assessment & Plan Note (Signed)
Well-controlled Takes Alprazolam 0.5 mg TID PRN

## 2020-09-02 NOTE — Patient Instructions (Signed)
Continue taking medications as prescribed.  Please follow up with ENT specialist for thyroid surgery.  Please contact us after your surgery for follow up.

## 2020-09-02 NOTE — Assessment & Plan Note (Signed)
BP Readings from Last 1 Encounters:  09/02/20 109/66   Well-controlled with Amlodipine Counseled for compliance with the medications Advised DASH diet and moderate exercise/walking, at least 150 mins/week

## 2020-09-02 NOTE — Assessment & Plan Note (Signed)
On Trelegy Albuterol PRN Uses home O2, 2 lpm Follows up with Dr [name]

## 2020-09-08 NOTE — Pre-Procedure Instructions (Signed)
Surgical Instructions:    Your procedure is scheduled on Monday, 09/13/20 (09:30 AM- 1:30 PM).  Report to [facility] Main Entrance "A" at 07:30 A.M., then check in with the Admitting office.  Call this number if you have any questions prior to, or have any problems the morning of surgery:  [phone]    Remember:  Do not eat or drink after midnight the night before your surgery.     Take these medicines the morning of surgery with A SIP OF WATER: amLODipine (NORVASC) levothyroxine (SYNTHROID) pantoprazole (PROTONIX)  IF NEEDED: ALPRAZolam (XANAX) oxyCODONE (ROXICODONE)  albuterol (VENTOLIN HFA) inhaler- Please bring all inhalers with you the day of surgery Fluticasone-Umeclidin-Vilant (TRELEGY ELLIPTA) inhaler ipratropium-albuterol (DUONEB) nebulizer breathing treatment     As of today, STOP taking any Aspirin (unless otherwise instructed by your surgeon) Aleve, Naproxen, Ibuprofen, Motrin, Advil, Goody's, BC's, all herbal medications, fish oil, and all vitamins.              Special instructions:   [facility]- Preparing For Surgery  Before surgery, you can play an important role. Because skin is not sterile, your skin needs to be as free of germs as possible. You can reduce the number of germs on your skin by washing with CHG (chlorahexidine gluconate) Soap before surgery.  CHG is an antiseptic cleaner which kills germs and bonds with the skin to continue killing germs even after washing.    Oral Hygiene is also important to reduce your risk of infection.  Remember - BRUSH YOUR TEETH THE MORNING OF SURGERY WITH YOUR REGULAR TOOTHPASTE  Please do not use if you have an allergy to CHG or antibacterial soaps. If your skin becomes reddened/irritated stop using the CHG.  Do not shave (including legs and underarms) for at least 48 hours prior to first CHG shower. It is OK to shave your face.  Please follow these instructions carefully.   1. Shower the NIGHT BEFORE SURGERY  and the MORNING OF SURGERY  2. If you chose to wash your hair, wash your hair first as usual with your normal shampoo.  3. After you shampoo, rinse your hair and body thoroughly to remove the shampoo.  4. Wash Face and genitals (private parts) with your normal soap.   5. Use CHG Soap as you would any other liquid soap. You can apply CHG directly to the skin and wash gently with a scrungie or a clean washcloth.   6. Apply the CHG Soap to your body ONLY FROM THE NECK DOWN.  Do not use on open wounds or open sores. Avoid contact with your eyes, ears, mouth and genitals (private parts). Wash Face and genitals (private parts)  with your normal soap.   7. Wash thoroughly, paying special attention to the area where your surgery will be performed.  8. Thoroughly rinse your body with warm water from the neck down.  9. DO NOT shower/wash with your normal soap after using and rinsing off the CHG Soap.  10. Pat yourself dry with a CLEAN TOWEL.  11. Wear CLEAN PAJAMAS to bed the night before surgery.  12. Place CLEAN SHEETS on your bed the night before your surgery.  13. DO NOT SLEEP WITH PETS.   Day of Surgery: SHOWER with CHG soap. Brush your teeth WITH YOUR REGULAR TOOTHPASTE. Wear Clean/Comfortable clothing the morning of surgery. Do not apply any deodorants/lotions.   Do not wear jewelry, make up, or nail polish. Do not shave 48 hours prior to surgery.  Do NOT Smoke (Tobacco/Vaping) or drink Alcohol 24 hours prior to your procedure. Do not bring valuables to the hospital. [facility] is not responsible for any belongings or valuables.  If you use a CPAP at night, you may bring all equipment for your overnight stay.   Contacts, glasses, or dentures may not be worn into surgery, please bring cases for these belongings.   For patients admitted to the hospital, discharge time will be determined by your treatment team.   Patients discharged the day of surgery will not be allowed to  drive home, and someone needs to stay with them for 24 hours.    Please read over the following fact sheets that you were given.

## 2020-09-09 ENCOUNTER — Encounter (HOSPITAL_COMMUNITY)
Admission: RE | Admit: 2020-09-09 | Discharge: 2020-09-09 | Disposition: A | Discharge status: Home / Self Care | Payer: Medicare Other | Source: Ambulatory Visit | Attending: Otolaryngology | Admitting: Otolaryngology

## 2020-09-09 ENCOUNTER — Other Ambulatory Visit: Payer: Self-pay

## 2020-09-09 ENCOUNTER — Encounter (HOSPITAL_COMMUNITY): Payer: Self-pay

## 2020-09-09 DIAGNOSIS — J9611 Chronic respiratory failure with hypoxia: Secondary | ICD-10-CM | POA: Diagnosis not present

## 2020-09-09 DIAGNOSIS — Z9981 Dependence on supplemental oxygen: Secondary | ICD-10-CM | POA: Diagnosis not present

## 2020-09-09 DIAGNOSIS — Z01812 Encounter for preprocedural laboratory examination: Secondary | ICD-10-CM | POA: Insufficient documentation

## 2020-09-09 DIAGNOSIS — J9612 Chronic respiratory failure with hypercapnia: Secondary | ICD-10-CM | POA: Diagnosis not present

## 2020-09-09 DIAGNOSIS — J449 Chronic obstructive pulmonary disease, unspecified: Secondary | ICD-10-CM | POA: Diagnosis not present

## 2020-09-09 DIAGNOSIS — Z20822 Contact with and (suspected) exposure to covid-19: Secondary | ICD-10-CM | POA: Insufficient documentation

## 2020-09-09 DIAGNOSIS — D649 Anemia, unspecified: Secondary | ICD-10-CM | POA: Diagnosis not present

## 2020-09-09 DIAGNOSIS — R590 Localized enlarged lymph nodes: Secondary | ICD-10-CM | POA: Diagnosis not present

## 2020-09-09 DIAGNOSIS — J3801 Paralysis of vocal cords and larynx, unilateral: Secondary | ICD-10-CM | POA: Diagnosis not present

## 2020-09-09 HISTORY — DX: Dependence on supplemental oxygen: Z99.81

## 2020-09-09 LAB — CBC
HCT: 36.1 % (ref 36.0–46.0)
Hemoglobin: 10.6 g/dL — ABNORMAL LOW (ref 12.0–15.0)
MCH: 24.9 pg — ABNORMAL LOW (ref 26.0–34.0)
MCHC: 29.4 g/dL — ABNORMAL LOW (ref 30.0–36.0)
MCV: 84.7 fL (ref 80.0–100.0)
Platelets: 333 10*3/uL (ref 150–400)
RBC: 4.26 MIL/uL (ref 3.87–5.11)
RDW: 13.3 % (ref 11.5–15.5)
WBC: 8.1 10*3/uL (ref 4.0–10.5)
nRBC: 0 % (ref 0.0–0.2)

## 2020-09-09 LAB — BASIC METABOLIC PANEL
Anion gap: 8 (ref 5–15)
BUN: 14 mg/dL (ref 8–23)
CO2: 32 mmol/L (ref 22–32)
Calcium: 9.1 mg/dL (ref 8.9–10.3)
Chloride: 99 mmol/L (ref 98–111)
Creatinine, Ser: 1 mg/dL (ref 0.44–1.00)
GFR, Estimated: 59 mL/min — ABNORMAL LOW (ref >60)
Glucose, Bld: 131 mg/dL — ABNORMAL HIGH (ref 70–99)
Potassium: 4 mmol/L (ref 3.5–5.1)
Sodium: 139 mmol/L (ref 135–145)

## 2020-09-09 LAB — SARS CORONAVIRUS 2 (TAT 6-24 HRS): SARS Coronavirus 2: NEGATIVE

## 2020-09-09 NOTE — Progress Notes (Signed)
PCP - Dr. Ihor Dow (new provider) Cardiologist - denies Pulmonologist- Dr. Christinia Gully   Chest x-ray - 05/11/20 EKG - 01/23/20 Stress Test - denies ECHO - 01/23/20 Cardiac Cath - denies  Sleep Study - denies CPAP - denies  Blood Thinner Instructions: n/a Aspirin Instructions:n/a   COVID TEST- 09/09/20; pending. Pt aware to quarantine from now until DOS.   Anesthesia review: Yes, uses home O2 (2L). Follows up regularly with pulmonologist. Karoline Caldwell, PA-C with anesthesia made aware. No new orders at this time.  Patient denies shortness of breath, fever, cough and chest pain at PAT appointment   All instructions explained to the patient, with a verbal understanding of the material. Patient agrees to go over the instructions while at home for a better understanding. Patient also instructed to self quarantine after being tested for COVID-19. The opportunity to ask questions was provided.

## 2020-09-10 NOTE — Progress Notes (Signed)
Anesthesia Chart Review:  Follows with pulmonologist Dr. 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  Recently evaluated by ENT for 63-month history of enlarging mass in the right anterior neck, hoarseness, and otalgia. 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 The patient also has scattered adenopathy in the right neck which may be consistent with metastatic disease. Flexible laryngoscopy performed in the ENT office 08/27/2020 showed paralyzed right vocal cord as a source for hoarseness, concerning for malignancy involving the right recurrent laryngeal nerve.   Patient reported her breathing was at baseline at preadmission testing appointment.  Preop labs reviewed, mild anemia with hemoglobin 10.6, otherwise unremarkable.  EKG 01/22/2020: Sinus rhythm.  Rate 93. Borderline short PR interval  Flexible laryngoscopy procedure note 08/27/2020 (Care Everywhere): After adequate topical anesthetic was applied, 4 mm flexible laryngoscope was passed through the nasal cavity without difficulty. Flexible laryngoscopy shows patent anterior nasal cavity with minimal crusting, no discharge or infection.  Normal base of tongue and supraglottis The patient has a paralyzed right vocal cord in the paramedian position with relatively good apposition from the left.. Hypopharynx normal without mass, pooling of secretions or aspiration.  CT soft tissue neck 08/05/2020: IMPRESSION: Large mass in the right thyroid bed with ill-defined margins. Findings consistent with thyroid malignancy.  Right cord paresis.  Multiple small lymph nodes in the neck  with increased enhancement, suspicious for tumor spread. Right paraesophageal and paratracheal lymph nodes also suspicious.  Further recommendation with PET  CT recommended for staging.  Right apical pleural and parenchymal changes most likely due to scarring and possibly infection. Attention at PET CT recommended.  TTE 01/23/2019: 1. The left ventricle has normal systolic function with an ejection  fraction of 60-65%. The cavity size was normal. Left ventricular diastolic  Doppler parameters are consistent with impaired relaxation.  2. The right ventricle has normal systolic function. The cavity was  normal. There is no increase in right ventricular wall thickness.    Wynonia [facility]/Anesthesiology Phone [phone] 09/10/2020 9:20 AM

## 2020-09-10 NOTE — Anesthesia Preprocedure Evaluation (Addendum)
Anesthesia Evaluation  Patient identified by MRN, date of birth, ID band Patient awake    Reviewed: Allergy & Precautions, NPO status , Patient's Chart, lab work & pertinent test results  History of Anesthesia Complications Negative for: history of anesthetic complications  Airway Mallampati: II  TM Distance: >3 FB Neck ROM: Full    Dental  (+) Edentulous Upper, Edentulous Lower   Pulmonary COPD,  COPD inhaler and oxygen dependent, former smoker,    + rhonchi  (-) decreased breath sounds      Cardiovascular hypertension, Pt. on medications Normal cardiovascular exam   '20 TTE - EF 55-60%, trivial MR    Neuro/Psych PSYCHIATRIC DISORDERS Anxiety negative neurological ROS     GI/Hepatic Neg liver ROS, GERD  Medicated and Controlled, IBS    Endo/Other  Hypothyroidism  Thyroid cancer   Renal/GU negative Renal ROS     Musculoskeletal  (+) Arthritis ,   Abdominal   Peds  Hematology negative hematology ROS (+)   Anesthesia Other Findings Covid test negative Covid+ 09/2019 with PNA Complete paralysis of right vocal cord See PAT note   Reproductive/Obstetrics                           Anesthesia Physical Anesthesia Plan  ASA: III  Anesthesia Plan: General   Post-op Pain Management:    Induction: Intravenous  PONV Risk Score and Plan: 3 and Treatment may vary due to age or medical condition, Ondansetron and Dexamethasone  Airway Management Planned: Oral ETT and Video Laryngoscope Planned  Additional Equipment: None  Intra-op Plan:   Post-operative Plan: Extubation in OR  Informed Consent: I have reviewed the patients History and Physical, chart, labs and discussed the procedure including the risks, benefits and alternatives for the proposed anesthesia with the patient or authorized representative who has indicated his/her understanding and acceptance.     Dental advisory  given  Plan Discussed with: CRNA and Anesthesiologist  Anesthesia Plan Comments:       Anesthesia Quick Evaluation

## 2020-09-13 ENCOUNTER — Encounter (HOSPITAL_COMMUNITY): Payer: Self-pay | Admitting: Otolaryngology

## 2020-09-13 ENCOUNTER — Inpatient Hospital Stay (HOSPITAL_COMMUNITY): Payer: Medicare Other

## 2020-09-13 ENCOUNTER — Other Ambulatory Visit: Payer: Self-pay

## 2020-09-13 ENCOUNTER — Inpatient Hospital Stay (HOSPITAL_COMMUNITY): Payer: Medicare Other | Admitting: Physician Assistant

## 2020-09-13 ENCOUNTER — Inpatient Hospital Stay (HOSPITAL_COMMUNITY)
Admission: RE | Admit: 2020-09-13 | Discharge: 2020-09-17 | DRG: 626 | Disposition: A | Discharge status: Home / Self Care | Payer: Medicare Other | Attending: Otolaryngology | Admitting: Otolaryngology

## 2020-09-13 ENCOUNTER — Encounter (HOSPITAL_COMMUNITY)
Admission: RE | Disposition: A | Discharge status: Home / Self Care | Payer: Self-pay | Source: Home / Self Care | Attending: Otolaryngology

## 2020-09-13 ENCOUNTER — Inpatient Hospital Stay (HOSPITAL_COMMUNITY): Payer: Medicare Other | Admitting: Anesthesiology

## 2020-09-13 DIAGNOSIS — Z800 Family history of malignant neoplasm of digestive organs: Secondary | ICD-10-CM

## 2020-09-13 DIAGNOSIS — Z8616 Personal history of COVID-19: Secondary | ICD-10-CM

## 2020-09-13 DIAGNOSIS — Z9911 Dependence on respirator [ventilator] status: Secondary | ICD-10-CM

## 2020-09-13 DIAGNOSIS — I10 Essential (primary) hypertension: Secondary | ICD-10-CM | POA: Diagnosis present

## 2020-09-13 DIAGNOSIS — Z833 Family history of diabetes mellitus: Secondary | ICD-10-CM | POA: Diagnosis not present

## 2020-09-13 DIAGNOSIS — C73 Malignant neoplasm of thyroid gland: Principal | ICD-10-CM

## 2020-09-13 DIAGNOSIS — Z880 Allergy status to penicillin: Secondary | ICD-10-CM

## 2020-09-13 DIAGNOSIS — Z885 Allergy status to narcotic agent status: Secondary | ICD-10-CM

## 2020-09-13 DIAGNOSIS — J9601 Acute respiratory failure with hypoxia: Secondary | ICD-10-CM

## 2020-09-13 DIAGNOSIS — E039 Hypothyroidism, unspecified: Secondary | ICD-10-CM | POA: Diagnosis present

## 2020-09-13 DIAGNOSIS — Z8249 Family history of ischemic heart disease and other diseases of the circulatory system: Secondary | ICD-10-CM

## 2020-09-13 DIAGNOSIS — I700 Atherosclerosis of aorta: Secondary | ICD-10-CM | POA: Diagnosis not present

## 2020-09-13 DIAGNOSIS — Z7951 Long term (current) use of inhaled steroids: Secondary | ICD-10-CM | POA: Diagnosis not present

## 2020-09-13 DIAGNOSIS — K219 Gastro-esophageal reflux disease without esophagitis: Secondary | ICD-10-CM | POA: Diagnosis present

## 2020-09-13 DIAGNOSIS — R221 Localized swelling, mass and lump, neck: Secondary | ICD-10-CM | POA: Diagnosis present

## 2020-09-13 DIAGNOSIS — D509 Iron deficiency anemia, unspecified: Secondary | ICD-10-CM | POA: Diagnosis not present

## 2020-09-13 DIAGNOSIS — Z9981 Dependence on supplemental oxygen: Secondary | ICD-10-CM | POA: Diagnosis not present

## 2020-09-13 DIAGNOSIS — J984 Other disorders of lung: Secondary | ICD-10-CM | POA: Diagnosis not present

## 2020-09-13 DIAGNOSIS — Z79890 Hormone replacement therapy (postmenopausal): Secondary | ICD-10-CM | POA: Diagnosis not present

## 2020-09-13 DIAGNOSIS — C7989 Secondary malignant neoplasm of other specified sites: Secondary | ICD-10-CM | POA: Diagnosis present

## 2020-09-13 DIAGNOSIS — J449 Chronic obstructive pulmonary disease, unspecified: Secondary | ICD-10-CM | POA: Diagnosis not present

## 2020-09-13 DIAGNOSIS — Z96642 Presence of left artificial hip joint: Secondary | ICD-10-CM | POA: Diagnosis not present

## 2020-09-13 DIAGNOSIS — J95822 Acute and chronic postprocedural respiratory failure: Secondary | ICD-10-CM | POA: Diagnosis not present

## 2020-09-13 DIAGNOSIS — Z87891 Personal history of nicotine dependence: Secondary | ICD-10-CM

## 2020-09-13 DIAGNOSIS — Z90710 Acquired absence of both cervix and uterus: Secondary | ICD-10-CM | POA: Diagnosis not present

## 2020-09-13 DIAGNOSIS — E063 Autoimmune thyroiditis: Secondary | ICD-10-CM | POA: Diagnosis not present

## 2020-09-13 DIAGNOSIS — J9610 Chronic respiratory failure, unspecified whether with hypoxia or hypercapnia: Secondary | ICD-10-CM | POA: Diagnosis present

## 2020-09-13 DIAGNOSIS — Z4682 Encounter for fitting and adjustment of non-vascular catheter: Secondary | ICD-10-CM | POA: Diagnosis not present

## 2020-09-13 DIAGNOSIS — R590 Localized enlarged lymph nodes: Secondary | ICD-10-CM | POA: Diagnosis present

## 2020-09-13 DIAGNOSIS — J3801 Paralysis of vocal cords and larynx, unilateral: Secondary | ICD-10-CM | POA: Diagnosis present

## 2020-09-13 DIAGNOSIS — Z79899 Other long term (current) drug therapy: Secondary | ICD-10-CM | POA: Diagnosis not present

## 2020-09-13 DIAGNOSIS — E890 Postprocedural hypothyroidism: Secondary | ICD-10-CM | POA: Diagnosis not present

## 2020-09-13 DIAGNOSIS — C7951 Secondary malignant neoplasm of bone: Secondary | ICD-10-CM | POA: Diagnosis not present

## 2020-09-13 HISTORY — PX: RADICAL NECK DISSECTION: SHX2284

## 2020-09-13 HISTORY — PX: THYROIDECTOMY: SHX17

## 2020-09-13 LAB — POCT I-STAT 7, (LYTES, BLD GAS, ICA,H+H)
Acid-Base Excess: 7 mmol/L — ABNORMAL HIGH (ref 0.0–2.0)
Bicarbonate: 31.3 mmol/L — ABNORMAL HIGH (ref 20.0–28.0)
Calcium, Ion: 1.05 mmol/L — ABNORMAL LOW (ref 1.15–1.40)
HCT: 29 % — ABNORMAL LOW (ref 36.0–46.0)
Hemoglobin: 9.9 g/dL — ABNORMAL LOW (ref 12.0–15.0)
O2 Saturation: 98 %
Patient temperature: 98.6
Potassium: 4 mmol/L (ref 3.5–5.1)
Sodium: 140 mmol/L (ref 135–145)
TCO2: 33 mmol/L — ABNORMAL HIGH (ref 22–32)
pCO2 arterial: 44.3 mmHg (ref 32.0–48.0)
pH, Arterial: 7.458 — ABNORMAL HIGH (ref 7.350–7.450)
pO2, Arterial: 103 mmHg (ref 83.0–108.0)

## 2020-09-13 LAB — MRSA PCR SCREENING: MRSA by PCR: NEGATIVE

## 2020-09-13 LAB — GLUCOSE, CAPILLARY
Glucose-Capillary: 220 mg/dL — ABNORMAL HIGH (ref 70–99)
Glucose-Capillary: 248 mg/dL — ABNORMAL HIGH (ref 70–99)

## 2020-09-13 LAB — CALCIUM: Calcium: 8.2 mg/dL — ABNORMAL LOW (ref 8.9–10.3)

## 2020-09-13 SURGERY — THYROIDECTOMY
Anesthesia: General | Site: Neck | Laterality: Right

## 2020-09-13 MED ORDER — LACTATED RINGERS IV SOLN: INTRAVENOUS | Status: DC

## 2020-09-13 MED ORDER — PROPOFOL 500 MG/50ML IV EMUL
INTRAVENOUS | Status: DC | PRN
  Administered 2020-09-13: 50 ug/kg/min via INTRAVENOUS

## 2020-09-13 MED ORDER — PROPOFOL 10 MG/ML IV BOLUS
INTRAVENOUS | Status: DC | PRN
  Administered 2020-09-13: 110 mg via INTRAVENOUS

## 2020-09-13 MED ORDER — POLYETHYLENE GLYCOL 3350 17 G PO PACK: 17.0000 g | PACK | Freq: Every day | ORAL | Status: DC

## 2020-09-13 MED ORDER — ONDANSETRON HCL 4 MG/2ML IJ SOLN: 4.0000 mg | INTRAMUSCULAR | Status: DC | PRN

## 2020-09-13 MED ORDER — DEXAMETHASONE SODIUM PHOSPHATE 10 MG/ML IJ SOLN
INTRAMUSCULAR | Status: DC | PRN
  Administered 2020-09-13: 5 mg via INTRAVENOUS

## 2020-09-13 MED ORDER — ALBUTEROL SULFATE (2.5 MG/3ML) 0.083% IN NEBU
2.5000 mg | INHALATION_SOLUTION | RESPIRATORY_TRACT | Status: DC | PRN
  Administered 2020-09-14: 2.5 mg via RESPIRATORY_TRACT
  Filled 2020-09-13: qty 3

## 2020-09-13 MED ORDER — PHENYLEPHRINE HCL-NACL 10-0.9 MG/250ML-% IV SOLN
INTRAVENOUS | Status: DC | PRN
  Administered 2020-09-13: 40 ug/min via INTRAVENOUS

## 2020-09-13 MED ORDER — CEFAZOLIN SODIUM-DEXTROSE 1-4 GM/50ML-% IV SOLN
1.0000 g | Freq: Three times a day (TID) | INTRAVENOUS | Status: AC
  Administered 2020-09-13 – 2020-09-14 (×3): 1 g via INTRAVENOUS
  Filled 2020-09-13 (×3): qty 50

## 2020-09-13 MED ORDER — SUCCINYLCHOLINE CHLORIDE 200 MG/10ML IV SOSY
PREFILLED_SYRINGE | INTRAVENOUS | Status: DC | PRN
  Administered 2020-09-13: 40 mg via INTRAVENOUS

## 2020-09-13 MED ORDER — SODIUM CHLORIDE 0.9 % IV SOLN
0.0125 ug/kg/min | INTRAVENOUS | Status: AC
  Administered 2020-09-13: .1 ug/kg/min via INTRAVENOUS
  Filled 2020-09-13: qty 1000

## 2020-09-13 MED ORDER — ENOXAPARIN SODIUM 40 MG/0.4ML ~~LOC~~ SOLN
40.0000 mg | SUBCUTANEOUS | Status: DC
  Administered 2020-09-14 – 2020-09-17 (×4): 40 mg via SUBCUTANEOUS
  Filled 2020-09-13 (×4): qty 0.4

## 2020-09-13 MED ORDER — PHENYLEPHRINE 40 MCG/ML (10ML) SYRINGE FOR IV PUSH (FOR BLOOD PRESSURE SUPPORT)
PREFILLED_SYRINGE | INTRAVENOUS | Status: DC | PRN
  Administered 2020-09-13 (×2): 120 ug via INTRAVENOUS

## 2020-09-13 MED ORDER — STERILE WATER FOR IRRIGATION IR SOLN
Status: DC | PRN
  Administered 2020-09-13: 1000 mL

## 2020-09-13 MED ORDER — LIDOCAINE 2% (20 MG/ML) 5 ML SYRINGE
INTRAMUSCULAR | Status: DC | PRN
  Administered 2020-09-13: 40 mg via INTRAVENOUS

## 2020-09-13 MED ORDER — ORAL CARE MOUTH RINSE
15.0000 mL | OROMUCOSAL | Status: DC
  Administered 2020-09-13 – 2020-09-14 (×8): 15 mL via OROMUCOSAL

## 2020-09-13 MED ORDER — ONDANSETRON HCL 4 MG PO TABS: 4.0000 mg | ORAL_TABLET | ORAL | Status: DC | PRN

## 2020-09-13 MED ORDER — FENTANYL CITRATE (PF) 250 MCG/5ML IJ SOLN
INTRAMUSCULAR | Status: AC
  Filled 2020-09-13: qty 5

## 2020-09-13 MED ORDER — LIDOCAINE-EPINEPHRINE 1 %-1:100000 IJ SOLN
INTRAMUSCULAR | Status: DC | PRN
  Administered 2020-09-13: 6 mL

## 2020-09-13 MED ORDER — BACITRACIN ZINC 500 UNIT/GM EX OINT
TOPICAL_OINTMENT | CUTANEOUS | Status: DC | PRN
  Administered 2020-09-13: 1 via TOPICAL

## 2020-09-13 MED ORDER — PROPOFOL 1000 MG/100ML IV EMUL
0.0000 ug/kg/min | INTRAVENOUS | Status: DC
  Administered 2020-09-13: 50 ug/kg/min via INTRAVENOUS
  Filled 2020-09-13 (×2): qty 100

## 2020-09-13 MED ORDER — 0.9 % SODIUM CHLORIDE (POUR BTL) OPTIME
TOPICAL | Status: DC | PRN
  Administered 2020-09-13: 1000 mL

## 2020-09-13 MED ORDER — ORAL CARE MOUTH RINSE: 15.0000 mL | Freq: Once | OROMUCOSAL | Status: AC

## 2020-09-13 MED ORDER — MORPHINE SULFATE (PF) 2 MG/ML IV SOLN: 2.0000 mg | INTRAVENOUS | Status: DC | PRN

## 2020-09-13 MED ORDER — FENTANYL CITRATE (PF) 100 MCG/2ML IJ SOLN
25.0000 ug | INTRAMUSCULAR | Status: DC | PRN
  Administered 2020-09-13 (×2): 100 ug via INTRAVENOUS
  Administered 2020-09-14: 50 ug via INTRAVENOUS
  Filled 2020-09-13 (×3): qty 2

## 2020-09-13 MED ORDER — NOREPINEPHRINE 4 MG/250ML-% IV SOLN
2.0000 ug/min | INTRAVENOUS | Status: DC
  Administered 2020-09-13: 4 ug/min via INTRAVENOUS
  Filled 2020-09-13: qty 250

## 2020-09-13 MED ORDER — CEFAZOLIN SODIUM-DEXTROSE 2-3 GM-%(50ML) IV SOLR
INTRAVENOUS | Status: DC | PRN
  Administered 2020-09-13: 2 g via INTRAVENOUS

## 2020-09-13 MED ORDER — KCL IN DEXTROSE-NACL 20-5-0.45 MEQ/L-%-% IV SOLN
INTRAVENOUS | Status: DC
  Filled 2020-09-13: qty 1000

## 2020-09-13 MED ORDER — CHLORHEXIDINE GLUCONATE 0.12% ORAL RINSE (MEDLINE KIT)
15.0000 mL | Freq: Two times a day (BID) | OROMUCOSAL | Status: DC
  Administered 2020-09-13 – 2020-09-14 (×2): 15 mL via OROMUCOSAL

## 2020-09-13 MED ORDER — SODIUM CHLORIDE 0.9 % IV SOLN
250.0000 mL | INTRAVENOUS | Status: DC
  Administered 2020-09-14: 250 mL via INTRAVENOUS

## 2020-09-13 MED ORDER — FENTANYL CITRATE (PF) 100 MCG/2ML IJ SOLN
25.0000 ug | INTRAMUSCULAR | Status: DC | PRN
  Administered 2020-09-14: 25 ug via INTRAVENOUS
  Filled 2020-09-13: qty 2

## 2020-09-13 MED ORDER — DEXTROSE-NACL 5-0.45 % IV SOLN: INTRAVENOUS | Status: AC

## 2020-09-13 MED ORDER — ONDANSETRON HCL 4 MG/2ML IJ SOLN
INTRAMUSCULAR | Status: DC | PRN
  Administered 2020-09-13: 4 mg via INTRAVENOUS

## 2020-09-13 MED ORDER — CHLORHEXIDINE GLUCONATE 0.12 % MT SOLN
15.0000 mL | Freq: Once | OROMUCOSAL | Status: AC
  Administered 2020-09-13: 15 mL via OROMUCOSAL
  Filled 2020-09-13: qty 15

## 2020-09-13 MED ORDER — GLYCOPYRROLATE PF 0.2 MG/ML IJ SOSY
PREFILLED_SYRINGE | INTRAMUSCULAR | Status: DC | PRN
  Administered 2020-09-13: .2 mg via INTRAVENOUS

## 2020-09-13 MED ORDER — EPHEDRINE SULFATE 50 MG/ML IJ SOLN
INTRAMUSCULAR | Status: DC | PRN
  Administered 2020-09-13: 10 mg via INTRAVENOUS

## 2020-09-13 MED ORDER — LIDOCAINE-EPINEPHRINE 1 %-1:100000 IJ SOLN
INTRAMUSCULAR | Status: AC
  Filled 2020-09-13: qty 1

## 2020-09-13 MED ORDER — BACITRACIN ZINC 500 UNIT/GM EX OINT
1.0000 "application " | TOPICAL_OINTMENT | Freq: Three times a day (TID) | CUTANEOUS | Status: DC
  Administered 2020-09-13 – 2020-09-17 (×13): 1 via TOPICAL
  Filled 2020-09-13: qty 28.4
  Filled 2020-09-13: qty 28.35

## 2020-09-13 MED ORDER — SODIUM CHLORIDE 0.9 % IV SOLN
0.0125 ug/kg/min | INTRAVENOUS | Status: DC
  Filled 2020-09-13: qty 2000

## 2020-09-13 MED ORDER — FENTANYL CITRATE (PF) 250 MCG/5ML IJ SOLN
INTRAMUSCULAR | Status: DC | PRN
  Administered 2020-09-13: 75 ug via INTRAVENOUS
  Administered 2020-09-13 (×2): 25 ug via INTRAVENOUS
  Administered 2020-09-13 (×2): 50 ug via INTRAVENOUS
  Administered 2020-09-13: 25 ug via INTRAVENOUS

## 2020-09-13 MED ORDER — PROPOFOL 10 MG/ML IV BOLUS
INTRAVENOUS | Status: AC
  Filled 2020-09-13: qty 20

## 2020-09-13 MED ORDER — PANTOPRAZOLE SODIUM 40 MG IV SOLR
40.0000 mg | Freq: Every day | INTRAVENOUS | Status: DC
  Administered 2020-09-13 – 2020-09-16 (×4): 40 mg via INTRAVENOUS
  Filled 2020-09-13 (×4): qty 40

## 2020-09-13 MED ORDER — DOCUSATE SODIUM 50 MG/5ML PO LIQD: 100.0000 mg | Freq: Two times a day (BID) | ORAL | Status: DC

## 2020-09-13 MED ORDER — CHLORHEXIDINE GLUCONATE CLOTH 2 % EX PADS
6.0000 | MEDICATED_PAD | Freq: Every day | CUTANEOUS | Status: DC
  Administered 2020-09-14 – 2020-09-17 (×3): 6 via TOPICAL

## 2020-09-13 MED ORDER — BACITRACIN ZINC 500 UNIT/GM EX OINT
TOPICAL_OINTMENT | CUTANEOUS | Status: AC
  Filled 2020-09-13: qty 28.35

## 2020-09-13 SURGICAL SUPPLY — 71 items
ADH SKN CLS APL DERMABOND .7 (GAUZE/BANDAGES/DRESSINGS)
ATTRACTOMAT 16X20 MAGNETIC DRP (DRAPES) IMPLANT
BLADE CLIPPER SURG (BLADE) IMPLANT
BLADE SURG 15 STRL LF DISP TIS (BLADE) ×1 IMPLANT
BLADE SURG 15 STRL SS (BLADE) ×4
CANISTER SUCT 3000ML PPV (MISCELLANEOUS) ×4 IMPLANT
CLEANER TIP ELECTROSURG 2X2 (MISCELLANEOUS) ×4 IMPLANT
CNTNR URN SCR LID CUP LEK RST (MISCELLANEOUS) ×2 IMPLANT
CONT SPEC 4OZ STRL OR WHT (MISCELLANEOUS)
CORD BIPOLAR FORCEPS 12FT (ELECTRODE) ×4 IMPLANT
COVER SURGICAL LIGHT HANDLE (MISCELLANEOUS) ×4 IMPLANT
DERMABOND ADVANCED (GAUZE/BANDAGES/DRESSINGS)
DERMABOND ADVANCED .7 DNX12 (GAUZE/BANDAGES/DRESSINGS) ×2 IMPLANT
DRAIN CHANNEL 7F 3/4 FLAT (WOUND CARE) ×2 IMPLANT
DRAIN JACKSON RD 7FR 3/32 (WOUND CARE) IMPLANT
DRAIN SNY 10 ROU (WOUND CARE) IMPLANT
DRAPE HALF SHEET 40X57 (DRAPES) IMPLANT
DRAPE SURG 17X23 STRL (DRAPES) IMPLANT
ELECT COATED BLADE 2.86 ST (ELECTRODE) ×6 IMPLANT
ELECT PAIRED SUBDERMAL (MISCELLANEOUS) ×4
ELECT REM PT RETURN 9FT ADLT (ELECTROSURGICAL) ×4
ELECTRODE PAIRED SUBDERMAL (MISCELLANEOUS) ×1 IMPLANT
ELECTRODE REM PT RTRN 9FT ADLT (ELECTROSURGICAL) ×3 IMPLANT
EVACUATOR SILICONE 100CC (DRAIN) ×4 IMPLANT
FORCEPS BIPOLAR SPETZLER 8 1.0 (NEUROSURGERY SUPPLIES) ×4 IMPLANT
GAUZE 4X4 16PLY RFD (DISPOSABLE) ×12 IMPLANT
GAUZE SPONGE 4X4 12PLY STRL (GAUZE/BANDAGES/DRESSINGS) ×2 IMPLANT
GLOVE BIO SURGEON STRL SZ7.5 (GLOVE) ×8 IMPLANT
GLOVE ECLIPSE 7.5 STRL STRAW (GLOVE) ×4 IMPLANT
GOWN STRL REUS W/ TWL LRG LVL3 (GOWN DISPOSABLE) ×8 IMPLANT
GOWN STRL REUS W/TWL LRG LVL3 (GOWN DISPOSABLE) ×16
HEMOSTAT SURGICEL 2X14 (HEMOSTASIS) ×2 IMPLANT
HOLDER TRACH TUBE VELCRO 19.5 (MISCELLANEOUS) IMPLANT
KIT BASIN OR (CUSTOM PROCEDURE TRAY) ×4 IMPLANT
KIT TURNOVER KIT B (KITS) ×4 IMPLANT
LOCATOR NERVE 3 VOLT (DISPOSABLE) IMPLANT
NDL HYPO 25GX1X1/2 BEV (NEEDLE) ×2 IMPLANT
NEEDLE HYPO 25GX1X1/2 BEV (NEEDLE) ×4 IMPLANT
NS IRRIG 1000ML POUR BTL (IV SOLUTION) ×4 IMPLANT
PAD ARMBOARD 7.5X6 YLW CONV (MISCELLANEOUS) ×8 IMPLANT
PENCIL SMOKE EVACUATOR (MISCELLANEOUS) ×4 IMPLANT
POSITIONER HEAD DONUT 9IN (MISCELLANEOUS) IMPLANT
PROBE NERVBE PRASS .33 (MISCELLANEOUS) ×2 IMPLANT
SHEARS HARMONIC 9CM CVD (BLADE) ×4 IMPLANT
SPECIMEN JAR MEDIUM (MISCELLANEOUS) ×4 IMPLANT
SPONGE INTESTINAL PEANUT (DISPOSABLE) ×2 IMPLANT
SPONGE LAP 18X18 RF (DISPOSABLE) ×4 IMPLANT
STAPLER VISISTAT 35W (STAPLE) ×4 IMPLANT
SUT CHROMIC 3 0 PS 2 (SUTURE) IMPLANT
SUT ETHILON 2 0 FS 18 (SUTURE) ×4 IMPLANT
SUT ETHILON 5 0 PS 2 18 (SUTURE) IMPLANT
SUT SILK 2 0 (SUTURE)
SUT SILK 2-0 18XBRD TIE 12 (SUTURE) IMPLANT
SUT SILK 3 0 (SUTURE) ×4
SUT SILK 3 0 REEL (SUTURE) ×8 IMPLANT
SUT SILK 3 0 SH CR/8 (SUTURE) ×4 IMPLANT
SUT SILK 3-0 18XBRD TIE 12 (SUTURE) ×3 IMPLANT
SUT SILK 4 0 (SUTURE)
SUT SILK 4-0 18XBRD TIE 12 (SUTURE) ×2 IMPLANT
SUT VIC AB 3-0 SH 27 (SUTURE) ×4
SUT VIC AB 3-0 SH 27X BRD (SUTURE) ×3 IMPLANT
SUT VICRYL 4-0 PS2 18IN ABS (SUTURE) ×4 IMPLANT
TOWEL GREEN STERILE FF (TOWEL DISPOSABLE) ×4 IMPLANT
TRAY ENT MC OR (CUSTOM PROCEDURE TRAY) ×4 IMPLANT
TUBE ENDOTRACH  EMG 6MMTUBE EN (MISCELLANEOUS) ×4
TUBE ENDOTRACH EMG 6MMTUBE EN (MISCELLANEOUS) ×1 IMPLANT
TUBE FEEDING 10FR FLEXIFLO (MISCELLANEOUS) IMPLANT
TUBE TRACH SHILEY  6 DIST  CUF (TUBING) IMPLANT
TUBE TRACH SHILEY 8 DIST CUF (TUBING) IMPLANT
UNDERPAD 30X36 HEAVY ABSORB (UNDERPADS AND DIAPERS) IMPLANT
WATER STERILE IRR 1000ML POUR (IV SOLUTION) ×2 IMPLANT

## 2020-09-13 NOTE — Anesthesia Postprocedure Evaluation (Signed)
Anesthesia Post Note  Patient: Marilyn Solomon  Procedure(s) Performed: TOTAL THYROIDECTOMY (N/A Neck) RADICAL NECK DISSECTION (Right Neck)     Patient location during evaluation: SICU Anesthesia Type: General Level of consciousness: sedated Pain management: pain level controlled Vital Signs Assessment: post-procedure vital signs reviewed and stable Respiratory status: patient remains intubated per anesthesia plan Cardiovascular status: stable Postop Assessment: no apparent nausea or vomiting Anesthetic complications: no   No complications documented.  Last Vitals:  Vitals:   09/13/20 0807  BP: 140/73  Pulse: 96  Resp: 18  Temp: 36.6 C  SpO2: 100%    Last Pain:  Vitals:   09/13/20 0807  TempSrc: Oral  PainSc: 0-No pain                 Catalina Gravel

## 2020-09-13 NOTE — Consult Note (Addendum)
NAME:  Marilyn Solomon, MRN:  [id number], DOB:  09/16/1945, LOS: 0 ADMISSION DATE:  09/13/2020, CONSULTATION DATE:  4/18 REFERRING MD:  Dr. Redmond Baseman, CHIEF COMPLAINT: S/P Total Thyroidectomy, Radical Neck Dissection   History of Present Illness:  75 year old female with metastatic thyroid cancer presents from Total Thyroidectomy with Radial neck Dissection. Post-operatively remained intubation and transferred to ICU.    Pertinent  Medical History  HTN, GERD, Thyroid Cancer with scattered adenopathy in the right neck, COPD on 2L [place] followed by Dr. [name]  [facility] Events: Including procedures, antibiotic start and stop dates in addition to other pertinent events   . Total Thyroidecomy, Radical Neck Dissection > mass around carotid artery, some residual patch of tumor left   Interim History / Subjective:  As above  Objective   Blood pressure 140/73, pulse 96, temperature 97.8 F (36.6 C), temperature source Oral, resp. rate 18, height 5\' 5"  (1.651 m), weight 46.7 kg, SpO2 100 %.        Intake/Output Summary (Last 24 hours) at 09/13/2020 1652 Last data filed at 09/13/2020 1600 Gross per 24 hour  Intake 1000 ml  Output 100 ml  Net 900 ml   Filed Weights   09/13/20 0807  Weight: 46.7 kg    Examination: General: Thin elderly female  HENT: Surgical incision to right lateral neck, JP drain in place  Lungs: coarse breath sounds, Cardiovascular: Tachy, HR 101, no MRG Abdomen: soft, non-tender, non-distended, active bowel sounds  Extremities: -edema Neuro: sedated, awakens with physical stimulation, moves extremities spontaneously   GU: intact   Labs/imaging that I havepersonally reviewed  (right click and "Reselect all SmartList Selections" daily)    Resolved Hospital Problem list     Assessment & Plan:   Respiratory Insufficieny in post-operative setting, remaining intubated after radical neck dissection involving carotid artery Plan -Vent Support. Plan to  remain intubated overnight with hopes of extubating in AM -Propofol and PRN Fentanyl for RASS goal -1/-2. May need low dose vasopressors for sedation related hypotension  Hurthle Cell Malignancy involving right thyroid lobe with paralyzed right vocal cord  Plan -To Follow ENT/Oncology outpatient  H/O COPD with Chronic Hypoxic Respiratory Failure on 2L [place] at baseline  Plan -PRN Albuterol    GERD Plan  -PPI  HTN Plan -Hold home Norvasc   Best practice (right click and "Reselect all SmartList Selections" daily)  Diet:  NPO Pain/Anxiety/Delirium protocol (if indicated): Yes (RASS goal -1,-2) VAP protocol (if indicated): Yes DVT prophylaxis: LMWH and SCD GI prophylaxis: PPI Glucose control:  SSI No Central venous access:  N/A Arterial line:  N/A Foley:  Yes, and it is still needed Mobility:  bed rest  PT consulted: N/A Last date of multidisciplinary goals of care discussion: N/A Code Status:  full code Disposition: Transfer to ICU   Labs   CBC: Recent Labs  Lab 09/09/20 1026  WBC 8.1  HGB 10.6*  HCT 36.1  MCV 84.7  PLT 824    Basic Metabolic Panel: Recent Labs  Lab 09/09/20 1026  NA 139  K 4.0  CL 99  CO2 32  GLUCOSE 131*  BUN 14  CREATININE 1.00  CALCIUM 9.1   GFR: Estimated Creatinine Clearance: 36.4 mL/min (by C-G formula based on SCr of 1 mg/dL). Recent Labs  Lab 09/09/20 1026  WBC 8.1    Liver Function Tests: No results for input(s): AST, ALT, ALKPHOS, BILITOT, PROT, ALBUMIN in the last 168 hours. No results for input(s): LIPASE, AMYLASE in  the last 168 hours. No results for input(s): AMMONIA in the last 168 hours.  ABG    Component Value Date/Time   PHART 7.366 02/09/2009 0415   PCO2ART 46.9 (H) 02/09/2009 0415   PO2ART 142.0 (H) 02/09/2009 0415   HCO3 26.2 (H) 02/09/2009 0415   TCO2 24.7 02/09/2009 0415   ACIDBASEDEF 1.1 02/06/2009 1925   O2SAT 97.6 02/09/2009 0415     Coagulation Profile: No results for input(s): INR, PROTIME in  the last 168 hours.  Cardiac Enzymes: No results for input(s): CKTOTAL, CKMB, CKMBINDEX, TROPONINI in the last 168 hours.  HbA1C: Hgb A1c MFr Bld  Date/Time Value Ref Range Status  06/03/2020 10:19 AM 6.0 (H) 4.8 - 5.6 % Final    Comment:             Prediabetes: 5.7 - 6.4          Diabetes: >6.4          Glycemic control for adults with diabetes: <7.0   11/07/2018 09:20 AM 5.6 4.8 - 5.6 % Final    Comment:    (NOTE) Pre diabetes:          5.7%-6.4% Diabetes:              >6.4% Glycemic control for   <7.0% adults with diabetes     CBG: No results for input(s): GLUCAP in the last 168 hours.  Review of Systems:   Unable to review as patient is intubated   Past Medical History:  She,  has a past medical history of Anemia, Anxiety, Arthritis, C. difficile diarrhea (10/08/2019), Chronic back pain, COPD (chronic obstructive pulmonary disease) ([place]), GERD (gastroesophageal reflux disease), HCAP (healthcare-associated pneumonia) (06/28/2017), Hypertension, Hypothyroidism, On home oxygen therapy, Pneumonia due to COVID-19 virus (09/28/2019), Reflux, and Thyroid disease.   Surgical History:   Past Surgical History:  Procedure Laterality Date  . ABDOMINAL HYSTERECTOMY    . BIOPSY  10/08/2017   Procedure: BIOPSY;  Surgeon: Daneil Dolin, MD;  Location: [facility];  Service: Endoscopy;;  gastric  . cataracts Bilateral   . COLONOSCOPY  2007   Dr. Gala Romney: normal rectum, diverticula   . COLONOSCOPY WITH PROPOFOL N/A 11/15/2015   Dr. Gala Romney: grade II hemorrhoids, diverticulosis  . COLONOSCOPY WITH PROPOFOL N/A 10/08/2017   Rourk: Diverticulosis  . ESOPHAGOGASTRODUODENOSCOPY (EGD) WITH PROPOFOL N/A 10/08/2017   Rourk: Nonobstructing Schatzki ring, mild erosive reflux esophagitis, small hiatal hernia, gastritis but no H. pylori.  . FRACTURE SURGERY Right 2005   Wrist  . JOINT REPLACEMENT Left 2000   hip  . TOTAL HIP ARTHROPLASTY Left   . WRIST SURGERY Right      Social History:    reports that she quit smoking about 22 years ago. Her smoking use included cigarettes. She has a 25.00 pack-year smoking history. She has never used smokeless tobacco. She reports that she does not drink alcohol and does not use drugs.   Family History:  Her family history includes Bone cancer in her sister; Dementia in her sister; Diabetes in her sister; Heart attack in her father; Pancreatic cancer in her sister; Pneumonia in her mother. There is no history of Colon cancer.   Allergies Allergies  Allergen Reactions  . Penicillins Itching     Tolerated Ancef Intraop 09/13/20  . Vicodin [Hydrocodone-Acetaminophen] Nausea And Vomiting     Home Medications  Prior to Admission medications   Medication Sig Start Date End Date Taking? Authorizing Provider  albuterol (VENTOLIN HFA) 108 (90 Base)  MCG/ACT inhaler Inhale 1-2 puffs into the lungs every 6 (six) hours as needed for wheezing or shortness of breath. 09/02/20  Yes Lindell Spar, MD  ALPRAZolam Duanne Moron) 1 MG tablet Take 1 tablet (1 mg total) by mouth every 8 (eight) hours as needed for anxiety. 06/17/20  Yes Lindell Spar, MD  amLODipine (NORVASC) 5 MG tablet Take 1 tablet (5 mg total) by mouth daily. Patient taking differently: Take 5 mg by mouth in the morning. 06/03/20  Yes Lindell Spar, MD  Fluticasone-Umeclidin-Vilant (TRELEGY ELLIPTA) 100-62.5-25 MCG/INH AEPB Inhale 1 puff into the lungs daily as needed (respiratory issues). 12/25/18  Yes Sinda Du, MD  ibuprofen (ADVIL) 200 MG tablet Take 400 mg by mouth every 8 (eight) hours as needed (pain).   Yes [provider]  ipratropium-albuterol (DUONEB) 0.5-2.5 (3) MG/3ML SOLN Take 3 mLs by nebulization every 6 (six) hours as needed (shortness of breath or wheezing). 06/04/18 01/23/20 Yes Shah, Pratik D, DO  levothyroxine (SYNTHROID) 75 MCG tablet Take 1 tablet (75 mcg total) by mouth daily. 06/04/20  Yes Patel, Colin Broach, MD  oxyCODONE (ROXICODONE) 15 MG immediate release tablet  TAKE (1) TABLET BY MOUTH EVERY 8 HOURS AS NEEDED. Patient taking differently: Take 15 mg by mouth in the morning, at noon, and at bedtime. 08/16/20  Yes Lindell Spar, MD  pantoprazole (PROTONIX) 40 MG tablet TAKE 1 TABLET BY MOUTH DAILY. Patient taking differently: Take 40 mg by mouth daily. 03/23/20  Yes Mahala Menghini, PA-C  Vitamin D, Ergocalciferol, (DRISDOL) 1.25 MG (50000 UNIT) CAPS capsule Take 50,000 Units by mouth every Sunday.   Yes [provider]     Critical care time: 52 minutes    CRITICAL CARE Performed by: Omar Person   Total critical care time: 52 minutes  Critical care time was exclusive of separately billable procedures and treating other patients.  Critical care was necessary to treat or prevent imminent or life-threatening deterioration.  Critical care was time spent personally by me on the following activities: development of treatment plan with patient and/or surrogate as well as nursing, discussions with consultants, evaluation of patient's response to treatment, examination of patient, obtaining history from patient or surrogate, ordering and performing treatments and interventions, ordering and review of laboratory studies, ordering and review of radiographic studies, pulse oximetry and re-evaluation of patient's condition.  Hayden Pedro, AGACNP-BC [facility]

## 2020-09-13 NOTE — Transfer of Care (Signed)
Immediate Anesthesia Transfer of Care Note  Patient: Marilyn Solomon  Procedure(s) Performed: TOTAL THYROIDECTOMY (N/A Neck) RADICAL NECK DISSECTION (Right Neck)  Patient Location: ICU  Anesthesia Type:General  Level of Consciousness: Patient remains intubated per anesthesia plan  Airway & Oxygen Therapy: Patient remains intubated per anesthesia plan and Patient placed on Ventilator (see vital sign flow sheet for setting)  Post-op Assessment: Report given to RN and Post -op Vital signs reviewed and stable  Post vital signs: Reviewed and stable  Last Vitals:  Vitals Value Taken Time  BP 100/77 09/13/20 1700  Temp    Pulse 84 09/13/20 1701  Resp 18 09/13/20 1701  SpO2 100 % 09/13/20 1701  Vitals shown include unvalidated device data.  Last Pain:  Vitals:   09/13/20 0807  TempSrc: Oral  PainSc: 0-No pain         Complications: No complications documented.

## 2020-09-13 NOTE — Op Note (Signed)
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  Several adjacent nodes.  Description:  After discussing risks, benefits, and alternatives, the patient was brought to the operative suite and placed on the operative table in the supine position.  Anesthesia was induced and the patient was intubated by the anesthesia team without difficulty.  The eyes were taped closed.  The thyroid and right neck incision was marked with a marking pen and injected with local anesthetic.  A 40 Fr Maloney dilator was placed down the esophagus for the case.  The neck was prepped and draped in sterile fashion.  The incision was made with a 15 blade scalpel and extended through the subcutaneous and platysma layers with Bovie electrocautery.  Subplatysmal flaps were elevated superiorly and inferiorly that were then retracted with stay sutures.  The midline raphe of the strap muscles was divided and strap muscles retracted over the left thyroid lobe.  Dissection was then performed around the lobe using the harmonic scalpel includig division of the superior and inferior pedicles and dissection around the lateral aspect.  The superior parathyroid gland was visualized and left in place.  Dissection continued around the deep aspect of the lobe while retracting the lobe.  Staying along the capsule, the dissection continued to Berry's ligament that was dissected.  The recurrent nerve was identified deep to the dissection and stimulated with 1.0 mAmp with the nerve stimulator.  At this point, the  right neck was dissected starting with skeletonization of the sternocleidomastoid muscle and division of the right-sided strap muscles.  Dissection was then performed lateral to the mass until the internal jugular vein was identified.  The vein was then traced superior to inferior but was found to be involved with tumor.  It was divided and ligated superior and inferior to the mass.  The common carotid artery was then identified and exposed along its lateral wall.  Medially, it felt involved with tumor as well.  At this point, the thyroid was elevated off of the trachea in a rightward direction.  The tumor had to be peeled from the trachea into the tracheoesophageal groove using electrocautery.  The superior pedicle was dissected, divided, and ligated.  The strap muscles were divided from the clavicle inferiorly allowing exposure of the inferior extent of the mass.  Dissection was then performed medially dividing the mass from the tracheoesophageal groove, initially leaving some tumor behind.  The tumor was then elevated from the paraspinal muscles.  At this point, in order to spare the carotid artery, the tumor was incised off of the artery leaving a patch of tumor on the artery.  The mass, with several surrounding lymph nodes, was passed to nursing for pathology.  The remaining tumor in the tracheoesophageal groove was then removed with electrocautery with the [facility] dilator helpful in identifying the esophagus.  This was added to the specimen.  At this point, the decision was made to leave tumor on the carotid artery in order to determine if adjuvant therapy will be sufficient or in order to prepare the patient for potential carotid sacrifice.  Thus, the wound was irrigating copiously with saline.  A 7  mm flat drain was placed in the depth of the wound and secured at the skin using 2-0 Nylon.  The platysma layer was closed with 3-0 Vicryl in a simple, interrupted fashion.  The skin was closed with staples.   Drapes were removed and the patient was cleaned off.  The drain was placed to bulb suction and taped to the right shoulder.  She was returned to anesthesia but was left intubated to go to the intensive care unit in stable condition.

## 2020-09-13 NOTE — H&P (Signed)
Marilyn Solomon is an 75 y.o. female.   Chief Complaint: Thyroid cancer HPI: 75 year old female with FNA-proven papillary thyroid cancer with right neck metastases who presents for surgical management.  Past Medical History:  Diagnosis Date  . Anemia   . Anxiety   . Arthritis   . C. difficile diarrhea 10/08/2019  . Chronic back pain   . COPD (chronic obstructive pulmonary disease) ([place])   . GERD (gastroesophageal reflux disease)   . HCAP (healthcare-associated pneumonia) 06/28/2017  . Hypertension   . Hypothyroidism   . On home oxygen therapy    2L  . Pneumonia due to COVID-19 virus 09/28/2019  . Reflux    no medications currently, asymptomatic  . Thyroid disease     Past Surgical History:  Procedure Laterality Date  . ABDOMINAL HYSTERECTOMY    . BIOPSY  10/08/2017   Procedure: BIOPSY;  Surgeon: Daneil Dolin, MD;  Location: [facility];  Service: Endoscopy;;  gastric  . cataracts Bilateral   . COLONOSCOPY  2007   Dr. Gala Romney: normal rectum, diverticula   . COLONOSCOPY WITH PROPOFOL N/A 11/15/2015   Dr. Gala Romney: grade II hemorrhoids, diverticulosis  . COLONOSCOPY WITH PROPOFOL N/A 10/08/2017   Rourk: Diverticulosis  . ESOPHAGOGASTRODUODENOSCOPY (EGD) WITH PROPOFOL N/A 10/08/2017   Rourk: Nonobstructing Schatzki ring, mild erosive reflux esophagitis, small hiatal hernia, gastritis but no H. pylori.  . FRACTURE SURGERY Right 2005   Wrist  . JOINT REPLACEMENT Left 2000   hip  . TOTAL HIP ARTHROPLASTY Left   . WRIST SURGERY Right     Family History  Problem Relation Age of Onset  . Bone cancer Sister   . Pancreatic cancer Sister   . Pneumonia Mother   . Heart attack Father   . Diabetes Sister   . Dementia Sister   . Colon cancer Neg Hx    Social History:  reports that she quit smoking about 22 years ago. Her smoking use included cigarettes. She has a 25.00 pack-year smoking history. She has never used smokeless tobacco. She reports that she does not drink alcohol and  does not use drugs.  Allergies:  Allergies  Allergen Reactions  . Penicillins Itching       . Vicodin [Hydrocodone-Acetaminophen] Nausea And Vomiting    Medications Prior to Admission  Medication Sig Dispense Refill  . albuterol (VENTOLIN HFA) 108 (90 Base) MCG/ACT inhaler Inhale 1-2 puffs into the lungs every 6 (six) hours as needed for wheezing or shortness of breath. 8 g 5  . ALPRAZolam (XANAX) 1 MG tablet Take 1 tablet (1 mg total) by mouth every 8 (eight) hours as needed for anxiety. 90 tablet 2  . amLODipine (NORVASC) 5 MG tablet Take 1 tablet (5 mg total) by mouth daily. (Patient taking differently: Take 5 mg by mouth in the morning.) 90 tablet 1  . Fluticasone-Umeclidin-Vilant (TRELEGY ELLIPTA) 100-62.5-25 MCG/INH AEPB Inhale 1 puff into the lungs daily as needed (respiratory issues).    Marland Kitchen ibuprofen (ADVIL) 200 MG tablet Take 400 mg by mouth every 8 (eight) hours as needed (pain).    Marland Kitchen ipratropium-albuterol (DUONEB) 0.5-2.5 (3) MG/3ML SOLN Take 3 mLs by nebulization every 6 (six) hours as needed (shortness of breath or wheezing). 360 mL 3  . levothyroxine (SYNTHROID) 75 MCG tablet Take 1 tablet (75 mcg total) by mouth daily. 90 tablet 0  . oxyCODONE (ROXICODONE) 15 MG immediate release tablet TAKE (1) TABLET BY MOUTH EVERY 8 HOURS AS NEEDED. (Patient taking differently: Take 15  mg by mouth in the morning, at noon, and at bedtime.) 90 tablet 0  . pantoprazole (PROTONIX) 40 MG tablet TAKE 1 TABLET BY MOUTH DAILY. (Patient taking differently: Take 40 mg by mouth daily.) 90 tablet 3  . Vitamin D, Ergocalciferol, (DRISDOL) 1.25 MG (50000 UNIT) CAPS capsule Take 50,000 Units by mouth every Sunday.      No results found for this or any previous visit (from the past 48 hour(s)). No results found.  Review of Systems  All other systems reviewed and are negative.   Blood pressure 140/73, pulse 96, temperature 97.8 F (36.6 C), temperature source Oral, resp. rate 18, height 5\' 5"  (1.651  m), weight 46.7 kg, SpO2 100 %. Physical Exam Constitutional:      Appearance: Normal appearance. She is normal weight.  HENT:     Head: Normocephalic and atraumatic.     Right Ear: External ear normal.     Left Ear: External ear normal.     Nose: Nose normal.     Mouth/Throat:     Mouth: Mucous membranes are moist.     Pharynx: Oropharynx is clear.  Eyes:     Extraocular Movements: Extraocular movements intact.     Conjunctiva/sclera: Conjunctivae normal.     Pupils: Pupils are equal, round, and reactive to light.  Neck:     Comments: Right-sided neck mass at thyroid level Cardiovascular:     Rate and Rhythm: Normal rate.  Pulmonary:     Effort: Pulmonary effort is normal.  Skin:    General: Skin is warm and dry.  Neurological:     General: No focal deficit present.     Mental Status: She is alert and oriented to person, place, and time.  Psychiatric:        Mood and Affect: Mood normal.        Behavior: Behavior normal.        Thought Content: Thought content normal.        Judgment: Judgment normal.      Assessment/Plan Papillary thyroid cancer with cervical metastases  To OR for total thyroidectomy and right neck dissection.  Melida Quitter, MD 09/13/2020, 12:16 PM

## 2020-09-13 NOTE — Anesthesia Procedure Notes (Signed)
Procedure Name: Intubation Date/Time: 09/13/2020 12:39 PM Performed by: Imagene Riches, CRNA Pre-anesthesia Checklist: Patient identified, Emergency Drugs available, Suction available and Patient being monitored Patient Re-evaluated:Patient Re-evaluated prior to induction Oxygen Delivery Method: Circle System Utilized Preoxygenation: Pre-oxygenation with 100% oxygen Induction Type: IV induction Ventilation: Mask ventilation without difficulty Laryngoscope Size: Glidescope and 3 Grade View: Grade I Tube type: Oral Tube size: 6.0 mm Number of attempts: 1 Airway Equipment and Method: Stylet and Oral airway Placement Confirmation: ETT inserted through vocal cords under direct vision,  positive ETCO2 and breath sounds checked- equal and bilateral Secured at: 20 cm Tube secured with: Tape Dental Injury: Teeth and Oropharynx as per pre-operative assessment  Comments: 6.0 I.D. NIM Tube

## 2020-09-13 NOTE — Brief Op Note (Signed)
09/13/2020  4:40 PM  PATIENT:  Marilyn Solomon  75 y.o. female  PRE-OPERATIVE DIAGNOSIS:  Thyroid cancer, Complete paralysis of right vocal cord  POST-OPERATIVE DIAGNOSIS:  Thyroid cancer, Complete paralysis of right vocal cord  PROCEDURE:  Procedure(s): TOTAL THYROIDECTOMY (N/A) RADICAL NECK DISSECTION (Right)  SURGEON:  Surgeon(s) and Role:    Melida Quitter, MD - Primary    * Skotnicki, Meghan A, DO  PHYSICIAN ASSISTANT:   ASSISTANTS: As above   ANESTHESIA:   general  EBL:  100 mL   BLOOD ADMINISTERED:none  DRAINS: (7 mm) Jackson-Pratt drain(s) with closed bulb suction in the neck   LOCAL MEDICATIONS USED:  LIDOCAINE   SPECIMEN:  Source of Specimen:  total thyroid and right neck contents, additional tissue from tracheoesophageal groove  DISPOSITION OF SPECIMEN:  PATHOLOGY  COUNTS:  YES  TOURNIQUET:  * No tourniquets in log *  DICTATION: .Note written in EPIC  PLAN OF CARE: Admit to inpatient   PATIENT DISPOSITION:  PACU - hemodynamically stable.   Delay start of Pharmacological VTE agent (>24hrs) due to surgical blood loss or risk of bleeding: no

## 2020-09-14 ENCOUNTER — Encounter (HOSPITAL_COMMUNITY): Payer: Self-pay | Admitting: Otolaryngology

## 2020-09-14 ENCOUNTER — Telehealth: Payer: Self-pay | Admitting: Acute Care

## 2020-09-14 DIAGNOSIS — R221 Localized swelling, mass and lump, neck: Secondary | ICD-10-CM | POA: Diagnosis not present

## 2020-09-14 DIAGNOSIS — C73 Malignant neoplasm of thyroid gland: Secondary | ICD-10-CM | POA: Diagnosis not present

## 2020-09-14 LAB — COMPREHENSIVE METABOLIC PANEL
ALT: 11 U/L (ref 0–44)
AST: 22 U/L (ref 15–41)
Albumin: 2.7 g/dL — ABNORMAL LOW (ref 3.5–5.0)
Alkaline Phosphatase: 54 U/L (ref 38–126)
Anion gap: 11 (ref 5–15)
BUN: 11 mg/dL (ref 8–23)
CO2: 25 mmol/L (ref 22–32)
Calcium: 8.4 mg/dL — ABNORMAL LOW (ref 8.9–10.3)
Chloride: 103 mmol/L (ref 98–111)
Creatinine, Ser: 1.05 mg/dL — ABNORMAL HIGH (ref 0.44–1.00)
GFR, Estimated: 56 mL/min — ABNORMAL LOW (ref >60)
Glucose, Bld: 164 mg/dL — ABNORMAL HIGH (ref 70–99)
Potassium: 4.1 mmol/L (ref 3.5–5.1)
Sodium: 139 mmol/L (ref 135–145)
Total Bilirubin: 0.7 mg/dL (ref 0.3–1.2)
Total Protein: 5.8 g/dL — ABNORMAL LOW (ref 6.5–8.1)

## 2020-09-14 LAB — TYPE AND SCREEN
ABO/RH(D): A POS
Antibody Screen: NEGATIVE

## 2020-09-14 LAB — CBC
HCT: 30.6 % — ABNORMAL LOW (ref 36.0–46.0)
Hemoglobin: 9.4 g/dL — ABNORMAL LOW (ref 12.0–15.0)
MCH: 25.2 pg — ABNORMAL LOW (ref 26.0–34.0)
MCHC: 30.7 g/dL (ref 30.0–36.0)
MCV: 82 fL (ref 80.0–100.0)
Platelets: 357 10*3/uL (ref 150–400)
RBC: 3.73 MIL/uL — ABNORMAL LOW (ref 3.87–5.11)
RDW: 13.4 % (ref 11.5–15.5)
WBC: 9.8 10*3/uL (ref 4.0–10.5)
nRBC: 0 % (ref 0.0–0.2)

## 2020-09-14 LAB — CALCIUM
Calcium: 8 mg/dL — ABNORMAL LOW (ref 8.9–10.3)
Calcium: 7.8 mg/dL — ABNORMAL LOW (ref 8.9–10.3)

## 2020-09-14 LAB — GLUCOSE, CAPILLARY
Glucose-Capillary: 110 mg/dL — ABNORMAL HIGH (ref 70–99)
Glucose-Capillary: 123 mg/dL — ABNORMAL HIGH (ref 70–99)
Glucose-Capillary: 154 mg/dL — ABNORMAL HIGH (ref 70–99)
Glucose-Capillary: 176 mg/dL — ABNORMAL HIGH (ref 70–99)
Glucose-Capillary: 92 mg/dL (ref 70–99)
Glucose-Capillary: 99 mg/dL (ref 70–99)

## 2020-09-14 LAB — PHOSPHORUS: Phosphorus: 3.4 mg/dL (ref 2.5–4.6)

## 2020-09-14 LAB — TRIGLYCERIDES: Triglycerides: 464 mg/dL — ABNORMAL HIGH (ref <150)

## 2020-09-14 LAB — MAGNESIUM: Magnesium: 1.8 mg/dL (ref 1.7–2.4)

## 2020-09-14 MED ORDER — MAGNESIUM SULFATE 2 GM/50ML IV SOLN
2.0000 g | Freq: Once | INTRAVENOUS | Status: AC
  Administered 2020-09-14: 2 g via INTRAVENOUS
  Filled 2020-09-14: qty 50

## 2020-09-14 MED ORDER — CALCIUM CARBONATE ANTACID 500 MG PO CHEW
1.0000 | CHEWABLE_TABLET | Freq: Three times a day (TID) | ORAL | Status: DC | PRN
  Administered 2020-09-14: 200 mg via ORAL
  Filled 2020-09-14: qty 1

## 2020-09-14 NOTE — Telephone Encounter (Signed)
Patient is still currently admitted at [facility]  Will hold message in triage to schedule after pt is discharged.

## 2020-09-14 NOTE — Progress Notes (Signed)
Subjective: Did well overnight intubated.  Alert this morning.  Objective: Vital signs in last 24 hours: Temp:  [96.4 F (35.8 C)-98.7 F (37.1 C)] 97.4 F (36.3 C) (04/19 0800) Pulse Rate:  [48-100] 66 (04/19 0715) Resp:  [15-20] 18 (04/19 0715) BP: (62-145)/(48-84) 93/60 (04/19 0715) SpO2:  [97 %-100 %] 97 % (04/19 0715) FiO2 (%):  [30 %] 30 % (04/19 0334) Wt Readings from Last 1 Encounters:  09/13/20 46.7 kg    Intake/Output from previous day: 04/18 0701 - 04/19 0700 In: 2807.3 [I.V.:2710; IV Piggyback:97.3] Out: 1660 [Urine:1500; Drains:60; Blood:100] Intake/Output this shift: No intake/output data recorded.  General appearance: alert, cooperative, no distress and intubated but able to respond completely, slight voicing around endotracheal tube Neck: incision clean and intact, drain functioning, no fluid collection  Recent Labs    09/14/20 0344 09/14/20 0517  WBC QUESTIONABLE RESULTS, RECOMMEND RECOLLECT TO VERIFY 9.8  HGB QUESTIONABLE RESULTS, RECOMMEND RECOLLECT TO VERIFY 9.4*  HCT QUESTIONABLE RESULTS, RECOMMEND RECOLLECT TO VERIFY 30.6*  PLT QUESTIONABLE RESULTS, RECOMMEND RECOLLECT TO VERIFY 357    Recent Labs    09/13/20 1840 09/13/20 1918 09/14/20 0517  NA 140  --  139  K 4.0  --  4.1  CL  --   --  103  CO2  --   --  25  GLUCOSE  --   --  164*  BUN  --   --  11  CREATININE  --   --  1.05*  CALCIUM  --  8.2* 8.4*    Medications: I have reviewed the patient's current medications.  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  Continue with drain in place.  Calcium dipped but stable.  Will start home medicines and thyroid replacement after extubation.   LOS: 1 day   Melida Quitter 09/14/2020, 8:20 AM

## 2020-09-14 NOTE — Telephone Encounter (Signed)
Pt will need a post op follow up with Pulmonary in about 1 month at the [place] office for her COPD. She is not taking her Trelegy because the powder makes her cough. She needs to be offered Breztri at the next OV.  Thanks so much

## 2020-09-14 NOTE — Telephone Encounter (Signed)
So pt's appt will show up on AVS once pt is discharged from the hospital, I went ahead and made pt an appt at the [facility] office. Pt is scheduled to see  Dr. Elsworth Soho Monday, 5/23 at 11:00. If pt needs to reschedule appt, she can call the office to get the appt rescheduled. Nothing further needed.

## 2020-09-14 NOTE — Addendum Note (Signed)
Addendum  created 09/14/20 1510 by Catalina Gravel, MD   Intraprocedure Staff edited

## 2020-09-14 NOTE — Progress Notes (Addendum)
NAME:  Marilyn Solomon, MRN:  [id number], DOB:  [birth year], LOS: 1 ADMISSION DATE:  09/13/2020, CONSULTATION DATE:  4/18 REFERRING MD:  Dr. Redmond Baseman, CHIEF COMPLAINT: S/P Total Thyroidectomy, Radical Neck Dissection   History of Present Illness:  75 year old female with metastatic thyroid cancer presents from Total Thyroidectomy with Radial neck Dissection. Post-operatively remained intubation and transferred to ICU for airway protection.    Pertinent  Medical History  HTN, GERD, Thyroid Cancer with scattered adenopathy in the right neck, COPD on 2L [place] followed by Dr. [name]  [facility] Events: Including procedures, antibiotic start and stop dates in addition to other pertinent events   . Total Thyroidecomy, Radical Neck Dissection > mass around carotid artery, some residual patch of tumor left   Interim History / Subjective:  Extubated to 4 L [place] 4/19 Sats are 100% Pt. Is alert and oriented x 3, without complaints Suture line intact without redness or drainage JP with serosang drainage  Objective   Blood pressure (!) 108/50, pulse 73, temperature (!) 97.4 F (36.3 C), temperature source Axillary, resp. rate 14, height 5\' 5"  (1.651 m), weight 46.7 kg, SpO2 100 %.    Vent Mode: PRVC FiO2 (%):  [30 %] 30 % Set Rate:  [18 bmp] 18 bmp Vt Set:  [450 mL] 450 mL PEEP:  [5 cmH20] 5 cmH20 Plateau Pressure:  [16 cmH20-19 cmH20] 16 cmH20   Intake/Output Summary (Last 24 hours) at 09/14/2020 0959 Last data filed at 09/14/2020 0930 Gross per 24 hour  Intake 3013.87 ml  Output 1660 ml  Net 1353.87 ml   Filed Weights   09/13/20 0807  Weight: 46.7 kg    Examination: General: Thin elderly female, awake and alert, in NAD HENT: Surgical incision to right lateral neck, JP drain in place with serosanguinous drainage, no redness noted Lungs: Bilateral chest excursion, coarse breath sounds,diminished per bases Cardiovascular: NSR per tele, HR 78, S1, S2, , no MRG Abdomen: soft,  non-tender, non-distended, BS +Body mass index is 17.15 kg/m. Extremities: No obvious deformities, No edema Neuro: Awake and alert, following commands, MAE x 4, A&O x 3 , appropriate GU: intact   Labs/imaging that I havepersonally reviewed  (right click and "Reselect all SmartList Selections" daily)    Resolved Hospital Problem list   K 4.1, Na 139 Glucose 164 Creatinine 1.05, BUN 11, Phos 3.4, Mag 1.8 WBC 9.8 HGB 9.4 Platelets 357 T Max 98.7  Assessment & Plan:   Respiratory Insufficieny in post-operative setting, remaining intubated after radical neck dissection involving carotid artery Plan - Extubated 4/19 to 4 L with sats of 100% - Aggressive Pulmonary Toilet - OOB to chair - IS Q 1 while awake - Minimize sedation Hurthle Cell Malignancy involving right thyroid lobe with paralyzed right vocal cord  Plan -To Follow ENT/Oncology outpatient - Swallow eval prior to PO intake  H/O COPD with Chronic Hypoxic Respiratory Failure on 2L  at baseline  Former smoker quit 2000 with a 25 pack year smoking history Plan -PRN Albuterol    GERD Plan  -PPI  HTN Plan - Continue to Hold home Norvasc >> BP is soft   Of note, patient does  Not use Trelegy due to powder formulation making her cough. She needs to be offered Breztri at next pulmonary OV I have messaged the office to set her up in 1 month for follow up with pulmonary office   As patient is extubated and doing well, protecting her airway PCCM will sign off. Please  do not hesitate to re-consult if we can be of further assistance    Best practice (right click and "Reselect all SmartList Selections" daily)  Diet:  Per Primary Team>> swallow eval Pain/Anxiety/Delirium protocol (if indicated): Yes (RASS goal -1,-2) VAP protocol (if indicated): Yes DVT prophylaxis: LMWH and SCD GI prophylaxis: PPI Glucose control:  SSI No Central venous access:  N/A Arterial line:  N/A Foley:  Will DC 4/19 Mobility:  bed rest   PT consulted: N/A Last date of multidisciplinary goals of care discussion: N/A Code Status:  full code Disposition: Transfer per ENT discression  Labs   CBC: Recent Labs  Lab 09/09/20 1026 09/13/20 1840 09/14/20 0344 09/14/20 0517  WBC 8.1  --  QUESTIONABLE RESULTS, RECOMMEND RECOLLECT TO VERIFY 9.8  HGB 10.6* 9.9* QUESTIONABLE RESULTS, RECOMMEND RECOLLECT TO VERIFY 9.4*  HCT 36.1 29.0* QUESTIONABLE RESULTS, RECOMMEND RECOLLECT TO VERIFY 30.6*  MCV 84.7  --  QUESTIONABLE RESULTS, RECOMMEND RECOLLECT TO VERIFY 82.0  PLT 333  --  QUESTIONABLE RESULTS, RECOMMEND RECOLLECT TO VERIFY 983    Basic Metabolic Panel: Recent Labs  Lab 09/09/20 1026 09/13/20 1840 09/13/20 1918 09/14/20 0517  NA 139 140  --  139  K 4.0 4.0  --  4.1  CL 99  --   --  103  CO2 32  --   --  25  GLUCOSE 131*  --   --  164*  BUN 14  --   --  11  CREATININE 1.00  --   --  1.05*  CALCIUM 9.1  --  8.2* 8.4*  MG  --   --   --  1.8  PHOS  --   --   --  3.4   GFR: Estimated Creatinine Clearance: 34.7 mL/min (A) (by C-G formula based on SCr of 1.05 mg/dL (H)). Recent Labs  Lab 09/09/20 1026 09/14/20 0344 09/14/20 0517  WBC 8.1 QUESTIONABLE RESULTS, RECOMMEND RECOLLECT TO VERIFY 9.8    Liver Function Tests: Recent Labs  Lab 09/14/20 0517  AST 22  ALT 11  ALKPHOS 54  BILITOT 0.7  PROT 5.8*  ALBUMIN 2.7*   No results for input(s): LIPASE, AMYLASE in the last 168 hours. No results for input(s): AMMONIA in the last 168 hours.  ABG    Component Value Date/Time   PHART 7.458 (H) 09/13/2020 1840   PCO2ART 44.3 09/13/2020 1840   PO2ART 103 09/13/2020 1840   HCO3 31.3 (H) 09/13/2020 1840   TCO2 33 (H) 09/13/2020 1840   ACIDBASEDEF 1.1 02/06/2009 1925   O2SAT 98.0 09/13/2020 1840     Coagulation Profile: No results for input(s): INR, PROTIME in the last 168 hours.  Cardiac Enzymes: No results for input(s): CKTOTAL, CKMB, CKMBINDEX, TROPONINI in the last 168 hours.  HbA1C: Hgb A1c MFr  Bld  Date/Time Value Ref Range Status  06/03/2020 10:19 AM 6.0 (H) 4.8 - 5.6 % Final    Comment:             Prediabetes: 5.7 - 6.4          Diabetes: >6.4          Glycemic control for adults with diabetes: <7.0   11/07/2018 09:20 AM 5.6 4.8 - 5.6 % Final    Comment:    (NOTE) Pre diabetes:          5.7%-6.4% Diabetes:              >6.4% Glycemic control for   <7.0% adults with diabetes  CBG: Recent Labs  Lab 09/13/20 1928 09/13/20 2318 09/14/20 0317 09/14/20 0724  GLUCAP 220* 248* 176* 154*   Allergies Allergies  Allergen Reactions  . Penicillins Itching     Tolerated Ancef Intraop 09/13/20  . Vicodin [Hydrocodone-Acetaminophen] Nausea And Vomiting      Critical care time:32 minutes    CRITICAL CARE Performed by: Magdalen Spatz   Total critical care time: 32 minutes  Critical care time was exclusive of separately billable procedures and treating other patients.  Critical care was necessary to treat or prevent imminent or life-threatening deterioration.  Critical care was time spent personally by me on the following activities: development of treatment plan with patient and/or surrogate as well as nursing, discussions with consultants, evaluation of patient's response to treatment, examination of patient, obtaining history from patient or surrogate, ordering and performing treatments and interventions, ordering and review of laboratory studies, ordering and review of radiographic studies, pulse oximetry and re-evaluation of patient's condition.  Magdalen Spatz, MSN, AGACNP-BC [place] for personal pager PCCM on call pager [phone] 09/14/2020 10:23 AM

## 2020-09-14 NOTE — Progress Notes (Signed)
[place] Progress Note Patient Name: Marilyn Solomon DOB: [birth year] MRN: [id number]   Date of Service  09/14/2020  HPI/Events of Note  Notified of H/H 6/22 from 9/29 No active bleed  EBL from surgery 100 ml  eICU Interventions  Re checking, phlebotomist in the room Will include type and screen in case transfusion necessary     Intervention Category Intermediate Interventions: Bleeding - evaluation and treatment with blood products  Judd Lien 09/14/2020, 5:21 AM

## 2020-09-14 NOTE — Procedures (Signed)
Extubation Procedure Note  Patient Details:   Name: ANWITHA MAPES DOB: 11-19-1945 MRN: [id number]   Airway Documentation:    Vent end date: 09/14/20 Vent end time: 0830   Evaluation  O2 sats: stable throughout Complications: No apparent complications Patient did tolerate procedure well. Bilateral Breath Sounds: Diminished   Patient extubated with MD at bedside & placed on 4L [place]. Patient tolerated very well. Able to cough & speak post extubation. Patient had positive cuff leak prior.  Kathie Dike 09/14/2020, 8:34 AM

## 2020-09-14 NOTE — Plan of Care (Signed)
Pt resting well, alert to verbal stimuli, tolerating ventilator. Foley catheter to be removed in am.

## 2020-09-15 ENCOUNTER — Ambulatory Visit: Payer: Medicare Other | Admitting: Internal Medicine

## 2020-09-15 ENCOUNTER — Encounter (HOSPITAL_COMMUNITY): Payer: Self-pay | Admitting: Otolaryngology

## 2020-09-15 LAB — MAGNESIUM: Magnesium: 2.2 mg/dL (ref 1.7–2.4)

## 2020-09-15 LAB — BASIC METABOLIC PANEL
Anion gap: 6 (ref 5–15)
BUN: 12 mg/dL (ref 8–23)
CO2: 31 mmol/L (ref 22–32)
Calcium: 8 mg/dL — ABNORMAL LOW (ref 8.9–10.3)
Chloride: 105 mmol/L (ref 98–111)
Creatinine, Ser: 0.96 mg/dL (ref 0.44–1.00)
GFR, Estimated: >60 mL/min (ref >60)
Glucose, Bld: 93 mg/dL (ref 70–99)
Potassium: 4.1 mmol/L (ref 3.5–5.1)
Sodium: 142 mmol/L (ref 135–145)

## 2020-09-15 LAB — GLUCOSE, CAPILLARY
Glucose-Capillary: 95 mg/dL (ref 70–99)
Glucose-Capillary: 88 mg/dL (ref 70–99)
Glucose-Capillary: 99 mg/dL (ref 70–99)
Glucose-Capillary: 130 mg/dL — ABNORMAL HIGH (ref 70–99)
Glucose-Capillary: 141 mg/dL — ABNORMAL HIGH (ref 70–99)

## 2020-09-15 LAB — CBC
HCT: 29.1 % — ABNORMAL LOW (ref 36.0–46.0)
Hemoglobin: 8.4 g/dL — ABNORMAL LOW (ref 12.0–15.0)
MCH: 24.6 pg — ABNORMAL LOW (ref 26.0–34.0)
MCHC: 28.9 g/dL — ABNORMAL LOW (ref 30.0–36.0)
MCV: 85.3 fL (ref 80.0–100.0)
Platelets: 235 10*3/uL (ref 150–400)
RBC: 3.41 MIL/uL — ABNORMAL LOW (ref 3.87–5.11)
RDW: 13.5 % (ref 11.5–15.5)
WBC: 7.5 10*3/uL (ref 4.0–10.5)
nRBC: 0 % (ref 0.0–0.2)

## 2020-09-15 LAB — CALCIUM
Calcium: 8.1 mg/dL — ABNORMAL LOW (ref 8.9–10.3)
Calcium: 8.3 mg/dL — ABNORMAL LOW (ref 8.9–10.3)

## 2020-09-15 MED ORDER — IPRATROPIUM-ALBUTEROL 0.5-2.5 (3) MG/3ML IN SOLN
3.0000 mL | RESPIRATORY_TRACT | Status: DC | PRN
  Administered 2020-09-15: 3 mL via RESPIRATORY_TRACT

## 2020-09-15 MED ORDER — ORAL CARE MOUTH RINSE
15.0000 mL | Freq: Two times a day (BID) | OROMUCOSAL | Status: DC
  Administered 2020-09-15 – 2020-09-17 (×5): 15 mL via OROMUCOSAL

## 2020-09-15 MED ORDER — AMLODIPINE BESYLATE 5 MG PO TABS
5.0000 mg | ORAL_TABLET | Freq: Every day | ORAL | Status: DC
  Administered 2020-09-15 – 2020-09-17 (×3): 5 mg via ORAL
  Filled 2020-09-15 (×3): qty 1

## 2020-09-15 MED ORDER — OXYCODONE HCL 5 MG PO TABS: 10.0000 mg | ORAL_TABLET | Freq: Three times a day (TID) | ORAL | Status: DC | PRN

## 2020-09-15 MED ORDER — GUAIFENESIN ER 600 MG PO TB12
600.0000 mg | ORAL_TABLET | Freq: Two times a day (BID) | ORAL | Status: DC
  Administered 2020-09-15: 600 mg via ORAL
  Filled 2020-09-15: qty 1

## 2020-09-15 MED ORDER — CALCIUM CARBONATE ANTACID 500 MG PO CHEW
2.0000 | CHEWABLE_TABLET | Freq: Two times a day (BID) | ORAL | Status: DC
  Administered 2020-09-15 – 2020-09-17 (×5): 400 mg via ORAL
  Filled 2020-09-15 (×5): qty 2

## 2020-09-15 MED ORDER — IPRATROPIUM-ALBUTEROL 0.5-2.5 (3) MG/3ML IN SOLN
RESPIRATORY_TRACT | Status: AC
  Filled 2020-09-15: qty 3

## 2020-09-15 MED ORDER — GUAIFENESIN 100 MG/5ML PO SOLN
15.0000 mL | Freq: Four times a day (QID) | ORAL | Status: DC
  Administered 2020-09-15: 300 mg via ORAL
  Filled 2020-09-15 (×4): qty 15

## 2020-09-15 MED ORDER — LEVOTHYROXINE SODIUM 75 MCG PO TABS
75.0000 ug | ORAL_TABLET | Freq: Every day | ORAL | Status: DC
  Administered 2020-09-15 – 2020-09-17 (×3): 75 ug via ORAL
  Filled 2020-09-15 (×3): qty 1

## 2020-09-15 MED ORDER — DEXAMETHASONE SODIUM PHOSPHATE 10 MG/ML IJ SOLN
10.0000 mg | Freq: Once | INTRAMUSCULAR | Status: AC
  Administered 2020-09-15: 10 mg via INTRAVENOUS
  Filled 2020-09-15: qty 1

## 2020-09-15 MED ORDER — GUAIFENESIN 100 MG/5ML PO SOLN
15.0000 mL | Freq: Four times a day (QID) | ORAL | Status: DC
  Administered 2020-09-15 – 2020-09-17 (×7): 300 mg via ORAL
  Filled 2020-09-15 (×10): qty 15

## 2020-09-15 NOTE — Progress Notes (Signed)
Subjective: Some difficulty swallowing but able to take a diet.  Complains of congestion in her throat that she is having a hard time clearing.  Makes it feel hard to breathe.  Objective: Vital signs in last 24 hours: Temp:  [98.2 F (36.8 C)-98.7 F (37.1 C)] 98.2 F (36.8 C) (04/20 0400) Pulse Rate:  [70-115] 80 (04/20 0500) Resp:  [14-28] 16 (04/20 0500) BP: (86-147)/(47-81) 124/56 (04/20 0500) SpO2:  [93 %-100 %] 99 % (04/20 0500) Wt Readings from Last 1 Encounters:  09/13/20 46.7 kg    Intake/Output from previous day: 04/19 0701 - 04/20 0700 In: 1453 [P.O.:1; I.V.:1352; IV Piggyback:100] Out: 50 [Drains:50] Intake/Output this shift: No intake/output data recorded.  General appearance: alert, cooperative and no distress Neck: incision clean and intact with no fluid collection, drain functioning, good shoulder shrug, strong voice, no stridor  Recent Labs    09/14/20 0517 09/15/20 0549  WBC 9.8 7.5  HGB 9.4* 8.4*  HCT 30.6* 29.1*  PLT 357 235    Recent Labs    09/14/20 0517 09/14/20 1257 09/14/20 2016 09/15/20 0549  NA 139  --   --  142  K 4.1  --   --  4.1  CL 103  --   --  105  CO2 25  --   --  31  GLUCOSE 164*  --   --  93  BUN 11  --   --  12  CREATININE 1.05*  --   --  0.96  CALCIUM 8.4*   < > 7.8* 8.0*   < > = values in this interval not displayed.    Medications: I have reviewed the patient's current medications.  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  Will come back and look at vocal folds with flexible laryngoscope.  Will restart home medicines and add Mucinex.  Will start thyroid replacement and supplement calcium.   LOS: 2 days   Melida Quitter 09/15/2020, 8:17 AM

## 2020-09-15 NOTE — Procedures (Signed)
Preop diagnosis: Right vocal fold paralysis Postop diagnosis: same Procedure: Transnasal fiberoptic laryngoscopy Surgeon: Redmond Baseman Anesth: Topical with 4% lidocaine Compl: None Findings: Right vocal fold remains immobile and foreshortened.  The left vocal fold is normally mobile.  There is some subglottic edema. Description:  After discussing risks, benefits, and alternatives, the patient was placed in a seated position and the right nasal passage was sprayed with topical anesthetic.  The fiberoptic scope was passed through the right nasal passage to view the pharynx and larynx.  Findings are noted above.  The scope was then removed and he was returned to nursing care in stable condition.

## 2020-09-16 LAB — GLUCOSE, CAPILLARY
Glucose-Capillary: 103 mg/dL — ABNORMAL HIGH (ref 70–99)
Glucose-Capillary: 111 mg/dL — ABNORMAL HIGH (ref 70–99)
Glucose-Capillary: 116 mg/dL — ABNORMAL HIGH (ref 70–99)
Glucose-Capillary: 121 mg/dL — ABNORMAL HIGH (ref 70–99)
Glucose-Capillary: 146 mg/dL — ABNORMAL HIGH (ref 70–99)

## 2020-09-16 LAB — SURGICAL PATHOLOGY

## 2020-09-16 LAB — CALCIUM
Calcium: 8.5 mg/dL — ABNORMAL LOW (ref 8.9–10.3)
Calcium: 8.5 mg/dL — ABNORMAL LOW (ref 8.9–10.3)
Calcium: 8.3 mg/dL — ABNORMAL LOW (ref 8.9–10.3)

## 2020-09-16 MED ORDER — KCL IN DEXTROSE-NACL 20-5-0.45 MEQ/L-%-% IV SOLN
INTRAVENOUS | Status: DC
  Filled 2020-09-16 (×2): qty 1000

## 2020-09-16 MED ORDER — PANTOPRAZOLE SODIUM 40 MG PO PACK
40.0000 mg | PACK | Freq: Every day | ORAL | Status: DC
  Filled 2020-09-16: qty 20

## 2020-09-16 NOTE — Care Management Important Message (Signed)
Important Message  Patient Details  Name: Marilyn Solomon MRN: [id number] Date of Birth: 1945/08/15   Medicare Important Message Given:  Yes     [name] 09/16/2020, 12:05 PM

## 2020-09-16 NOTE — Progress Notes (Signed)
Subjective: Congested feeling in her throat has improved, perhaps due to steroid dosing.  Still having difficulty swallowing, not drinking enough fluids.  Ambulating in room.  Objective: Vital signs in last 24 hours: Temp:  [98 F (36.7 C)-98.5 F (36.9 C)] 98 F (36.7 C) (04/21 0709) Pulse Rate:  [86-100] 87 (04/21 0709) Resp:  [17-21] 17 (04/21 0709) BP: (112-152)/(63-82) 120/72 (04/21 0709) SpO2:  [95 %-100 %] 99 % (04/21 0709) Wt Readings from Last 1 Encounters:  09/13/20 46.7 kg    Intake/Output from previous day: 04/20 0701 - 04/21 0700 In: 240 [P.O.:240] Out: -  Intake/Output this shift: No intake/output data recorded.  General appearance: alert, cooperative and no distress Neck: good voice, neck incision clean and intact, no fluid collection, drain functioning  Recent Labs    09/14/20 0517 09/15/20 0549  WBC 9.8 7.5  HGB 9.4* 8.4*  HCT 30.6* 29.1*  PLT 357 235    Recent Labs    09/14/20 0517 09/14/20 1257 09/15/20 0549 09/15/20 1141 09/15/20 2206 09/16/20 0435  NA 139  --  142  --   --   --   K 4.1  --  4.1  --   --   --   CL 103  --  105  --   --   --   CO2 25  --  31  --   --   --   GLUCOSE 164*  --  93  --   --   --   BUN 11  --  12  --   --   --   CREATININE 1.05*  --  0.96  --   --   --   CALCIUM 8.4*   < > 8.0*   < > 8.3* 8.5*   < > = values in this interval not displayed.    Medications: I have reviewed the patient's current medications.  Assessment/Plan: Hurthle cell thyroid cancer  Will resume some IV fluid.  Calcium trending back up, started supplement yesterday.  Ambulate in halls.  Need drain output recorded.  Plan thyroid uptake scan to assess bulk of remaining tumor.  Advance diet as able.   LOS: 3 days   Melida Quitter 09/16/2020, 8:15 AM

## 2020-09-16 NOTE — Care Management Important Message (Signed)
Important Message  Patient Details  Name: Marilyn Solomon MRN: [id number] Date of Birth: Jan 16, 1946   Medicare Important Message Given:  No     Tulani Kidney 09/16/2020, 3:22 PM

## 2020-09-17 ENCOUNTER — Other Ambulatory Visit: Payer: Self-pay | Admitting: Internal Medicine

## 2020-09-17 DIAGNOSIS — G8929 Other chronic pain: Secondary | ICD-10-CM

## 2020-09-17 LAB — GLUCOSE, CAPILLARY
Glucose-Capillary: 104 mg/dL — ABNORMAL HIGH (ref 70–99)
Glucose-Capillary: 109 mg/dL — ABNORMAL HIGH (ref 70–99)
Glucose-Capillary: 114 mg/dL — ABNORMAL HIGH (ref 70–99)
Glucose-Capillary: 82 mg/dL (ref 70–99)
Glucose-Capillary: 97 mg/dL (ref 70–99)

## 2020-09-17 LAB — CALCIUM
Calcium: 8.5 mg/dL — ABNORMAL LOW (ref 8.9–10.3)
Calcium: 8.5 mg/dL — ABNORMAL LOW (ref 8.9–10.3)

## 2020-09-17 MED ORDER — PANTOPRAZOLE SODIUM 40 MG PO TBEC: 40.0000 mg | DELAYED_RELEASE_TABLET | Freq: Every day | ORAL | Status: DC

## 2020-09-17 MED ORDER — PANTOPRAZOLE SODIUM 40 MG PO TBEC
40.0000 mg | DELAYED_RELEASE_TABLET | Freq: Every day | ORAL | Status: DC
  Administered 2020-09-17: 40 mg via ORAL
  Filled 2020-09-17: qty 1

## 2020-09-17 NOTE — Discharge Summary (Signed)
Physician Discharge Summary  Patient ID: Marilyn Solomon MRN: [id number] DOB/AGE: 74-Apr-1947 75 y.o.  Admit date: 09/13/2020 Discharge date: 09/17/2020  Admission Diagnoses: Hurthle cell thyroid cancer  Discharge Diagnoses:  Active Problems:   Neck mass   Thyroid cancer [facility])   Discharged Condition: good  Hospital Course: 75 year old female presented for surgical management of right thyroid Hurthle cell carcinoma.  See operative note.  She was left intubated the first night after surgery in the ICU setting.  She had a drain in place.  She was successfully extubated the next day.  She progressed gradually from there.  Her calcium level sagged over the first couple of days but stabilized well with additional of oral calcium supplementation.  Her swallow was difficult for the first few days but has improved at the time of discharge.  Her drain output has decreased so the drain was removed.  She is felt stable for discharge.  Consults: None  Significant Diagnostic Studies: None  Treatments: surgery: Total thyroidectomy with right radical neck dissection  Discharge Exam: Blood pressure (!) 155/77, pulse 79, temperature 97.7 F (36.5 C), temperature source Oral, resp. rate 16, height 5\' 5"  (1.651 m), weight 46.7 kg, SpO2 100 %. General appearance: alert, cooperative and no distress Neck: strong voice, neck incision clean and intact, no fluid collection  Disposition: Discharge disposition: 01-Home or Self Care       Discharge Instructions    Diet - low sodium heart healthy   Complete by: As directed    Discharge instructions   Complete by: As directed    Take two Tums with calcium in the morning and two in the evening.  OK to allow water to run over the incision in the shower, gently pat dry.  Apply antibiotic ointment twice daily.  Nuclear medicine should contact you about your scan.   Increase activity slowly   Complete by: As directed    No wound care   Complete by: As  directed      Allergies as of 09/17/2020      Reactions   Penicillins Itching   Tolerated Ancef Intraop 09/13/20   Vicodin [hydrocodone-acetaminophen] Nausea And Vomiting      Medication List    TAKE these medications   albuterol 108 (90 Base) MCG/ACT inhaler Commonly known as: VENTOLIN HFA Inhale 1-2 puffs into the lungs every 6 (six) hours as needed for wheezing or shortness of breath.   ALPRAZolam 1 MG tablet Commonly known as: XANAX Take 1 tablet (1 mg total) by mouth every 8 (eight) hours as needed for anxiety.   amLODipine 5 MG tablet Commonly known as: NORVASC Take 1 tablet (5 mg total) by mouth daily. What changed: when to take this   ibuprofen 200 MG tablet Commonly known as: ADVIL Take 400 mg by mouth every 8 (eight) hours as needed (pain).   ipratropium-albuterol 0.5-2.5 (3) MG/3ML Soln Commonly known as: DUONEB Take 3 mLs by nebulization every 6 (six) hours as needed (shortness of breath or wheezing).   levothyroxine 75 MCG tablet Commonly known as: SYNTHROID Take 1 tablet (75 mcg total) by mouth daily.   oxyCODONE 15 MG immediate release tablet Commonly known as: ROXICODONE TAKE (1) TABLET BY MOUTH EVERY 8 HOURS AS NEEDED. What changed: See the new instructions.   pantoprazole 40 MG tablet Commonly known as: PROTONIX TAKE 1 TABLET BY MOUTH DAILY.   Trelegy Ellipta 100-62.5-25 MCG/INH Aepb Generic drug: Fluticasone-Umeclidin-Vilant Inhale 1 puff into the lungs daily as needed (respiratory  issues).   Vitamin D (Ergocalciferol) 1.25 MG (50000 UNIT) Caps capsule Commonly known as: DRISDOL Take 50,000 Units by mouth every Sunday.       Follow-up Information    Melida Quitter, MD Follow up on 09/23/2020.   Specialty: Otolaryngology Contact information: [address] [place] [address] [phone]               Signed: Melida Quitter 09/17/2020, 12:27 PM

## 2020-09-17 NOTE — Consult Note (Signed)
   [facility] Inpatient Consult   09/17/2020  KIM OKI [birth year] [id number]  [facility] [[facility]] Patient: [employer]  Patient was screened for [place] Management services. Patient will have the transition of care call conducted by the primary care provider. This patient  has a chronic disease management Embedded Care Management team that she can have access to.  Plan: Patient's primary care provider is listed to do the Transition of Care follow up and call.  Please contact for further questions,  Natividad Brood, RN BSN [facility] Liaison  [phone] business mobile phone Toll free office [phone]  Fax number: [phone] [email or web address]

## 2020-09-23 ENCOUNTER — Telehealth: Payer: Self-pay

## 2020-09-23 ENCOUNTER — Other Ambulatory Visit: Payer: Self-pay | Admitting: Internal Medicine

## 2020-09-23 ENCOUNTER — Other Ambulatory Visit: Payer: Self-pay | Admitting: *Deleted

## 2020-09-23 DIAGNOSIS — E039 Hypothyroidism, unspecified: Secondary | ICD-10-CM

## 2020-09-23 DIAGNOSIS — I10 Essential (primary) hypertension: Secondary | ICD-10-CM

## 2020-09-23 MED ORDER — LEVOTHYROXINE SODIUM 75 MCG PO TABS: 75.0000 ug | ORAL_TABLET | Freq: Every day | ORAL | 0 refills | Status: DC

## 2020-09-23 NOTE — Telephone Encounter (Signed)
Patient called need med refill on levothyroxine (SYNTHROID) 75 MCG tablet send in to [facility]

## 2020-09-23 NOTE — Telephone Encounter (Signed)
Pt medication sent to [facility]

## 2020-09-25 DIAGNOSIS — J441 Chronic obstructive pulmonary disease with (acute) exacerbation: Secondary | ICD-10-CM | POA: Diagnosis not present

## 2020-09-28 DIAGNOSIS — C73 Malignant neoplasm of thyroid gland: Secondary | ICD-10-CM | POA: Diagnosis not present

## 2020-10-06 ENCOUNTER — Other Ambulatory Visit: Payer: Self-pay | Admitting: Internal Medicine

## 2020-10-06 ENCOUNTER — Telehealth: Payer: Self-pay

## 2020-10-06 DIAGNOSIS — F419 Anxiety disorder, unspecified: Secondary | ICD-10-CM

## 2020-10-06 MED ORDER — ALPRAZOLAM 1 MG PO TABS: 1.0000 mg | ORAL_TABLET | Freq: Three times a day (TID) | ORAL | 2 refills | Status: DC | PRN

## 2020-10-06 NOTE — Telephone Encounter (Signed)
Patient called need med refill ALPRAZolam Duanne Moron) 1 MG tablet  Pharmacy: [facility]

## 2020-10-06 NOTE — Telephone Encounter (Signed)
Do you want to refill this? Please advise.

## 2020-10-07 ENCOUNTER — Other Ambulatory Visit (HOSPITAL_COMMUNITY): Payer: Self-pay | Admitting: Otolaryngology

## 2020-10-07 DIAGNOSIS — C73 Malignant neoplasm of thyroid gland: Secondary | ICD-10-CM

## 2020-10-14 ENCOUNTER — Other Ambulatory Visit: Payer: Self-pay | Admitting: Internal Medicine

## 2020-10-14 DIAGNOSIS — G8929 Other chronic pain: Secondary | ICD-10-CM

## 2020-10-18 ENCOUNTER — Encounter (HOSPITAL_COMMUNITY)
Admission: RE | Admit: 2020-10-18 | Discharge: 2020-10-18 | Disposition: A | Discharge status: Home / Self Care | Payer: Medicare Other | Source: Ambulatory Visit | Attending: Otolaryngology | Admitting: Otolaryngology

## 2020-10-18 ENCOUNTER — Other Ambulatory Visit: Payer: Self-pay

## 2020-10-18 ENCOUNTER — Inpatient Hospital Stay: Payer: Medicare Other | Admitting: Pulmonary Disease

## 2020-10-18 DIAGNOSIS — C73 Malignant neoplasm of thyroid gland: Secondary | ICD-10-CM | POA: Diagnosis not present

## 2020-10-18 MED ORDER — THYROTROPIN ALFA 0.9 MG IM SOLR
0.9000 mg | INTRAMUSCULAR | Status: AC
  Administered 2020-10-18: 0.9 mg via INTRAMUSCULAR

## 2020-10-18 MED ORDER — THYROTROPIN ALFA 0.9 MG IM SOLR
INTRAMUSCULAR | Status: AC
  Filled 2020-10-18: qty 0.9

## 2020-10-19 ENCOUNTER — Encounter (INDEPENDENT_AMBULATORY_CARE_PROVIDER_SITE_OTHER): Payer: Self-pay

## 2020-10-19 ENCOUNTER — Encounter (HOSPITAL_COMMUNITY)
Admission: RE | Admit: 2020-10-19 | Discharge: 2020-10-19 | Disposition: A | Discharge status: Home / Self Care | Payer: Medicare Other | Source: Ambulatory Visit | Attending: Otolaryngology | Admitting: Otolaryngology

## 2020-10-19 DIAGNOSIS — C73 Malignant neoplasm of thyroid gland: Secondary | ICD-10-CM | POA: Diagnosis not present

## 2020-10-19 MED ORDER — THYROTROPIN ALFA 0.9 MG IM SOLR
INTRAMUSCULAR | Status: AC
  Filled 2020-10-19: qty 0.9

## 2020-10-19 MED ORDER — THYROTROPIN ALFA 0.9 MG IM SOLR
0.9000 mg | INTRAMUSCULAR | Status: AC
  Administered 2020-10-19: 0.9 mg via INTRAMUSCULAR

## 2020-10-20 ENCOUNTER — Other Ambulatory Visit: Payer: Self-pay

## 2020-10-20 ENCOUNTER — Encounter (HOSPITAL_COMMUNITY)
Admission: RE | Admit: 2020-10-20 | Discharge: 2020-10-20 | Disposition: A | Discharge status: Home / Self Care | Payer: Medicare Other | Source: Ambulatory Visit | Attending: Otolaryngology | Admitting: Otolaryngology

## 2020-10-20 DIAGNOSIS — C73 Malignant neoplasm of thyroid gland: Secondary | ICD-10-CM | POA: Diagnosis not present

## 2020-10-20 MED ORDER — SODIUM IODIDE I 131 CAPSULE
103.0000 | Freq: Once | INTRAVENOUS | Status: AC
  Administered 2020-10-20: 103 via ORAL

## 2020-10-25 DIAGNOSIS — J441 Chronic obstructive pulmonary disease with (acute) exacerbation: Secondary | ICD-10-CM | POA: Diagnosis not present

## 2020-10-29 ENCOUNTER — Ambulatory Visit (HOSPITAL_COMMUNITY)
Admission: RE | Admit: 2020-10-29 | Discharge: 2020-10-29 | Disposition: A | Discharge status: Home / Self Care | Payer: Medicare Other | Source: Ambulatory Visit | Attending: Otolaryngology | Admitting: Otolaryngology

## 2020-10-29 ENCOUNTER — Other Ambulatory Visit: Payer: Self-pay

## 2020-10-29 DIAGNOSIS — Z9889 Other specified postprocedural states: Secondary | ICD-10-CM | POA: Diagnosis not present

## 2020-10-29 DIAGNOSIS — C73 Malignant neoplasm of thyroid gland: Secondary | ICD-10-CM | POA: Diagnosis not present

## 2020-10-29 DIAGNOSIS — E0789 Other specified disorders of thyroid: Secondary | ICD-10-CM | POA: Diagnosis not present

## 2020-11-02 ENCOUNTER — Telehealth: Payer: Self-pay | Admitting: "Endocrinology

## 2020-11-02 NOTE — Telephone Encounter (Signed)
Tried to reach patient, no answer, left voicemail to call back.

## 2020-11-02 NOTE — Telephone Encounter (Signed)
-----   Message from Cassandria Anger, MD sent at 11/01/2020  4:29 PM EDT ----- Lovena Le, This patient needs to return for a  follow up visit in days time.

## 2020-11-10 ENCOUNTER — Other Ambulatory Visit: Payer: Self-pay | Admitting: "Endocrinology

## 2020-11-11 ENCOUNTER — Ambulatory Visit: Payer: Medicare Other | Admitting: "Endocrinology

## 2020-11-11 ENCOUNTER — Other Ambulatory Visit: Payer: Self-pay

## 2020-11-11 ENCOUNTER — Encounter: Payer: Self-pay | Admitting: "Endocrinology

## 2020-11-11 VITALS — BP 106/65 | HR 101 | Ht 65.0 in | Wt 100.6 lb

## 2020-11-11 DIAGNOSIS — E890 Postprocedural hypothyroidism: Secondary | ICD-10-CM | POA: Diagnosis not present

## 2020-11-11 DIAGNOSIS — C73 Malignant neoplasm of thyroid gland: Secondary | ICD-10-CM

## 2020-11-11 NOTE — Progress Notes (Signed)
11/11/2020, 9:34 AM   Endocrinology follow-up note  Subjective:    Patient ID: Marilyn Solomon, female    DOB: May 07, 1946, PCP Lindell Spar, MD   Past Medical History:  Diagnosis Date   Anemia    Anxiety    Arthritis    C. difficile diarrhea 10/08/2019   Chronic back pain    COPD (chronic obstructive pulmonary disease) (HCC)    GERD (gastroesophageal reflux disease)    HCAP (healthcare-associated pneumonia) 06/28/2017   Hypertension    Hypothyroidism    On home oxygen therapy    2L   Pneumonia due to COVID-19 virus 09/28/2019   Reflux    no medications currently, asymptomatic   Thyroid disease    Past Surgical History:  Procedure Laterality Date   ABDOMINAL HYSTERECTOMY     BIOPSY  10/08/2017   Procedure: BIOPSY;  Surgeon: Daneil Dolin, MD;  Location: [facility];  Service: Endoscopy;;  gastric   cataracts Bilateral    COLONOSCOPY  2007   Dr. Gala Romney: normal rectum, diverticula    COLONOSCOPY WITH PROPOFOL N/A 11/15/2015   Dr. Gala Romney: grade II hemorrhoids, diverticulosis   COLONOSCOPY WITH PROPOFOL N/A 10/08/2017   Rourk: Diverticulosis   ESOPHAGOGASTRODUODENOSCOPY (EGD) WITH PROPOFOL N/A 10/08/2017   Rourk: Nonobstructing Schatzki ring, mild erosive reflux esophagitis, small hiatal hernia, gastritis but no H. pylori.   FRACTURE SURGERY Right 2005   Wrist   JOINT REPLACEMENT Left 2000   hip   RADICAL NECK DISSECTION Right 09/13/2020   Procedure: RADICAL NECK DISSECTION;  Surgeon: Melida Quitter, MD;  Location: [place];  Service: ENT;  Laterality: Right;   THYROIDECTOMY N/A 09/13/2020   Procedure: TOTAL THYROIDECTOMY;  Surgeon: Melida Quitter, MD;  Location: [place];  Service: ENT;  Laterality: N/A;   TOTAL HIP ARTHROPLASTY Left    WRIST SURGERY Right    Social History   Socioeconomic History   Marital status: Widowed    Spouse name: Not on file   Number of children: Not on file   Years of education: Not on file    Highest education level: Not on file  Occupational History   Occupation: disability  Tobacco Use   Smoking status: Former    Packs/day: 25.00    Years: 1.00    Pack years: 25.00    Types: Cigarettes    Quit date: 08/27/1998    Years since quitting: 22.2   Smokeless tobacco: Never   Tobacco comments:    quit in 2000  Vaping Use   Vaping Use: Never used  Substance and Sexual Activity   Alcohol use: No    Alcohol/week: 0.0 standard drinks   Drug use: No   Sexual activity: Yes    Birth control/protection: Surgical  Other Topics Concern   Not on file  Social History Narrative   Not on file   Social Determinants of Health   Financial Resource Strain: Low Risk    Difficulty of Paying Living Expenses: Not hard at all  Food Insecurity: No Food Insecurity   Worried About Charity fundraiser in the Last Year: Never true   [place] in the Last Year: Never true  Transportation Needs: No Transportation Needs   Lack of Transportation (  Medical): No   Lack of Transportation (Non-Medical): No  Physical Activity: Insufficiently Active   Days of Exercise per Week: 7 days   Minutes of Exercise per Session: 10 min  Stress: No Stress Concern Present   Feeling of Stress : Not at all  Social Connections: Socially Isolated   Frequency of Communication with Friends and Family: More than three times a week   Frequency of Social Gatherings with Friends and Family: Once a week   Attends Religious Services: Never   Marine scientist or Organizations: No   Attends Archivist Meetings: Never   Marital Status: Widowed   Family History  Problem Relation Age of Onset   Bone cancer Sister    Pancreatic cancer Sister    Pneumonia Mother    Heart attack Father    Diabetes Sister    Dementia Sister    Colon cancer Neg Hx    Outpatient Encounter Medications as of 11/11/2020  Medication Sig   albuterol (VENTOLIN HFA) 108 (90 Base) MCG/ACT inhaler Inhale 1-2 puffs into the  lungs every 6 (six) hours as needed for wheezing or shortness of breath.   ALPRAZolam (XANAX) 1 MG tablet Take 1 tablet (1 mg total) by mouth every 8 (eight) hours as needed for anxiety.   amLODipine (NORVASC) 5 MG tablet TAKE 1 TABLET BY MOUTH DAILY.   Fluticasone-Umeclidin-Vilant (TRELEGY ELLIPTA) 100-62.5-25 MCG/INH AEPB Inhale 1 puff into the lungs daily as needed (respiratory issues).   ibuprofen (ADVIL) 200 MG tablet Take 400 mg by mouth every 8 (eight) hours as needed (pain).   ipratropium-albuterol (DUONEB) 0.5-2.5 (3) MG/3ML SOLN Take 3 mLs by nebulization every 6 (six) hours as needed (shortness of breath or wheezing).   levothyroxine (SYNTHROID) 75 MCG tablet Take 1 tablet (75 mcg total) by mouth daily.   oxyCODONE (ROXICODONE) 15 MG immediate release tablet TAKE (1) TABLET BY MOUTH EVERY 8 HOURS AS NEEDED.   pantoprazole (PROTONIX) 40 MG tablet TAKE 1 TABLET BY MOUTH DAILY. (Patient taking differently: Take 40 mg by mouth daily.)   Vitamin D, Ergocalciferol, (DRISDOL) 1.25 MG (50000 UNIT) CAPS capsule Take 50,000 Units by mouth every Sunday.   No facility-administered encounter medications on file as of 11/11/2020.   ALLERGIES: Allergies  Allergen Reactions   Penicillins Itching     Tolerated Ancef Intraop 09/13/20   Vicodin [Hydrocodone-Acetaminophen] Nausea And Vomiting    VACCINATION STATUS: Immunization History  Administered Date(s) Administered   Influenza Split 03/08/2016   Influenza-Unspecified 01/28/2020   Moderna Sars-Covid-2 Vaccination 10/30/2019, 11/27/2019   Pneumococcal Polysaccharide-23 06/29/2017    HPI Marilyn Solomon is 75 y.o. female who was seen prior to February 2022 in consultation for multinodular goiter and hypothyroidism.   She was originally referred by her PMD: Lindell Spar, MD.   Work-up of 2.3 cm right thyroid lobe nodule was malignant.  She was sent for surgical treatment in [facility] with Dr. Harlow Asa during her last visit  in February 2022.   She did not return for follow-up. Patient was related high risk with chronic oxygen therapy for pulmonary disease related to chronic heavy smoking in the past.  Dr. Harlow Asa sent her to otolaryngology at [facility] for better management.  Patient had scattered adenopathy on CT scan of head and neck which was suspected for metastatic disease.  Flexible laryngoscopy performed prior to her surgery showed paralyzed right vocal cord as a source of voice hoarseness concerning for malignancy involving the right recurrent laryngeal nerve.  Because of her risk profile, she was sent to Dr. Redmond Baseman who recommended total thyroidectomy right neck dissection and possible nerve monitoring. She underwent the surgery on September 13, 2020. Final microscopic diagnosis was papillary thyroid carcinoma, classical variant, with extensive dedifferentiation into diffuse sclerosing variant. Further description of the surgical sample showed widely positive margins for carcinoma, microscopic extrathyroidal extension, negative lymphovascular invasion, benign lymph node, background thyroid parenchyma with lymphocytic thyroiditis.  Subsequently, between May 25 and October 29, 2020 she underwent Thyrogen stimulated remnant ablation with whole-body scan. She received 103 mCi of I-131.  Whole-body scan posttherapy on October 29, 2020 showed left thyroid remnant.  Focal increased tracer uptake at the inferior right cervical region question metastatic focus too high cervical lymph node  or skull base.  Abnormal tracer uptake of the inferior right hemithorax concerning for metastasis. Noncontrast CT or PET/CT was recommended. She is currently on Synthroid 75 mcg p.o. daily with good consistency.  She does not have recent thyroid function test. She comes with 7 pound weight loss since her last visit.  She still has hoarse voice.  Remains on oxygen supplement and bronchodilators related to her COPD.    She is a patient with  advanced COPD on portable oxygen supplement for several years. She denies dysphagia.  She denies palpitations, tremors, heat/cold intolerance.  She was diagnosed with hypothyroidism few years ago was already taking levothyroxine 75 mcg if improved prior to her surgery.   -She denies family history of thyroid malignancy.  Review of Systems  Constitutional: + lost  weight , + fatigue, no subjective hyperthermia, no subjective hypothermia  Respiratory: + Uses portable oxygen canister, no cough, no shortness of breath   Objective:    Vitals with BMI 11/11/2020 09/17/2020 09/17/2020  Height 5\' 5"  - -  Weight 100 lbs 10 oz - -  BMI 33.54 - -  Systolic 562 563 893  Diastolic 65 63 77  Pulse 734 84 79     BP 106/65   Pulse (!) 101   Ht 5\' 5"  (1.651 m)   Wt 100 lb 9.6 oz (45.6 kg)   BMI 16.74 kg/m   Wt Readings from Last 3 Encounters:  11/11/20 100 lb 9.6 oz (45.6 kg)  09/13/20 103 lb 1 oz (46.7 kg)  09/09/20 103 lb 1 oz (46.7 kg)    Physical Exam  Constitutional:  Body mass index is 16.74 kg/m.,  not in acute distress, normal state of mind Eyes: PERRLA, EOMI, no exophthalmos ENT: Well-healed surgical scar on anterior lower neck.  CMP ( most recent) CMP     Component Value Date/Time   NA 142 09/15/2020 0549   NA 142 06/03/2020 1019   K 4.1 09/15/2020 0549   CL 105 09/15/2020 0549   CO2 31 09/15/2020 0549   GLUCOSE 93 09/15/2020 0549   BUN 12 09/15/2020 0549   BUN 22 06/03/2020 1019   CREATININE 0.96 09/15/2020 0549   CALCIUM 8.5 (L) 09/17/2020 1206   PROT 5.8 (L) 09/14/2020 0517   PROT 7.7 06/03/2020 1019   ALBUMIN 2.7 (L) 09/14/2020 0517   ALBUMIN 4.2 06/03/2020 1019   AST 22 09/14/2020 0517   ALT 11 09/14/2020 0517   ALKPHOS 54 09/14/2020 0517   BILITOT 0.7 09/14/2020 0517   BILITOT 0.2 06/03/2020 1019   GFRNONAA >60 09/15/2020 0549   GFRAA 74 06/03/2020 1019     Diabetic Labs (most recent): Lab Results  Component Value Date   HGBA1C 6.0 (H) 06/03/2020  HGBA1C 5.6 11/07/2018   HGBA1C 5.5 06/29/2017     Lipid Panel ( most recent) Lipid Panel     Component Value Date/Time   CHOL 190 06/03/2020 1019   TRIG 464 (H) 09/14/2020 0344   HDL 41 06/03/2020 1019   CHOLHDL 4.6 (H) 06/03/2020 1019   LDLCALC 121 (H) 06/03/2020 1019   LABVLDL 28 06/03/2020 1019        Thyroid ultrasound on June 07, 2020 - Right lobe measured 5.1 cm with 2.3 cm suspicious nodule, left lobe 2.6 cm with 1 cm nodule.      Fine-needle aspiration on June 16, 2020 FINAL MICROSCOPIC DIAGNOSIS:  - Atypical cells present    Afirma monitor study from June 16, 2020 sample showed approximately 50% risk of malignancy   September 13, 2020 total thyroidectomy FINAL MICROSCOPIC DIAGNOSIS:   A. THYROID AND RIGHT NECK CONTENTS, NECK MASS EXCISION AND THYROIDECTOMY  - Papillary thyroid carcinoma, classical variant, with extensive  dedifferentiation into diffuse sclerosing variant. See comment  - Inked resection margin is widely positive for carcinoma  - Microscopic extrathyroidal extension is identified  - Negative for lymphovascular invasion  - Benign lymph node (0/1)  - Background thyroid parenchyma with lymphocytic thyroiditis  - See oncology table and comment   Oct 20, 2020 IMPRESSION: Per oral administration of 103 mCi I-131 sodium iodide for thyroid remnant  ablation and thyroid cancer adjuvant therapy.   Post therapy whole-body scan on October 29, 2020  IMPRESSION: LEFT thyroid remnant.   Focal increased tracer uptake at the inferior RIGHT cervical region question metastatic focus to high cervical lymph node or skull base.   Abnormal tracer uptake at the inferior RIGHT hemithorax concerning for metastasis.   Consider further assessment by either noncontrast CT or PET-CT; would avoid iodinated contrast material.   Assessment & Plan:   1.  Metastatic papillary thyroid cancer-classic type pT4b, pN0  2. Hypothyroidism-postsurgical  See above.   I have reviewed her interval history and developments.   She is status post total thyroidectomy for metastatic papillary thyroid cancer with possible regional spread.  She will need further surveillance with CT PET.  This study will be ordered to be done as soon as possible.  If she is found to have localized remnant disease or amenable metastasis, she will be considered for surgical referral again.  Regarding her postsurgical hypothyroidism: She does not have recent thyroid function test.  She is advised to continue levothyroxine 75 mcg p.o. every morning. She will not tolerate any higher dose.  She will be sent to lab for thyroid function test, as well as thyroglobulin level.   - We discussed about the correct intake of her thyroid hormone, on empty stomach at fasting, with water, separated by at least 30 minutes from breakfast and other medications,  and separated by more than 4 hours from calcium, iron, multivitamins, acid reflux medications (PPIs). -Patient is made aware of the fact that thyroid hormone replacement is needed for life, dose to be adjusted by periodic monitoring of thyroid function tests. I had a long discussion with the patient about the absolute need for follow-up and complete surveillance studies on time in order to achieve remission and produce a chance to diagnose recurrence on time for optimal treatment outcomes.   - I did not initiate any new prescriptions today. - she is advised to maintain close follow up with Lindell Spar, MD for primary care needs.   I spent 42 minutes in the care of the  patient today including review of her maging/biopsy records (current and previous including abstractions from other facilities, surgical samples, explaining different types of thyroid malignancy, approaches to treatment, her recent labs including thyroid function , CMP, and other relevant labs ; i); face-to-face time discussing  her lab results and symptoms, medications doses, her  options of short and long term treatment based on the latest standards of care / guidelines;   and documenting the encounter.  Marilyn Solomon  participated in the discussions, expressed understanding, and voiced agreement with the above plans.  All questions were answered to her satisfaction. she is encouraged to contact clinic should she have any questions or concerns prior to her return visit.   Follow up plan: Return in about 2 weeks (around 11/25/2020), or and PET CT, for F/U with Pre-visit Labs.  Glade Lloyd, MD [facility] [address] [place], [place] [address] Phone: [phone]  Fax: [phone]     11/11/2020, 9:34 AM  This note was partially dictated with voice recognition software. Similar sounding words can be transcribed inadequately or may not  be corrected upon review.

## 2020-11-12 ENCOUNTER — Other Ambulatory Visit: Payer: Self-pay | Admitting: Internal Medicine

## 2020-11-12 DIAGNOSIS — G8929 Other chronic pain: Secondary | ICD-10-CM

## 2020-11-18 DIAGNOSIS — C73 Malignant neoplasm of thyroid gland: Secondary | ICD-10-CM | POA: Diagnosis not present

## 2020-11-18 DIAGNOSIS — E890 Postprocedural hypothyroidism: Secondary | ICD-10-CM | POA: Diagnosis not present

## 2020-11-19 ENCOUNTER — Other Ambulatory Visit: Payer: Self-pay | Admitting: "Endocrinology

## 2020-11-19 DIAGNOSIS — E039 Hypothyroidism, unspecified: Secondary | ICD-10-CM

## 2020-11-19 MED ORDER — LEVOTHYROXINE SODIUM 50 MCG PO TABS: 50.0000 ug | ORAL_TABLET | Freq: Every day | ORAL | 3 refills | Status: DC

## 2020-11-25 ENCOUNTER — Ambulatory Visit: Payer: Medicare Other | Admitting: Nurse Practitioner

## 2020-11-25 DIAGNOSIS — J441 Chronic obstructive pulmonary disease with (acute) exacerbation: Secondary | ICD-10-CM | POA: Diagnosis not present

## 2020-11-25 LAB — TSH: TSH: 1.09 u[IU]/mL (ref 0.450–4.500)

## 2020-11-25 LAB — THYROGLOBULIN LEVEL: Thyroglobulin (TG-RIA): 2.8 ng/mL

## 2020-11-25 LAB — THYROGLOBULIN ANTIBODY: Thyroglobulin Antibody: 13.6 IU/mL — ABNORMAL HIGH (ref 0.0–0.9)

## 2020-11-25 LAB — T4, FREE: Free T4: 1.87 ng/dL — ABNORMAL HIGH (ref 0.82–1.77)

## 2020-12-02 ENCOUNTER — Other Ambulatory Visit: Payer: Self-pay

## 2020-12-02 ENCOUNTER — Telehealth: Payer: Self-pay

## 2020-12-02 ENCOUNTER — Ambulatory Visit (HOSPITAL_COMMUNITY)
Admission: RE | Admit: 2020-12-02 | Discharge: 2020-12-02 | Disposition: A | Discharge status: Home / Self Care | Payer: Medicare Other | Source: Ambulatory Visit | Attending: "Endocrinology | Admitting: "Endocrinology

## 2020-12-02 DIAGNOSIS — C73 Malignant neoplasm of thyroid gland: Secondary | ICD-10-CM | POA: Insufficient documentation

## 2020-12-02 DIAGNOSIS — E890 Postprocedural hypothyroidism: Secondary | ICD-10-CM | POA: Diagnosis not present

## 2020-12-02 DIAGNOSIS — Z79899 Other long term (current) drug therapy: Secondary | ICD-10-CM | POA: Insufficient documentation

## 2020-12-02 LAB — GLUCOSE, CAPILLARY: Glucose-Capillary: 121 mg/dL — ABNORMAL HIGH (ref 70–99)

## 2020-12-02 MED ORDER — FLUDEOXYGLUCOSE F - 18 (FDG) INJECTION: 5.0000 | Freq: Once | INTRAVENOUS | Status: DC | PRN

## 2020-12-02 NOTE — Telephone Encounter (Signed)
Brandi from the preservice center called to let us know that we did not have the facility in the PA. Called [employer] today. They state that the PA does not need to be site specific. They gave me a REF # of [id number]

## 2020-12-03 ENCOUNTER — Telehealth: Payer: Self-pay | Admitting: Nurse Practitioner

## 2020-12-03 ENCOUNTER — Ambulatory Visit (HOSPITAL_COMMUNITY): Admission: RE | Admit: 2020-12-03 | Payer: Medicare Other | Source: Ambulatory Visit

## 2020-12-03 NOTE — Telephone Encounter (Signed)
Called pt and left msg for her to call back. Also called Dr Redmond Baseman office to see if she has appt. Did not get an answer form that office.

## 2020-12-03 NOTE — Telephone Encounter (Signed)
Pt answered but phone is so distorted I cannot hear anything she is saying. I asked pt to call back. She did not call back.

## 2020-12-03 NOTE — Telephone Encounter (Signed)
We are sending pt a letter for her to come by office for results.

## 2020-12-06 NOTE — Telephone Encounter (Signed)
FYI Pt called . Her phone does not work well at all  but I did hear her say she would be here Wednesday to discuss results.

## 2020-12-08 ENCOUNTER — Encounter: Payer: Self-pay | Admitting: Nurse Practitioner

## 2020-12-08 ENCOUNTER — Ambulatory Visit: Payer: Medicare Other | Admitting: Nurse Practitioner

## 2020-12-08 VITALS — BP 132/74 | HR 99 | Ht 65.0 in | Wt 101.0 lb

## 2020-12-08 DIAGNOSIS — C73 Malignant neoplasm of thyroid gland: Secondary | ICD-10-CM | POA: Diagnosis not present

## 2020-12-08 DIAGNOSIS — E039 Hypothyroidism, unspecified: Secondary | ICD-10-CM

## 2020-12-08 NOTE — Patient Instructions (Signed)

## 2020-12-08 NOTE — Progress Notes (Signed)
12/08/2020, 12:43 PM   Endocrinology follow-up note  Subjective:    Patient ID: Marilyn Solomon, female    DOB: 1945/06/21, PCP Lindell Spar, MD   Past Medical History:  Diagnosis Date   Anemia    Anxiety    Arthritis    C. difficile diarrhea 10/08/2019   Chronic back pain    COPD (chronic obstructive pulmonary disease) ([place])    GERD (gastroesophageal reflux disease)    HCAP (healthcare-associated pneumonia) 06/28/2017   Hypertension    Hypothyroidism    On home oxygen therapy    2L   Pneumonia due to COVID-19 virus 09/28/2019   Reflux    no medications currently, asymptomatic   Thyroid disease    Past Surgical History:  Procedure Laterality Date   ABDOMINAL HYSTERECTOMY     BIOPSY  10/08/2017   Procedure: BIOPSY;  Surgeon: Daneil Dolin, MD;  [facility];  Service: Endoscopy;;  gastric   cataracts Bilateral    COLONOSCOPY  2007   Dr. Gala Romney: normal rectum, diverticula    COLONOSCOPY WITH PROPOFOL N/A 11/15/2015   Dr. Gala Romney: grade II hemorrhoids, diverticulosis   COLONOSCOPY WITH PROPOFOL N/A 10/08/2017   Rourk: Diverticulosis   ESOPHAGOGASTRODUODENOSCOPY (EGD) WITH PROPOFOL N/A 10/08/2017   Rourk: Nonobstructing Schatzki ring, mild erosive reflux esophagitis, small hiatal hernia, gastritis but no H. pylori.   FRACTURE SURGERY Right 2005   Wrist   JOINT REPLACEMENT Left 2000   hip   RADICAL NECK DISSECTION Right 09/13/2020   Procedure: RADICAL NECK DISSECTION;  Surgeon: Melida Quitter, MD;  Location: [place];  Service: ENT;  Laterality: Right;   THYROIDECTOMY N/A 09/13/2020   Procedure: TOTAL THYROIDECTOMY;  Surgeon: Melida Quitter, MD;  Location: [place];  Service: ENT;  Laterality: N/A;   TOTAL HIP ARTHROPLASTY Left    WRIST SURGERY Right    Social History   Socioeconomic History   Marital status: Widowed    Spouse name: Not on file   Number of children: Not on file   Years of education: Not on file    Highest education level: Not on file  Occupational History   Occupation: disability  Tobacco Use   Smoking status: Former    Packs/day: 25.00    Years: 1.00    Pack years: 25.00    Types: Cigarettes    Quit date: 08/27/1998    Years since quitting: 22.2   Smokeless tobacco: Never   Tobacco comments:    quit in 2000  Vaping Use   Vaping Use: Never used  Substance and Sexual Activity   Alcohol use: No    Alcohol/week: 0.0 standard drinks   Drug use: No   Sexual activity: Yes    Birth control/protection: Surgical  Other Topics Concern   Not on file  Social History Narrative   Not on file   Social Determinants of Health   Financial Resource Strain: Low Risk    Difficulty of Paying Living Expenses: Not hard at all  Food Insecurity: No Food Insecurity   Worried About Charity fundraiser in the Last Year: Never true   [place] in the Last Year: Never true  Transportation Needs: No Transportation Needs   Lack of Transportation (  Medical): No   Lack of Transportation (Non-Medical): No  Physical Activity: Insufficiently Active   Days of Exercise per Week: 7 days   Minutes of Exercise per Session: 10 min  Stress: No Stress Concern Present   Feeling of Stress : Not at all  Social Connections: Socially Isolated   Frequency of Communication with Friends and Family: More than three times a week   Frequency of Social Gatherings with Friends and Family: Once a week   Attends Religious Services: Never   Marine scientist or Organizations: No   Attends Archivist Meetings: Never   Marital Status: Widowed   Family History  Problem Relation Age of Onset   Bone cancer Sister    Pancreatic cancer Sister    Pneumonia Mother    Heart attack Father    Diabetes Sister    Dementia Sister    Colon cancer Neg Hx    Outpatient Encounter Medications as of 12/08/2020  Medication Sig   albuterol (VENTOLIN HFA) 108 (90 Base) MCG/ACT inhaler Inhale 1-2 puffs into the  lungs every 6 (six) hours as needed for wheezing or shortness of breath.   ALPRAZolam (XANAX) 1 MG tablet Take 1 tablet (1 mg total) by mouth every 8 (eight) hours as needed for anxiety.   amLODipine (NORVASC) 5 MG tablet TAKE 1 TABLET BY MOUTH DAILY.   Fluticasone-Umeclidin-Vilant (TRELEGY ELLIPTA) 100-62.5-25 MCG/INH AEPB Inhale 1 puff into the lungs daily as needed (respiratory issues).   ibuprofen (ADVIL) 200 MG tablet Take 400 mg by mouth every 8 (eight) hours as needed (pain).   ipratropium-albuterol (DUONEB) 0.5-2.5 (3) MG/3ML SOLN Take 3 mLs by nebulization every 6 (six) hours as needed (shortness of breath or wheezing).   levothyroxine (SYNTHROID) 50 MCG tablet Take 1 tablet (50 mcg total) by mouth daily. (Patient taking differently: Take 75 mcg by mouth daily.)   oxyCODONE (ROXICODONE) 15 MG immediate release tablet TAKE (1) TABLET BY MOUTH EVERY 8 HOURS AS NEEDED.   pantoprazole (PROTONIX) 40 MG tablet TAKE 1 TABLET BY MOUTH DAILY. (Patient taking differently: Take 40 mg by mouth daily.)   Vitamin D, Ergocalciferol, (DRISDOL) 1.25 MG (50000 UNIT) CAPS capsule Take 50,000 Units by mouth every Sunday.   No facility-administered encounter medications on file as of 12/08/2020.   ALLERGIES: Allergies  Allergen Reactions   Penicillins Itching     Tolerated Ancef Intraop 09/13/20   Vicodin [Hydrocodone-Acetaminophen] Nausea And Vomiting    VACCINATION STATUS: Immunization History  Administered Date(s) Administered   Influenza Split 03/08/2016   Influenza-Unspecified 01/28/2020   Moderna Sars-Covid-2 Vaccination 10/30/2019, 11/27/2019   Pneumococcal Polysaccharide-23 06/29/2017    HPI Marilyn Solomon is 75 y.o. female who was seen prior to February 2022 in consultation for multinodular goiter and hypothyroidism.   She was originally referred by her PMD: Lindell Spar, MD.   Work-up of 2.3 cm right thyroid lobe nodule was malignant.  She was sent for surgical treatment in  [facility] with Dr. Harlow Asa during her last visit in February 2022.   She did not return for follow-up. Patient was related high risk with chronic oxygen therapy for pulmonary disease related to chronic heavy smoking in the past.  Dr. Harlow Asa sent her to otolaryngology at [facility] for better management.  Patient had scattered adenopathy on CT scan of head and neck which was suspected for metastatic disease.  Flexible laryngoscopy performed prior to her surgery showed paralyzed right vocal cord as a source of voice hoarseness  concerning for malignancy involving the right recurrent laryngeal nerve.  Because of her risk profile, she was sent to Dr. Redmond Baseman who recommended total thyroidectomy right neck dissection and possible nerve monitoring. She underwent the surgery on September 13, 2020. Final microscopic diagnosis was papillary thyroid carcinoma, classical variant, with extensive dedifferentiation into diffuse sclerosing variant. Further description of the surgical sample showed widely positive margins for carcinoma, microscopic extrathyroidal extension, negative lymphovascular invasion, benign lymph node, background thyroid parenchyma with lymphocytic thyroiditis.  Subsequently, between May 25 and October 29, 2020 she underwent Thyrogen stimulated remnant ablation with whole-body scan. She received 103 mCi of I-131.  Whole-body scan posttherapy on October 29, 2020 showed left thyroid remnant.  Focal increased tracer uptake at the inferior right cervical region question metastatic focus too high cervical lymph node  or skull base.  Abnormal tracer uptake of the inferior right hemithorax concerning for metastasis. Noncontrast CT or PET/CT was recommended. She is currently on Synthroid 75 mcg p.o. daily with good consistency.  She does not have recent thyroid function test. She comes with 7 pound weight loss since her last visit.  She still has hoarse voice.  Remains on oxygen supplement and  bronchodilators related to her COPD.    She is a patient with advanced COPD on portable oxygen supplement for several years. She denies dysphagia.  She denies palpitations, tremors, heat/cold intolerance.  She was diagnosed with hypothyroidism few years ago was already taking levothyroxine 75 mcg if improved prior to her surgery.   -She denies family history of thyroid malignancy.  Review of Systems  Constitutional: stable weight , + fatigue, no subjective hyperthermia, no subjective hypothermia  Respiratory: + Uses portable oxygen canister, no cough, no shortness of breath ENT: sore throat   Objective:    Vitals with BMI 12/08/2020 11/11/2020 09/17/2020  Height 5\' 5"  5\' 5"  -  Weight 101 lbs 100 lbs 10 oz -  BMI 11.91 47.82 -  Systolic 956 213 086  Diastolic 74 65 63  Pulse 99 101 84     BP 132/74   Pulse 99   Ht 5\' 5"  (1.651 m)   Wt 101 lb (45.8 kg)   BMI 16.81 kg/m   Wt Readings from Last 3 Encounters:  12/08/20 101 lb (45.8 kg)  11/11/20 100 lb 9.6 oz (45.6 kg)  09/13/20 103 lb 1 oz (46.7 kg)    Physical Exam  Constitutional:  Body mass index is 16.81 kg/m.,  not in acute distress, normal state of mind Eyes: PERRLA, EOMI, no exophthalmos ENT: Well-healed surgical scar on anterior lower neck, redness noted to anterior neck-chronic from radiation?    CMP ( most recent) CMP     Component Value Date/Time   NA 142 09/15/2020 0549   NA 142 06/03/2020 1019   K 4.1 09/15/2020 0549   CL 105 09/15/2020 0549   CO2 31 09/15/2020 0549   GLUCOSE 93 09/15/2020 0549   BUN 12 09/15/2020 0549   BUN 22 06/03/2020 1019   CREATININE 0.96 09/15/2020 0549   CALCIUM 8.5 (L) 09/17/2020 1206   PROT 5.8 (L) 09/14/2020 0517   PROT 7.7 06/03/2020 1019   ALBUMIN 2.7 (L) 09/14/2020 0517   ALBUMIN 4.2 06/03/2020 1019   AST 22 09/14/2020 0517   ALT 11 09/14/2020 0517   ALKPHOS 54 09/14/2020 0517   BILITOT 0.7 09/14/2020 0517   BILITOT 0.2 06/03/2020 1019   GFRNONAA >60 09/15/2020  0549   GFRAA 74 06/03/2020 1019     Diabetic  Labs (most recent): Lab Results  Component Value Date   HGBA1C 6.0 (H) 06/03/2020   HGBA1C 5.6 11/07/2018   HGBA1C 5.5 06/29/2017     Lipid Panel ( most recent) Lipid Panel     Component Value Date/Time   CHOL 190 06/03/2020 1019   TRIG 464 (H) 09/14/2020 0344   HDL 41 06/03/2020 1019   CHOLHDL 4.6 (H) 06/03/2020 1019   LDLCALC 121 (H) 06/03/2020 1019   LABVLDL 28 06/03/2020 1019        Thyroid ultrasound on June 07, 2020 - Right lobe measured 5.1 cm with 2.3 cm suspicious nodule, left lobe 2.6 cm with 1 cm nodule.      Fine-needle aspiration on June 16, 2020 FINAL MICROSCOPIC DIAGNOSIS:  - Atypical cells present    Afirma monitor study from June 16, 2020 sample showed approximately 50% risk of malignancy   September 13, 2020 total thyroidectomy FINAL MICROSCOPIC DIAGNOSIS:   A. THYROID AND RIGHT NECK CONTENTS, NECK MASS EXCISION AND THYROIDECTOMY  - Papillary thyroid carcinoma, classical variant, with extensive  dedifferentiation into diffuse sclerosing variant. See comment  - Inked resection margin is widely positive for carcinoma  - Microscopic extrathyroidal extension is identified  - Negative for lymphovascular invasion  - Benign lymph node (0/1)  - Background thyroid parenchyma with lymphocytic thyroiditis  - See oncology table and comment   Oct 20, 2020 IMPRESSION: Per oral administration of 103 mCi I-131 sodium iodide for thyroid remnant  ablation and thyroid cancer adjuvant therapy.   Post therapy whole-body scan on October 29, 2020  IMPRESSION: LEFT thyroid remnant.   Focal increased tracer uptake at the inferior RIGHT cervical region question metastatic focus to high cervical lymph node or skull base.   Abnormal tracer uptake at the inferior RIGHT hemithorax concerning for metastasis.   Consider further assessment by either noncontrast CT or PET-CT; would avoid iodinated contrast  material.   PET scan results from 12/02/20 CLINICAL DATA:  Subsequent treatment strategy for papillary thyroid carcinoma. Patient status post thyroidectomy and adjuvant therapy with 233 millicuries on 00/76/2263   EXAM: NUCLEAR MEDICINE PET SKULL BASE TO THIGH   TECHNIQUE: 5.0 mCi F-18 FDG was injected intravenously. Full-ring PET imaging was performed from the skull base to thigh after the radiotracer. CT data was obtained and used for attenuation correction and anatomic localization.   Fasting blood glucose: 120 mg/dl   COMPARISON:  Neck CT 08/05/2020, I 131 whole body scan 10/29/2020   FINDINGS: Mediastinal blood pool activity: SUV max 1.9   Liver activity: SUV max NA   NECK: Intense metabolic activity associated with soft tissue thickening in the RIGHT thyroid bed which extends beneath the RIGHT strap muscle. Tissue measures 2.3 x 2.2 cm (image 45) with intense metabolic activity (SUV max equal 17.3.)   There is intense activity midline posterior the larynx. Activity intense with with SUV max equal 14.2. Tissue planes are difficult to define a posterior to the larynx. The hypermetabolic tissue posterior larynx is contiguous with the hypermetabolic mass in the RIGHT neck.   Of note, the I 131 post therapy scan does not demonstrate significant activity associated with this bulky tissue in the RIGHT thyroid bed. That is I 131 uptake is discordant with FDG activity.   No discrete hypermetabolic lymph nodes in the neck.   Incidental CT findings: none   CHEST: Focus hypermetabolic activity in the anterior RIGHT middle lobe corresponds to nodule pleural thickening measuring 13 mm on image 75/4 with SUV max equal  4.5. Lingular nodule measuring 7 mm (image 76) also has metabolic activity SUV max equal 1.4.   Incidental CT findings: none   ABDOMEN/PELVIS: No abnormal hypermetabolic activity within the liver, pancreas, adrenal glands, or spleen. No hypermetabolic  lymph nodes in the abdomen or pelvis.   Within the deep LEFT pelvis there is a focus of metabolic activity anterior to the distal sacrum/adjacent to the sigmoid colon/rectum. This difficult define as there is significant streak artifact from the hip prosthetic. Activity intense with SUV max equal 6.6 on image 166.   Note there is a small focus of activity in the posterior pelvis on I 131 uptake scan   Incidental CT findings: none   SKELETON: There is intense metabolic activity associated with the LEFT hip prosthetic. Within the posterior to the LEFT hip prosthetic there is soft tissue density which is not changed from CT 605/03/2020. Soft tissue component measures 4.2 by 2.2 cm. There is diffuse activity surrounding the femoral component as well as the acetabular component SUV max equal 7.9   Incidental CT findings: None   IMPRESSION: 1. Intense metabolic activity associated soft tissue thickening in the RIGHT thyroidectomy bed and midline post laryngeal soft tissue. Findings consistent residual thyroid carcinoma. Of note, there is a discrepancy between the I 131 uptake on recent scan of 10/29/2020 and the current hypermetabolic activity. This could indicate residual poorly differentiated papillary thyroid carcinoma. 2. Hypermetabolic activity associated with a RIGHT middle lobe and LEFT upper lobe pulmonary nodule is consistent with thyroid cancer pulmonary metastasis. No diffuse miliary disease noted. 3. Hypermetabolic focus in the deep LEFT pelvis could represent a peritoneal implant. 4. Intense metabolic activity associated with the LEFT hip prosthetic is favored inflammatory. There is soft tissue lytic destruction of the ischium posterior to the prosthetic. Burtis Junes a chronic finding as was present on CT 10/07/2019. No I 131 uptake in this region.   These results will be called to the ordering clinician or representative by the Radiologist Assistant, and  communication documented in the PACS or [employer].     Electronically Signed   By: Suzy Bouchard M.D.   On: 12/02/2020 16:36  Assessment & Plan:   1.  Metastatic papillary thyroid cancer-classic type pT4b, pN0  2. Hypothyroidism-postsurgical  See above.  I have reviewed her interval history and developments.   She is status post total thyroidectomy for metastatic papillary thyroid cancer with possible regional spread.  Her repeat PET scan shows areas of concern consistent with residual thyroid carcinoma.  She has been having trouble with her telephone, had it fixed yesterday.  She has tried to call Dr. Redmond Baseman office multiple times without any luck to discuss the PET scan or schedule an appointment.  We discussed the findings briefly today and I urged her to reach back out to Dr. Redmond Baseman for further evaluation and treatment.  Regarding her postsurgical hypothyroidism: Her most recent thyroid function tests are consistent with appropriate hormone replacement.  She is advised to continue Levothyroxine 75 mcg po daily before breakfast.  Her thyroglobulin antibody was positive as well.   - We discussed about the correct intake of her thyroid hormone, on empty stomach at fasting, with water, separated by at least 30 minutes from breakfast and other medications,  and separated by more than 4 hours from calcium, iron, multivitamins, acid reflux medications (PPIs). -Patient is made aware of the fact that thyroid hormone replacement is needed for life, dose to be adjusted by periodic monitoring of thyroid function tests.  I had a long discussion with the patient about the absolute need for follow-up and complete surveillance studies on time in order to achieve remission and produce a chance to diagnose recurrence on time for optimal treatment outcomes.   - I did not initiate any new prescriptions today. - she is advised to maintain close follow up with Lindell Spar, MD for primary care  needs.     I spent 30 minutes in the care of the patient today including review of labs from [place], Lipids, Thyroid Function, Hematology (current and previous including abstractions from other facilities); face-to-face time discussing  her blood glucose readings/logs, discussing hypoglycemia and hyperglycemia episodes and symptoms, medications doses, her options of short and long term treatment based on the latest standards of care / guidelines;  discussion about incorporating lifestyle medicine;  and documenting the encounter.    Please refer to Patient Instructions for Blood Glucose Monitoring and Insulin/Medications Dosing Guide"  in media tab for additional information. Please  also refer to " Patient Self Inventory" in the Media  tab for reviewed elements of pertinent patient history.  Marilyn Solomon participated in the discussions, expressed understanding, and voiced agreement with the above plans.  All questions were answered to her satisfaction. she is encouraged to contact clinic should she have any questions or concerns prior to her return visit.   Follow up plan: Return in about 4 months (around 04/10/2021) for Thyroid follow up, Previsit labs.  Rayetta Pigg, [facility] [address] [place], [place] [address] Phone: [phone] Fax: [phone]    12/08/2020, 12:43 PM

## 2020-12-10 ENCOUNTER — Other Ambulatory Visit: Payer: Self-pay | Admitting: Internal Medicine

## 2020-12-10 DIAGNOSIS — G8929 Other chronic pain: Secondary | ICD-10-CM

## 2020-12-10 DIAGNOSIS — E039 Hypothyroidism, unspecified: Secondary | ICD-10-CM

## 2020-12-13 ENCOUNTER — Telehealth: Payer: Self-pay | Admitting: Internal Medicine

## 2020-12-13 NOTE — Telephone Encounter (Signed)
Spoke with the pt  She states needing refill on duoneb  We have never filled before  She still has trelegy and takes this maybe a couple times per wk and uses albuterol inhaler daily I reminded her trelegy is to be taken daily and albuterol prn She is overdue f/u and I scheduled appt in [place] 12/16/20 at 11:30- aware new location    Dr [name]- can we sent rx for duoneb?

## 2020-12-14 MED ORDER — ALBUTEROL SULFATE (2.5 MG/3ML) 0.083% IN NEBU: 2.5000 mg | INHALATION_SOLUTION | Freq: Four times a day (QID) | RESPIRATORY_TRACT | 2 refills | Status: DC | PRN

## 2020-12-14 NOTE — Telephone Encounter (Signed)
Tanda Rockers, MD  Rosana Berger, CMA Caller: Unspecified Wilburn Mylar,  2:52 PM) The prescription should be for pure albuterol not duoneb  for 2.5 mg every fourhours as needed  I have sent rx for plain albuterol to preferred pharm. Called pt and there was no answer. LMTCB.

## 2020-12-14 NOTE — Telephone Encounter (Signed)
Checking on rx

## 2020-12-14 NOTE — Telephone Encounter (Signed)
Pt returned call. Advised pt rx for plain albuterol to preferred pharm per Clinical staff. Asked if office could do anything at this time. Nothing else needed.

## 2020-12-16 ENCOUNTER — Other Ambulatory Visit: Payer: Self-pay

## 2020-12-16 ENCOUNTER — Ambulatory Visit: Payer: Medicare Other | Admitting: Internal Medicine

## 2020-12-16 ENCOUNTER — Telehealth: Payer: Self-pay | Admitting: Internal Medicine

## 2020-12-16 ENCOUNTER — Encounter: Payer: Self-pay | Admitting: Internal Medicine

## 2020-12-16 DIAGNOSIS — J449 Chronic obstructive pulmonary disease, unspecified: Secondary | ICD-10-CM

## 2020-12-16 DIAGNOSIS — J9612 Chronic respiratory failure with hypercapnia: Secondary | ICD-10-CM

## 2020-12-16 DIAGNOSIS — K589 Irritable bowel syndrome without diarrhea: Secondary | ICD-10-CM

## 2020-12-16 DIAGNOSIS — J9611 Chronic respiratory failure with hypoxia: Secondary | ICD-10-CM | POA: Diagnosis not present

## 2020-12-16 MED ORDER — PREDNISONE 10 MG PO TABS: ORAL_TABLET | ORAL | 0 refills | Status: DC

## 2020-12-16 NOTE — Assessment & Plan Note (Signed)
HCO3  01/27/20   = 34  - as of 05/11/2020  2lpm hs and prn daytime   rec 2lpm hs and Make sure you check your oxygen saturation  at your highest level of activity  to be sure it stays over 90% and adjust  02 flow upward to maintain this level if needed but remember to turn it back to previous settings when you stop (to conserve your supply).

## 2020-12-16 NOTE — Assessment & Plan Note (Signed)
Quit smoking 2000 - maint on trelegy as of 05/11/2020  - Labs rec 12/16/2020  :     alpha one AT phenotype/ Quant Ig's     Mild flare in setting of recent thyroid surgery in pt with apparent metatstatic thyroid cancer   rec Prednisone 10 mg take  4 each am x 2 days,   2 each am x 2 days,  1 each am x 2 days and stop  Max gerd rx  Check labs as above May need to change trelegy to hfa or smi or neb if upper airway symptoms persist    >>> f/u  4-6 weeks with pfts if possible           Each maintenance medication was reviewed in detail including emphasizing most importantly the difference between maintenance and prns and under what circumstances the prns are to be triggered using an action plan format where appropriate.  Total time for H and P, chart review, counseling, reviewing elipa/ hfa  device(s) and generating customized AVS unique to this office visit / same day charting = 32 min

## 2020-12-16 NOTE — Telephone Encounter (Signed)
Left message

## 2020-12-16 NOTE — Patient Instructions (Addendum)
Prednisone 10 mg take  4 each am x 2 days,   2 each am x 2 days,  1 each am x 2 days and stop    Continue Pantoprazole (protonix) 40 mg   Take  30-60 min before first meal of the day and add Pepcid (famotidine)  20 mg after supper until  you see Dr Redmond Baseman  Please remember to go to the lab department   for your tests - we will call you with the results when they are available.      Please schedule a follow up office visit in 6 weeks, call sooner if needed with all medications /inhalers/ solutions in hand so we can verify exactly what you are taking. This includes all medications from all doctors and over the counters  - PFTs in meantime if possible

## 2020-12-16 NOTE — Assessment & Plan Note (Signed)
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

## 2020-12-16 NOTE — Telephone Encounter (Signed)
Please call the patient regarding medication

## 2020-12-16 NOTE — Progress Notes (Signed)
Marilyn Solomon, female    DOB: 18-Oct-1945,    MRN: [id number]   Brief patient profile:  48 yowf quit smoking 07/1998 at dx of L PTX which did not recur p L Chest tube not limited by breathing or coughing but around  Around 2009 with dx of copd and atypical TB by Luan Pulling and coughing ever since s/p admit:    Admit date: 01/22/2020 Discharge date: 01/27/2020   Admitted From: Home Disposition: Home.  Patient refused SNF placement   Recommendations for Outpatient Follow-up:  Follow up with PCP in 1 week with repeat CBC/BMP Outpatient follow-up with pulmonary Follow up in ED if symptoms worsen or new appear     Home Health: Patient refused home health services Equipment/Devices: Continue supplemental oxygen.  Patient uses oxygen via nasal cannula at 2L/min at home normally.   Discharge Condition: Guarded CODE STATUS: Full Diet recommendation: Regular   Brief/Interim Summary: 75 year old female with COPD and chronic hypoxic respiratory failure failure on 2 L oxygen,?  MAI, hypothyroidism presented with worsening shortness of breath and cough.  On presentation, creatinine was 1.63, baseline creatinine 0.6.  Chest x-ray showed evidence of chronic changes, unable to rule out low-grade infection.  CT of the chest showed long-standing chronic bronchitis with multifocal airway collapse.   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 At the lung bases there is true consolidative opacities and acute on chronic infection is possible.  She was started on antibiotics.  Pulmonary was consulted.  Pulmonary recommended sputum AFB testing and outpatient follow-up with pulmonary along with antibiotic treatment for total of 10 days.  AFB x1 has been negative so far; second AFB test result is pending and third sputum sample will be collected prior to discharge today.  Patient is very deconditioned.  PT  recommended SNF placement.  Patient refused SNF placement and is also not agreeable for home health services.  She will be discharged home with outpatient follow-up with PCP and pulmonary.     Discharge Diagnoses:    Acute kidney injury -Probably from dehydration and poor oral intake.  Creatinine 1.63 on presentation, baseline creatinine 0.6.  Treated with IV fluids.  Creatinine 0.66 today.  No labs today.  Repeat a.m. labs. -Patient was restarted on IV fluids on 01/25/2020 evening because of very poor oral intake -Creatinine stable.  Outpatient follow-up of creatinine.   Acute on chronic hypoxic respiratory failure Probable community-acquired bacterial pneumonia Chronic lung disease/?  MAI -Started on Rocephin and Zithromax.  Subsequently switched to Levaquin 500 mg daily as per pulmonary recommendations.    Continue Levaquin for 5 more days to complete 10-day course of therapy of antibiotics as per pulmonary recommendations.  Outpatient follow-up with pulmonary. -Pulmonary recommended AFB x3 sputum  > 12/2819 grew M abscessus complex sensitive to macrolides      Acute metabolic encephalopathy -Patient was more drowsy on the evening of 01/25/2020 as per the nursing staff.  CT of the head was negative for acute intracranial abnormality.  Dose of Xanax decreased.  More awake but still very slow to respond.   -PT recommended SNF placement but patient adamant about not going to SNF and also adamant that she does not any home health services   Anxiety/depression -Patient is on very high dose of Xanax 1 mg 3 times a day as needed at home.  I have decreased it to 0.25 mg 3 times a day as needed.  Discharged home on  0.5 mg 3 times a day as needed.  Outpatient follow-up with PCP regarding follow-up of the same.   COPD -Continue home regimen. Outpatient follow-up with pulmonary   Hypothyroidism-continue levothyroxine   History of Present Illness  05/11/2020  Pulmonary/ 1st office eval/ Randale Carvalho /  [facility] re f/u atypical TB  Chief Complaint  Patient presents with   Hospitalization Follow-up    Breathing has improved since hospital d/c and she states not coughing at this time. She states using o2 "at home when I need it" and also at bedtime- 2lpm.   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  Pain should improve w/in 2 weeks and if not then consider further GI work up.    We will call to refer you to Internal Medicine in [place]  Please schedule a follow up visit in 3 months with pfts same day if possible  -need  alpha one blood test next time  you have your blood drawn - plus needs quant Ig's   Thyroid surgery September 13 2020   12/16/2020  f/u ov/[place] office/Mindy Gali re:  GOL D? Copd maint on trelegy  Chief Complaint  Patient presents with   Follow-up    Patient reports some short ness of breath and cough (white at times) She feels like its thick. She reports some ear pain and sinus pressure.   Dyspnea:  worse since thyroid surgery with dysphagia  Cough: white  Sleeping: lots of nasal and chest congestion better p neb alb  SABA use: helps some 02: 2lpm  Covid status: vax x 2      No obvious day to day or daytime variability or assoc  purulent sputum or mucus plugs or hemoptysis or cp or chest tightness, subjective wheeze or overt sinus or hb symptoms.   Also denies any obvious fluctuation of symptoms with weather or environmental changes or other aggravating or alleviating factors except as outlined above   No unusual exposure hx or h/o childhood  pna/ asthma or knowledge of premature birth.  Current Allergies, Complete Past Medical History, Past Surgical History, Family History, and Social History were reviewed in [employer] record.  ROS  The following are not active complaints unless bolded Hoarseness, sore throat, dysphagia, dental problems, itching, sneezing,  nasal congestion or discharge of excess mucus or purulent secretions, ear ache,   fever, chills, sweats, unintended wt loss or wt gain, classically pleuritic or exertional cp,  orthopnea pnd or arm/hand swelling  or leg swelling, presyncope, palpitations, abdominal pain, anorexia, nausea, vomiting, diarrhea  or change in bowel habits or change in bladder habits, change in stools or change in urine, dysuria, hematuria,  rash, arthralgias, visual complaints, headache, numbness, weakness or ataxia or problems with walking or coordination,  change in mood or  memory.        Current Meds  Medication Sig   albuterol (PROVENTIL) (2.5 MG/3ML)  0.083% nebulizer solution Take 3 mLs (2.5 mg total) by nebulization every 6 (six) hours as needed for wheezing or shortness of breath.   albuterol (VENTOLIN HFA) 108 (90 Base) MCG/ACT inhaler Inhale 1-2 puffs into the lungs every 6 (six) hours as needed for wheezing or shortness of breath.   ALPRAZolam (XANAX) 1 MG tablet Take 1 tablet (1 mg total) by mouth every 8 (eight) hours as needed for anxiety.   amLODipine (NORVASC) 5 MG tablet TAKE 1 TABLET BY MOUTH DAILY.   Fluticasone-Umeclidin-Vilant (TRELEGY ELLIPTA) 100-62.5-25 MCG/INH AEPB Inhale 1 puff into the lungs daily as needed (respiratory issues).   ibuprofen (ADVIL) 200 MG tablet Take 400 mg by mouth every 8 (eight) hours as needed (pain).   levothyroxine (SYNTHROID) 50 MCG tablet Take 1 tablet (50 mcg total) by mouth daily. (Patient taking differently: Take 75 mcg by mouth daily.)   oxyCODONE (ROXICODONE) 15 MG immediate release tablet TAKE (1) TABLET BY MOUTH EVERY 8 HOURS AS NEEDED.   pantoprazole (PROTONIX) 40 MG tablet TAKE 1 TABLET BY MOUTH DAILY. (Patient taking differently: Take 40 mg by mouth daily.)   Vitamin D, Ergocalciferol, (DRISDOL) 1.25 MG (50000 UNIT) CAPS capsule Take 50,000 Units by mouth every Sunday.               Past Medical History:  Diagnosis Date   Anemia    Anxiety    Arthritis    Chronic back pain    COPD (chronic obstructive pulmonary disease) (HCC)    GERD (gastroesophageal reflux disease)    Hypertension    Hypothyroidism    Reflux    no medications currently, asymptomatic   Thyroid disease         Objective:      12/16/2020        10 2 05/11/20 106 lb 8 oz (48.3 kg)  02/10/20 106 lb (48.1 kg)  01/22/20 114 lb (51.7 kg)       Vital signs reviewed  12/16/2020  - Note at rest 02 sats  97% on 2lpm   General appearance:    chronically ill appearing  hoarse wf with mostly upper airway cough   HEENT : pt wearing mask not removed for exam due to covid -19 concerns.    NECK :  without  JVD/Nodes/TM/ nl carotid upstrokes bilaterally   LUNGS: no acc muscle use,  Mod barrel  contour chest wall with bilateral  exp rhonchi better p PLM  and  without cough on insp or exp maneuvers and mod  Hyperresonant  to  percussion bilaterally  CV:  RRR  no s3 or murmur or increase in P2, and no edema   ABD:  soft and nontender with pos mid insp Hoover's  in the supine position. No bruits or organomegaly appreciated, bowel sounds nl  MS:     ext warm without deformities, calf tenderness, cyanosis or clubbing No obvious joint restrictions   SKIN: warm and dry without lesions    NEURO:  alert, approp, nl sensorium with  no motor or cerebellar deficits apparent.         I personally reviewed images and agree with radiology impression as follows:   Chest CT pet scan 12/02/20  1. Intense metabolic activity associated soft tissue thickening in the RIGHT thyroidectomy bed and midline post laryngeal soft tissue. Findings consistent residual thyroid carcinoma. Of note, there is a discrepancy between the I 131 uptake on recent scan of 10/29/2020 and the current hypermetabolic activity. This could indicate residual poorly differentiated papillary thyroid carcinoma. 2. Hypermetabolic activity associated with a RIGHT middle lobe and LEFT upper lobe pulmonary nodule is consistent with thyroid cancer pulmonary metastasis. No diffuse miliary disease noted. 3. Hypermetabolic focus in the deep LEFT pelvis could represent a peritoneal implant. 4. Intense metabolic activity associated with the LEFT hip prosthetic is favored inflammatory. There is soft tissue lytic destruction of the ischium posterior to the prosthetic. Burtis Junes a chronic finding as was present on CT 10/07/2019. No I 131 uptake in this region.     Assessment

## 2020-12-17 ENCOUNTER — Other Ambulatory Visit: Payer: Self-pay | Admitting: Internal Medicine

## 2020-12-17 DIAGNOSIS — J449 Chronic obstructive pulmonary disease, unspecified: Secondary | ICD-10-CM

## 2020-12-20 ENCOUNTER — Telehealth: Payer: Self-pay | Admitting: Internal Medicine

## 2020-12-20 ENCOUNTER — Other Ambulatory Visit: Payer: Self-pay | Admitting: Family Medicine

## 2020-12-20 MED ORDER — OXYCODONE HCL 15 MG PO TABS: 15.0000 mg | ORAL_TABLET | Freq: Three times a day (TID) | ORAL | 0 refills | Status: DC | PRN

## 2020-12-20 NOTE — Telephone Encounter (Signed)
Pt states that she received her thyroid medication refill, but never received her oxycodone. States she has been out 5 days, and needs refill. Marilyn Solomon patient).

## 2020-12-20 NOTE — Telephone Encounter (Signed)
Pt informed

## 2020-12-20 NOTE — Telephone Encounter (Signed)
I spoke with pt, see new phone message. Pt needing refill on oxycodone.

## 2020-12-20 NOTE — Telephone Encounter (Signed)
Please call the pt she states she has not received her medication --please call her

## 2020-12-25 DIAGNOSIS — J441 Chronic obstructive pulmonary disease with (acute) exacerbation: Secondary | ICD-10-CM | POA: Diagnosis not present

## 2020-12-29 ENCOUNTER — Emergency Department (HOSPITAL_COMMUNITY): Payer: Medicare Other

## 2020-12-29 ENCOUNTER — Other Ambulatory Visit: Payer: Self-pay

## 2020-12-29 ENCOUNTER — Emergency Department (HOSPITAL_COMMUNITY)
Admission: EM | Admit: 2020-12-29 | Discharge: 2020-12-29 | Disposition: A | Discharge status: Home / Self Care | Payer: Medicare Other | Attending: Emergency Medicine | Admitting: Emergency Medicine

## 2020-12-29 ENCOUNTER — Encounter (HOSPITAL_COMMUNITY): Payer: Self-pay | Admitting: *Deleted

## 2020-12-29 DIAGNOSIS — R059 Cough, unspecified: Secondary | ICD-10-CM | POA: Diagnosis present

## 2020-12-29 DIAGNOSIS — I10 Essential (primary) hypertension: Secondary | ICD-10-CM | POA: Diagnosis not present

## 2020-12-29 DIAGNOSIS — Z79899 Other long term (current) drug therapy: Secondary | ICD-10-CM | POA: Insufficient documentation

## 2020-12-29 DIAGNOSIS — E039 Hypothyroidism, unspecified: Secondary | ICD-10-CM | POA: Diagnosis not present

## 2020-12-29 DIAGNOSIS — J449 Chronic obstructive pulmonary disease, unspecified: Secondary | ICD-10-CM | POA: Diagnosis not present

## 2020-12-29 DIAGNOSIS — Z87891 Personal history of nicotine dependence: Secondary | ICD-10-CM | POA: Insufficient documentation

## 2020-12-29 DIAGNOSIS — Z96642 Presence of left artificial hip joint: Secondary | ICD-10-CM | POA: Insufficient documentation

## 2020-12-29 DIAGNOSIS — R042 Hemoptysis: Secondary | ICD-10-CM | POA: Diagnosis not present

## 2020-12-29 DIAGNOSIS — N189 Chronic kidney disease, unspecified: Secondary | ICD-10-CM | POA: Diagnosis not present

## 2020-12-29 DIAGNOSIS — I129 Hypertensive chronic kidney disease with stage 1 through stage 4 chronic kidney disease, or unspecified chronic kidney disease: Secondary | ICD-10-CM | POA: Diagnosis not present

## 2020-12-29 DIAGNOSIS — J40 Bronchitis, not specified as acute or chronic: Secondary | ICD-10-CM | POA: Insufficient documentation

## 2020-12-29 LAB — CBC WITH DIFFERENTIAL/PLATELET
Abs Immature Granulocytes: 0.04 10*3/uL (ref 0.00–0.07)
Basophils Absolute: 0 10*3/uL (ref 0.0–0.1)
Basophils Relative: 0 %
Eosinophils Absolute: 0.1 10*3/uL (ref 0.0–0.5)
Eosinophils Relative: 1 %
HCT: 36 % (ref 36.0–46.0)
Hemoglobin: 10.7 g/dL — ABNORMAL LOW (ref 12.0–15.0)
Immature Granulocytes: 1 %
Lymphocytes Relative: 12 %
Lymphs Abs: 1 10*3/uL (ref 0.7–4.0)
MCH: 25.7 pg — ABNORMAL LOW (ref 26.0–34.0)
MCHC: 29.7 g/dL — ABNORMAL LOW (ref 30.0–36.0)
MCV: 86.5 fL (ref 80.0–100.0)
Monocytes Absolute: 0.6 10*3/uL (ref 0.1–1.0)
Monocytes Relative: 8 %
Neutro Abs: 6.4 10*3/uL (ref 1.7–7.7)
Neutrophils Relative %: 78 %
Platelets: 243 10*3/uL (ref 150–400)
RBC: 4.16 MIL/uL (ref 3.87–5.11)
RDW: 14.1 % (ref 11.5–15.5)
WBC: 8.2 10*3/uL (ref 4.0–10.5)
nRBC: 0 % (ref 0.0–0.2)

## 2020-12-29 LAB — BASIC METABOLIC PANEL
Anion gap: 9 (ref 5–15)
BUN: 13 mg/dL (ref 8–23)
CO2: 29 mmol/L (ref 22–32)
Calcium: 8.6 mg/dL — ABNORMAL LOW (ref 8.9–10.3)
Chloride: 100 mmol/L (ref 98–111)
Creatinine, Ser: 0.67 mg/dL (ref 0.44–1.00)
GFR, Estimated: >60 mL/min (ref >60)
Glucose, Bld: 120 mg/dL — ABNORMAL HIGH (ref 70–99)
Potassium: 4 mmol/L (ref 3.5–5.1)
Sodium: 138 mmol/L (ref 135–145)

## 2020-12-29 MED ORDER — PREDNISONE 10 MG PO TABS: ORAL_TABLET | ORAL | 0 refills | Status: DC

## 2020-12-29 MED ORDER — BENZONATATE 200 MG PO CAPS: 200.0000 mg | ORAL_CAPSULE | Freq: Three times a day (TID) | ORAL | 0 refills | Status: DC

## 2020-12-29 NOTE — ED Triage Notes (Signed)
Pt with cough with blood starting today.  Denies any fevers.  Pt on home O2.

## 2020-12-29 NOTE — Discharge Instructions (Addendum)
Use your albuterol inhaler every 4-6 hours as needed.  Follow-up with your primary care provider or your pulmonologist for recheck.  Return to the emergency department for any new or worsening symptoms.  Your blood pressure this evening was elevated.  It is important that you take your blood pressure medications daily as directed.

## 2020-12-29 NOTE — ED Provider Notes (Signed)
[facility] EMERGENCY DEPARTMENT Provider Note   CSN: [id number] Arrival date & time: 12/29/20  1252     History Chief Complaint  Patient presents with   Cough    Marilyn Solomon is a 75 y.o. female.   Cough Associated symptoms: no chest pain, no chills, no fever, no headaches, no myalgias, no rash, no shortness of breath, no sore throat and no wheezing        Marilyn Solomon is a 75 y.o. female with past medical history of anemia and anxiety, hypertension and COPD.  She is on 2 L oxygen at home chronically.  she presents to the Emergency Department complaining of cough with blood-tinged sputum.  Episodes have been intermittent and starting several hours prior to arrival.  She describes occasional forceful coughing.  She denies any generalized body aches, fever, chest pain and leg swelling.  No known COVID exposures.  She had COVID in May of last year    Past Medical History:  Diagnosis Date   Anemia    Anxiety    Arthritis    C. difficile diarrhea 10/08/2019   Chronic back pain    COPD (chronic obstructive pulmonary disease) (HCC)    GERD (gastroesophageal reflux disease)    HCAP (healthcare-associated pneumonia) 06/28/2017   Hypertension    Hypothyroidism    On home oxygen therapy    2L   Pneumonia due to COVID-19 virus 09/28/2019   Reflux    no medications currently, asymptomatic   Thyroid disease     Patient Active Problem List   Diagnosis Date Noted   Complete paralysis of right vocal cord 08/27/2020   Hoarseness 08/27/2020   Malignant neoplasm of thyroid gland ([place]) 07/08/2020   Other chronic pain 06/17/2020   Multinodular goiter 06/10/2020   Neck mass 06/03/2020   Hearing abnormally acute, right 06/03/2020   Chronic respiratory failure with hypoxia and hypercapnia ([place]) 05/11/2020   Irritable bowel syndrome with atypical cp  05/11/2020   Acute kidney injury superimposed on CKD ([place]) 01/23/2020   Heme positive stool    Anemia 06/02/2018    Hypertension 06/02/2018   Tachycardia 01/31/2018   Positive colorectal cancer screening using Cologuard test 08/21/2017   Normocytic anemia    Anxiety 06/30/2017   Hypothyroidism 06/30/2017   GERD (gastroesophageal reflux disease) 06/30/2017   COPD GOLD  ?  06/30/2017   Elevated liver enzymes 06/30/2017   Severe Iron deficiency anemia 06/30/2017   Second degree hemorrhoids    Encounter for screening colonoscopy 10/28/2015    Past Surgical History:  Procedure Laterality Date   ABDOMINAL HYSTERECTOMY     BIOPSY  10/08/2017   Procedure: BIOPSY;  Surgeon: Daneil Dolin, MD;  Location: [facility];  Service: Endoscopy;;  gastric   cataracts Bilateral    COLONOSCOPY  2007   Dr. Gala Romney: normal rectum, diverticula    COLONOSCOPY WITH PROPOFOL N/A 11/15/2015   Dr. Gala Romney: grade II hemorrhoids, diverticulosis   COLONOSCOPY WITH PROPOFOL N/A 10/08/2017   Rourk: Diverticulosis   ESOPHAGOGASTRODUODENOSCOPY (EGD) WITH PROPOFOL N/A 10/08/2017   Rourk: Nonobstructing Schatzki ring, mild erosive reflux esophagitis, small hiatal hernia, gastritis but no H. pylori.   FRACTURE SURGERY Right 2005   Wrist   JOINT REPLACEMENT Left 2000   hip   RADICAL NECK DISSECTION Right 09/13/2020   Procedure: RADICAL NECK DISSECTION;  Surgeon: Melida Quitter, MD;  Location: [place];  Service: ENT;  Laterality: Right;   THYROIDECTOMY N/A 09/13/2020   Procedure: TOTAL THYROIDECTOMY;  Surgeon:  Melida Quitter, MD;  Location: [place];  Service: ENT;  Laterality: N/A;   TOTAL HIP ARTHROPLASTY Left    WRIST SURGERY Right      OB History   No obstetric history on file.     Family History  Problem Relation Age of Onset   Bone cancer Sister    Pancreatic cancer Sister    Pneumonia Mother    Heart attack Father    Diabetes Sister    Dementia Sister    Colon cancer Neg Hx     Social History   Tobacco Use   Smoking status: Former    Packs/day: 25.00    Years: 1.00    Pack years: 25.00    Types: Cigarettes     Quit date: 08/27/1998    Years since quitting: 22.3   Smokeless tobacco: Never   Tobacco comments:    quit in 2000  Vaping Use   Vaping Use: Never used  Substance Use Topics   Alcohol use: No    Alcohol/week: 0.0 standard drinks   Drug use: No    Home Medications Prior to Admission medications   Medication Sig Start Date End Date Taking? Authorizing Provider  albuterol (PROVENTIL) (2.5 MG/3ML) 0.083% nebulizer solution Take 3 mLs (2.5 mg total) by nebulization every 6 (six) hours as needed for wheezing or shortness of breath. 12/14/20   Tanda Rockers, MD  albuterol (VENTOLIN HFA) 108 (90 Base) MCG/ACT inhaler Inhale 1-2 puffs into the lungs every 6 (six) hours as needed for wheezing or shortness of breath. 09/02/20   Lindell Spar, MD  ALPRAZolam Duanne Moron) 1 MG tablet Take 1 tablet (1 mg total) by mouth every 8 (eight) hours as needed for anxiety. 10/06/20   Lindell Spar, MD  amLODipine (NORVASC) 5 MG tablet TAKE 1 TABLET BY MOUTH DAILY. 09/23/20   Lindell Spar, MD  Fluticasone-Umeclidin-Vilant (TRELEGY ELLIPTA) 100-62.5-25 MCG/INH AEPB Inhale 1 puff into the lungs daily as needed (respiratory issues). 12/25/18   Sinda Du, MD  ibuprofen (ADVIL) 200 MG tablet Take 400 mg by mouth every 8 (eight) hours as needed (pain).    [provider]  levothyroxine (SYNTHROID) 50 MCG tablet Take 1 tablet (50 mcg total) by mouth daily. Patient taking differently: Take 75 mcg by mouth daily. 11/19/20   Cassandria Anger, MD  oxyCODONE (ROXICODONE) 15 MG immediate release tablet TAKE (1) TABLET BY MOUTH EVERY 8 HOURS AS NEEDED. 11/12/20   Lindell Spar, MD  oxyCODONE (ROXICODONE) 15 MG immediate release tablet Take 1 tablet (15 mg total) by mouth every 8 (eight) hours as needed for pain. 12/20/20   Fayrene Helper, MD  pantoprazole (PROTONIX) 40 MG tablet TAKE 1 TABLET BY MOUTH DAILY. Patient taking differently: Take 40 mg by mouth daily. 03/23/20   Mahala Menghini, PA-C   predniSONE (DELTASONE) 10 MG tablet Take  4 each am x 2 days,   2 each am x 2 days,  1 each am x 2 days and stop 12/16/20   Tanda Rockers, MD  Vitamin D, Ergocalciferol, (DRISDOL) 1.25 MG (50000 UNIT) CAPS capsule Take 50,000 Units by mouth every Sunday.    [provider]    Allergies    Penicillins and Vicodin [hydrocodone-acetaminophen]  Review of Systems   Review of Systems  Constitutional:  Negative for chills, fatigue and fever.  HENT:  Negative for congestion, sore throat and trouble swallowing.   Eyes:  Negative for visual disturbance.  Respiratory:  Positive  for cough (Intermittent cough with blood-tinged sputum). Negative for shortness of breath and wheezing.   Cardiovascular:  Negative for chest pain and palpitations.  Gastrointestinal:  Negative for abdominal pain, nausea and vomiting.  Genitourinary:  Negative for dysuria, flank pain and hematuria.  Musculoskeletal:  Negative for arthralgias, back pain, myalgias, neck pain and neck stiffness.  Skin:  Negative for rash.  Neurological:  Negative for dizziness, weakness, numbness and headaches.  Hematological:  Does not bruise/bleed easily.   Physical Exam Updated Vital Signs BP (!) 139/107   Pulse 93   Temp 98.4 F (36.9 C) (Oral)   Resp 14   Ht 5\' 5"  (1.651 m)   Wt 46.5 kg   SpO2 100%   BMI 17.07 kg/m   Physical Exam Vitals and nursing note reviewed.  Constitutional:      General: She is not in acute distress.    Appearance: Normal appearance. She is not ill-appearing.  HENT:     Mouth/Throat:     Mouth: Mucous membranes are moist.  Neck:     Vascular: No carotid bruit.  Cardiovascular:     Rate and Rhythm: Normal rate and regular rhythm.     Pulses: Normal pulses.  Pulmonary:     Effort: Pulmonary effort is normal. No respiratory distress.     Breath sounds: No wheezing, rhonchi or rales.  Abdominal:     Palpations: Abdomen is soft.     Tenderness: There is no abdominal tenderness.   Musculoskeletal:     Cervical back: Normal range of motion. No tenderness.     Right lower leg: No edema.     Left lower leg: No edema.  Lymphadenopathy:     Cervical: No cervical adenopathy.  Skin:    General: Skin is warm.     Capillary Refill: Capillary refill takes less than 2 seconds.     Findings: No rash.  Neurological:     General: No focal deficit present.     Mental Status: She is alert.     Sensory: No sensory deficit.     Motor: No weakness.    ED Results / Procedures / Treatments   Labs (all labs ordered are listed, but only abnormal results are displayed) Labs Reviewed  BASIC METABOLIC PANEL - Abnormal; Notable for the following components:      Result Value   Glucose, Bld 120 (*)    Calcium 8.6 (*)    All other components within normal limits  CBC WITH DIFFERENTIAL/PLATELET - Abnormal; Notable for the following components:   Hemoglobin 10.7 (*)    MCH 25.7 (*)    MCHC 29.7 (*)    All other components within normal limits    EKG None  Radiology DG Chest 2 View  Result Date: 12/29/2020 CLINICAL DATA:  Hemoptysis. EXAM: CHEST - 2 VIEW COMPARISON:  For 18 2022 FINDINGS: COPD with hyperinflation. Apical pleural and parenchymal scarring right greater than left again noted and unchanged. No superimposed infiltrate, edema, or effusion. IMPRESSION: COPD with apical scarring.  No superimposed acute abnormality. Electronically Signed   By: Franchot Gallo M.D.   On: 12/29/2020 14:11    Procedures Procedures   Medications Ordered in ED Medications - No data to display  ED Course  I have reviewed the triage vital signs and the nursing notes.  Pertinent labs & imaging results that were available during my care of the patient were reviewed by me and considered in my medical decision making (see chart for details).  MDM Rules/Calculators/A&P                           Patient here with concerns for possible hemoptysis.  She is on 2 L of oxygen by nasal cannula  at baseline.  No shortness of breath.  Reports 2 episodes of blood-tinged sputum earlier today.  None this evening.  Some generalized weakness, but denies chest pain.  History of COPD requesting steroids stating that this usually helps her breathing.  She is followed by pulmonology.  On exam, patient well-appearing.  Vitals reviewed.  She is hypertensive, but afebrile without tachycardia, tachypnea or hypoxia.  Ambulates without respiratory distress.  Chest x-ray shows COPD with apical scarring.  Reported blood-tinged sputum likely secondary to excessive coughing and reports some wheezing.  No wheezing appreciated on my exam.  I feel patient is appropriate for discharge home, have discussed using her albuterol every 4-6 hours as needed.  Will provide short course of steroids and she is requesting an antitussive as well.  With no acute findings on x-ray, reassuring labs and afebrile do not feel that antibiotic is indicated at this time.  She is agreeable to close outpatient follow-up and return precautions were discussed.   Final Clinical Impression(s) / ED Diagnoses Final diagnoses:  Bronchitis  Hypertension, unspecified type    Rx / DC Orders ED Discharge Orders     None        Kem Parkinson, PA-C 01/02/21 2304    Milton Ferguson, MD 01/09/21 1004

## 2020-12-30 DIAGNOSIS — J3801 Paralysis of vocal cords and larynx, unilateral: Secondary | ICD-10-CM | POA: Diagnosis not present

## 2020-12-30 DIAGNOSIS — C73 Malignant neoplasm of thyroid gland: Secondary | ICD-10-CM | POA: Diagnosis not present

## 2021-01-03 ENCOUNTER — Telehealth: Payer: Self-pay | Admitting: Internal Medicine

## 2021-01-03 NOTE — Telephone Encounter (Signed)
This is likely from the combination and recent thyroid surgery so rec d/c trelegy and use the neb qid x one week to see if improves and if not will need to See ENT for sensation of mucus stuck in throat   Abx not indicated at this time

## 2021-01-03 NOTE — Telephone Encounter (Signed)
Spoke with the pt and notified of recs per Dr [name]  She verbalized understanding  Nothing further needed

## 2021-01-03 NOTE — Telephone Encounter (Signed)
Pt stated that she is coughing up white mucus and stated that sometimes it just "hangs in her throat" and unable for her to get it up. Pt is requesting an antibiotic.   Pharmacy; [facility], [place] [address]; [phone]

## 2021-01-03 NOTE — Telephone Encounter (Signed)
Called and spoke with patient who states that she is coughing up white mucus and stated that sometimes it just "hangs in her throat" and unable for her to get it up. Pt is requesting an antibiotic. States that it has been going on since last visit with Dr. [name] and since her surgery where they took her thyroid out.    Pharmacy; [place], [place]

## 2021-01-06 ENCOUNTER — Encounter: Payer: Self-pay | Admitting: Internal Medicine

## 2021-01-06 ENCOUNTER — Other Ambulatory Visit: Payer: Self-pay

## 2021-01-06 ENCOUNTER — Ambulatory Visit (INDEPENDENT_AMBULATORY_CARE_PROVIDER_SITE_OTHER): Payer: Medicare Other | Admitting: Internal Medicine

## 2021-01-06 VITALS — BP 125/73 | HR 90 | Temp 98.0°F | Resp 20 | Ht 65.0 in | Wt 100.1 lb

## 2021-01-06 DIAGNOSIS — J449 Chronic obstructive pulmonary disease, unspecified: Secondary | ICD-10-CM

## 2021-01-06 DIAGNOSIS — E039 Hypothyroidism, unspecified: Secondary | ICD-10-CM

## 2021-01-06 DIAGNOSIS — J9612 Chronic respiratory failure with hypercapnia: Secondary | ICD-10-CM

## 2021-01-06 DIAGNOSIS — E890 Postprocedural hypothyroidism: Secondary | ICD-10-CM

## 2021-01-06 DIAGNOSIS — J9611 Chronic respiratory failure with hypoxia: Secondary | ICD-10-CM

## 2021-01-06 DIAGNOSIS — G8929 Other chronic pain: Secondary | ICD-10-CM | POA: Diagnosis not present

## 2021-01-06 MED ORDER — LEVOTHYROXINE SODIUM 75 MCG PO TABS: 75.0000 ug | ORAL_TABLET | Freq: Every day | ORAL | 2 refills | Status: DC

## 2021-01-06 MED ORDER — PROMETHAZINE-DM 6.25-15 MG/5ML PO SYRP: 5.0000 mL | ORAL_SOLUTION | Freq: Four times a day (QID) | ORAL | 0 refills | Status: AC | PRN

## 2021-01-06 MED ORDER — PREDNISONE 10 MG (21) PO TBPK: ORAL_TABLET | ORAL | 0 refills | Status: DC

## 2021-01-06 MED ORDER — AZITHROMYCIN 250 MG PO TABS: ORAL_TABLET | ORAL | 0 refills | Status: AC

## 2021-01-06 NOTE — Patient Instructions (Signed)
Please start taking Azithromycin and Prednisone as prescribed for COPD.  Continue to take other medications as prescribed.  Please maintain follow up with Dr Dorris Fetch and Dr Redmond Baseman.

## 2021-01-07 ENCOUNTER — Other Ambulatory Visit (HOSPITAL_COMMUNITY)
Admission: RE | Admit: 2021-01-07 | Discharge: 2021-01-07 | Disposition: A | Discharge status: Home / Self Care | Payer: Medicare Other | Source: Ambulatory Visit | Attending: Internal Medicine | Admitting: Internal Medicine

## 2021-01-07 DIAGNOSIS — Z01812 Encounter for preprocedural laboratory examination: Secondary | ICD-10-CM | POA: Diagnosis not present

## 2021-01-07 DIAGNOSIS — Z20822 Contact with and (suspected) exposure to covid-19: Secondary | ICD-10-CM | POA: Diagnosis not present

## 2021-01-07 LAB — SARS CORONAVIRUS 2 (TAT 6-24 HRS): SARS Coronavirus 2: NEGATIVE

## 2021-01-09 NOTE — Assessment & Plan Note (Signed)
On 2 l O2 at home Due to COPD Follows up with Dr [name]  Has been having worsening of cough and dyspnea recently - prescribed Azithromycin and steroid taper

## 2021-01-09 NOTE — Assessment & Plan Note (Signed)
Lab Results  Component Value Date   TSH 1.090 11/18/2020   Takes Levothyroxine 75 mcg QD S/p thyroidectomy - pathology showed malignancy at the margin, may need revision surgery - f/u ENT Last Endocrinology note reviewed - refill of Levothyroxine sent for now

## 2021-01-09 NOTE — Assessment & Plan Note (Signed)
From back pain and arthritis Continue Oxycodone as need for severe pain

## 2021-01-09 NOTE — Assessment & Plan Note (Signed)
With current acute exacerbation - prescribed Azithromycin and PRN steroid On Trelegy Albuterol PRN Uses home O2, 2 lpm Follows up with Dr [name]

## 2021-01-09 NOTE — Progress Notes (Signed)
Established Patient Office Visit  Subjective:  Patient ID: Marilyn Solomon, female    DOB: 01-15-1946  Age: 75 y.o. MRN: [id number]  CC:  Chief Complaint  Patient presents with   Follow-up    4 month follow up pt has had a little bit of congestion for about a week it comes and goes    HPI Marilyn Solomon is a 74 year old female with PMH of COPD, chronic hypoxic respiratory failure on home O2, HTN, anxiety, hypothyroidism, chronic back and hip pain and anemia who presents for follow up of her chronic medical conditions.  COPD: Has been using Trelegy regularly. Has been having mild dyspnea and wheezing. She also reports worsening of cough with whitish to mucoid expectoration. She is on 2 l O2.   HTN: BP now well-controlled with Amlodipine. She denies any headache, dizziness, chest pain, dyspnea or palpitations.  Multinodular goiter: She had thyroidectomy for possible malignancy. She is planned to have a visit with ENT specialist at [facility] for possible need for revision surgery as margins were not clear in the pathology report. She takes Levothyroxine 75 mcg (1.5 tablet of 50 mcg) and asks for a refill of 75 mcg instead of 50 mcg. Her pain in the throat has improved now. Denies any dysphagia or dysphonia.  Past Medical History:  Diagnosis Date   Anemia    Anxiety    Arthritis    C. difficile diarrhea 10/08/2019   Chronic back pain    COPD (chronic obstructive pulmonary disease) (HCC)    GERD (gastroesophageal reflux disease)    HCAP (healthcare-associated pneumonia) 06/28/2017   Hypertension    Hypothyroidism    On home oxygen therapy    2L   Pneumonia due to COVID-19 virus 09/28/2019   Reflux    no medications currently, asymptomatic   Thyroid disease     Past Surgical History:  Procedure Laterality Date   ABDOMINAL HYSTERECTOMY     BIOPSY  10/08/2017   Procedure: BIOPSY;  Surgeon: Daneil Dolin, MD;  Location: [facility];  Service: Endoscopy;;  gastric    cataracts Bilateral    COLONOSCOPY  2007   Dr. Gala Romney: normal rectum, diverticula    COLONOSCOPY WITH PROPOFOL N/A 11/15/2015   Dr. Gala Romney: grade II hemorrhoids, diverticulosis   COLONOSCOPY WITH PROPOFOL N/A 10/08/2017   Rourk: Diverticulosis   ESOPHAGOGASTRODUODENOSCOPY (EGD) WITH PROPOFOL N/A 10/08/2017   Rourk: Nonobstructing Schatzki ring, mild erosive reflux esophagitis, small hiatal hernia, gastritis but no H. pylori.   FRACTURE SURGERY Right 2005   Wrist   JOINT REPLACEMENT Left 2000   hip   RADICAL NECK DISSECTION Right 09/13/2020   Procedure: RADICAL NECK DISSECTION;  Surgeon: Melida Quitter, MD;  Location: [facility] OR;  Service: ENT;  Laterality: Right;   THYROIDECTOMY N/A 09/13/2020   Procedure: TOTAL THYROIDECTOMY;  Surgeon: Melida Quitter, MD;  Location: [facility] OR;  Service: ENT;  Laterality: N/A;   TOTAL HIP ARTHROPLASTY Left    WRIST SURGERY Right     Family History  Problem Relation Age of Onset   Bone cancer Sister    Pancreatic cancer Sister    Pneumonia Mother    Heart attack Father    Diabetes Sister    Dementia Sister    Colon cancer Neg Hx     Social History   Socioeconomic History   Marital status: Widowed    Spouse name: Not on file   Number of children: Not on file   Years of education: Not  on file   Highest education level: Not on file  Occupational History   Occupation: disability  Tobacco Use   Smoking status: Former    Packs/day: 25.00    Years: 1.00    Pack years: 25.00    Types: Cigarettes    Quit date: 08/27/1998    Years since quitting: 22.3   Smokeless tobacco: Never   Tobacco comments:    quit in 2000  Vaping Use   Vaping Use: Never used  Substance and Sexual Activity   Alcohol use: No    Alcohol/week: 0.0 standard drinks   Drug use: No   Sexual activity: Yes    Birth control/protection: Surgical  Other Topics Concern   Not on file  Social History Narrative   Not on file   Social Determinants of Health   Financial Resource Strain:  Low Risk    Difficulty of Paying Living Expenses: Not hard at all  Food Insecurity: No Food Insecurity   Worried About Charity fundraiser in the Last Year: Never true   [place] in the Last Year: Never true  Transportation Needs: No Transportation Needs   Lack of Transportation (Medical): No   Lack of Transportation (Non-Medical): No  Physical Activity: Insufficiently Active   Days of Exercise per Week: 7 days   Minutes of Exercise per Session: 10 min  Stress: No Stress Concern Present   Feeling of Stress : Not at all  Social Connections: Socially Isolated   Frequency of Communication with Friends and Family: More than three times a week   Frequency of Social Gatherings with Friends and Family: Once a week   Attends Religious Services: Never   Marine scientist or Organizations: No   Attends Archivist Meetings: Never   Marital Status: Widowed  Human resources officer Violence: Not At Risk   Fear of Current or Ex-Partner: No   Emotionally Abused: No   Physically Abused: No   Sexually Abused: No    Outpatient Medications Prior to Visit  Medication Sig Dispense Refill   albuterol (PROVENTIL) (2.5 MG/3ML) 0.083% nebulizer solution Take 3 mLs (2.5 mg total) by nebulization every 6 (six) hours as needed for wheezing or shortness of breath. 120 mL 2   albuterol (VENTOLIN HFA) 108 (90 Base) MCG/ACT inhaler Inhale 1-2 puffs into the lungs every 6 (six) hours as needed for wheezing or shortness of breath. 8 g 5   ALPRAZolam (XANAX) 1 MG tablet Take 1 tablet (1 mg total) by mouth every 8 (eight) hours as needed for anxiety. 90 tablet 2   amLODipine (NORVASC) 5 MG tablet TAKE 1 TABLET BY MOUTH DAILY. 90 tablet 0   benzonatate (TESSALON) 200 MG capsule Take 1 capsule (200 mg total) by mouth every 8 (eight) hours. 21 capsule 0   Fluticasone-Umeclidin-Vilant (TRELEGY ELLIPTA) 100-62.5-25 MCG/INH AEPB Inhale 1 puff into the lungs daily as needed (respiratory issues).     ibuprofen  (ADVIL) 200 MG tablet Take 400 mg by mouth every 8 (eight) hours as needed (pain).     oxyCODONE (ROXICODONE) 15 MG immediate release tablet TAKE (1) TABLET BY MOUTH EVERY 8 HOURS AS NEEDED. 90 tablet 0   pantoprazole (PROTONIX) 40 MG tablet TAKE 1 TABLET BY MOUTH DAILY. (Patient taking differently: Take 40 mg by mouth daily.) 90 tablet 3   Vitamin D, Ergocalciferol, (DRISDOL) 1.25 MG (50000 UNIT) CAPS capsule Take 50,000 Units by mouth every Sunday.     levothyroxine (SYNTHROID) 50 MCG tablet Take  1 tablet (50 mcg total) by mouth daily. (Patient taking differently: Take 75 mcg by mouth daily.) 30 tablet 3   oxyCODONE (ROXICODONE) 15 MG immediate release tablet Take 1 tablet (15 mg total) by mouth every 8 (eight) hours as needed for pain. (Patient not taking: Reported on 01/06/2021) 90 tablet 0   predniSONE (DELTASONE) 10 MG tablet Take 3 tablets po qd x 2 days, then 2 tablets po qd x 2 days, then 1 tablet po qd x 2 days (Patient not taking: Reported on 01/06/2021) 12 tablet 0   No facility-administered medications prior to visit.    Allergies  Allergen Reactions   Penicillins Itching     Tolerated Ancef Intraop 09/13/20   Vicodin [Hydrocodone-Acetaminophen] Nausea And Vomiting    ROS Review of Systems  Constitutional:  Negative for chills and fever.  HENT:  Positive for hearing loss and voice change. Negative for congestion, ear pain, sinus pressure, sinus pain and sore throat.   Eyes:  Negative for pain and discharge.  Respiratory:  Positive for cough, shortness of breath and wheezing.   Cardiovascular:  Negative for chest pain and palpitations.  Gastrointestinal:  Negative for abdominal pain, constipation, diarrhea, nausea and vomiting.  Endocrine: Negative for polydipsia and polyuria.  Genitourinary:  Negative for dysuria and hematuria.  Musculoskeletal:  Negative for neck pain and neck stiffness.  Skin:  Negative for rash.  Neurological:  Negative for dizziness and weakness.   Psychiatric/Behavioral:  Negative for agitation, behavioral problems, dysphoric mood and suicidal ideas. The patient is nervous/anxious. The patient is not hyperactive.      Objective:    Physical Exam Vitals reviewed.  Constitutional:      General: She is not in acute distress.    Appearance: She is not diaphoretic.  HENT:     Head: Normocephalic and atraumatic.     Nose: Nose normal.     Mouth/Throat:     Mouth: Mucous membranes are moist.  Eyes:     General: No scleral icterus.    Extraocular Movements: Extraocular movements intact.     Pupils: Pupils are equal, round, and reactive to light.  Neck:     Comments: Thyroidectomy scar C/D/I, mild erythema noted Cardiovascular:     Rate and Rhythm: Normal rate and regular rhythm.     Pulses: Normal pulses.     Heart sounds: Normal heart sounds. No murmur heard. Pulmonary:     Breath sounds: Normal breath sounds. No wheezing or rales.     Comments: On 2 lpm O2 Musculoskeletal:     Cervical back: No tenderness.     Right lower leg: No edema.     Left lower leg: No edema.  Skin:    General: Skin is warm.     Findings: No rash.  Neurological:     General: No focal deficit present.     Mental Status: She is alert and oriented to person, place, and time.     Sensory: No sensory deficit.     Motor: No weakness.  Psychiatric:        Mood and Affect: Mood normal.        Behavior: Behavior normal.    BP 125/73 (BP Location: Right Arm, Patient Position: Sitting, Cuff Size: Normal)   Pulse 90   Temp 98 F (36.7 C) (Oral)   Resp 20   Ht 5\' 5"  (1.651 m)   Wt 100 lb 1.9 oz (45.4 kg)   SpO2 98%   BMI 16.66 kg/m  Wt Readings from Last 3 Encounters:  01/06/21 100 lb 1.9 oz (45.4 kg)  12/29/20 102 lb 9.6 oz (46.5 kg)  12/16/20 102 lb (46.3 kg)     Health Maintenance Due  Topic Date Due   Zoster Vaccines- Shingrix (1 of 2) Never done   PNA vac Low Risk Adult (2 of 2 - PCV13) 06/29/2018   COVID-19 Vaccine (3 - Moderna  risk series) 12/25/2019   INFLUENZA VACCINE  12/27/2020    There are no preventive care reminders to display for this patient.  Lab Results  Component Value Date   TSH 1.090 11/18/2020   Lab Results  Component Value Date   WBC 8.2 12/29/2020   HGB 10.7 (L) 12/29/2020   HCT 36.0 12/29/2020   MCV 86.5 12/29/2020   PLT 243 12/29/2020   Lab Results  Component Value Date   NA 138 12/29/2020   K 4.0 12/29/2020   CO2 29 12/29/2020   GLUCOSE 120 (H) 12/29/2020   BUN 13 12/29/2020   CREATININE 0.67 12/29/2020   BILITOT 0.7 09/14/2020   ALKPHOS 54 09/14/2020   AST 22 09/14/2020   ALT 11 09/14/2020   PROT 5.8 (L) 09/14/2020   ALBUMIN 2.7 (L) 09/14/2020   CALCIUM 8.6 (L) 12/29/2020   ANIONGAP 9 12/29/2020   Lab Results  Component Value Date   CHOL 190 06/03/2020   Lab Results  Component Value Date   HDL 41 06/03/2020   Lab Results  Component Value Date   LDLCALC 121 (H) 06/03/2020   Lab Results  Component Value Date   TRIG 464 (H) 09/14/2020   Lab Results  Component Value Date   CHOLHDL 4.6 (H) 06/03/2020   Lab Results  Component Value Date   HGBA1C 6.0 (H) 06/03/2020      Assessment & Plan:   Problem List Items Addressed This Visit       Respiratory   COPD GOLD  ?     With current acute exacerbation - prescribed Azithromycin and PRN steroid On Trelegy Albuterol PRN Uses home O2, 2 lpm Follows up with Dr [name]      Relevant Medications   azithromycin (ZITHROMAX) 250 MG tablet   predniSONE (STERAPRED UNI-PAK 21 TAB) 10 MG (21) TBPK tablet   promethazine-dextromethorphan (PROMETHAZINE-DM) 6.25-15 MG/5ML syrup   Chronic respiratory failure with hypoxia and hypercapnia (HCC)    On 2 l O2 at home Due to COPD Follows up with Dr [name]  Has been having worsening of cough and dyspnea recently - prescribed Azithromycin and steroid taper      Relevant Medications   azithromycin (ZITHROMAX) 250 MG tablet   predniSONE (STERAPRED UNI-PAK 21 TAB) 10 MG (21)  TBPK tablet     Endocrine   Hypothyroidism - Primary (Chronic)    Lab Results  Component Value Date   TSH 1.090 11/18/2020  Takes Levothyroxine 75 mcg QD S/p thyroidectomy - pathology showed malignancy at the margin, may need revision surgery - f/u ENT Last Endocrinology note reviewed - refill of Levothyroxine sent for now       Relevant Medications   levothyroxine (SYNTHROID) 75 MCG tablet     Other   Other chronic pain    From back pain and arthritis Continue Oxycodone as need for severe pain      Relevant Medications   predniSONE (STERAPRED UNI-PAK 21 TAB) 10 MG (21) TBPK tablet    Meds ordered this encounter  Medications   levothyroxine (SYNTHROID) 75 MCG tablet    Sig:  Take 1 tablet (75 mcg total) by mouth daily.    Dispense:  30 tablet    Refill:  2   azithromycin (ZITHROMAX) 250 MG tablet    Sig: Take 2 tablets on day 1, then 1 tablet daily on days 2 through 5    Dispense:  6 tablet    Refill:  0   predniSONE (STERAPRED UNI-PAK 21 TAB) 10 MG (21) TBPK tablet    Sig: Take as package instructions.    Dispense:  1 each    Refill:  0   promethazine-dextromethorphan (PROMETHAZINE-DM) 6.25-15 MG/5ML syrup    Sig: Take 5 mLs by mouth 4 (four) times daily as needed for cough.    Dispense:  118 mL    Refill:  0    Follow-up: Return in about 4 months (around 05/08/2021) for Hypothyroidism and COPD.    Lindell Spar, MD

## 2021-01-11 ENCOUNTER — Encounter: Payer: Self-pay | Admitting: *Deleted

## 2021-01-11 ENCOUNTER — Ambulatory Visit (HOSPITAL_COMMUNITY)
Admission: RE | Admit: 2021-01-11 | Discharge: 2021-01-11 | Disposition: A | Discharge status: Home / Self Care | Payer: Medicare Other | Source: Ambulatory Visit | Attending: Internal Medicine | Admitting: Internal Medicine

## 2021-01-11 ENCOUNTER — Other Ambulatory Visit: Payer: Self-pay

## 2021-01-11 DIAGNOSIS — J449 Chronic obstructive pulmonary disease, unspecified: Secondary | ICD-10-CM | POA: Insufficient documentation

## 2021-01-11 LAB — PULMONARY FUNCTION TEST
DL/VA % pred: 66 %
DL/VA: 2.7 ml/min/mmHg/L
DLCO cor % pred: 34 %
DLCO cor: 6.84 ml/min/mmHg
DLCO unc % pred: 31 %
DLCO unc: 6.2 ml/min/mmHg
FEF 25-75 Post: 0.27 L/sec
FEF 25-75 Pre: 0.25 L/sec
FEF2575-%Change-Post: 6 %
FEF2575-%Pred-Post: 15 %
FEF2575-%Pred-Pre: 14 %
FEV1-%Change-Post: 6 %
FEV1-%Pred-Post: 29 %
FEV1-%Pred-Pre: 28 %
FEV1-Post: 0.67 L
FEV1-Pre: 0.63 L
FEV1FVC-%Change-Post: -10 %
FEV1FVC-%Pred-Pre: 53 %
FEV6-%Change-Post: 5 %
FEV6-%Pred-Post: 53 %
FEV6-%Pred-Pre: 50 %
FEV6-Post: 1.51 L
FEV6-Pre: 1.43 L
FEV6FVC-%Change-Post: -12 %
FEV6FVC-%Pred-Post: 85 %
FEV6FVC-%Pred-Pre: 98 %
FVC-%Change-Post: 19 %
FVC-%Pred-Post: 62 %
FVC-%Pred-Pre: 52 %
FVC-Post: 1.86 L
FVC-Pre: 1.55 L
Post FEV1/FVC ratio: 36 %
Post FEV6/FVC ratio: 81 %
Pre FEV1/FVC ratio: 40 %
Pre FEV6/FVC Ratio: 93 %
RV % pred: 148 %
RV: 3.45 L
TLC % pred: 99 %
TLC: 5.2 L

## 2021-01-11 MED ORDER — ALBUTEROL SULFATE (2.5 MG/3ML) 0.083% IN NEBU
2.5000 mg | INHALATION_SOLUTION | Freq: Once | RESPIRATORY_TRACT | Status: AC
  Administered 2021-01-11: 2.5 mg via RESPIRATORY_TRACT

## 2021-01-17 ENCOUNTER — Telehealth: Payer: Self-pay

## 2021-01-17 ENCOUNTER — Other Ambulatory Visit: Payer: Self-pay | Admitting: Internal Medicine

## 2021-01-17 DIAGNOSIS — G8929 Other chronic pain: Secondary | ICD-10-CM

## 2021-01-17 DIAGNOSIS — F419 Anxiety disorder, unspecified: Secondary | ICD-10-CM

## 2021-01-17 MED ORDER — ALPRAZOLAM 1 MG PO TABS: 1.0000 mg | ORAL_TABLET | Freq: Three times a day (TID) | ORAL | 2 refills | Status: AC | PRN

## 2021-01-17 MED ORDER — OXYCODONE HCL 15 MG PO TABS: ORAL_TABLET | ORAL | 0 refills | Status: DC

## 2021-01-17 NOTE — Progress Notes (Signed)
Spoke with pt and notified of results per Dr. Wert. Pt verbalized understanding and denied any questions. 

## 2021-01-17 NOTE — Telephone Encounter (Signed)
Patient called need med refills  ALPRAZolam (XANAX) 1 MG tablet   oxyCODONE (ROXICODONE) 15 MG immediate release tablet   Pharmacy: [facility]

## 2021-01-18 ENCOUNTER — Emergency Department (HOSPITAL_COMMUNITY): Payer: Medicare Other

## 2021-01-18 ENCOUNTER — Encounter (HOSPITAL_COMMUNITY): Payer: Self-pay

## 2021-01-18 ENCOUNTER — Emergency Department (HOSPITAL_COMMUNITY)
Admission: EM | Admit: 2021-01-18 | Discharge: 2021-01-18 | Disposition: A | Discharge status: Home / Self Care | Payer: Medicare Other | Attending: Emergency Medicine | Admitting: Emergency Medicine

## 2021-01-18 ENCOUNTER — Other Ambulatory Visit: Payer: Self-pay

## 2021-01-18 DIAGNOSIS — J441 Chronic obstructive pulmonary disease with (acute) exacerbation: Secondary | ICD-10-CM | POA: Insufficient documentation

## 2021-01-18 DIAGNOSIS — Z8616 Personal history of COVID-19: Secondary | ICD-10-CM | POA: Diagnosis not present

## 2021-01-18 DIAGNOSIS — Z96642 Presence of left artificial hip joint: Secondary | ICD-10-CM | POA: Diagnosis not present

## 2021-01-18 DIAGNOSIS — R000 Tachycardia, unspecified: Secondary | ICD-10-CM | POA: Diagnosis not present

## 2021-01-18 DIAGNOSIS — Z20822 Contact with and (suspected) exposure to covid-19: Secondary | ICD-10-CM | POA: Insufficient documentation

## 2021-01-18 DIAGNOSIS — Z85850 Personal history of malignant neoplasm of thyroid: Secondary | ICD-10-CM | POA: Diagnosis not present

## 2021-01-18 DIAGNOSIS — Z79899 Other long term (current) drug therapy: Secondary | ICD-10-CM | POA: Diagnosis not present

## 2021-01-18 DIAGNOSIS — N189 Chronic kidney disease, unspecified: Secondary | ICD-10-CM | POA: Diagnosis not present

## 2021-01-18 DIAGNOSIS — I129 Hypertensive chronic kidney disease with stage 1 through stage 4 chronic kidney disease, or unspecified chronic kidney disease: Secondary | ICD-10-CM | POA: Insufficient documentation

## 2021-01-18 DIAGNOSIS — Z87891 Personal history of nicotine dependence: Secondary | ICD-10-CM | POA: Insufficient documentation

## 2021-01-18 DIAGNOSIS — E039 Hypothyroidism, unspecified: Secondary | ICD-10-CM | POA: Insufficient documentation

## 2021-01-18 DIAGNOSIS — R0602 Shortness of breath: Secondary | ICD-10-CM | POA: Diagnosis not present

## 2021-01-18 LAB — RESP PANEL BY RT-PCR (FLU A&B, COVID) ARPGX2
Influenza A by PCR: NEGATIVE
Influenza B by PCR: NEGATIVE
SARS Coronavirus 2 by RT PCR: NEGATIVE

## 2021-01-18 LAB — CBC WITH DIFFERENTIAL/PLATELET
Abs Immature Granulocytes: 0.03 10*3/uL (ref 0.00–0.07)
Basophils Absolute: 0 10*3/uL (ref 0.0–0.1)
Basophils Relative: 0 %
Eosinophils Absolute: 0.1 10*3/uL (ref 0.0–0.5)
Eosinophils Relative: 1 %
HCT: 38.9 % (ref 36.0–46.0)
Hemoglobin: 11.5 g/dL — ABNORMAL LOW (ref 12.0–15.0)
Immature Granulocytes: 0 %
Lymphocytes Relative: 11 %
Lymphs Abs: 0.9 10*3/uL (ref 0.7–4.0)
MCH: 26.3 pg (ref 26.0–34.0)
MCHC: 29.6 g/dL — ABNORMAL LOW (ref 30.0–36.0)
MCV: 89 fL (ref 80.0–100.0)
Monocytes Absolute: 0.6 10*3/uL (ref 0.1–1.0)
Monocytes Relative: 8 %
Neutro Abs: 6.2 10*3/uL (ref 1.7–7.7)
Neutrophils Relative %: 80 %
Platelets: 228 10*3/uL (ref 150–400)
RBC: 4.37 MIL/uL (ref 3.87–5.11)
RDW: 14.2 % (ref 11.5–15.5)
WBC: 7.8 10*3/uL (ref 4.0–10.5)
nRBC: 0 % (ref 0.0–0.2)

## 2021-01-18 LAB — BASIC METABOLIC PANEL
Anion gap: 10 (ref 5–15)
BUN: 20 mg/dL (ref 8–23)
CO2: 35 mmol/L — ABNORMAL HIGH (ref 22–32)
Calcium: 8.7 mg/dL — ABNORMAL LOW (ref 8.9–10.3)
Chloride: 97 mmol/L — ABNORMAL LOW (ref 98–111)
Creatinine, Ser: 0.78 mg/dL (ref 0.44–1.00)
GFR, Estimated: >60 mL/min (ref >60)
Glucose, Bld: 139 mg/dL — ABNORMAL HIGH (ref 70–99)
Potassium: 3.8 mmol/L (ref 3.5–5.1)
Sodium: 142 mmol/L (ref 135–145)

## 2021-01-18 MED ORDER — GUAIFENESIN 100 MG/5ML PO LIQD: 100.0000 mg | ORAL | 0 refills | Status: DC | PRN

## 2021-01-18 MED ORDER — GUAIFENESIN 100 MG/5ML PO SOLN
10.0000 mL | Freq: Once | ORAL | Status: AC
  Administered 2021-01-18: 200 mg via ORAL
  Filled 2021-01-18: qty 5

## 2021-01-18 MED ORDER — LEVOFLOXACIN 750 MG PO TABS: 750.0000 mg | ORAL_TABLET | Freq: Every day | ORAL | 0 refills | Status: DC

## 2021-01-18 MED ORDER — AEROCHAMBER PLUS FLO-VU MEDIUM MISC
1.0000 | Freq: Once | Status: DC
  Filled 2021-01-18: qty 1

## 2021-01-18 MED ORDER — ALBUTEROL SULFATE HFA 108 (90 BASE) MCG/ACT IN AERS
4.0000 | INHALATION_SPRAY | Freq: Once | RESPIRATORY_TRACT | Status: AC
  Administered 2021-01-18: 4 via RESPIRATORY_TRACT
  Filled 2021-01-18: qty 6.7

## 2021-01-18 MED ORDER — PREDNISONE 50 MG PO TABS: ORAL_TABLET | ORAL | 0 refills | Status: DC

## 2021-01-18 MED ORDER — LEVOFLOXACIN 750 MG PO TABS
750.0000 mg | ORAL_TABLET | Freq: Once | ORAL | Status: AC
  Administered 2021-01-18: 750 mg via ORAL
  Filled 2021-01-18: qty 1

## 2021-01-18 MED ORDER — METHYLPREDNISOLONE SODIUM SUCC 125 MG IJ SOLR
125.0000 mg | Freq: Once | INTRAMUSCULAR | Status: AC
  Administered 2021-01-18: 125 mg via INTRAVENOUS
  Filled 2021-01-18: qty 2

## 2021-01-18 NOTE — Discharge Instructions (Addendum)
Take your next dose of the prednisone and antibiotic tomorrow evening with your evening meal.  You may also use the guaifenesin which may help your cough be more productive, this only helps however if you are drinking plenty of fluids to make sure you are doing this.  Continue your home nebulizer medications.  Get rechecked immediately for any worsening symptoms or if you develop fevers or any new complaints.

## 2021-01-18 NOTE — ED Provider Notes (Signed)
[facility] EMERGENCY DEPARTMENT Provider Note   CSN: [id number] Arrival date & time: 01/18/21  1451     History Chief Complaint  Patient presents with   Shortness of Breath    Marilyn Solomon is a 75 y.o. female with a history of anemia, anxiety, COPD, hypertension and prior history of COVID-19 pneumonia but who is completely COVID vaccinated including booster presenting for evaluation of increasing shortness of breath, wheezing and chest tightness along with a cough which has been productive of red-tinged sputum.  The blood-tinged sputum started upon arrival here.  She reports she has had both nasal and chest congestion for about 3 days, she has had no epistaxis, denies sinus pain, fevers or chills.  She is on chronic oxygen at 2 L, she has not had to increase her levels since this new illness began.  She sees Dr. [name] of pulmonology.  She states she has been on steroids within the last 30 days.  She denies chest pain, has had no swelling in her extremities.  Also denies nausea or vomiting, she does endorse a mild generalized headache.  She has used her albuterol nebulizer without relief of her symptoms, last dose was taken just prior to arrival.  She is also on Trelegy which she has not taken in the past several days.   The history is provided by the patient.      Past Medical History:  Diagnosis Date   Anemia    Anxiety    Arthritis    C. difficile diarrhea 10/08/2019   Chronic back pain    COPD (chronic obstructive pulmonary disease) (HCC)    GERD (gastroesophageal reflux disease)    HCAP (healthcare-associated pneumonia) 06/28/2017   Hypertension    Hypothyroidism    On home oxygen therapy    2L   Pneumonia due to COVID-19 virus 09/28/2019   Reflux    no medications currently, asymptomatic   Thyroid disease     Patient Active Problem List   Diagnosis Date Noted   Complete paralysis of right vocal cord 08/27/2020   Malignant neoplasm of thyroid gland ([place]) 07/08/2020    Other chronic pain 06/17/2020   Multinodular goiter 06/10/2020   Neck mass 06/03/2020   Hearing abnormally acute, right 06/03/2020   Chronic respiratory failure with hypoxia and hypercapnia ([place]) 05/11/2020   Irritable bowel syndrome with atypical cp  05/11/2020   Acute kidney injury superimposed on CKD ([place]) 01/23/2020   Hypertension 06/02/2018   Positive colorectal cancer screening using Cologuard test 08/21/2017   Normocytic anemia    Anxiety 06/30/2017   Hypothyroidism 06/30/2017   GERD (gastroesophageal reflux disease) 06/30/2017   COPD GOLD IV  06/30/2017   Elevated liver enzymes 06/30/2017   Severe Iron deficiency anemia 06/30/2017   Second degree hemorrhoids     Past Surgical History:  Procedure Laterality Date   ABDOMINAL HYSTERECTOMY     BIOPSY  10/08/2017   Procedure: BIOPSY;  Surgeon: Daneil Dolin, MD;  Location: [facility];  Service: Endoscopy;;  gastric   cataracts Bilateral    COLONOSCOPY  2007   Dr. Gala Romney: normal rectum, diverticula    COLONOSCOPY WITH PROPOFOL N/A 11/15/2015   Dr. Gala Romney: grade II hemorrhoids, diverticulosis   COLONOSCOPY WITH PROPOFOL N/A 10/08/2017   Rourk: Diverticulosis   ESOPHAGOGASTRODUODENOSCOPY (EGD) WITH PROPOFOL N/A 10/08/2017   Rourk: Nonobstructing Schatzki ring, mild erosive reflux esophagitis, small hiatal hernia, gastritis but no H. pylori.   FRACTURE SURGERY Right 2005   Wrist  JOINT REPLACEMENT Left 2000   hip   RADICAL NECK DISSECTION Right 09/13/2020   Procedure: RADICAL NECK DISSECTION;  Surgeon: Melida Quitter, MD;  Location: [place];  Service: ENT;  Laterality: Right;   THYROIDECTOMY N/A 09/13/2020   Procedure: TOTAL THYROIDECTOMY;  Surgeon: Melida Quitter, MD;  Location: [place];  Service: ENT;  Laterality: N/A;   TOTAL HIP ARTHROPLASTY Left    WRIST SURGERY Right      OB History   No obstetric history on file.     Family History  Problem Relation Age of Onset   Bone cancer Sister    Pancreatic cancer Sister     Pneumonia Mother    Heart attack Father    Diabetes Sister    Dementia Sister    Colon cancer Neg Hx     Social History   Tobacco Use   Smoking status: Former    Packs/day: 25.00    Years: 1.00    Pack years: 25.00    Types: Cigarettes    Quit date: 08/27/1998    Years since quitting: 22.4   Smokeless tobacco: Never   Tobacco comments:    quit in 2000  Vaping Use   Vaping Use: Never used  Substance Use Topics   Alcohol use: No    Alcohol/week: 0.0 standard drinks   Drug use: No    Home Medications Prior to Admission medications   Medication Sig Start Date End Date Taking? Authorizing Provider  albuterol (PROVENTIL) (2.5 MG/3ML) 0.083% nebulizer solution Take 3 mLs (2.5 mg total) by nebulization every 6 (six) hours as needed for wheezing or shortness of breath. 12/14/20  Yes Tanda Rockers, MD  albuterol (VENTOLIN HFA) 108 (90 Base) MCG/ACT inhaler Inhale 1-2 puffs into the lungs every 6 (six) hours as needed for wheezing or shortness of breath. 09/02/20  Yes Lindell Spar, MD  ALPRAZolam Duanne Moron) 1 MG tablet Take 1 tablet (1 mg total) by mouth every 8 (eight) hours as needed for anxiety. 01/17/21  Yes Lindell Spar, MD  amLODipine (NORVASC) 5 MG tablet TAKE 1 TABLET BY MOUTH DAILY. 09/23/20  Yes Lindell Spar, MD  Fluticasone-Umeclidin-Vilant (TRELEGY ELLIPTA) 100-62.5-25 MCG/INH AEPB Inhale 1 puff into the lungs daily as needed (respiratory issues). 12/25/18  Yes Sinda Du, MD  guaiFENesin (ROBITUSSIN) 100 MG/5ML liquid Take 5-10 mLs (100-200 mg total) by mouth every 4 (four) hours as needed for cough. 01/18/21  Yes Dashon Mcintire, Almyra Free, PA-C  levofloxacin (LEVAQUIN) 750 MG tablet Take 1 tablet (750 mg total) by mouth daily. 01/18/21  Yes Aiya Keach, Almyra Free, PA-C  levothyroxine (SYNTHROID) 75 MCG tablet Take 1 tablet (75 mcg total) by mouth daily. 01/06/21  Yes Patel, Colin Broach, MD  oxyCODONE (ROXICODONE) 15 MG immediate release tablet TAKE (1) TABLET BY MOUTH EVERY 8 HOURS AS NEEDED.  01/17/21  Yes Lindell Spar, MD  pantoprazole (PROTONIX) 40 MG tablet TAKE 1 TABLET BY MOUTH DAILY. Patient taking differently: Take 40 mg by mouth daily. 03/23/20  Yes Mahala Menghini, PA-C  predniSONE (DELTASONE) 50 MG tablet Take one tablet daily for 5 days 01/18/21  Yes Ayaan Shutes, Almyra Free, PA-C  benzonatate (TESSALON) 200 MG capsule Take 1 capsule (200 mg total) by mouth every 8 (eight) hours. Patient not taking: No sig reported 12/29/20   Triplett, Tammy, PA-C  ibuprofen (ADVIL) 200 MG tablet Take 400 mg by mouth every 8 (eight) hours as needed (pain). Patient not taking: Reported on 01/18/2021    [provider]  promethazine-dextromethorphan (PROMETHAZINE-DM)  6.25-15 MG/5ML syrup Take 5 mLs by mouth 4 (four) times daily as needed for cough. Patient not taking: No sig reported 01/06/21   Lindell Spar, MD  Vitamin D, Ergocalciferol, (DRISDOL) 1.25 MG (50000 UNIT) CAPS capsule Take 50,000 Units by mouth every Sunday.    [provider]    Allergies    Penicillins and Vicodin [hydrocodone-acetaminophen]  Review of Systems   Review of Systems  Constitutional:  Negative for chills and fever.  HENT:  Positive for congestion. Negative for sore throat.   Eyes: Negative.   Respiratory:  Positive for cough, chest tightness, shortness of breath and wheezing.        Hemoptysis  Cardiovascular:  Negative for chest pain.  Gastrointestinal:  Negative for abdominal pain, nausea and vomiting.  Genitourinary: Negative.   Musculoskeletal:  Negative for arthralgias, joint swelling and neck pain.  Skin: Negative.  Negative for rash and wound.  Neurological:  Positive for headaches. Negative for dizziness, weakness, light-headedness and numbness.  Psychiatric/Behavioral: Negative.     Physical Exam Updated Vital Signs BP 140/73   Pulse 96   Temp 97.9 F (36.6 C) (Oral)   Resp 19   Ht 5\' 5"  (1.651 m)   Wt 45.4 kg   SpO2 99%   BMI 16.66 kg/m   Physical Exam Vitals and nursing  note reviewed.  Constitutional:      Appearance: She is well-developed.  HENT:     Head: Normocephalic and atraumatic.  Eyes:     Conjunctiva/sclera: Conjunctivae normal.  Cardiovascular:     Rate and Rhythm: Normal rate and regular rhythm.     Heart sounds: Normal heart sounds.  Pulmonary:     Effort: Pulmonary effort is normal.     Breath sounds: Wheezing present.     Comments: Expiratory wheeze throughout all lung fields, prolonged expirations.  Patient presents with sputum soaked and blood-tinged paper towels. Abdominal:     General: Bowel sounds are normal.     Palpations: Abdomen is soft.     Tenderness: There is no abdominal tenderness.  Musculoskeletal:        General: Normal range of motion.     Cervical back: Normal range of motion.     Right lower leg: No edema.     Left lower leg: No edema.  Skin:    General: Skin is warm and dry.  Neurological:     Mental Status: She is alert.    ED Results / Procedures / Treatments   Labs (all labs ordered are listed, but only abnormal results are displayed) Labs Reviewed  BASIC METABOLIC PANEL - Abnormal; Notable for the following components:      Result Value   Chloride 97 (*)    CO2 35 (*)    Glucose, Bld 139 (*)    Calcium 8.7 (*)    All other components within normal limits  CBC WITH DIFFERENTIAL/PLATELET - Abnormal; Notable for the following components:   Hemoglobin 11.5 (*)    MCHC 29.6 (*)    All other components within normal limits  RESP PANEL BY RT-PCR (FLU A&B, COVID) ARPGX2    EKG None  Radiology DG Chest Port 1 View  Result Date: 01/18/2021 CLINICAL DATA:  Shortness of breath EXAM: PORTABLE CHEST 1 VIEW COMPARISON:  Multiple priors, most recent chest x-ray dated December 29, 2020 FINDINGS: Cardiac and mediastinal contours are unchanged. Bilateral right greater than left linear nodular opacities, similar prior exam. Unchanged biapical pleuroparenchymal scarring. No new parenchymal process.  No pleural  effusion or pneumothorax. IMPRESSION: No new parenchymal process. Bilateral right greater than left linear nodular opacities, unchanged compared to most recent prior exam, but improved compared to January 22, 2020 prior. Findings may be due to scarring, underlying chronic atypical infection is an additional possibility. Electronically Signed   By: Yetta Glassman M.D.   On: 01/18/2021 16:45    Procedures Procedures   Medications Ordered in ED Medications  AeroChamber Plus Flo-Vu Medium MISC 1 each (has no administration in time range)  methylPREDNISolone sodium succinate (SOLU-MEDROL) 125 mg/2 mL injection 125 mg (125 mg Intravenous Given 01/18/21 1730)  albuterol (VENTOLIN HFA) 108 (90 Base) MCG/ACT inhaler 4 puff (4 puffs Inhalation Given 01/18/21 1820)  guaiFENesin (ROBITUSSIN) 100 MG/5ML solution 200 mg (200 mg Oral Given 01/18/21 1911)  levofloxacin (LEVAQUIN) tablet 750 mg (750 mg Oral Given 01/18/21 1911)    ED Course  I have reviewed the triage vital signs and the nursing notes.  Pertinent labs & imaging results that were available during my care of the patient were reviewed by me and considered in my medical decision making (see chart for details).    MDM Rules/Calculators/A&P                           Patient's labs and imaging reviewed and discussed with patient.  Her chest x-ray is stable, but does question possibility of chronic atypical infection versus scarring.  She does have some blood-tinged sputum which she has had in the past but she states it is more copious than previous.  She has had cough and difficulty expelling her sputum as well.  No fevers.  She does endorse some nasal congestion but no epistaxis or visualized blood with nose blowing.  Her COVID test is negative.  She was given an IV dose of Solu-Medrol along with albuterol MDI while awaiting her COVID results.  At reexam her wheezing was completely resolved, O2 sats remained stable on her chronic 2 L [place].  She did have  a rhonchorous respiration high in her upper chest which I suspect was radiating pain from her upper respiratory.  She was able to cough and clear her throat and this resolved.  She was put on a prednisone pulse dosing 50 mg x 5 days, also was started on Levaquin given her x-ray findings.  She was also given guaifenesin with instructions that she must drink lots of fluids with this medication or it may cause her sputum to be stickier and harder to cough.  Return precautions were discussed.  She was stable at time of discharge. Final Clinical Impression(s) / ED Diagnoses Final diagnoses:  COPD exacerbation ([place])    Rx / DC Orders ED Discharge Orders          Ordered    levofloxacin (LEVAQUIN) 750 MG tablet  Daily        01/18/21 1912    guaiFENesin (ROBITUSSIN) 100 MG/5ML liquid  Every 4 hours PRN        01/18/21 1912    predniSONE (DELTASONE) 50 MG tablet        01/18/21 1912             Evalee Jefferson, PA-C 37/16/96 7893    Lianne Cure, DO 81/01/75 2223

## 2021-01-18 NOTE — ED Triage Notes (Signed)
Pt c/o shortness of breathe, headache, and congestion x 3 days. On 2L chronically.

## 2021-01-25 DIAGNOSIS — J441 Chronic obstructive pulmonary disease with (acute) exacerbation: Secondary | ICD-10-CM | POA: Diagnosis not present

## 2021-01-28 ENCOUNTER — Other Ambulatory Visit: Payer: Self-pay

## 2021-01-28 ENCOUNTER — Encounter: Payer: Self-pay | Admitting: Internal Medicine

## 2021-01-28 ENCOUNTER — Ambulatory Visit: Payer: Medicare Other | Admitting: Internal Medicine

## 2021-01-28 DIAGNOSIS — J9612 Chronic respiratory failure with hypercapnia: Secondary | ICD-10-CM | POA: Diagnosis not present

## 2021-01-28 DIAGNOSIS — J449 Chronic obstructive pulmonary disease, unspecified: Secondary | ICD-10-CM

## 2021-01-28 DIAGNOSIS — J9611 Chronic respiratory failure with hypoxia: Secondary | ICD-10-CM | POA: Diagnosis not present

## 2021-01-28 MED ORDER — AZITHROMYCIN 250 MG PO TABS: ORAL_TABLET | ORAL | 0 refills | Status: DC

## 2021-01-28 MED ORDER — STIOLTO RESPIMAT 2.5-2.5 MCG/ACT IN AERS: 2.0000 | INHALATION_SPRAY | Freq: Every day | RESPIRATORY_TRACT | 11 refills | Status: DC

## 2021-01-28 MED ORDER — PREDNISONE 10 MG PO TABS: ORAL_TABLET | ORAL | 0 refills | Status: DC

## 2021-01-28 MED ORDER — STIOLTO RESPIMAT 2.5-2.5 MCG/ACT IN AERS: 2.0000 | INHALATION_SPRAY | Freq: Every day | RESPIRATORY_TRACT | 0 refills | Status: DC

## 2021-01-28 NOTE — Assessment & Plan Note (Signed)
HCO3  01/27/20   = 34  HC03   01/18/21   = 35  - as of 05/11/2020  2lpm hs and prn daytime   Pt requesting eval for POC   Advised: Make sure you check your oxygen saturation  at your highest level of activity  to be sure it stays over 90% and adjust  02 flow upward to maintain this level if needed but remember to turn it back to previous settings when you stop (to conserve your supply).          Each maintenance medication was reviewed in detail including emphasizing most importantly the difference between maintenance and prns and under what circumstances the prns are to be triggered using an action plan format where appropriate.  Total time for H and P, chart review, counseling, reviewing hfa/neb and smi device(s) and generating customized AVS unique to this office visit / same day charting  > 35 min

## 2021-01-28 NOTE — Progress Notes (Signed)
Marilyn Solomon, female    DOB: 08-13-1945,    MRN: [id number]   Brief patient profile:  3  yowf quit smoking 07/1998 at dx of L PTX which did not recur p L Chest tube not limited by breathing or coughing but around  Around 2009 with dx of copd and atypical TB by Marilyn Solomon and coughing ever since s/p admit:    Admit date: 01/22/2020 Discharge date: 01/27/2020   Admitted From: Home Disposition: Home.  Patient refused SNF placement  Home Health: Patient refused home health services Equipment/Devices: Continue supplemental oxygen.  Patient uses oxygen via nasal cannula at 2L/min at home normally.     Brief/Interim Summary: 75 year old female with COPD and chronic hypoxic respiratory failure failure on 2 L oxygen,?  MAI, hypothyroidism presented with worsening shortness of breath and cough.  On presentation, creatinine was 1.63, baseline creatinine 0.6.  Chest x-ray showed evidence of chronic changes, unable to rule out low-grade infection.  CT of the chest showed long-standing chronic bronchitis with multifocal airway collapse.   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 At the lung bases there is true consolidative opacities and acute on chronic infection is possible.  She was started on antibiotics.  Pulmonary was consulted.  Pulmonary recommended sputum AFB testing and outpatient follow-up with pulmonary along with antibiotic treatment for total of 10 days.  AFB x1 has been negative so far; second AFB test result is pending and third sputum sample will be collected prior to discharge today.  Patient is very deconditioned.  PT recommended SNF placement.  Patient refused SNF placement and is also not agreeable for home health services.  She will be discharged home with outpatient follow-up with PCP and pulmonary.     Discharge Diagnoses:    Acute kidney injury -Probably from dehydration and  poor oral intake.  Creatinine 1.63 on presentation, baseline creatinine 0.6.  Treated with IV fluids.  Creatinine 0.66 today.  No labs today.  Repeat a.m. labs. -Patient was restarted on IV fluids on 01/25/2020 evening because of very poor oral intake -Creatinine stable.  Outpatient follow-up of creatinine.   Acute on chronic hypoxic respiratory failure Probable community-acquired bacterial pneumonia Chronic lung disease/?  MAI -Started on Rocephin and Zithromax.  Subsequently switched to Levaquin 500 mg daily as per pulmonary recommendations.    Continue Levaquin for 5 more days to complete 10-day course of therapy of antibiotics as per pulmonary recommendations.  Outpatient follow-up with pulmonary. -Pulmonary recommended AFB x3 sputum  > 12/2819 grew M abscessus complex sensitive to macrolides      Acute metabolic encephalopathy -Patient was more drowsy on the evening of 01/25/2020 as per the nursing staff.  CT of the head was negative for acute intracranial abnormality.  Dose of Xanax decreased.  More awake but still very slow to respond.   -PT recommended SNF placement but patient adamant about not going to SNF and also adamant that she does not any home health services   Anxiety/depression -Patient is on very high dose of Xanax 1 mg 3 times a day as needed at home.  I have decreased it to 0.25 mg 3 times a day as needed.  Discharged home on 0.5 mg 3 times a day as needed.  Outpatient follow-up with PCP regarding follow-up of the same.   COPD -Continue home regimen. Outpatient follow-up with pulmonary   Hypothyroidism-continue levothyroxine   History of Present Illness  05/11/2020  Pulmonary/  1st office eval/ Marilyn Solomon / [facility] re f/u atypical TB  Chief Complaint  Patient presents with   Hospitalization Follow-up    Breathing has improved since hospital d/c and she states not coughing at this time. She states using o2 "at home when I need it" and also at bedtime- 2lpm.    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  Pain should improve w/in 2 weeks and if not then consider further GI work up.    We will call to refer you to Internal Medicine in [place]  Please schedule a follow up visit in 3 months with pfts same day if possible  -need  alpha one blood test next time you have your blood drawn - plus needs quant Ig's   Thyroid surgery September 13 2020  Marilyn Solomon    12/16/2020  f/u ov/[place] office/Marilyn Solomon re:  GOL D? Copd maint on trelegy  Chief Complaint  Patient presents with   Follow-up    Patient reports some short ness of  breath and cough (white at times) She feels like its thick. She reports some ear pain and sinus pressure.   Dyspnea:  worse since thyroid surgery with dysphagia  Cough: white  Sleeping: lots of nasal and chest congestion better p neb alb  SABA use: helps some 02: 2lpm  Covid status: vax x 2  Rec Prednisone 10 mg take  4 each am x 2 days,   2 each am x 2 days,  1 each am x 2 days and stop  Continue Pantoprazole (protonix) 40 mg   Take  30-60 min before first meal of the day and add Pepcid (famotidine)  20 mg after supper until  you see Marilyn Redmond Baseman Please remember to go to the lab department   for your tests - we will call you with the results when they are available.    Please schedule a follow up office visit in 6 weeks, call sooner if needed with all medications /  inhalers/ solutions in hand   - PFTs in meantime if possible   01/03/21 held trelegy due to throat irritation   01/28/2021  f/u ov/Katherleen Folkes re: GOLD IV  maint on nothing  due to throat irritation form trelegy, still not better  Chief Complaint  Patient presents with   Follow-up    2L O2 all of the time including while sleeping for over a year. Wheezing in the mornings when first waking up and at bedtime. Sometimes when coughing very hard sometimes blood does come up.    Dyspnea:  worse off trelegy throat still no better [facility] Baseman referred to [facility]  Cough: feels like mucus stuck in throat but none every produced just traces of brb Sleeping:45 degrees since surgery as long as sleeping like this >not needing neb at night SABA use: too much  02: 2lpm 24/7  Covid status:   vax x 3/ never had virus    No obvious day to day or daytime variability or assoc excess/ purulent sputum or mucus plugs  or cp or chest tightness, subjective wheeze or overt sinus or hb symptoms.   Sleepoing as above without nocturnal  or early am exacerbation  of respiratory  c/o's or need for noct saba. Also denies any obvious fluctuation of symptoms with weather or  environmental changes or other aggravating or alleviating factors except as outlined above   No unusual exposure hx or h/o childhood pna/ asthma or knowledge of premature birth.  Current Allergies, Complete Past Medical History, Past Surgical History, Family History, and Social History were reviewed in [employer] record.  ROS  The following are not active complaints unless bolded Hoarseness, sore throat, dysphagia, dental problems, itching, sneezing,  nasal congestion or discharge of excess mucus or purulent secretions, ear ache,   fever, chills, sweats, unintended wt loss or wt gain, classically pleuritic or exertional cp,  orthopnea pnd or arm/hand swelling  or leg swelling, presyncope, palpitations, abdominal pain, anorexia, nausea, vomiting, diarrhea  or change in bowel habits or change in bladder habits, change in stools or change in urine, dysuria, hematuria,  rash, arthralgias, visual complaints, headache, numbness, weakness or ataxia or problems with walking or coordination,  change in mood or  memory.        Current Meds  Medication Sig   albuterol (PROVENTIL) (2.5 MG/3ML) 0.083% nebulizer solution Take 3 mLs (2.5 mg total) by nebulization every 6 (six) hours as needed for wheezing or shortness of breath.   albuterol (VENTOLIN HFA) 108 (90 Base) MCG/ACT inhaler Inhale 1-2 puffs into the lungs every 6 (six) hours as needed for wheezing or shortness of breath.   ALPRAZolam (XANAX) 1 MG tablet Take 1 tablet (1 mg total) by mouth every 8 (eight) hours as needed for anxiety.   amLODipine (NORVASC) 5 MG tablet TAKE 1 TABLET BY MOUTH DAILY.   azithromycin (ZITHROMAX) 250 MG tablet 2 on day one and then 1 daily   Fluticasone-Umeclidin-Vilant (TRELEGY ELLIPTA) 100-62.5-25 MCG/INH AEPB Inhale 1 puff into the lungs daily as needed (respiratory issues).   ibuprofen (ADVIL) 200 MG tablet Take 400 mg by mouth every 8 (eight) hours as needed (pain).   levofloxacin (LEVAQUIN) 750  MG tablet Take 1 tablet (750 mg total) by mouth daily.   levothyroxine (SYNTHROID) 75 MCG tablet Take 1 tablet (75 mcg total) by mouth daily.   oxyCODONE (ROXICODONE) 15 MG immediate release tablet TAKE (1) TABLET BY MOUTH EVERY 8 HOURS AS NEEDED.   pantoprazole (PROTONIX) 40 MG tablet  TAKE 1 TABLET BY MOUTH DAILY. (Patient taking differently: Take 40 mg by mouth daily.)   predniSONE (DELTASONE) 10 MG tablet Take  4 each am x 2 days,   2 each am x 2 days,  1 each am x 2 days and stop   promethazine-dextromethorphan (PROMETHAZINE-DM) 6.25-15 MG/5ML syrup Take 5 mLs by mouth 4 (four) times daily as needed for cough.   Tiotropium Bromide-Olodaterol (STIOLTO RESPIMAT) 2.5-2.5 MCG/ACT AERS Inhale 2 puffs into the lungs daily.   Vitamin D, Ergocalciferol, (DRISDOL) 1.25 MG (50000 UNIT) CAPS capsule Take 50,000 Units by mouth every Sunday.   [DISCONTINUED] guaiFENesin (ROBITUSSIN) 100 MG/5ML liquid Take 5-10 mLs (100-200 mg total) by mouth every 4 (four) hours as needed for cough.   [DISCONTINUED] predniSONE (DELTASONE) 50 MG tablet Take one tablet daily for 5 days                   Past Medical History:  Diagnosis Date   Anemia    Anxiety    Arthritis    Chronic back pain    COPD (chronic obstructive pulmonary disease) (HCC)    GERD (gastroesophageal reflux disease)    Hypertension    Hypothyroidism    Reflux    no medications currently, asymptomatic   Thyroid disease         Objective:    01/28/2021          99  12/16/2020        10 2 05/11/20 106 lb 8 oz (48.3 kg)  02/10/20 106 lb (48.1 kg)  01/22/20 114 lb (51.7 kg)     Vital signs reviewed  01/28/2021  - Note at rest 02 sats  99% on 2lpm   General appearance:    chronically ill hoarse amb wf and at rest with prominent pseudowheeze  HEENT : pt wearing mask not removed for exam due to covid -19 concerns.    NECK :  without JVD/Nodes/TM/ nl carotid upstrokes bilaterally   LUNGS: no acc muscle use,  Mod barrel  contour  chest wall with bilateral  Distant bs s audible wheeze and  without cough on insp or exp maneuvers and mod  Hyperresonant  to  percussion bilaterally     CV:  RRR  no s3 or murmur or increase in P2, and no edema   ABD:  soft and nontender with pos mid insp Hoover's  in the supine position. No bruits or organomegaly appreciated, bowel sounds nl  MS:     ext warm without deformities, calf tenderness, cyanosis or clubbing No obvious joint restrictions   SKIN: warm and dry without lesions    NEURO:  alert, approp, nl sensorium with  no motor or cerebellar deficits apparent.        I personally reviewed images and agree with radiology impression as follows:  CXR:   portable 01/18/21  No new parenchymal process.  Bilateral right greater than left linear nodular opacities, unchanged compared to most recent prior exam, but improved compared to January 22, 2020 prior    Assessment

## 2021-01-28 NOTE — Assessment & Plan Note (Signed)
Quit smoking 2000 - maint on trelegy as of 05/11/2020  - Labs rec 12/16/2020  :     alpha one AT phenotype/ Quant Ig's   > declined labs - PFT's  01/11/21  FEV1 0.67 (29 % ) ratio 0.36  p 6 % improvement from saba p saba neb 3h  prior to study with DLCO  6.2 (31%) corrects to 2.7 (65%)  for alv volume and FV curve severe concavity  - 01/28/2021  After extensive coaching inhaler device,  effectiveness =    75% try stiolto 2 pffs each am    Pt is Group B in terms of symptom/risk and laba/lama therefore appropriate rx at this point >>>  Try stiolto 2 pff each am and avoid ICS for now in favor of prn prednisone given the problems with her upper airway.  saba is prn neb

## 2021-01-28 NOTE — Patient Instructions (Signed)
We will see if you qualify for POC   Zpak  Prednisone 10 mg take  4 each am x 2 days,   2 each am x 2 days,  1 each am x 2 days and stop    Plan A = Automatic = Always=    Stiolto 2 pffs each am  Work on inhaler technique:  relax and gently blow all the way out then take a nice smooth full deep breath back in, triggering the inhaler at same time you start breathing in.  Hold for up to 5 seconds if you can. Blow out thru nose. Rinse and gargle with water when done.  If mouth or throat bother you at all,  try brushing teeth/gums/tongue with arm and hammer toothpaste/ make a slurry and gargle and spit out.       Plan B = Backup (to supplement plan A, not to replace it) Only use your albuterol inhaler as a rescue medication to be used if you can't catch your breath by resting or doing a relaxed purse lip breathing pattern.  - The less you use it, the better it will work when you need it. - Ok to use the inhaler up to 2 puffs  every 4 hours if you must but call for appointment if use goes up over your usual need - Don't leave home without it !!  (think of it like the spare tire for your car)   Plan C = Crisis (instead of Plan B but only if Plan B stops working) - only use your albuterol nebulizer if you first try Plan B and it fails to help > ok to use the nebulizer up to every 4 hours but if start needing it regularly call for immediate appointment  Please schedule a follow up office visit in 6-8 weeks, call sooner if needed

## 2021-02-02 DIAGNOSIS — J3801 Paralysis of vocal cords and larynx, unilateral: Secondary | ICD-10-CM | POA: Diagnosis not present

## 2021-02-02 DIAGNOSIS — Z923 Personal history of irradiation: Secondary | ICD-10-CM | POA: Diagnosis not present

## 2021-02-02 DIAGNOSIS — C73 Malignant neoplasm of thyroid gland: Secondary | ICD-10-CM | POA: Diagnosis not present

## 2021-02-02 DIAGNOSIS — Z9889 Other specified postprocedural states: Secondary | ICD-10-CM | POA: Diagnosis not present

## 2021-02-02 LAB — ALPHA-1-ANTITRYPSIN PHENOTYP: A-1 Antitrypsin: 162 mg/dL (ref 101–187)

## 2021-02-05 ENCOUNTER — Other Ambulatory Visit: Payer: Self-pay

## 2021-02-05 ENCOUNTER — Emergency Department (HOSPITAL_COMMUNITY)
Admission: EM | Admit: 2021-02-05 | Discharge: 2021-02-06 | Disposition: A | Discharge status: Home / Self Care | Payer: Medicare Other | Attending: Emergency Medicine | Admitting: Emergency Medicine

## 2021-02-05 ENCOUNTER — Encounter (HOSPITAL_COMMUNITY): Payer: Self-pay

## 2021-02-05 DIAGNOSIS — Z87891 Personal history of nicotine dependence: Secondary | ICD-10-CM | POA: Diagnosis not present

## 2021-02-05 DIAGNOSIS — E039 Hypothyroidism, unspecified: Secondary | ICD-10-CM | POA: Insufficient documentation

## 2021-02-05 DIAGNOSIS — N189 Chronic kidney disease, unspecified: Secondary | ICD-10-CM | POA: Insufficient documentation

## 2021-02-05 DIAGNOSIS — R0602 Shortness of breath: Secondary | ICD-10-CM | POA: Diagnosis not present

## 2021-02-05 DIAGNOSIS — J439 Emphysema, unspecified: Secondary | ICD-10-CM | POA: Diagnosis not present

## 2021-02-05 DIAGNOSIS — Z85850 Personal history of malignant neoplasm of thyroid: Secondary | ICD-10-CM | POA: Insufficient documentation

## 2021-02-05 DIAGNOSIS — Z96642 Presence of left artificial hip joint: Secondary | ICD-10-CM | POA: Diagnosis not present

## 2021-02-05 DIAGNOSIS — J441 Chronic obstructive pulmonary disease with (acute) exacerbation: Secondary | ICD-10-CM

## 2021-02-05 DIAGNOSIS — Z20822 Contact with and (suspected) exposure to covid-19: Secondary | ICD-10-CM | POA: Diagnosis not present

## 2021-02-05 DIAGNOSIS — I129 Hypertensive chronic kidney disease with stage 1 through stage 4 chronic kidney disease, or unspecified chronic kidney disease: Secondary | ICD-10-CM | POA: Diagnosis not present

## 2021-02-05 DIAGNOSIS — Z8616 Personal history of COVID-19: Secondary | ICD-10-CM | POA: Insufficient documentation

## 2021-02-05 DIAGNOSIS — Z79899 Other long term (current) drug therapy: Secondary | ICD-10-CM | POA: Insufficient documentation

## 2021-02-05 NOTE — ED Triage Notes (Signed)
Pt reports sob since yesterday. Pt has COPD, has used 2 nebs all day. Pt on 2L O2 all the time. Pt in no respiratory distress at this time.

## 2021-02-06 ENCOUNTER — Emergency Department (HOSPITAL_COMMUNITY): Payer: Medicare Other

## 2021-02-06 DIAGNOSIS — J439 Emphysema, unspecified: Secondary | ICD-10-CM | POA: Diagnosis not present

## 2021-02-06 DIAGNOSIS — Z20822 Contact with and (suspected) exposure to covid-19: Secondary | ICD-10-CM | POA: Diagnosis not present

## 2021-02-06 DIAGNOSIS — R0602 Shortness of breath: Secondary | ICD-10-CM | POA: Diagnosis not present

## 2021-02-06 DIAGNOSIS — J441 Chronic obstructive pulmonary disease with (acute) exacerbation: Secondary | ICD-10-CM | POA: Diagnosis not present

## 2021-02-06 LAB — BASIC METABOLIC PANEL
Anion gap: 7 (ref 5–15)
BUN: 19 mg/dL (ref 8–23)
CO2: 33 mmol/L — ABNORMAL HIGH (ref 22–32)
Calcium: 7.9 mg/dL — ABNORMAL LOW (ref 8.9–10.3)
Chloride: 95 mmol/L — ABNORMAL LOW (ref 98–111)
Creatinine, Ser: 0.84 mg/dL (ref 0.44–1.00)
GFR, Estimated: >60 mL/min (ref >60)
Glucose, Bld: 116 mg/dL — ABNORMAL HIGH (ref 70–99)
Potassium: 4.6 mmol/L (ref 3.5–5.1)
Sodium: 135 mmol/L (ref 135–145)

## 2021-02-06 LAB — CBC WITH DIFFERENTIAL/PLATELET
Abs Immature Granulocytes: 0.04 10*3/uL (ref 0.00–0.07)
Basophils Absolute: 0 10*3/uL (ref 0.0–0.1)
Basophils Relative: 0 %
Eosinophils Absolute: 0.1 10*3/uL (ref 0.0–0.5)
Eosinophils Relative: 1 %
HCT: 35.9 % — ABNORMAL LOW (ref 36.0–46.0)
Hemoglobin: 10.7 g/dL — ABNORMAL LOW (ref 12.0–15.0)
Immature Granulocytes: 0 %
Lymphocytes Relative: 7 %
Lymphs Abs: 0.7 10*3/uL (ref 0.7–4.0)
MCH: 26.1 pg (ref 26.0–34.0)
MCHC: 29.8 g/dL — ABNORMAL LOW (ref 30.0–36.0)
MCV: 87.6 fL (ref 80.0–100.0)
Monocytes Absolute: 0.8 10*3/uL (ref 0.1–1.0)
Monocytes Relative: 8 %
Neutro Abs: 8.3 10*3/uL — ABNORMAL HIGH (ref 1.7–7.7)
Neutrophils Relative %: 84 %
Platelets: 268 10*3/uL (ref 150–400)
RBC: 4.1 MIL/uL (ref 3.87–5.11)
RDW: 14.5 % (ref 11.5–15.5)
WBC: 10 10*3/uL (ref 4.0–10.5)
nRBC: 0 % (ref 0.0–0.2)

## 2021-02-06 LAB — RESP PANEL BY RT-PCR (FLU A&B, COVID) ARPGX2
Influenza A by PCR: NEGATIVE
Influenza B by PCR: NEGATIVE
SARS Coronavirus 2 by RT PCR: NEGATIVE

## 2021-02-06 MED ORDER — PREDNISONE 50 MG PO TABS
50.0000 mg | ORAL_TABLET | Freq: Every day | ORAL | 0 refills | Status: DC
Start: 2021-02-06 — End: 2021-02-12

## 2021-02-06 MED ORDER — IPRATROPIUM-ALBUTEROL 0.5-2.5 (3) MG/3ML IN SOLN
3.0000 mL | Freq: Once | RESPIRATORY_TRACT | Status: AC
  Administered 2021-02-06: 3 mL via RESPIRATORY_TRACT
  Filled 2021-02-06: qty 3

## 2021-02-06 MED ORDER — METHYLPREDNISOLONE SODIUM SUCC 125 MG IJ SOLR
125.0000 mg | Freq: Once | INTRAMUSCULAR | Status: AC
  Administered 2021-02-06: 125 mg via INTRAVENOUS
  Filled 2021-02-06: qty 2

## 2021-02-06 MED ORDER — ALBUTEROL SULFATE (2.5 MG/3ML) 0.083% IN NEBU
INHALATION_SOLUTION | RESPIRATORY_TRACT | Status: AC
  Filled 2021-02-06: qty 3

## 2021-02-06 NOTE — ED Provider Notes (Signed)
[facility] EMERGENCY DEPARTMENT Provider Note   CSN: [id number] Arrival date & time: 02/05/21  2339     History Chief Complaint  Patient presents with   Shortness of [place] is a 75 y.o. female.  The history is provided by the patient.  Shortness of Breath She has history of hypertension, COPD, thyroid cancer and comes in because of difficulty breathing which started yesterday and got worse today.  There has been a cough which is nonproductive.  She denies fever, chills, sweats.  She denies chest pain, heaviness, tightness, pressure.  She denies any nausea or vomiting.  She denies any sick contacts.  She has used her home inhaler and nebulizer with only slight relief.  She states she quit smoking 22 years ago.  She has been vaccinated against COVID-19 including 1 booster.  She denies history of hospitalization because of her COPD.   Past Medical History:  Diagnosis Date   Anemia    Anxiety    Arthritis    C. difficile diarrhea 10/08/2019   Chronic back pain    COPD (chronic obstructive pulmonary disease) (HCC)    GERD (gastroesophageal reflux disease)    HCAP (healthcare-associated pneumonia) 06/28/2017   Hypertension    Hypothyroidism    On home oxygen therapy    2L   Pneumonia due to COVID-19 virus 09/28/2019   Reflux    no medications currently, asymptomatic   Thyroid disease     Patient Active Problem List   Diagnosis Date Noted   Complete paralysis of right vocal cord 08/27/2020   Malignant neoplasm of thyroid gland ([place]) 07/08/2020   Other chronic pain 06/17/2020   Multinodular goiter 06/10/2020   Neck mass 06/03/2020   Hearing abnormally acute, right 06/03/2020   Chronic respiratory failure with hypoxia and hypercapnia ([place]) 05/11/2020   Irritable bowel syndrome with atypical cp  05/11/2020   Acute kidney injury superimposed on CKD ([place]) 01/23/2020   Hypertension 06/02/2018   Positive colorectal cancer screening using Cologuard test  08/21/2017   Normocytic anemia    Anxiety 06/30/2017   Hypothyroidism 06/30/2017   GERD (gastroesophageal reflux disease) 06/30/2017   COPD GOLD IV  06/30/2017   Elevated liver enzymes 06/30/2017   Severe Iron deficiency anemia 06/30/2017   Second degree hemorrhoids     Past Surgical History:  Procedure Laterality Date   ABDOMINAL HYSTERECTOMY     BIOPSY  10/08/2017   Procedure: BIOPSY;  Surgeon: Daneil Dolin, MD;  [facility];  Service: Endoscopy;;  gastric   cataracts Bilateral    COLONOSCOPY  2007   Dr. Gala Romney: normal rectum, diverticula    COLONOSCOPY WITH PROPOFOL N/A 11/15/2015   Dr. Gala Romney: grade II hemorrhoids, diverticulosis   COLONOSCOPY WITH PROPOFOL N/A 10/08/2017   Rourk: Diverticulosis   ESOPHAGOGASTRODUODENOSCOPY (EGD) WITH PROPOFOL N/A 10/08/2017   Rourk: Nonobstructing Schatzki ring, mild erosive reflux esophagitis, small hiatal hernia, gastritis but no H. pylori.   FRACTURE SURGERY Right 2005   Wrist   JOINT REPLACEMENT Left 2000   hip   RADICAL NECK DISSECTION Right 09/13/2020   Procedure: RADICAL NECK DISSECTION;  Surgeon: Melida Quitter, MD;  Location: [place];  Service: ENT;  Laterality: Right;   THYROIDECTOMY N/A 09/13/2020   Procedure: TOTAL THYROIDECTOMY;  Surgeon: Melida Quitter, MD;  Location: [place];  Service: ENT;  Laterality: N/A;   TOTAL HIP ARTHROPLASTY Left    WRIST SURGERY Right      OB History   No  obstetric history on file.     Family History  Problem Relation Age of Onset   Bone cancer Sister    Pancreatic cancer Sister    Pneumonia Mother    Heart attack Father    Diabetes Sister    Dementia Sister    Colon cancer Neg Hx     Social History   Tobacco Use   Smoking status: Former    Packs/day: 25.00    Years: 1.00    Pack years: 25.00    Types: Cigarettes    Quit date: 08/27/1998    Years since quitting: 22.4   Smokeless tobacco: Never   Tobacco comments:    quit in 2000 Verified 01/28/21 MR   Vaping Use   Vaping  Use: Never used  Substance Use Topics   Alcohol use: No    Alcohol/week: 0.0 standard drinks   Drug use: No    Home Medications Prior to Admission medications   Medication Sig Start Date End Date Taking? Authorizing Provider  albuterol (PROVENTIL) (2.5 MG/3ML) 0.083% nebulizer solution Take 3 mLs (2.5 mg total) by nebulization every 6 (six) hours as needed for wheezing or shortness of breath. 12/14/20   Tanda Rockers, MD  albuterol (VENTOLIN HFA) 108 (90 Base) MCG/ACT inhaler Inhale 1-2 puffs into the lungs every 6 (six) hours as needed for wheezing or shortness of breath. 09/02/20   Lindell Spar, MD  ALPRAZolam Duanne Moron) 1 MG tablet Take 1 tablet (1 mg total) by mouth every 8 (eight) hours as needed for anxiety. 01/17/21   Lindell Spar, MD  amLODipine (NORVASC) 5 MG tablet TAKE 1 TABLET BY MOUTH DAILY. 09/23/20   Lindell Spar, MD  azithromycin (ZITHROMAX) 250 MG tablet 2 on day one and then 1 daily 01/28/21   Tanda Rockers, MD  ibuprofen (ADVIL) 200 MG tablet Take 400 mg by mouth every 8 (eight) hours as needed (pain).    [provider]  levofloxacin (LEVAQUIN) 750 MG tablet Take 1 tablet (750 mg total) by mouth daily. 01/18/21   Evalee Jefferson, PA-C  levothyroxine (SYNTHROID) 75 MCG tablet Take 1 tablet (75 mcg total) by mouth daily. 01/06/21   Lindell Spar, MD  oxyCODONE (ROXICODONE) 15 MG immediate release tablet TAKE (1) TABLET BY MOUTH EVERY 8 HOURS AS NEEDED. 01/17/21   Lindell Spar, MD  pantoprazole (PROTONIX) 40 MG tablet TAKE 1 TABLET BY MOUTH DAILY. Patient taking differently: Take 40 mg by mouth daily. 03/23/20   Mahala Menghini, PA-C  predniSONE (DELTASONE) 10 MG tablet Take  4 each am x 2 days,   2 each am x 2 days,  1 each am x 2 days and stop 01/28/21   Tanda Rockers, MD  promethazine-dextromethorphan (PROMETHAZINE-DM) 6.25-15 MG/5ML syrup Take 5 mLs by mouth 4 (four) times daily as needed for cough. 01/06/21   Lindell Spar, MD  Tiotropium Bromide-Olodaterol  (STIOLTO RESPIMAT) 2.5-2.5 MCG/ACT AERS Inhale 2 puffs into the lungs daily. 01/28/21   Tanda Rockers, MD  Tiotropium Bromide-Olodaterol (STIOLTO RESPIMAT) 2.5-2.5 MCG/ACT AERS Inhale 2 puffs into the lungs daily for 28 doses. 01/28/21 02/25/21  Tanda Rockers, MD  Vitamin D, Ergocalciferol, (DRISDOL) 1.25 MG (50000 UNIT) CAPS capsule Take 50,000 Units by mouth every Sunday.    [provider]    Allergies    Penicillins and Vicodin [hydrocodone-acetaminophen]  Review of Systems   Review of Systems  Respiratory:  Positive for shortness of breath.   All other  systems reviewed and are negative.  Physical Exam Updated Vital Signs BP (!) 147/65   Pulse 99   Temp 98.4 F (36.9 C) (Oral)   Resp 20   Ht 5\' 5"  (1.651 m)   Wt 45 kg   SpO2 99%   BMI 16.51 kg/m   Physical Exam Vitals and nursing note reviewed.  75 year old female, resting comfortably and in no acute distress. Vital signs are significant for mildly elevated blood pressure. Oxygen saturation is 99%, which is normal. Head is normocephalic and atraumatic. PERRLA, EOMI. Oropharynx is clear. Neck is nontender and supple without adenopathy or JVD. Back is nontender and there is no CVA tenderness. Lungs have coarse inspiratory and expiratory rhonchi without wheezes or rales. Chest is nontender. Heart has regular rate and rhythm without murmur. Abdomen is soft, flat, nontender without masses or hepatosplenomegaly and peristalsis is normoactive. Extremities have no cyanosis or edema, full range of motion is present. Skin is warm and dry without rash. Neurologic: Mental status is normal, cranial nerves are intact, there are no motor or sensory deficits.  ED Results / Procedures / Treatments   Labs (all labs ordered are listed, but only abnormal results are displayed) Labs Reviewed  BASIC METABOLIC PANEL - Abnormal; Notable for the following components:      Result Value   Chloride 95 (*)    CO2 33 (*)    Glucose, Bld  116 (*)    Calcium 7.9 (*)    All other components within normal limits  CBC WITH DIFFERENTIAL/PLATELET - Abnormal; Notable for the following components:   Hemoglobin 10.7 (*)    HCT 35.9 (*)    MCHC 29.8 (*)    Neutro Abs 8.3 (*)    All other components within normal limits  RESP PANEL BY RT-PCR (FLU A&B, COVID) [id number]    Radiology DG Chest Port 1 View  Result Date: 02/06/2021 CLINICAL DATA:  Shortness of breath. EXAM: PORTABLE CHEST 1 VIEW COMPARISON:  Chest radiograph dated 01/18/2021. FINDINGS: Emphysema with biapical subpleural scarring. No consolidative changes. There is no pleural effusion or pneumothorax. The cardiac silhouette is within normal limits. Atherosclerotic calcification of the aorta. Osteopenia with degenerative changes of the spine. No acute osseous pathology. IMPRESSION: 1. No acute cardiopulmonary process. 2. Emphysema. Electronically Signed   By: Anner Crete M.D.   On: 02/06/2021 00:32    Procedures Procedures   Medications Ordered in ED Medications  methylPREDNISolone sodium succinate (SOLU-MEDROL) 125 mg/2 mL injection 125 mg (125 mg Intravenous Given 02/06/21 0058)  ipratropium-albuterol (DUONEB) 0.5-2.5 (3) MG/3ML nebulizer solution 3 mL (3 mLs Nebulization Given 02/06/21 0238)  albuterol (PROVENTIL) (2.5 MG/3ML) 0.083% nebulizer solution (  Given 02/06/21 0242)    ED Course  I have reviewed the triage vital signs and the nursing notes.  Pertinent labs & imaging results that were available during my care of the patient were reviewed by me and considered in my medical decision making (see chart for details).   MDM Rules/Calculators/A&P                         COPD exacerbation.  Old records are reviewed, and she actually has several hospitalizations for COPD exacerbation.  Will check chest x-ray to rule out pneumonia.  She will be given inhaled albuterol and ipratropium, IV methylprednisolone.  Chest x-ray shows no acute process.  Respiratory pathogen  panel was negative for COVID-19 and influenza.  She feels much better after above-noted treatment.  She is discharged with prescription for prednisone, follow-up with PCP.  Return precautions discussed.  Final Clinical Impression(s) / ED Diagnoses Final diagnoses:  COPD exacerbation ([place])    Rx / DC Orders ED Discharge Orders          Ordered    predniSONE (DELTASONE) 50 MG tablet  Daily        02/06/21 5027             Delora Fuel, MD 14/23/20 [phone]

## 2021-02-06 NOTE — Discharge Instructions (Addendum)
Continue using your rescue inhaler and nebulizer as needed.  Return if symptoms are getting worse.

## 2021-02-10 ENCOUNTER — Telehealth: Payer: Self-pay | Admitting: Internal Medicine

## 2021-02-10 NOTE — Telephone Encounter (Signed)
ATC patient for further details. Unable to leave voicemail.   Primary Pulmonologist: wert Last office visit and with whom: 01/28/21 Wert What do we see them for (pulmonary problems): COPD Last OV assessment/plan: See below   Was appointment offered to patient (explain)?  no  Reason for call:   Pt requesting 50 mg of prednisone.  States she was at the hospital last week.  Please advise.  [employer].   (examples of things to ask: : When did symptoms start? Fever? Cough? Productive? Color to sputum? More sputum than usual? Wheezing? Have you needed increased oxygen? Are you taking your respiratory medications? What over the counter measures have you tried?)  Allergies  Allergen Reactions   Penicillins Itching     Tolerated Ancef Intraop 09/13/20   Vicodin [Hydrocodone-Acetaminophen] Nausea And Vomiting    Immunization History  Administered Date(s) Administered   Influenza Split 03/08/2016   Influenza-Unspecified 01/28/2020   Moderna Sars-Covid-2 Vaccination 10/30/2019, 11/27/2019   Pneumococcal Polysaccharide-23 06/29/2017    Assessment  We will see if you qualify for POC    Zpak   Prednisone 10 mg take  4 each am x 2 days,   2 each am x 2 days,  1 each am x 2 days and stop     Plan A = Automatic = Always=    Stiolto 2 pffs each am   Work on inhaler technique:  relax and gently blow all the way out then take a nice smooth full deep breath back in, triggering the inhaler at same time you start breathing in.  Hold for up to 5 seconds if you can. Blow out thru nose. Rinse and gargle with water when done.  If mouth or throat bother you at all,  try brushing teeth/gums/tongue with arm and hammer toothpaste/ make a slurry and gargle and spit out.         Plan B = Backup (to supplement plan A, not to replace it) Only use your albuterol inhaler as a rescue medication to be used if you can't catch your breath by resting or doing a relaxed purse lip breathing pattern.  - The  less you use it, the better it will work when you need it. - Ok to use the inhaler up to 2 puffs  every 4 hours if you must but call for appointment if use goes up over your usual need - Don't leave home without it !!  (think of it like the spare tire for your car)    Plan C = Crisis (instead of Plan B but only if Plan B stops working) - only use your albuterol nebulizer if you first try Plan B and it fails to help > ok to use the nebulizer up to every 4 hours but if start needing it regularly call for immediate appointment   Please schedule a follow up office visit in 6-8 weeks, call sooner if needed

## 2021-02-10 NOTE — Telephone Encounter (Signed)
Called and spoke with patient. She verbalized understanding about MW rec. of going to the ER; said she would plan on going.

## 2021-02-10 NOTE — Telephone Encounter (Signed)
Patient states that she has coughed up a little bit of blood today but it has been clear mucus with sometimes a yellow tint. Denies a fever. Says her symptoms have been going on for approx. 3-4 days now. Says she is having some Sob and some persistent wheezing. Feels that she cannot catch her breath. She has been using her inhaler and nebulizer and feels that they are helping some but not enough. Has not tried any OTC meds.   Dr. [name] please advise.

## 2021-02-11 DIAGNOSIS — C73 Malignant neoplasm of thyroid gland: Secondary | ICD-10-CM | POA: Diagnosis not present

## 2021-02-12 ENCOUNTER — Emergency Department (HOSPITAL_COMMUNITY): Payer: Medicare Other

## 2021-02-12 ENCOUNTER — Encounter (HOSPITAL_COMMUNITY): Payer: Self-pay | Admitting: Emergency Medicine

## 2021-02-12 ENCOUNTER — Emergency Department (HOSPITAL_COMMUNITY)
Admission: EM | Admit: 2021-02-12 | Discharge: 2021-02-12 | Disposition: A | Discharge status: Home / Self Care | Payer: Medicare Other | Attending: Emergency Medicine | Admitting: Emergency Medicine

## 2021-02-12 ENCOUNTER — Other Ambulatory Visit: Payer: Self-pay

## 2021-02-12 DIAGNOSIS — I10 Essential (primary) hypertension: Secondary | ICD-10-CM | POA: Insufficient documentation

## 2021-02-12 DIAGNOSIS — Z96642 Presence of left artificial hip joint: Secondary | ICD-10-CM | POA: Diagnosis not present

## 2021-02-12 DIAGNOSIS — R531 Weakness: Secondary | ICD-10-CM | POA: Diagnosis not present

## 2021-02-12 DIAGNOSIS — Z87891 Personal history of nicotine dependence: Secondary | ICD-10-CM | POA: Diagnosis not present

## 2021-02-12 DIAGNOSIS — J441 Chronic obstructive pulmonary disease with (acute) exacerbation: Secondary | ICD-10-CM | POA: Diagnosis not present

## 2021-02-12 DIAGNOSIS — E039 Hypothyroidism, unspecified: Secondary | ICD-10-CM | POA: Insufficient documentation

## 2021-02-12 DIAGNOSIS — Z8616 Personal history of COVID-19: Secondary | ICD-10-CM | POA: Insufficient documentation

## 2021-02-12 DIAGNOSIS — Z7951 Long term (current) use of inhaled steroids: Secondary | ICD-10-CM | POA: Insufficient documentation

## 2021-02-12 DIAGNOSIS — R0602 Shortness of breath: Secondary | ICD-10-CM | POA: Diagnosis not present

## 2021-02-12 DIAGNOSIS — J449 Chronic obstructive pulmonary disease, unspecified: Secondary | ICD-10-CM | POA: Diagnosis not present

## 2021-02-12 DIAGNOSIS — Z79899 Other long term (current) drug therapy: Secondary | ICD-10-CM | POA: Insufficient documentation

## 2021-02-12 DIAGNOSIS — Z85850 Personal history of malignant neoplasm of thyroid: Secondary | ICD-10-CM | POA: Insufficient documentation

## 2021-02-12 LAB — BASIC METABOLIC PANEL
Anion gap: 8 (ref 5–15)
BUN: 17 mg/dL (ref 8–23)
CO2: 35 mmol/L — ABNORMAL HIGH (ref 22–32)
Calcium: 8 mg/dL — ABNORMAL LOW (ref 8.9–10.3)
Chloride: 98 mmol/L (ref 98–111)
Creatinine, Ser: 0.71 mg/dL (ref 0.44–1.00)
GFR, Estimated: >60 mL/min (ref >60)
Glucose, Bld: 132 mg/dL — ABNORMAL HIGH (ref 70–99)
Potassium: 3.6 mmol/L (ref 3.5–5.1)
Sodium: 141 mmol/L (ref 135–145)

## 2021-02-12 LAB — CBC
HCT: 38.4 % (ref 36.0–46.0)
Hemoglobin: 11.5 g/dL — ABNORMAL LOW (ref 12.0–15.0)
MCH: 26.6 pg (ref 26.0–34.0)
MCHC: 29.9 g/dL — ABNORMAL LOW (ref 30.0–36.0)
MCV: 88.7 fL (ref 80.0–100.0)
Platelets: 236 10*3/uL (ref 150–400)
RBC: 4.33 MIL/uL (ref 3.87–5.11)
RDW: 14.9 % (ref 11.5–15.5)
WBC: 9.7 10*3/uL (ref 4.0–10.5)
nRBC: 0 % (ref 0.0–0.2)

## 2021-02-12 MED ORDER — AZITHROMYCIN 250 MG PO TABS: 250.0000 mg | ORAL_TABLET | Freq: Every day | ORAL | 0 refills | Status: DC

## 2021-02-12 MED ORDER — ALBUTEROL SULFATE HFA 108 (90 BASE) MCG/ACT IN AERS: 2.0000 | INHALATION_SPRAY | RESPIRATORY_TRACT | Status: DC | PRN

## 2021-02-12 MED ORDER — AZITHROMYCIN 250 MG PO TABS
500.0000 mg | ORAL_TABLET | Freq: Once | ORAL | Status: AC
  Administered 2021-02-12: 500 mg via ORAL
  Filled 2021-02-12: qty 2

## 2021-02-12 MED ORDER — IPRATROPIUM-ALBUTEROL 0.5-2.5 (3) MG/3ML IN SOLN
3.0000 mL | Freq: Once | RESPIRATORY_TRACT | Status: AC
  Administered 2021-02-12: 3 mL via RESPIRATORY_TRACT
  Filled 2021-02-12: qty 3

## 2021-02-12 MED ORDER — IPRATROPIUM BROMIDE 0.02 % IN SOLN
RESPIRATORY_TRACT | Status: AC
  Administered 2021-02-12: 0.5 mg
  Filled 2021-02-12: qty 2.5

## 2021-02-12 MED ORDER — PREDNISONE 10 MG PO TABS: 20.0000 mg | ORAL_TABLET | Freq: Two times a day (BID) | ORAL | 0 refills | Status: DC

## 2021-02-12 MED ORDER — METHYLPREDNISOLONE SODIUM SUCC 125 MG IJ SOLR
125.0000 mg | Freq: Once | INTRAMUSCULAR | Status: AC
  Administered 2021-02-12: 125 mg via INTRAVENOUS
  Filled 2021-02-12: qty 2

## 2021-02-12 MED ORDER — SODIUM CHLORIDE 0.9 % IV BOLUS
500.0000 mL | Freq: Once | INTRAVENOUS | Status: AC
  Administered 2021-02-12: 500 mL via INTRAVENOUS

## 2021-02-12 MED ORDER — ALBUTEROL SULFATE (2.5 MG/3ML) 0.083% IN NEBU
INHALATION_SOLUTION | RESPIRATORY_TRACT | Status: AC
  Administered 2021-02-12: 5 mg
  Filled 2021-02-12: qty 6

## 2021-02-12 NOTE — ED Provider Notes (Signed)
[facility] EMERGENCY DEPARTMENT Provider Note   CSN: [id number] Arrival date & time: 02/12/21  0327     History Chief Complaint  Patient presents with   Shortness of [place] is a 75 y.o. female.  Patient is a 75 year old female with past medical history of COPD on home oxygen, hypertension, GERD, anemia.  Patient presenting today for evaluation of shortness of breath.  This has been worsening over the past 2 days.  She describes some wheezing, but denies chest pain or productive cough.  She denies fevers or chills.  She denies COVID exposures and tells me she was tested for COVID just this week and was negative.  She has used her home nebulizer with some relief.  The history is provided by the patient.  Shortness of Breath Severity:  Moderate Onset quality:  Gradual Duration:  3 days Timing:  Constant Progression:  Worsening Chronicity:  Recurrent Context: activity   Relieved by:  Nothing Worsened by:  Nothing Ineffective treatments:  None tried     Past Medical History:  Diagnosis Date   Anemia    Anxiety    Arthritis    C. difficile diarrhea 10/08/2019   Chronic back pain    COPD (chronic obstructive pulmonary disease) (HCC)    GERD (gastroesophageal reflux disease)    HCAP (healthcare-associated pneumonia) 06/28/2017   Hypertension    Hypothyroidism    On home oxygen therapy    2L   Pneumonia due to COVID-19 virus 09/28/2019   Reflux    no medications currently, asymptomatic   Thyroid disease     Patient Active Problem List   Diagnosis Date Noted   Complete paralysis of right vocal cord 08/27/2020   Malignant neoplasm of thyroid gland ([place]) 07/08/2020   Other chronic pain 06/17/2020   Multinodular goiter 06/10/2020   Neck mass 06/03/2020   Hearing abnormally acute, right 06/03/2020   Chronic respiratory failure with hypoxia and hypercapnia ([place]) 05/11/2020   Irritable bowel syndrome with atypical cp  05/11/2020   Acute kidney injury  superimposed on CKD ([place]) 01/23/2020   Hypertension 06/02/2018   Positive colorectal cancer screening using Cologuard test 08/21/2017   Normocytic anemia    Anxiety 06/30/2017   Hypothyroidism 06/30/2017   GERD (gastroesophageal reflux disease) 06/30/2017   COPD GOLD IV  06/30/2017   Elevated liver enzymes 06/30/2017   Severe Iron deficiency anemia 06/30/2017   Second degree hemorrhoids     Past Surgical History:  Procedure Laterality Date   ABDOMINAL HYSTERECTOMY     BIOPSY  10/08/2017   Procedure: BIOPSY;  Surgeon: Daneil Dolin, MD;  [facility];  Service: Endoscopy;;  gastric   cataracts Bilateral    COLONOSCOPY  2007   Dr. Gala Romney: normal rectum, diverticula    COLONOSCOPY WITH PROPOFOL N/A 11/15/2015   Dr. Gala Romney: grade II hemorrhoids, diverticulosis   COLONOSCOPY WITH PROPOFOL N/A 10/08/2017   Rourk: Diverticulosis   ESOPHAGOGASTRODUODENOSCOPY (EGD) WITH PROPOFOL N/A 10/08/2017   Rourk: Nonobstructing Schatzki ring, mild erosive reflux esophagitis, small hiatal hernia, gastritis but no H. pylori.   FRACTURE SURGERY Right 2005   Wrist   JOINT REPLACEMENT Left 2000   hip   RADICAL NECK DISSECTION Right 09/13/2020   Procedure: RADICAL NECK DISSECTION;  Surgeon: Melida Quitter, MD;  Location: [place];  Service: ENT;  Laterality: Right;   THYROIDECTOMY N/A 09/13/2020   Procedure: TOTAL THYROIDECTOMY;  Surgeon: Melida Quitter, MD;  Location: [employer];  Service: ENT;  Laterality: N/A;   TOTAL HIP ARTHROPLASTY Left    WRIST SURGERY Right      OB History   No obstetric history on file.     Family History  Problem Relation Age of Onset   Bone cancer Sister    Pancreatic cancer Sister    Pneumonia Mother    Heart attack Father    Diabetes Sister    Dementia Sister    Colon cancer Neg Hx     Social History   Tobacco Use   Smoking status: Former    Packs/day: 25.00    Years: 1.00    Pack years: 25.00    Types: Cigarettes    Quit date: 08/27/1998    Years since  quitting: 22.4   Smokeless tobacco: Never   Tobacco comments:    quit in 2000 Verified 01/28/21 MR   Vaping Use   Vaping Use: Never used  Substance Use Topics   Alcohol use: No    Alcohol/week: 0.0 standard drinks   Drug use: No    Home Medications Prior to Admission medications   Medication Sig Start Date End Date Taking? Authorizing Provider  albuterol (PROVENTIL) (2.5 MG/3ML) 0.083% nebulizer solution Take 3 mLs (2.5 mg total) by nebulization every 6 (six) hours as needed for wheezing or shortness of breath. 12/14/20   Tanda Rockers, MD  albuterol (VENTOLIN HFA) 108 (90 Base) MCG/ACT inhaler Inhale 1-2 puffs into the lungs every 6 (six) hours as needed for wheezing or shortness of breath. 09/02/20   Lindell Spar, MD  ALPRAZolam Duanne Moron) 1 MG tablet Take 1 tablet (1 mg total) by mouth every 8 (eight) hours as needed for anxiety. 01/17/21   Lindell Spar, MD  amLODipine (NORVASC) 5 MG tablet TAKE 1 TABLET BY MOUTH DAILY. 09/23/20   Lindell Spar, MD  ibuprofen (ADVIL) 200 MG tablet Take 400 mg by mouth every 8 (eight) hours as needed (pain).    [provider]  levothyroxine (SYNTHROID) 75 MCG tablet Take 1 tablet (75 mcg total) by mouth daily. 01/06/21   Lindell Spar, MD  oxyCODONE (ROXICODONE) 15 MG immediate release tablet TAKE (1) TABLET BY MOUTH EVERY 8 HOURS AS NEEDED. 01/17/21   Lindell Spar, MD  pantoprazole (PROTONIX) 40 MG tablet TAKE 1 TABLET BY MOUTH DAILY. Patient taking differently: Take 40 mg by mouth daily. 03/23/20   Mahala Menghini, PA-C  predniSONE (DELTASONE) 50 MG tablet Take 1 tablet (50 mg total) by mouth daily. 09/05/79   Delora Fuel, MD  promethazine-dextromethorphan (PROMETHAZINE-DM) 6.25-15 MG/5ML syrup Take 5 mLs by mouth 4 (four) times daily as needed for cough. 01/06/21   Lindell Spar, MD  Tiotropium Bromide-Olodaterol (STIOLTO RESPIMAT) 2.5-2.5 MCG/ACT AERS Inhale 2 puffs into the lungs daily. 01/28/21   Tanda Rockers, MD  Tiotropium  Bromide-Olodaterol (STIOLTO RESPIMAT) 2.5-2.5 MCG/ACT AERS Inhale 2 puffs into the lungs daily for 28 doses. 01/28/21 02/25/21  Tanda Rockers, MD  Vitamin D, Ergocalciferol, (DRISDOL) 1.25 MG (50000 UNIT) CAPS capsule Take 50,000 Units by mouth every Sunday.    [provider]    Allergies    Penicillins and Vicodin [hydrocodone-acetaminophen]  Review of Systems   Review of Systems  Respiratory:  Positive for shortness of breath.   All other systems reviewed and are negative.  Physical Exam Updated Vital Signs BP 112/64   Pulse 96   Temp 97.9 F (36.6 C) (Oral)   Resp (!) 22   Ht 5\' 5"  (  1.651 m)   Wt 45 kg   SpO2 97%   BMI 16.51 kg/m   Physical Exam Vitals and nursing note reviewed.  Constitutional:      General: She is not in acute distress.    Appearance: She is well-developed. She is not diaphoretic.     Comments: Patient is a thin, 75 year old female  HENT:     Head: Normocephalic and atraumatic.  Cardiovascular:     Rate and Rhythm: Normal rate and regular rhythm.     Heart sounds: No murmur heard.   No friction rub. No gallop.  Pulmonary:     Effort: Pulmonary effort is normal. No respiratory distress.     Breath sounds: Examination of the right-middle field reveals rhonchi. Examination of the left-middle field reveals rhonchi. Rhonchi present. No wheezing.     Comments: Slight expiratory rhonchi bilaterally. Abdominal:     General: Bowel sounds are normal. There is no distension.     Palpations: Abdomen is soft.     Tenderness: There is no abdominal tenderness.  Musculoskeletal:        General: Normal range of motion.     Cervical back: Normal range of motion and neck supple.  Skin:    General: Skin is warm and dry.  Neurological:     General: No focal deficit present.     Mental Status: She is alert and oriented to person, place, and time.    ED Results / Procedures / Treatments   Labs (all labs ordered are listed, but only abnormal results are  displayed) Labs Reviewed  BASIC METABOLIC PANEL - Abnormal; Notable for the following components:      Result Value   CO2 35 (*)    Glucose, Bld 132 (*)    Calcium 8.0 (*)    All other components within normal limits  CBC - Abnormal; Notable for the following components:   Hemoglobin 11.5 (*)    MCHC 29.9 (*)    All other components within normal limits    EKG None  Radiology DG Chest Port 1 View  Result Date: 02/12/2021 CLINICAL DATA:  Shortness of breath and weakness. EXAM: PORTABLE CHEST 1 VIEW COMPARISON:  February 06, 2021 FINDINGS: The lungs are hyperinflated. Diffuse, chronic appearing increased lung markings are seen without evidence of focal consolidation. Mild biapical scarring and/or atelectasis is present with mild biapical pleural thickening. There is no evidence of a pleural effusion or pneumothorax. The heart size and mediastinal contours are within normal limits. The visualized skeletal structures are unremarkable. IMPRESSION: 1. Findings consistent with COPD. 2. No acute or active cardiopulmonary disease. Electronically Signed   By: Virgina Norfolk M.D.   On: 02/12/2021 04:17    Procedures Procedures   Medications Ordered in ED Medications  albuterol (VENTOLIN HFA) 108 (90 Base) MCG/ACT inhaler 2 puff (has no administration in time range)  methylPREDNISolone sodium succinate (SOLU-MEDROL) 125 mg/2 mL injection 125 mg (has no administration in time range)  sodium chloride 0.9 % bolus 500 mL (has no administration in time range)  ipratropium-albuterol (DUONEB) 0.5-2.5 (3) MG/3ML nebulizer solution 3 mL (has no administration in time range)  ipratropium (ATROVENT) 0.02 % nebulizer solution (0.5 mg  Given 02/12/21 0453)  albuterol (PROVENTIL) (2.5 MG/3ML) 0.083% nebulizer solution (5 mg  Given 02/12/21 0454)    ED Course  I have reviewed the triage vital signs and the nursing notes.  Pertinent labs & imaging results that were available during my care of the patient  were reviewed  by me and considered in my medical decision making (see chart for details).    MDM Rules/Calculators/A&P  Patient presenting here with complaints of shortness of breath.  She has history of COPD and is on chronic oxygen at 2 L nasal cannula.  Patient's presentation and work-up most consistent with COPD exacerbation.  She was given duo nebs along with Solu-Medrol and seems to be feeling better.  Her oxygen saturations are in the mid to upper 90s on 2 L and chest x-ray and laboratory studies are reassuring.  Patient seems appropriate for discharge with prednisone, Zithromax, and continued use of her nebulizer at home.  She is to return as needed.  Final Clinical Impression(s) / ED Diagnoses Final diagnoses:  SOB (shortness of breath)    Rx / DC Orders ED Discharge Orders     None        [name], MD 02/12/21 [phone]

## 2021-02-12 NOTE — Discharge Instructions (Addendum)
Begin taking Zithromax and prednisone as prescribed.  Continue your nebulizer treatments every 4 hours as needed.  Return to the emergency department if you develop severe chest pain, high fever, or other new and concerning symptoms.

## 2021-02-12 NOTE — ED Triage Notes (Signed)
Pt with c/o SOB and weakness. Pt with COPD and on home O2 @ 2L [name].

## 2021-02-16 ENCOUNTER — Other Ambulatory Visit: Payer: Self-pay | Admitting: Internal Medicine

## 2021-02-16 DIAGNOSIS — R1319 Other dysphagia: Secondary | ICD-10-CM | POA: Diagnosis not present

## 2021-02-16 DIAGNOSIS — G8929 Other chronic pain: Secondary | ICD-10-CM

## 2021-02-16 DIAGNOSIS — C73 Malignant neoplasm of thyroid gland: Secondary | ICD-10-CM | POA: Diagnosis not present

## 2021-02-16 DIAGNOSIS — J398 Other specified diseases of upper respiratory tract: Secondary | ICD-10-CM | POA: Diagnosis not present

## 2021-02-16 DIAGNOSIS — C781 Secondary malignant neoplasm of mediastinum: Secondary | ICD-10-CM | POA: Diagnosis not present

## 2021-02-17 ENCOUNTER — Other Ambulatory Visit: Payer: Self-pay

## 2021-02-17 DIAGNOSIS — Z85850 Personal history of malignant neoplasm of thyroid: Secondary | ICD-10-CM | POA: Diagnosis not present

## 2021-02-17 DIAGNOSIS — R600 Localized edema: Secondary | ICD-10-CM | POA: Diagnosis not present

## 2021-02-17 DIAGNOSIS — N189 Chronic kidney disease, unspecified: Secondary | ICD-10-CM | POA: Diagnosis not present

## 2021-02-17 DIAGNOSIS — C73 Malignant neoplasm of thyroid gland: Secondary | ICD-10-CM | POA: Diagnosis not present

## 2021-02-17 DIAGNOSIS — J441 Chronic obstructive pulmonary disease with (acute) exacerbation: Secondary | ICD-10-CM | POA: Diagnosis not present

## 2021-02-17 DIAGNOSIS — Z79899 Other long term (current) drug therapy: Secondary | ICD-10-CM | POA: Insufficient documentation

## 2021-02-17 DIAGNOSIS — I129 Hypertensive chronic kidney disease with stage 1 through stage 4 chronic kidney disease, or unspecified chronic kidney disease: Secondary | ICD-10-CM | POA: Diagnosis not present

## 2021-02-17 DIAGNOSIS — Z96642 Presence of left artificial hip joint: Secondary | ICD-10-CM | POA: Insufficient documentation

## 2021-02-17 DIAGNOSIS — Z8616 Personal history of COVID-19: Secondary | ICD-10-CM | POA: Diagnosis not present

## 2021-02-17 DIAGNOSIS — E039 Hypothyroidism, unspecified: Secondary | ICD-10-CM | POA: Diagnosis not present

## 2021-02-17 DIAGNOSIS — Z7951 Long term (current) use of inhaled steroids: Secondary | ICD-10-CM | POA: Insufficient documentation

## 2021-02-17 DIAGNOSIS — Z87891 Personal history of nicotine dependence: Secondary | ICD-10-CM | POA: Diagnosis not present

## 2021-02-17 DIAGNOSIS — R0602 Shortness of breath: Secondary | ICD-10-CM | POA: Diagnosis not present

## 2021-02-17 DIAGNOSIS — J439 Emphysema, unspecified: Secondary | ICD-10-CM | POA: Diagnosis not present

## 2021-02-18 ENCOUNTER — Emergency Department (HOSPITAL_COMMUNITY)
Admission: EM | Admit: 2021-02-18 | Discharge: 2021-02-18 | Disposition: A | Discharge status: Home / Self Care | Payer: Medicare Other | Attending: Emergency Medicine | Admitting: Emergency Medicine

## 2021-02-18 ENCOUNTER — Encounter (HOSPITAL_COMMUNITY): Payer: Self-pay | Admitting: Emergency Medicine

## 2021-02-18 ENCOUNTER — Emergency Department (HOSPITAL_COMMUNITY): Payer: Medicare Other

## 2021-02-18 DIAGNOSIS — J439 Emphysema, unspecified: Secondary | ICD-10-CM | POA: Diagnosis not present

## 2021-02-18 DIAGNOSIS — J441 Chronic obstructive pulmonary disease with (acute) exacerbation: Secondary | ICD-10-CM

## 2021-02-18 DIAGNOSIS — R0602 Shortness of breath: Secondary | ICD-10-CM | POA: Diagnosis not present

## 2021-02-18 LAB — COMPREHENSIVE METABOLIC PANEL
ALT: 20 U/L (ref 0–44)
AST: 16 U/L (ref 15–41)
Albumin: 3.7 g/dL (ref 3.5–5.0)
Alkaline Phosphatase: 66 U/L (ref 38–126)
Anion gap: 6 (ref 5–15)
BUN: 18 mg/dL (ref 8–23)
CO2: 40 mmol/L — ABNORMAL HIGH (ref 22–32)
Calcium: 8.6 mg/dL — ABNORMAL LOW (ref 8.9–10.3)
Chloride: 96 mmol/L — ABNORMAL LOW (ref 98–111)
Creatinine, Ser: 0.54 mg/dL (ref 0.44–1.00)
GFR, Estimated: >60 mL/min (ref >60)
Glucose, Bld: 125 mg/dL — ABNORMAL HIGH (ref 70–99)
Potassium: 3.9 mmol/L (ref 3.5–5.1)
Sodium: 142 mmol/L (ref 135–145)
Total Bilirubin: 0.4 mg/dL (ref 0.3–1.2)
Total Protein: 6.7 g/dL (ref 6.5–8.1)

## 2021-02-18 LAB — CBC
HCT: 39.5 % (ref 36.0–46.0)
Hemoglobin: 11.7 g/dL — ABNORMAL LOW (ref 12.0–15.0)
MCH: 26.9 pg (ref 26.0–34.0)
MCHC: 29.6 g/dL — ABNORMAL LOW (ref 30.0–36.0)
MCV: 90.8 fL (ref 80.0–100.0)
Platelets: 216 10*3/uL (ref 150–400)
RBC: 4.35 MIL/uL (ref 3.87–5.11)
RDW: 15 % (ref 11.5–15.5)
WBC: 6.7 10*3/uL (ref 4.0–10.5)
nRBC: 0 % (ref 0.0–0.2)

## 2021-02-18 MED ORDER — AZITHROMYCIN 250 MG PO TABS
250.0000 mg | ORAL_TABLET | Freq: Every day | ORAL | 0 refills | Status: DC
Start: 2021-02-18 — End: 2021-02-22

## 2021-02-18 MED ORDER — HYDROCHLOROTHIAZIDE 25 MG PO TABS: 25.0000 mg | ORAL_TABLET | Freq: Every day | ORAL | 0 refills | Status: DC

## 2021-02-18 MED ORDER — IPRATROPIUM-ALBUTEROL 0.5-2.5 (3) MG/3ML IN SOLN
3.0000 mL | Freq: Once | RESPIRATORY_TRACT | Status: AC
  Administered 2021-02-18: 3 mL via RESPIRATORY_TRACT
  Filled 2021-02-18: qty 3

## 2021-02-18 MED ORDER — ALBUTEROL SULFATE HFA 108 (90 BASE) MCG/ACT IN AERS
2.0000 | INHALATION_SPRAY | RESPIRATORY_TRACT | Status: DC | PRN
  Filled 2021-02-18: qty 6.7

## 2021-02-18 MED ORDER — METHYLPREDNISOLONE SODIUM SUCC 125 MG IJ SOLR
80.0000 mg | Freq: Once | INTRAMUSCULAR | Status: AC
  Administered 2021-02-18: 80 mg via INTRAVENOUS
  Filled 2021-02-18: qty 2

## 2021-02-18 MED ORDER — PREDNISONE 10 MG PO TABS: 20.0000 mg | ORAL_TABLET | Freq: Two times a day (BID) | ORAL | 0 refills | Status: DC

## 2021-02-18 MED ORDER — AZITHROMYCIN 250 MG PO TABS
500.0000 mg | ORAL_TABLET | Freq: Once | ORAL | Status: AC
  Administered 2021-02-18: 500 mg via ORAL
  Filled 2021-02-18: qty 2

## 2021-02-18 NOTE — Discharge Instructions (Addendum)
Begin taking Zithromax and prednisone as prescribed.  Begin taking hydrochlorothiazide as prescribed for swelling of your legs.  Follow-up with primary doctor in the next week, and return to the ER if symptoms significantly worsen or change.

## 2021-02-18 NOTE — ED Triage Notes (Signed)
Pt c/o SOB that started today. Pt states she feels like she has fleam stuck in the throat that she can't get up. Pt on 2L [place] chronically at home.

## 2021-02-18 NOTE — ED Provider Notes (Signed)
[facility] EMERGENCY DEPARTMENT Provider Note   CSN: [id number] Arrival date & time: 02/17/21  2327     History Chief Complaint  Patient presents with   Shortness of [place] is a 75 y.o. female.  Patient is a 75 year old female with past medical history of COPD on home oxygen, hypertension, anxiety, arthritis.  Patient presenting today for evaluation of shortness of breath.  This has worsened over the past 2 days.  She denies fevers, chills, chest pain.  She does report occasional nonproductive cough.  The history is provided by the patient.  Shortness of Breath Severity:  Moderate Onset quality:  Gradual Duration:  2 days Timing:  Constant Progression:  Worsening Chronicity:  Recurrent Relieved by:  Nothing Worsened by:  Nothing Ineffective treatments:  Inhaler     Past Medical History:  Diagnosis Date   Anemia    Anxiety    Arthritis    C. difficile diarrhea 10/08/2019   Chronic back pain    COPD (chronic obstructive pulmonary disease) (HCC)    GERD (gastroesophageal reflux disease)    HCAP (healthcare-associated pneumonia) 06/28/2017   Hypertension    Hypothyroidism    On home oxygen therapy    2L   Pneumonia due to COVID-19 virus 09/28/2019   Reflux    no medications currently, asymptomatic   Thyroid disease     Patient Active Problem List   Diagnosis Date Noted   Complete paralysis of right vocal cord 08/27/2020   Malignant neoplasm of thyroid gland ([place]) 07/08/2020   Other chronic pain 06/17/2020   Multinodular goiter 06/10/2020   Neck mass 06/03/2020   Hearing abnormally acute, right 06/03/2020   Chronic respiratory failure with hypoxia and hypercapnia ([place]) 05/11/2020   Irritable bowel syndrome with atypical cp  05/11/2020   Acute kidney injury superimposed on CKD ([place]) 01/23/2020   Hypertension 06/02/2018   Positive colorectal cancer screening using Cologuard test 08/21/2017   Normocytic anemia    Anxiety 06/30/2017    Hypothyroidism 06/30/2017   GERD (gastroesophageal reflux disease) 06/30/2017   COPD GOLD IV  06/30/2017   Elevated liver enzymes 06/30/2017   Severe Iron deficiency anemia 06/30/2017   Second degree hemorrhoids     Past Surgical History:  Procedure Laterality Date   ABDOMINAL HYSTERECTOMY     BIOPSY  10/08/2017   Procedure: BIOPSY;  Surgeon: Daneil Dolin, MD;  [facility];  Service: Endoscopy;;  gastric   cataracts Bilateral    COLONOSCOPY  2007   Dr. Gala Romney: normal rectum, diverticula    COLONOSCOPY WITH PROPOFOL N/A 11/15/2015   Dr. Gala Romney: grade II hemorrhoids, diverticulosis   COLONOSCOPY WITH PROPOFOL N/A 10/08/2017   Rourk: Diverticulosis   ESOPHAGOGASTRODUODENOSCOPY (EGD) WITH PROPOFOL N/A 10/08/2017   Rourk: Nonobstructing Schatzki ring, mild erosive reflux esophagitis, small hiatal hernia, gastritis but no H. pylori.   FRACTURE SURGERY Right 2005   Wrist   JOINT REPLACEMENT Left 2000   hip   RADICAL NECK DISSECTION Right 09/13/2020   Procedure: RADICAL NECK DISSECTION;  Surgeon: Melida Quitter, MD;  Location: [place];  Service: ENT;  Laterality: Right;   THYROIDECTOMY N/A 09/13/2020   Procedure: TOTAL THYROIDECTOMY;  Surgeon: Melida Quitter, MD;  Location: [place];  Service: ENT;  Laterality: N/A;   TOTAL HIP ARTHROPLASTY Left    WRIST SURGERY Right      OB History   No obstetric history on file.     Family History  Problem Relation Age of  Onset   Bone cancer Sister    Pancreatic cancer Sister    Pneumonia Mother    Heart attack Father    Diabetes Sister    Dementia Sister    Colon cancer Neg Hx     Social History   Tobacco Use   Smoking status: Former    Packs/day: 25.00    Years: 1.00    Pack years: 25.00    Types: Cigarettes    Quit date: 08/27/1998    Years since quitting: 22.4   Smokeless tobacco: Never   Tobacco comments:    quit in 2000 Verified 01/28/21 MR   Vaping Use   Vaping Use: Never used  Substance Use Topics   Alcohol use: No     Alcohol/week: 0.0 standard drinks   Drug use: No    Home Medications Prior to Admission medications   Medication Sig Start Date End Date Taking? Authorizing Provider  albuterol (PROVENTIL) (2.5 MG/3ML) 0.083% nebulizer solution Take 3 mLs (2.5 mg total) by nebulization every 6 (six) hours as needed for wheezing or shortness of breath. 12/14/20   Tanda Rockers, MD  albuterol (VENTOLIN HFA) 108 (90 Base) MCG/ACT inhaler Inhale 1-2 puffs into the lungs every 6 (six) hours as needed for wheezing or shortness of breath. 09/02/20   Lindell Spar, MD  ALPRAZolam Duanne Moron) 1 MG tablet Take 1 tablet (1 mg total) by mouth every 8 (eight) hours as needed for anxiety. 01/17/21   Lindell Spar, MD  amLODipine (NORVASC) 5 MG tablet TAKE 1 TABLET BY MOUTH DAILY. 09/23/20   Lindell Spar, MD  azithromycin (ZITHROMAX) 250 MG tablet Take 1 tablet (250 mg total) by mouth daily. 02/12/21   [name], MD  ibuprofen (ADVIL) 200 MG tablet Take 400 mg by mouth every 8 (eight) hours as needed (pain).    [provider]  levothyroxine (SYNTHROID) 75 MCG tablet Take 1 tablet (75 mcg total) by mouth daily. 01/06/21   Lindell Spar, MD  oxyCODONE (ROXICODONE) 15 MG immediate release tablet TAKE (1) TABLET BY MOUTH EVERY 8 HOURS AS NEEDED. 02/16/21   Lindell Spar, MD  pantoprazole (PROTONIX) 40 MG tablet TAKE 1 TABLET BY MOUTH DAILY. Patient taking differently: Take 40 mg by mouth daily. 03/23/20   Mahala Menghini, PA-C  predniSONE (DELTASONE) 10 MG tablet Take 2 tablets (20 mg total) by mouth 2 (two) times daily. 02/12/21   [name], MD  promethazine-dextromethorphan (PROMETHAZINE-DM) 6.25-15 MG/5ML syrup Take 5 mLs by mouth 4 (four) times daily as needed for cough. 01/06/21   Lindell Spar, MD  Tiotropium Bromide-Olodaterol (STIOLTO RESPIMAT) 2.5-2.5 MCG/ACT AERS Inhale 2 puffs into the lungs daily. 01/28/21   Tanda Rockers, MD  Tiotropium Bromide-Olodaterol (STIOLTO RESPIMAT) 2.5-2.5 MCG/ACT AERS  Inhale 2 puffs into the lungs daily for 28 doses. 01/28/21 02/25/21  Tanda Rockers, MD  Vitamin D, Ergocalciferol, (DRISDOL) 1.25 MG (50000 UNIT) CAPS capsule Take 50,000 Units by mouth every Sunday.    [provider]    Allergies    Penicillins and Vicodin [hydrocodone-acetaminophen]  Review of Systems   Review of Systems  Respiratory:  Positive for shortness of breath.   All other systems reviewed and are negative.  Physical Exam Updated Vital Signs BP (!) 100/56   Pulse 82   Temp 98 F (36.7 C) (Oral)   Resp (!) 24   Ht 5\' 5"  (1.651 m)   Wt 45 kg   SpO2 100%   BMI  16.51 kg/m   Physical Exam Vitals and nursing note reviewed.  Constitutional:      General: She is not in acute distress.    Appearance: She is well-developed. She is not diaphoretic.  HENT:     Head: Normocephalic and atraumatic.  Cardiovascular:     Rate and Rhythm: Normal rate and regular rhythm.     Heart sounds: No murmur heard.   No friction rub. No gallop.  Pulmonary:     Effort: Pulmonary effort is normal. No respiratory distress.     Breath sounds: Normal breath sounds. No wheezing.  Abdominal:     General: Bowel sounds are normal. There is no distension.     Palpations: Abdomen is soft.     Tenderness: There is no abdominal tenderness.  Musculoskeletal:        General: Normal range of motion.     Cervical back: Normal range of motion and neck supple.     Right lower leg: No tenderness. Edema present.     Left lower leg: No tenderness. Edema present.     Comments: There is trace edema of both lower extremities.  Skin:    General: Skin is warm and dry.  Neurological:     General: No focal deficit present.     Mental Status: She is alert and oriented to person, place, and time.    ED Results / Procedures / Treatments   Labs (all labs ordered are listed, but only abnormal results are displayed) Labs Reviewed  CBC - Abnormal; Notable for the following components:      Result  Value   Hemoglobin 11.7 (*)    MCHC 29.6 (*)    All other components within normal limits  COMPREHENSIVE METABOLIC PANEL - Abnormal; Notable for the following components:   Chloride 96 (*)    CO2 40 (*)    Glucose, Bld 125 (*)    Calcium 8.6 (*)    All other components within normal limits    EKG None  Radiology DG Chest 2 View  Result Date: 02/18/2021 CLINICAL DATA:  Shortness of breath. EXAM: CHEST - 2 VIEW COMPARISON:  Chest radiograph dated 02/12/2021 FINDINGS: Background of emphysema. Biapical subpleural scarring. No focal consolidation, pleural effusion or pneumothorax the cardiac silhouette is within limits. No acute osseous pathology. IMPRESSION: No active cardiopulmonary disease. Emphysema. Electronically Signed   By: Anner Crete M.D.   On: 02/18/2021 00:52    Procedures Procedures   Medications Ordered in ED Medications  albuterol (VENTOLIN HFA) 108 (90 Base) MCG/ACT inhaler 2 puff (has no administration in time range)  methylPREDNISolone sodium succinate (SOLU-MEDROL) 125 mg/2 mL injection 80 mg (has no administration in time range)  ipratropium-albuterol (DUONEB) 0.5-2.5 (3) MG/3ML nebulizer solution 3 mL (has no administration in time range)  ipratropium-albuterol (DUONEB) 0.5-2.5 (3) MG/3ML nebulizer solution 3 mL (has no administration in time range)    ED Course  I have reviewed the triage vital signs and the nursing notes.  Pertinent labs & imaging results that were available during my care of the patient were reviewed by me and considered in my medical decision making (see chart for details).    MDM Rules/Calculators/A&P  Patient with history of COPD on home oxygen presenting with complaints of shortness of breath.  This appears to be related to a COPD exacerbation.  Patient has received duo nebs and steroids and is feeling somewhat better.  She will be discharged with prednisone and Zithromax and as needed return.  Final  Clinical Impression(s) / ED  Diagnoses Final diagnoses:  None    Rx / DC Orders ED Discharge Orders     None        [name], MD 02/18/21 [phone]

## 2021-02-21 ENCOUNTER — Other Ambulatory Visit: Payer: Self-pay

## 2021-02-21 ENCOUNTER — Encounter (HOSPITAL_COMMUNITY): Payer: Self-pay

## 2021-02-21 ENCOUNTER — Observation Stay (HOSPITAL_COMMUNITY)
Admission: EM | Admit: 2021-02-21 | Discharge: 2021-02-22 | Disposition: A | Discharge status: Home / Self Care | Payer: Medicare Other | Attending: Family Medicine | Admitting: Family Medicine

## 2021-02-21 ENCOUNTER — Emergency Department (HOSPITAL_COMMUNITY): Payer: Medicare Other

## 2021-02-21 DIAGNOSIS — U071 COVID-19: Principal | ICD-10-CM

## 2021-02-21 DIAGNOSIS — I10 Essential (primary) hypertension: Secondary | ICD-10-CM | POA: Diagnosis not present

## 2021-02-21 DIAGNOSIS — J441 Chronic obstructive pulmonary disease with (acute) exacerbation: Secondary | ICD-10-CM

## 2021-02-21 DIAGNOSIS — Z85850 Personal history of malignant neoplasm of thyroid: Secondary | ICD-10-CM | POA: Diagnosis not present

## 2021-02-21 DIAGNOSIS — J449 Chronic obstructive pulmonary disease, unspecified: Secondary | ICD-10-CM | POA: Insufficient documentation

## 2021-02-21 DIAGNOSIS — Z96642 Presence of left artificial hip joint: Secondary | ICD-10-CM | POA: Diagnosis not present

## 2021-02-21 DIAGNOSIS — Z79899 Other long term (current) drug therapy: Secondary | ICD-10-CM | POA: Diagnosis not present

## 2021-02-21 DIAGNOSIS — R197 Diarrhea, unspecified: Secondary | ICD-10-CM | POA: Insufficient documentation

## 2021-02-21 DIAGNOSIS — Z87891 Personal history of nicotine dependence: Secondary | ICD-10-CM | POA: Insufficient documentation

## 2021-02-21 DIAGNOSIS — Z23 Encounter for immunization: Secondary | ICD-10-CM | POA: Diagnosis not present

## 2021-02-21 DIAGNOSIS — E039 Hypothyroidism, unspecified: Secondary | ICD-10-CM | POA: Insufficient documentation

## 2021-02-21 DIAGNOSIS — R531 Weakness: Secondary | ICD-10-CM | POA: Diagnosis not present

## 2021-02-21 DIAGNOSIS — R0600 Dyspnea, unspecified: Secondary | ICD-10-CM | POA: Diagnosis not present

## 2021-02-21 DIAGNOSIS — R079 Chest pain, unspecified: Secondary | ICD-10-CM | POA: Diagnosis not present

## 2021-02-21 DIAGNOSIS — R0602 Shortness of breath: Secondary | ICD-10-CM | POA: Diagnosis not present

## 2021-02-21 DIAGNOSIS — R059 Cough, unspecified: Secondary | ICD-10-CM | POA: Diagnosis not present

## 2021-02-21 DIAGNOSIS — C73 Malignant neoplasm of thyroid gland: Secondary | ICD-10-CM | POA: Diagnosis not present

## 2021-02-21 LAB — TROPONIN I (HIGH SENSITIVITY)
Troponin I (High Sensitivity): 6 ng/L (ref <18)
Troponin I (High Sensitivity): 9 ng/L (ref <18)

## 2021-02-21 LAB — CBC WITH DIFFERENTIAL/PLATELET
Abs Immature Granulocytes: 0.03 10*3/uL (ref 0.00–0.07)
Basophils Absolute: 0 10*3/uL (ref 0.0–0.1)
Basophils Relative: 0 %
Eosinophils Absolute: 0 10*3/uL (ref 0.0–0.5)
Eosinophils Relative: 0 %
HCT: 38.5 % (ref 36.0–46.0)
Hemoglobin: 11.4 g/dL — ABNORMAL LOW (ref 12.0–15.0)
Immature Granulocytes: 0 %
Lymphocytes Relative: 7 %
Lymphs Abs: 0.5 10*3/uL — ABNORMAL LOW (ref 0.7–4.0)
MCH: 26.6 pg (ref 26.0–34.0)
MCHC: 29.6 g/dL — ABNORMAL LOW (ref 30.0–36.0)
MCV: 90 fL (ref 80.0–100.0)
Monocytes Absolute: 0.6 10*3/uL (ref 0.1–1.0)
Monocytes Relative: 8 %
Neutro Abs: 6.7 10*3/uL (ref 1.7–7.7)
Neutrophils Relative %: 85 %
Platelets: 236 10*3/uL (ref 150–400)
RBC: 4.28 MIL/uL (ref 3.87–5.11)
RDW: 15.2 % (ref 11.5–15.5)
WBC: 7.9 10*3/uL (ref 4.0–10.5)
nRBC: 0 % (ref 0.0–0.2)

## 2021-02-21 LAB — COMPREHENSIVE METABOLIC PANEL
ALT: 18 U/L (ref 0–44)
AST: 16 U/L (ref 15–41)
Albumin: 3.5 g/dL (ref 3.5–5.0)
Alkaline Phosphatase: 71 U/L (ref 38–126)
Anion gap: 8 (ref 5–15)
BUN: 23 mg/dL (ref 8–23)
CO2: 37 mmol/L — ABNORMAL HIGH (ref 22–32)
Calcium: 8.8 mg/dL — ABNORMAL LOW (ref 8.9–10.3)
Chloride: 94 mmol/L — ABNORMAL LOW (ref 98–111)
Creatinine, Ser: 0.7 mg/dL (ref 0.44–1.00)
GFR, Estimated: >60 mL/min (ref >60)
Glucose, Bld: 149 mg/dL — ABNORMAL HIGH (ref 70–99)
Potassium: 3.8 mmol/L (ref 3.5–5.1)
Sodium: 139 mmol/L (ref 135–145)
Total Bilirubin: 0.4 mg/dL (ref 0.3–1.2)
Total Protein: 6.2 g/dL — ABNORMAL LOW (ref 6.5–8.1)

## 2021-02-21 LAB — RESP PANEL BY RT-PCR (FLU A&B, COVID) ARPGX2
Influenza A by PCR: NEGATIVE
Influenza B by PCR: NEGATIVE
SARS Coronavirus 2 by RT PCR: POSITIVE — AB

## 2021-02-21 LAB — MAGNESIUM: Magnesium: 2.2 mg/dL (ref 1.7–2.4)

## 2021-02-21 MED ORDER — ENOXAPARIN SODIUM 30 MG/0.3ML IJ SOSY
30.0000 mg | PREFILLED_SYRINGE | INTRAMUSCULAR | Status: DC
  Administered 2021-02-22: 30 mg via SUBCUTANEOUS
  Filled 2021-02-21: qty 0.3

## 2021-02-21 MED ORDER — LACTATED RINGERS IV BOLUS
1000.0000 mL | Freq: Once | INTRAVENOUS | Status: AC
  Administered 2021-02-21: 1000 mL via INTRAVENOUS

## 2021-02-21 MED ORDER — POLYETHYLENE GLYCOL 3350 17 G PO PACK: 17.0000 g | PACK | Freq: Every day | ORAL | Status: DC | PRN

## 2021-02-21 MED ORDER — ONDANSETRON HCL 4 MG/2ML IJ SOLN: 4.0000 mg | Freq: Four times a day (QID) | INTRAMUSCULAR | Status: DC | PRN

## 2021-02-21 MED ORDER — GUAIFENESIN-DM 100-10 MG/5ML PO SYRP: 10.0000 mL | ORAL_SOLUTION | ORAL | Status: DC | PRN

## 2021-02-21 MED ORDER — ALBUTEROL SULFATE (2.5 MG/3ML) 0.083% IN NEBU: 2.5000 mg | INHALATION_SOLUTION | Freq: Four times a day (QID) | RESPIRATORY_TRACT | Status: DC

## 2021-02-21 MED ORDER — SODIUM CHLORIDE 0.9 % IV SOLN
100.0000 mg | Freq: Every day | INTRAVENOUS | Status: DC
  Administered 2021-02-22: 100 mg via INTRAVENOUS
  Filled 2021-02-21: qty 20

## 2021-02-21 MED ORDER — METHYLPREDNISOLONE SODIUM SUCC 125 MG IJ SOLR
42.0000 mg | INTRAMUSCULAR | Status: DC
  Administered 2021-02-22: 42 mg via INTRAVENOUS
  Filled 2021-02-21: qty 2

## 2021-02-21 MED ORDER — ALBUTEROL SULFATE (2.5 MG/3ML) 0.083% IN NEBU: 2.5000 mg | INHALATION_SOLUTION | Freq: Four times a day (QID) | RESPIRATORY_TRACT | Status: DC | PRN

## 2021-02-21 MED ORDER — ALBUTEROL SULFATE (2.5 MG/3ML) 0.083% IN NEBU
5.0000 mg | INHALATION_SOLUTION | Freq: Once | RESPIRATORY_TRACT | Status: AC
  Administered 2021-02-21: 5 mg via RESPIRATORY_TRACT
  Filled 2021-02-21: qty 6

## 2021-02-21 MED ORDER — ACETAMINOPHEN 325 MG PO TABS: 650.0000 mg | ORAL_TABLET | Freq: Four times a day (QID) | ORAL | Status: DC | PRN

## 2021-02-21 MED ORDER — IPRATROPIUM BROMIDE 0.02 % IN SOLN
0.5000 mg | Freq: Once | RESPIRATORY_TRACT | Status: AC
  Administered 2021-02-21: 0.5 mg via RESPIRATORY_TRACT
  Filled 2021-02-21: qty 2.5

## 2021-02-21 MED ORDER — ASPIRIN 81 MG PO CHEW
324.0000 mg | CHEWABLE_TABLET | Freq: Once | ORAL | Status: AC
  Administered 2021-02-21: 324 mg via ORAL
  Filled 2021-02-21: qty 4

## 2021-02-21 MED ORDER — ONDANSETRON HCL 4 MG PO TABS: 4.0000 mg | ORAL_TABLET | Freq: Four times a day (QID) | ORAL | Status: DC | PRN

## 2021-02-21 MED ORDER — ALBUTEROL SULFATE HFA 108 (90 BASE) MCG/ACT IN AERS
2.0000 | INHALATION_SPRAY | Freq: Once | RESPIRATORY_TRACT | Status: AC
  Administered 2021-02-21: 2 via RESPIRATORY_TRACT
  Filled 2021-02-21: qty 6.7

## 2021-02-21 MED ORDER — METHYLPREDNISOLONE SODIUM SUCC 125 MG IJ SOLR
60.0000 mg | Freq: Once | INTRAMUSCULAR | Status: AC
  Administered 2021-02-21: 60 mg via INTRAVENOUS
  Filled 2021-02-21: qty 2

## 2021-02-21 MED ORDER — LEVOTHYROXINE SODIUM 75 MCG PO TABS
75.0000 ug | ORAL_TABLET | Freq: Every day | ORAL | Status: DC
  Administered 2021-02-22: 75 ug via ORAL
  Filled 2021-02-21: qty 1

## 2021-02-21 MED ORDER — REMDESIVIR 100 MG IV SOLR
100.0000 mg | INTRAVENOUS | Status: AC
Start: 2021-02-21 — End: 2021-02-21
  Administered 2021-02-21 (×2): 100 mg via INTRAVENOUS
  Filled 2021-02-21 (×2): qty 20

## 2021-02-21 MED ORDER — ENOXAPARIN SODIUM 40 MG/0.4ML IJ SOSY: 40.0000 mg | PREFILLED_SYRINGE | INTRAMUSCULAR | Status: DC

## 2021-02-21 MED ORDER — ACETAMINOPHEN 650 MG RE SUPP: 650.0000 mg | Freq: Four times a day (QID) | RECTAL | Status: DC | PRN

## 2021-02-21 MED ORDER — GUAIFENESIN ER 600 MG PO TB12
600.0000 mg | ORAL_TABLET | Freq: Two times a day (BID) | ORAL | Status: DC
  Filled 2021-02-21 (×2): qty 1

## 2021-02-21 MED ORDER — PREDNISONE 20 MG PO TABS: 50.0000 mg | ORAL_TABLET | Freq: Every day | ORAL | Status: DC

## 2021-02-21 MED ORDER — PANTOPRAZOLE SODIUM 40 MG PO TBEC
40.0000 mg | DELAYED_RELEASE_TABLET | Freq: Every day | ORAL | Status: DC
  Administered 2021-02-22: 40 mg via ORAL
  Filled 2021-02-21: qty 1

## 2021-02-21 MED ORDER — HYDROCOD POLST-CPM POLST ER 10-8 MG/5ML PO SUER: 5.0000 mL | Freq: Two times a day (BID) | ORAL | Status: DC | PRN

## 2021-02-21 MED ORDER — ALBUTEROL SULFATE (2.5 MG/3ML) 0.083% IN NEBU: 2.5000 mg | INHALATION_SOLUTION | RESPIRATORY_TRACT | Status: DC | PRN

## 2021-02-21 NOTE — ED Triage Notes (Signed)
Pt presents to ED with increased SOB started today. Pt chronically on 2L per [place]

## 2021-02-21 NOTE — ED Notes (Signed)
Date and time results received: 02/21/21 2:51 PM  (use smartphrase ".now" to insert current time)  Test: COVID Critical Value: +  Name of Provider Notified: Dr. Regenia Skeeter  Orders Received? Or Actions Taken?: see chart

## 2021-02-21 NOTE — ED Provider Notes (Signed)
[facility] EMERGENCY DEPARTMENT Provider Note   CSN: [id number] Arrival date & time: 02/21/21  1146     History Chief Complaint  Patient presents with   Shortness of [place] is a 75 y.o. female.  HPI 75 year old female presents with weakness and shortness of breath.  The weakness has been ongoing for a few days she is having a hard time walking around because of how weak she is diffusely.  She is also has shortness of breath and chest tightness starting today.  Some nonproductive cough.  Chest feels very tight.  She has used her albuterol with maybe some partial relief.  No fevers.  No leg swelling.  She has chronic diarrhea that is unchanged.  Past Medical History:  Diagnosis Date   Anemia    Anxiety    Arthritis    C. difficile diarrhea 10/08/2019   Chronic back pain    COPD (chronic obstructive pulmonary disease) (HCC)    GERD (gastroesophageal reflux disease)    HCAP (healthcare-associated pneumonia) 06/28/2017   Hypertension    Hypothyroidism    On home oxygen therapy    2L   Pneumonia due to COVID-19 virus 09/28/2019   Reflux    no medications currently, asymptomatic   Thyroid disease     Patient Active Problem List   Diagnosis Date Noted   Complete paralysis of right vocal cord 08/27/2020   Malignant neoplasm of thyroid gland ([place]) 07/08/2020   Other chronic pain 06/17/2020   Multinodular goiter 06/10/2020   Neck mass 06/03/2020   Hearing abnormally acute, right 06/03/2020   Chronic respiratory failure with hypoxia and hypercapnia ([place]) 05/11/2020   Irritable bowel syndrome with atypical cp  05/11/2020   Acute kidney injury superimposed on CKD ([place]) 01/23/2020   Hypertension 06/02/2018   Positive colorectal cancer screening using Cologuard test 08/21/2017   Normocytic anemia    Anxiety 06/30/2017   Hypothyroidism 06/30/2017   GERD (gastroesophageal reflux disease) 06/30/2017   COPD GOLD IV  06/30/2017   Elevated liver enzymes  06/30/2017   Severe Iron deficiency anemia 06/30/2017   Second degree hemorrhoids     Past Surgical History:  Procedure Laterality Date   ABDOMINAL HYSTERECTOMY     BIOPSY  10/08/2017   Procedure: BIOPSY;  Surgeon: Daneil Dolin, MD;  [facility];  Service: Endoscopy;;  gastric   cataracts Bilateral    COLONOSCOPY  2007   Dr. Gala Romney: normal rectum, diverticula    COLONOSCOPY WITH PROPOFOL N/A 11/15/2015   Dr. Gala Romney: grade II hemorrhoids, diverticulosis   COLONOSCOPY WITH PROPOFOL N/A 10/08/2017   Rourk: Diverticulosis   ESOPHAGOGASTRODUODENOSCOPY (EGD) WITH PROPOFOL N/A 10/08/2017   Rourk: Nonobstructing Schatzki ring, mild erosive reflux esophagitis, small hiatal hernia, gastritis but no H. pylori.   FRACTURE SURGERY Right 2005   Wrist   JOINT REPLACEMENT Left 2000   hip   RADICAL NECK DISSECTION Right 09/13/2020   Procedure: RADICAL NECK DISSECTION;  Surgeon: Melida Quitter, MD;  Location: [place];  Service: ENT;  Laterality: Right;   THYROIDECTOMY N/A 09/13/2020   Procedure: TOTAL THYROIDECTOMY;  Surgeon: Melida Quitter, MD;  Location: [place];  Service: ENT;  Laterality: N/A;   TOTAL HIP ARTHROPLASTY Left    WRIST SURGERY Right      OB History   No obstetric history on file.     Family History  Problem Relation Age of Onset   Bone cancer Sister    Pancreatic cancer Sister  Pneumonia Mother    Heart attack Father    Diabetes Sister    Dementia Sister    Colon cancer Neg Hx     Social History   Tobacco Use   Smoking status: Former    Packs/day: 25.00    Years: 1.00    Pack years: 25.00    Types: Cigarettes    Quit date: 08/27/1998    Years since quitting: 22.5   Smokeless tobacco: Never   Tobacco comments:    quit in 2000 Verified 01/28/21 MR   Vaping Use   Vaping Use: Never used  Substance Use Topics   Alcohol use: No    Alcohol/week: 0.0 standard drinks   Drug use: No    Home Medications Prior to Admission medications   Medication Sig Start  Date End Date Taking? Authorizing Provider  albuterol (PROVENTIL) (2.5 MG/3ML) 0.083% nebulizer solution Take 3 mLs (2.5 mg total) by nebulization every 6 (six) hours as needed for wheezing or shortness of breath. 12/14/20   Tanda Rockers, MD  albuterol (VENTOLIN HFA) 108 (90 Base) MCG/ACT inhaler Inhale 1-2 puffs into the lungs every 6 (six) hours as needed for wheezing or shortness of breath. 09/02/20   Lindell Spar, MD  ALPRAZolam Duanne Moron) 1 MG tablet Take 1 tablet (1 mg total) by mouth every 8 (eight) hours as needed for anxiety. 01/17/21   Lindell Spar, MD  amLODipine (NORVASC) 5 MG tablet TAKE 1 TABLET BY MOUTH DAILY. 09/23/20   Lindell Spar, MD  azithromycin (ZITHROMAX) 250 MG tablet Take 1 tablet (250 mg total) by mouth daily. 02/18/21   [name], MD  hydrochlorothiazide (HYDRODIURIL) 25 MG tablet Take 1 tablet (25 mg total) by mouth daily. 02/18/21   [name], MD  ibuprofen (ADVIL) 200 MG tablet Take 400 mg by mouth every 8 (eight) hours as needed (pain).    [provider]  levothyroxine (SYNTHROID) 75 MCG tablet Take 1 tablet (75 mcg total) by mouth daily. 01/06/21   Lindell Spar, MD  oxyCODONE (ROXICODONE) 15 MG immediate release tablet TAKE (1) TABLET BY MOUTH EVERY 8 HOURS AS NEEDED. 02/16/21   Lindell Spar, MD  pantoprazole (PROTONIX) 40 MG tablet TAKE 1 TABLET BY MOUTH DAILY. Patient taking differently: Take 40 mg by mouth daily. 03/23/20   Mahala Menghini, PA-C  predniSONE (DELTASONE) 10 MG tablet Take 2 tablets (20 mg total) by mouth 2 (two) times daily. 02/18/21   [name], MD  promethazine-dextromethorphan (PROMETHAZINE-DM) 6.25-15 MG/5ML syrup Take 5 mLs by mouth 4 (four) times daily as needed for cough. 01/06/21   Lindell Spar, MD  Tiotropium Bromide-Olodaterol (STIOLTO RESPIMAT) 2.5-2.5 MCG/ACT AERS Inhale 2 puffs into the lungs daily. 01/28/21   Tanda Rockers, MD  Tiotropium Bromide-Olodaterol (STIOLTO RESPIMAT) 2.5-2.5 MCG/ACT AERS Inhale 2  puffs into the lungs daily for 28 doses. 01/28/21 02/25/21  Tanda Rockers, MD  Vitamin D, Ergocalciferol, (DRISDOL) 1.25 MG (50000 UNIT) CAPS capsule Take 50,000 Units by mouth every Sunday.    [provider]    Allergies    Penicillins and Vicodin [hydrocodone-acetaminophen]  Review of Systems   Review of Systems  Constitutional:  Negative for fever.  Respiratory:  Positive for cough, chest tightness and shortness of breath.   Cardiovascular:  Negative for chest pain and leg swelling.  Gastrointestinal:  Positive for diarrhea.  Neurological:  Positive for weakness.  All other systems reviewed and are negative.  Physical Exam Updated Vital Signs BP Marland Kitchen)  182/104   Pulse (!) 123   Temp 97.6 F (36.4 C) (Oral)   Resp (!) 25   Ht 5\' 5"  (1.651 m)   Wt 42.2 kg   SpO2 98%   BMI 15.48 kg/m   Physical Exam Vitals and nursing note reviewed.  Constitutional:      General: She is not in acute distress.    Appearance: She is well-developed and underweight. She is not ill-appearing or diaphoretic.  HENT:     Head: Normocephalic and atraumatic.     Right Ear: External ear normal.     Left Ear: External ear normal.     Nose: Nose normal.  Eyes:     General:        Right eye: No discharge.        Left eye: No discharge.  Cardiovascular:     Rate and Rhythm: Normal rate and regular rhythm.     Heart sounds: Normal heart sounds.  Pulmonary:     Effort: Pulmonary effort is normal. No tachypnea, accessory muscle usage or respiratory distress.     Breath sounds: Examination of the right-lower field reveals decreased breath sounds. Examination of the left-lower field reveals decreased breath sounds. Decreased breath sounds present. No wheezing.  Abdominal:     Palpations: Abdomen is soft.     Tenderness: There is no abdominal tenderness.  Musculoskeletal:     Right lower leg: No edema.     Left lower leg: No edema.  Skin:    General: Skin is warm and dry.  Neurological:      Mental Status: She is alert.  Psychiatric:        Mood and Affect: Mood is not anxious.    ED Results / Procedures / Treatments   Labs (all labs ordered are listed, but only abnormal results are displayed) Labs Reviewed  RESP PANEL BY RT-PCR (FLU A&B, COVID) ARPGX2 - Abnormal; Notable for the following components:      Result Value   SARS Coronavirus 2 by RT PCR POSITIVE (*)    All other components within normal limits  COMPREHENSIVE METABOLIC PANEL - Abnormal; Notable for the following components:   Chloride 94 (*)    CO2 37 (*)    Glucose, Bld 149 (*)    Calcium 8.8 (*)    Total Protein 6.2 (*)    All other components within normal limits  CBC WITH DIFFERENTIAL/PLATELET - Abnormal; Notable for the following components:   Hemoglobin 11.4 (*)    MCHC 29.6 (*)    Lymphs Abs 0.5 (*)    All other components within normal limits  MAGNESIUM  URINALYSIS, ROUTINE W REFLEX MICROSCOPIC  TROPONIN I (HIGH SENSITIVITY)  TROPONIN I (HIGH SENSITIVITY)    EKG EKG Interpretation  Date/Time:  Monday February 21 2021 12:07:40 EDT Ventricular Rate:  91 PR Interval:  106 QRS Duration: 135 QT Interval:  390 QTC Calculation: 480 R Axis:   82 Text Interpretation: Sinus rhythm Short PR interval Consider right atrial enlargement Left bundle branch block Baseline wander in lead(s) III aVL V1 Poor data quality - Please discard Confirmed by Sherwood Gambler [id number]) on 02/21/2021 1:07:55 PM  Radiology DG Chest Portable 1 View  Result Date: 02/21/2021 CLINICAL DATA:  Cough, dyspnea, chest pain EXAM: PORTABLE CHEST 1 VIEW COMPARISON:  02/18/2021 chest radiograph. FINDINGS: Stable cardiomediastinal silhouette with normal heart size. No pneumothorax. No pleural effusion. Hyperinflated lungs. Biapical pleural capping and biapical reticular opacities and distortion, right greater than left, unchanged. No  pulmonary edema. No acute consolidative airspace disease. IMPRESSION: 1. Hyperinflated lungs,  suggesting obstructive lung disease. 2. Stable biapical pleural capping and biapical reticular opacities and distortion, right greater than left, compatible with nonspecific fibrosis. Electronically Signed   By: Ilona Sorrel M.D.   On: 02/21/2021 13:58    Procedures Procedures   Medications Ordered in ED Medications  remdesivir 100 mg in sodium chloride 0.9 % 100 mL IVPB (100 mg Intravenous New Bag/Given 02/21/21 1556)  remdesivir 100 mg in sodium chloride 0.9 % 100 mL IVPB (has no administration in time range)  lactated ringers bolus 1,000 mL (0 mLs Intravenous Stopped 02/21/21 1411)  aspirin chewable tablet 324 mg (324 mg Oral Given 02/21/21 1327)  albuterol (VENTOLIN HFA) 108 (90 Base) MCG/ACT inhaler 2 puff (2 puffs Inhalation Given 02/21/21 1331)  methylPREDNISolone sodium succinate (SOLU-MEDROL) 125 mg/2 mL injection 60 mg (60 mg Intravenous Given 02/21/21 1411)  albuterol (PROVENTIL) (2.5 MG/3ML) 0.083% nebulizer solution 5 mg (5 mg Nebulization Given 02/21/21 1406)  ipratropium (ATROVENT) nebulizer solution 0.5 mg (0.5 mg Nebulization Given 02/21/21 1406)    ED Course  I have reviewed the triage vital signs and the nursing notes.  Pertinent labs & imaging results that were available during my care of the patient were reviewed by me and considered in my medical decision making (see chart for details).    MDM Rules/Calculators/A&P                           Patient with a COPD exacerbation but now also found to have COVID-19, likely the cause of such exacerbation.  She was mildly hypotensive which did improve with fluids.  I doubt this is truly sepsis but more likely due to decreased p.o. intake per her report.  We will give Solu-Medrol, remdesivir, and admit. Final Clinical Impression(s) / ED Diagnoses Final diagnoses:  COPD exacerbation ([place])  COVID-19 virus infection    Rx / DC Orders ED Discharge Orders     None        Sherwood Gambler, MD 02/21/21 1557

## 2021-02-21 NOTE — H&P (Signed)
History and Physical    Marilyn Solomon DOB: 1945-10-26 DOA: 02/21/2021  PCP: Lindell Spar, MD  Patient coming from: Home  I have personally briefly reviewed patient's old medical records in [place]  Chief Complaint: Shortness of breath  HPI: Marilyn Solomon is a 75 y.o. female with medical history significant for COPD, chronic respiratory failure on 2l/min home oxygen, hypertension and hypothyroidism.  She has been at her baseline until this morning when she started experiencing worsening shortness of breath which was pronounced today.  Patient was seen on 02/18/2021 for shortness of breath which improved until today.  She occasionally has nonproductive cough and general malaise.  She denies fever, orthopnea, chest pain, PND, nausea, vomiting, diarrhea, polyuria, polydipsia, headache or loss of consciousness.  ED Course: Initial vital signs are blood pressure 117/68, pulse 98, respiratory rate 22 and she was afebrile.  She had transient episodes of hypotension in the ED that responded to IV fluid challenge.  She tested positive for SARS-CoV-2 and chest radiography was negative for pneumonia.  Review of Systems: As per HPI otherwise ;  Negative except as highlighted in HPI above. Past Medical History:  Diagnosis Date   Anemia    Anxiety    Arthritis    C. difficile diarrhea 10/08/2019   Chronic back pain    COPD (chronic obstructive pulmonary disease) (HCC)    GERD (gastroesophageal reflux disease)    HCAP (healthcare-associated pneumonia) 06/28/2017   Hypertension    Hypothyroidism    On home oxygen therapy    2L   Pneumonia due to COVID-19 virus 09/28/2019   Reflux    no medications currently, asymptomatic   Thyroid disease     Past Surgical History:  Procedure Laterality Date   ABDOMINAL HYSTERECTOMY     BIOPSY  10/08/2017   Procedure: BIOPSY;  Surgeon: Daneil Dolin, MD;  Location: [facility];  Service: Endoscopy;;  gastric   cataracts  Bilateral    COLONOSCOPY  2007   Dr. Gala Romney: normal rectum, diverticula    COLONOSCOPY WITH PROPOFOL N/A 11/15/2015   Dr. Gala Romney: grade II hemorrhoids, diverticulosis   COLONOSCOPY WITH PROPOFOL N/A 10/08/2017   Rourk: Diverticulosis   ESOPHAGOGASTRODUODENOSCOPY (EGD) WITH PROPOFOL N/A 10/08/2017   Rourk: Nonobstructing Schatzki ring, mild erosive reflux esophagitis, small hiatal hernia, gastritis but no H. pylori.   FRACTURE SURGERY Right 2005   Wrist   JOINT REPLACEMENT Left 2000   hip   RADICAL NECK DISSECTION Right 09/13/2020   Procedure: RADICAL NECK DISSECTION;  Surgeon: Melida Quitter, MD;  Location: [facility];  Service: ENT;  Laterality: Right;   THYROIDECTOMY N/A 09/13/2020   Procedure: TOTAL THYROIDECTOMY;  Surgeon: Melida Quitter, MD;  Location: [place];  Service: ENT;  Laterality: N/A;   TOTAL HIP ARTHROPLASTY Left    WRIST SURGERY Right      reports that she quit smoking about 22 years ago. Her smoking use included cigarettes. She has a 25.00 pack-year smoking history. She has never used smokeless tobacco. She reports that she does not drink alcohol and does not use drugs.  Allergies  Allergen Reactions   Penicillins Itching     Tolerated Ancef Intraop 09/13/20   Vicodin [Hydrocodone-Acetaminophen] Nausea And Vomiting    Family History  Problem Relation Age of Onset   Bone cancer Sister    Pancreatic cancer Sister    Pneumonia Mother    Heart attack Father    Diabetes Sister    Dementia Sister  Colon cancer Neg Hx    Positive for heart disease  Prior to Admission medications   Medication Sig Start Date End Date Taking? Authorizing Provider  albuterol (PROVENTIL) (2.5 MG/3ML) 0.083% nebulizer solution Take 3 mLs (2.5 mg total) by nebulization every 6 (six) hours as needed for wheezing or shortness of breath. 12/14/20   Tanda Rockers, MD  albuterol (VENTOLIN HFA) 108 (90 Base) MCG/ACT inhaler Inhale 1-2 puffs into the lungs every 6 (six) hours as needed for wheezing or  shortness of breath. 09/02/20   Lindell Spar, MD  ALPRAZolam Duanne Moron) 1 MG tablet Take 1 tablet (1 mg total) by mouth every 8 (eight) hours as needed for anxiety. 01/17/21   Lindell Spar, MD  amLODipine (NORVASC) 5 MG tablet TAKE 1 TABLET BY MOUTH DAILY. 09/23/20   Lindell Spar, MD  azithromycin (ZITHROMAX) 250 MG tablet Take 1 tablet (250 mg total) by mouth daily. 02/18/21   [name], MD  hydrochlorothiazide (HYDRODIURIL) 25 MG tablet Take 1 tablet (25 mg total) by mouth daily. 02/18/21   [name], MD  ibuprofen (ADVIL) 200 MG tablet Take 400 mg by mouth every 8 (eight) hours as needed (pain).    [provider]  levothyroxine (SYNTHROID) 75 MCG tablet Take 1 tablet (75 mcg total) by mouth daily. 01/06/21   Lindell Spar, MD  oxyCODONE (ROXICODONE) 15 MG immediate release tablet TAKE (1) TABLET BY MOUTH EVERY 8 HOURS AS NEEDED. 02/16/21   Lindell Spar, MD  pantoprazole (PROTONIX) 40 MG tablet TAKE 1 TABLET BY MOUTH DAILY. Patient taking differently: Take 40 mg by mouth daily. 03/23/20   Mahala Menghini, PA-C  predniSONE (DELTASONE) 10 MG tablet Take 2 tablets (20 mg total) by mouth 2 (two) times daily. 02/18/21   [name], MD  promethazine-dextromethorphan (PROMETHAZINE-DM) 6.25-15 MG/5ML syrup Take 5 mLs by mouth 4 (four) times daily as needed for cough. 01/06/21   Lindell Spar, MD  Tiotropium Bromide-Olodaterol (STIOLTO RESPIMAT) 2.5-2.5 MCG/ACT AERS Inhale 2 puffs into the lungs daily. 01/28/21   Tanda Rockers, MD  Tiotropium Bromide-Olodaterol (STIOLTO RESPIMAT) 2.5-2.5 MCG/ACT AERS Inhale 2 puffs into the lungs daily for 28 doses. 01/28/21 02/25/21  Tanda Rockers, MD  Vitamin D, Ergocalciferol, (DRISDOL) 1.25 MG (50000 UNIT) CAPS capsule Take 50,000 Units by mouth every Sunday.    [provider]    Physical Exam: Vitals:   02/21/21 1700 02/21/21 1730 02/21/21 1930 02/21/21 2000  BP: (!) 151/74 (!) 156/83 (!) 150/79 137/78  Pulse: (!) 109 (!) 110 (!)  102 (!) 108  Resp: (!) 22  19 17   Temp:      TempSrc:      SpO2: 99% 100% 99% 99%  Weight:      Height:        Constitutional: NAD, calm, comfortable Vitals:   02/21/21 1700 02/21/21 1730 02/21/21 1930 02/21/21 2000  BP: (!) 151/74 (!) 156/83 (!) 150/79 137/78  Pulse: (!) 109 (!) 110 (!) 102 (!) 108  Resp: (!) 22  19 17   Temp:      TempSrc:      SpO2: 99% 100% 99% 99%  Weight:      Height:       Eyes: PERRL, lids and conjunctivae normal ENMT: Mucous membranes are moist. Posterior pharynx clear of any exudate or lesions.Normal dentition.  Neck: normal, supple, no masses, no thyromegaly Respiratory: There was  wheezing, no crackles. Normal respiratory effort. No accessory muscle use.  Cardiovascular:  Regular rate and rhythm, no murmurs / rubs / gallops. No extremity edema. 2+ pedal pulses. No carotid bruits.  Abdomen: no tenderness, no masses palpated. No hepatosplenomegaly. Bowel sounds positive.  Musculoskeletal: no clubbing / cyanosis. No joint deformity upper and lower extremities. Good ROM, no contractures. Normal muscle tone.  Skin: no rashes, lesions, ulcers. No induration Neurologic: CN 2-12 grossly intact. Sensation intact, DTR normal. Strength 5/5 in all 4.  Psychiatric: Normal judgment and insight. Alert and oriented x 3. Normal mood.    Labs on Admission: I have personally reviewed following labs and imaging studies  CBC: Recent Labs  Lab 02/18/21 0130 02/21/21 1240  WBC 6.7 7.9  NEUTROABS  --  6.7  HGB 11.7* 11.4*  HCT 39.5 38.5  MCV 90.8 90.0  PLT 216 502   Basic Metabolic Panel: Recent Labs  Lab 02/18/21 0130 02/21/21 1240  NA 142 139  K 3.9 3.8  CL 96* 94*  CO2 40* 37*  GLUCOSE 125* 149*  BUN 18 23  CREATININE 0.54 0.70  CALCIUM 8.6* 8.8*  MG  --  2.2   GFR: Estimated Creatinine Clearance: 41.1 mL/min (by C-G formula based on SCr of 0.7 mg/dL). Liver Function Tests: Recent Labs  Lab 02/18/21 0130 02/21/21 1240  AST 16 16  ALT 20 18   ALKPHOS 66 71  BILITOT 0.4 0.4  PROT 6.7 6.2*  ALBUMIN 3.7 3.5   No results for input(s): LIPASE, AMYLASE in the last 168 hours. No results for input(s): AMMONIA in the last 168 hours. Coagulation Profile: No results for input(s): INR, PROTIME in the last 168 hours. Cardiac Enzymes: No results for input(s): CKTOTAL, CKMB, CKMBINDEX, TROPONINI in the last 168 hours. BNP (last 3 results) No results for input(s): PROBNP in the last 8760 hours. HbA1C: No results for input(s): HGBA1C in the last 72 hours. CBG: No results for input(s): GLUCAP in the last 168 hours. Lipid Profile: No results for input(s): CHOL, HDL, LDLCALC, TRIG, CHOLHDL, LDLDIRECT in the last 72 hours. Thyroid Function Tests: No results for input(s): TSH, T4TOTAL, FREET4, T3FREE, THYROIDAB in the last 72 hours. Anemia Panel: No results for input(s): VITAMINB12, FOLATE, FERRITIN, TIBC, IRON, RETICCTPCT in the last 72 hours. Urine analysis:    Component Value Date/Time   COLORURINE AMBER (A) 06/21/2018 1450   APPEARANCEUR CLOUDY (A) 06/21/2018 1450   LABSPEC 1.020 06/21/2018 1450   PHURINE 5.0 06/21/2018 1450   GLUCOSEU NEGATIVE 06/21/2018 1450   HGBUR NEGATIVE 06/21/2018 1450   BILIRUBINUR NEGATIVE 06/21/2018 1450   KETONESUR NEGATIVE 06/21/2018 1450   PROTEINUR 30 (A) 06/21/2018 1450   UROBILINOGEN 0.2 03/25/2008 1702   NITRITE NEGATIVE 06/21/2018 1450   LEUKOCYTESUR TRACE (A) 06/21/2018 1450    Radiological Exams on Admission: DG Chest Portable 1 View  Result Date: 02/21/2021 CLINICAL DATA:  Cough, dyspnea, chest pain EXAM: PORTABLE CHEST 1 VIEW COMPARISON:  02/18/2021 chest radiograph. FINDINGS: Stable cardiomediastinal silhouette with normal heart size. No pneumothorax. No pleural effusion. Hyperinflated lungs. Biapical pleural capping and biapical reticular opacities and distortion, right greater than left, unchanged. No pulmonary edema. No acute consolidative airspace disease. IMPRESSION: 1. Hyperinflated  lungs, suggesting obstructive lung disease. 2. Stable biapical pleural capping and biapical reticular opacities and distortion, right greater than left, compatible with nonspecific fibrosis. Electronically Signed   By: Ilona Sorrel M.D.   On: 02/21/2021 13:58    EKG: Independently reviewed. Sinus rhythm  Assessment/Plan Active Problems:   COPD with acute exacerbation ([place])   COVID-19 virus infection  1.  COPD exacerbation: Likely precipitated by COVID-19 infection.  Continue Solu-Medrol and bronchodilators.  Continue supplemental oxygen.  2.  COVID-19 infection: Patient initially reported increased oxygen use at home.  Based on this, she was started on remdesivir.  Continue patient on steroids.  Continue supportive care and monitor patient closely.  3.  Hypothyroidism: Continue levothyroxine.  4.  Hypertension: Due to hypotensive episodes at the ED, I will withhold antihypertensives (amlodipine and hydrochlorothiazide) for now.  Monitor blood pressure profile and restart in the absence of any contraindications  5.  Chronic respiratory failure: Continue supplemental oxygen 2 L/min and titrate for goal oxygen saturation greater than 90%.   DVT prophylaxis: Lovenox  Code Status: Full code  Family Communication: Unable to reach any family member at this time Disposition Plan: Anticipated discharge home Consults called: None  Admission status: Place in observation status  Phineas Semen MD [facility] Pager [phone]  If 7PM-7AM, please contact night-coverage [email or web address] Password [email or web address]  02/21/2021, 10:14 PM

## 2021-02-22 DIAGNOSIS — J441 Chronic obstructive pulmonary disease with (acute) exacerbation: Secondary | ICD-10-CM | POA: Diagnosis not present

## 2021-02-22 DIAGNOSIS — U071 COVID-19: Secondary | ICD-10-CM | POA: Diagnosis not present

## 2021-02-22 LAB — CBC WITH DIFFERENTIAL/PLATELET
Abs Immature Granulocytes: 0.03 10*3/uL (ref 0.00–0.07)
Basophils Absolute: 0 10*3/uL (ref 0.0–0.1)
Basophils Relative: 0 %
Eosinophils Absolute: 0 10*3/uL (ref 0.0–0.5)
Eosinophils Relative: 0 %
HCT: 34.2 % — ABNORMAL LOW (ref 36.0–46.0)
Hemoglobin: 10 g/dL — ABNORMAL LOW (ref 12.0–15.0)
Immature Granulocytes: 1 %
Lymphocytes Relative: 7 %
Lymphs Abs: 0.3 10*3/uL — ABNORMAL LOW (ref 0.7–4.0)
MCH: 26.3 pg (ref 26.0–34.0)
MCHC: 29.2 g/dL — ABNORMAL LOW (ref 30.0–36.0)
MCV: 90 fL (ref 80.0–100.0)
Monocytes Absolute: 0.1 10*3/uL (ref 0.1–1.0)
Monocytes Relative: 2 %
Neutro Abs: 4.6 10*3/uL (ref 1.7–7.7)
Neutrophils Relative %: 90 %
Platelets: 196 10*3/uL (ref 150–400)
RBC: 3.8 MIL/uL — ABNORMAL LOW (ref 3.87–5.11)
RDW: 15.2 % (ref 11.5–15.5)
WBC: 5 10*3/uL (ref 4.0–10.5)
nRBC: 0 % (ref 0.0–0.2)

## 2021-02-22 LAB — COMPREHENSIVE METABOLIC PANEL
ALT: 19 U/L (ref 0–44)
AST: 19 U/L (ref 15–41)
Albumin: 3.2 g/dL — ABNORMAL LOW (ref 3.5–5.0)
Alkaline Phosphatase: 65 U/L (ref 38–126)
Anion gap: 9 (ref 5–15)
BUN: 22 mg/dL (ref 8–23)
CO2: 38 mmol/L — ABNORMAL HIGH (ref 22–32)
Calcium: 8.4 mg/dL — ABNORMAL LOW (ref 8.9–10.3)
Chloride: 95 mmol/L — ABNORMAL LOW (ref 98–111)
Creatinine, Ser: 0.69 mg/dL (ref 0.44–1.00)
GFR, Estimated: >60 mL/min (ref >60)
Glucose, Bld: 149 mg/dL — ABNORMAL HIGH (ref 70–99)
Potassium: 5.9 mmol/L — ABNORMAL HIGH (ref 3.5–5.1)
Sodium: 142 mmol/L (ref 135–145)
Total Bilirubin: 0.5 mg/dL (ref 0.3–1.2)
Total Protein: 6 g/dL — ABNORMAL LOW (ref 6.5–8.1)

## 2021-02-22 LAB — C-REACTIVE PROTEIN: CRP: 0.6 mg/dL (ref <1.0)

## 2021-02-22 LAB — MAGNESIUM: Magnesium: 2.2 mg/dL (ref 1.7–2.4)

## 2021-02-22 LAB — FERRITIN: Ferritin: 55 ng/mL (ref 11–307)

## 2021-02-22 LAB — PHOSPHORUS: Phosphorus: 4.8 mg/dL — ABNORMAL HIGH (ref 2.5–4.6)

## 2021-02-22 MED ORDER — PREDNISONE 20 MG PO TABS: 40.0000 mg | ORAL_TABLET | Freq: Every day | ORAL | 0 refills | Status: AC

## 2021-02-22 MED ORDER — ENSURE ENLIVE PO LIQD: 237.0000 mL | Freq: Two times a day (BID) | ORAL | Status: DC

## 2021-02-22 MED ORDER — ALBUTEROL SULFATE HFA 108 (90 BASE) MCG/ACT IN AERS: 1.0000 | INHALATION_SPRAY | Freq: Four times a day (QID) | RESPIRATORY_TRACT | 5 refills | Status: AC | PRN

## 2021-02-22 MED ORDER — INFLUENZA VAC A&B SA ADJ QUAD 0.5 ML IM PRSY
0.5000 mL | PREFILLED_SYRINGE | INTRAMUSCULAR | Status: AC
  Administered 2021-02-22: 0.5 mL via INTRAMUSCULAR
  Filled 2021-02-22: qty 0.5

## 2021-02-22 MED ORDER — ALBUTEROL SULFATE HFA 108 (90 BASE) MCG/ACT IN AERS
2.0000 | INHALATION_SPRAY | Freq: Four times a day (QID) | RESPIRATORY_TRACT | Status: DC
  Administered 2021-02-22 (×3): 2 via RESPIRATORY_TRACT
  Filled 2021-02-22: qty 6.7

## 2021-02-22 MED ORDER — STIOLTO RESPIMAT 2.5-2.5 MCG/ACT IN AERS: 2.0000 | INHALATION_SPRAY | Freq: Every day | RESPIRATORY_TRACT | 11 refills | Status: AC

## 2021-02-22 MED ORDER — SODIUM ZIRCONIUM CYCLOSILICATE 10 G PO PACK
10.0000 g | PACK | Freq: Once | ORAL | Status: AC
  Administered 2021-02-22: 10 g via ORAL
  Filled 2021-02-22: qty 1

## 2021-02-22 MED ORDER — PAXLOVID (300/100) 20 X 150 MG & 10 X 100MG PO TBPK: ORAL_TABLET | ORAL | 0 refills | Status: DC

## 2021-02-22 MED ORDER — AMLODIPINE BESYLATE 5 MG PO TABS: 5.0000 mg | ORAL_TABLET | Freq: Every day | ORAL | 2 refills | Status: AC

## 2021-02-22 MED ORDER — ALBUTEROL SULFATE HFA 108 (90 BASE) MCG/ACT IN AERS: 2.0000 | INHALATION_SPRAY | RESPIRATORY_TRACT | Status: DC | PRN

## 2021-02-22 MED ORDER — ALBUTEROL SULFATE (2.5 MG/3ML) 0.083% IN NEBU: 2.5000 mg | INHALATION_SOLUTION | Freq: Four times a day (QID) | RESPIRATORY_TRACT | 2 refills | Status: AC | PRN

## 2021-02-22 MED ORDER — GUAIFENESIN ER 600 MG PO TB12: 600.0000 mg | ORAL_TABLET | Freq: Two times a day (BID) | ORAL | 0 refills | Status: AC

## 2021-02-22 MED ORDER — SODIUM ZIRCONIUM CYCLOSILICATE 10 G PO PACK
10.0000 g | PACK | Freq: Once | ORAL | Status: DC
  Filled 2021-02-22: qty 1

## 2021-02-22 MED ORDER — PANTOPRAZOLE SODIUM 40 MG PO TBEC: 40.0000 mg | DELAYED_RELEASE_TABLET | Freq: Every day | ORAL | 3 refills | Status: AC

## 2021-02-22 NOTE — Discharge Summary (Signed)
Marilyn Solomon, is a 76 y.o. female  DOB [birth year]  MRN [id number].  Admission date:  02/21/2021  Admitting Physician  Phineas Semen, MD  Discharge Date:  02/22/2021   Primary MD  Lindell Spar, MD  Recommendations for primary care physician for things to follow:   1)Avoid ibuprofen/Advil/Aleve/Motrin/Goody Powders/Naproxen/BC powders/Meloxicam/Diclofenac/Indomethacin and other Nonsteroidal anti-inflammatory medications as these will make you more likely to bleed and can cause stomach ulcers, can also cause Kidney problems.   2)Please Take Paxlovid --as prescribed for 5 days for COVID-19 infection  3)Please hold amlodipine while taking Paxlovid for 5 days  4)You need oxygen at home at 2 L via nasal cannula continuously while awake and while asleep--- smoking or having open fires around oxygen can cause fire, significant injury and death  5)You are strongly advised to isolate/quarantine for at least 10 days from the date of your diagnosis with COVID-19 infection--please always wear a mask if you have to go outside the house  6)Please take medications as prescribed  7)Video/Virtual follow-up visit with primary care physician in about a week advised  Admission Diagnosis  COPD exacerbation ([place]) [J44.1] COVID-19 virus infection [U07.1]  Discharge Diagnosis  COPD exacerbation ([place]) [J44.1] COVID-19 virus infection [U07.1]    Active Problems:   COPD with acute exacerbation ([place])   COVID-19 virus infection      Past Medical History:  Diagnosis Date   Anemia    Anxiety    Arthritis    C. difficile diarrhea 10/08/2019   Chronic back pain    COPD (chronic obstructive pulmonary disease) (HCC)    GERD (gastroesophageal reflux disease)    HCAP (healthcare-associated pneumonia) 06/28/2017   Hypertension    Hypothyroidism    On home oxygen therapy    2L   Pneumonia due to COVID-19 virus 09/28/2019    Reflux    no medications currently, asymptomatic   Thyroid disease     Past Surgical History:  Procedure Laterality Date   ABDOMINAL HYSTERECTOMY     BIOPSY  10/08/2017   Procedure: BIOPSY;  Surgeon: Daneil Dolin, MD;  [facility];  Service: Endoscopy;;  gastric   cataracts Bilateral    COLONOSCOPY  2007   Dr. Gala Romney: normal rectum, diverticula    COLONOSCOPY WITH PROPOFOL N/A 11/15/2015   Dr. Gala Romney: grade II hemorrhoids, diverticulosis   COLONOSCOPY WITH PROPOFOL N/A 10/08/2017   Rourk: Diverticulosis   ESOPHAGOGASTRODUODENOSCOPY (EGD) WITH PROPOFOL N/A 10/08/2017   Rourk: Nonobstructing Schatzki ring, mild erosive reflux esophagitis, small hiatal hernia, gastritis but no H. pylori.   FRACTURE SURGERY Right 2005   Wrist   JOINT REPLACEMENT Left 2000   hip   RADICAL NECK DISSECTION Right 09/13/2020   Procedure: RADICAL NECK DISSECTION;  Surgeon: Melida Quitter, MD;  Location: [facility];  Service: ENT;  Laterality: Right;   THYROIDECTOMY N/A 09/13/2020   Procedure: TOTAL THYROIDECTOMY;  Surgeon: Melida Quitter, MD;  Location: [facility];  Service: ENT;  Laterality: N/A;   TOTAL HIP ARTHROPLASTY Left  WRIST SURGERY Right      HPI  from the history and physical done on the day of admission:    Chief Complaint: Shortness of breath   HPI: Marilyn Solomon is a 75 y.o. female with medical history significant for COPD, chronic respiratory failure on 2l/min home oxygen, hypertension and hypothyroidism.   She has been at her baseline until this morning when she started experiencing worsening shortness of breath which was pronounced today.  Patient was seen on 02/18/2021 for shortness of breath which improved until today.  She occasionally has nonproductive cough and general malaise.  She denies fever, orthopnea, chest pain, PND, nausea, vomiting, diarrhea, polyuria, polydipsia, headache or loss of consciousness.  ED Course: Initial vital signs are blood pressure 117/68, pulse 98,  respiratory rate 22 and she was afebrile.  She had transient episodes of hypotension in the ED that responded to IV fluid challenge.  She tested positive for SARS-CoV-2 and chest radiography was negative for pneumonia.   Review of Systems: As per HPI otherwise ;  Negative except as highlighted in HPI above     Hospital Course:     1.  COPD exacerbation: Likely precipitated by COVID-19 infection.  Overall much improved after Solu-Medrol and bronchodilators. -Okay to discharge home on prednisone and bronchodilators  2.  COVID-19 infection-- Pt was treated with remdesivir and steroids  -Clinically much improved, okay to discharge home on prednisone and Paxlovid  3.  Hypothyroidism: Continue levothyroxine.  4.  Hypertension: BP was initially soft, --blood pressures improved, discontinue HCTZ -Patient may restart amlodipine after completing paxlovid due to potential drug drug interaction  5.  Chronic respiratory failure: -Oxygen requirement is back to baseline which is 2 L/min for patient -PTA patient was on 2 L/min of oxygen  -post ambulation O2 sats is 95 to 96% on 2 L of oxygen, okay to discharge home on same  6)Mild HyperKalemia/Hyperphosphatemia--admission EKG sinus rhythm without acute findings--- continue bronchodilators  -Magnesium is 2.2 and renal function WNL -On monitor no significant concerning changes -Repeat BMP with PCP  Discharge Condition: stable  Follow UP--- PCP     Consults obtained - na  Diet and Activity recommendation:  As advised  Discharge Instructions   Discharge Instructions     Call MD for:  difficulty breathing, headache or visual disturbances   Complete by: As directed    Call MD for:  persistant dizziness or light-headedness   Complete by: As directed    Call MD for:  persistant nausea and vomiting   Complete by: As directed    Call MD for:  temperature >100.4   Complete by: As directed    Diet - low sodium heart healthy   Complete by: As  directed    Discharge instructions   Complete by: As directed    1)Avoid ibuprofen/Advil/Aleve/Motrin/Goody Powders/Naproxen/BC powders/Meloxicam/Diclofenac/Indomethacin and other Nonsteroidal anti-inflammatory medications as these will make you more likely to bleed and can cause stomach ulcers, can also cause Kidney problems.   2)Please Take Paxlovid --as prescribed for 5 days for COVID-19 infection  3) please hold amlodipine while taking Paxlovid for 5 days  4) you need oxygen at home at 2 L via nasal cannula continuously while awake and while asleep--- smoking or having open fires around oxygen can cause fire, significant injury and death  5)You are strongly advised to isolate/quarantine for at least 10 days from the date of your diagnosis with COVID-19 infection--please always wear a mask if you have to go outside the  house  6)Please take medications as prescribed  7)Video/Virtual follow-up visit with primary care physician in about a week advised   Increase activity slowly   Complete by: As directed          Discharge Medications     Allergies as of 02/22/2021       Reactions   Penicillins Itching   Tolerated Ancef Intraop 09/13/20   Vicodin [hydrocodone-acetaminophen] Nausea And Vomiting        Medication List     STOP taking these medications    azithromycin 250 MG tablet Commonly known as: ZITHROMAX   hydrochlorothiazide 25 MG tablet Commonly known as: HYDRODIURIL   ibuprofen 200 MG tablet Commonly known as: ADVIL       TAKE these medications    albuterol (2.5 MG/3ML) 0.083% nebulizer solution Commonly known as: PROVENTIL Take 3 mLs (2.5 mg total) by nebulization every 6 (six) hours as needed for wheezing or shortness of breath.   albuterol 108 (90 Base) MCG/ACT inhaler Commonly known as: VENTOLIN HFA Inhale 1-2 puffs into the lungs every 6 (six) hours as needed for wheezing or shortness of breath.   ALPRAZolam 1 MG tablet Commonly known as:  XANAX Take 1 tablet (1 mg total) by mouth every 8 (eight) hours as needed for anxiety.   amLODipine 5 MG tablet Commonly known as: NORVASC Take 1 tablet (5 mg total) by mouth daily. Start taking on: February 28, 2021 What changed: These instructions start on February 28, 2021. If you are unsure what to do until then, ask your doctor or other care provider.   guaiFENesin 600 MG 12 hr tablet Commonly known as: MUCINEX Take 1 tablet (600 mg total) by mouth 2 (two) times daily.   levothyroxine 75 MCG tablet Commonly known as: SYNTHROID Take 1 tablet (75 mcg total) by mouth daily.   ondansetron 8 MG tablet Commonly known as: ZOFRAN Take 8 mg by mouth every 8 (eight) hours as needed for nausea/vomiting.   oxyCODONE 15 MG immediate release tablet Commonly known as: ROXICODONE TAKE (1) TABLET BY MOUTH EVERY 8 HOURS AS NEEDED. What changed: See the new instructions.   pantoprazole 40 MG tablet Commonly known as: PROTONIX Take 1 tablet (40 mg total) by mouth daily.   Paxlovid (300/100) 20 x 150 MG & 10 x 100MG  Tbpk Generic drug: nirmatrelvir & ritonavir Take as instructed for 5 days   predniSONE 20 MG tablet Commonly known as: DELTASONE Take 2 tablets (40 mg total) by mouth daily with breakfast for 5 days. What changed:  medication strength how much to take when to take this   promethazine-dextromethorphan 6.25-15 MG/5ML syrup Commonly known as: PROMETHAZINE-DM Take 5 mLs by mouth 4 (four) times daily as needed for cough.   Stiolto Respimat 2.5-2.5 MCG/ACT Aers Generic drug: Tiotropium Bromide-Olodaterol Inhale 2 puffs into the lungs daily.   Vitamin D (Ergocalciferol) 1.25 MG (50000 UNIT) Caps capsule Commonly known as: DRISDOL Take 50,000 Units by mouth every Sunday.        Major procedures and Radiology Reports - PLEASE review detailed and final reports for all details, in brief -  DG Chest 2 View  Result Date: 02/18/2021 CLINICAL DATA:  Shortness of breath. EXAM:  CHEST - 2 VIEW COMPARISON:  Chest radiograph dated 02/12/2021 FINDINGS: Background of emphysema. Biapical subpleural scarring. No focal consolidation, pleural effusion or pneumothorax the cardiac silhouette is within limits. No acute osseous pathology. IMPRESSION: No active cardiopulmonary disease. Emphysema. Electronically Signed   By: Laren Everts.D.  On: 02/18/2021 00:52   DG Chest Portable 1 View  Result Date: 02/21/2021 CLINICAL DATA:  Cough, dyspnea, chest pain EXAM: PORTABLE CHEST 1 VIEW COMPARISON:  02/18/2021 chest radiograph. FINDINGS: Stable cardiomediastinal silhouette with normal heart size. No pneumothorax. No pleural effusion. Hyperinflated lungs. Biapical pleural capping and biapical reticular opacities and distortion, right greater than left, unchanged. No pulmonary edema. No acute consolidative airspace disease. IMPRESSION: 1. Hyperinflated lungs, suggesting obstructive lung disease. 2. Stable biapical pleural capping and biapical reticular opacities and distortion, right greater than left, compatible with nonspecific fibrosis. Electronically Signed   By: Ilona Sorrel M.D.   On: 02/21/2021 13:58   DG Chest Port 1 View  Result Date: 02/12/2021 CLINICAL DATA:  Shortness of breath and weakness. EXAM: PORTABLE CHEST 1 VIEW COMPARISON:  February 06, 2021 FINDINGS: The lungs are hyperinflated. Diffuse, chronic appearing increased lung markings are seen without evidence of focal consolidation. Mild biapical scarring and/or atelectasis is present with mild biapical pleural thickening. There is no evidence of a pleural effusion or pneumothorax. The heart size and mediastinal contours are within normal limits. The visualized skeletal structures are unremarkable. IMPRESSION: 1. Findings consistent with COPD. 2. No acute or active cardiopulmonary disease. Electronically Signed   By: Virgina Norfolk M.D.   On: 02/12/2021 04:17   DG Chest Port 1 View  Result Date: 02/06/2021 CLINICAL DATA:   Shortness of breath. EXAM: PORTABLE CHEST 1 VIEW COMPARISON:  Chest radiograph dated 01/18/2021. FINDINGS: Emphysema with biapical subpleural scarring. No consolidative changes. There is no pleural effusion or pneumothorax. The cardiac silhouette is within normal limits. Atherosclerotic calcification of the aorta. Osteopenia with degenerative changes of the spine. No acute osseous pathology. IMPRESSION: 1. No acute cardiopulmonary process. 2. Emphysema. Electronically Signed   By: Anner Crete M.D.   On: 02/06/2021 00:32    Micro Results   Recent Results (from the past 240 hour(s))  Resp Panel by RT-PCR (Flu A&B, Covid) Nasopharyngeal Swab     Status: Abnormal   Collection Time: 02/21/21  1:35 PM   Specimen: Nasopharyngeal Swab; Nasopharyngeal(NP) swabs in vial transport medium  Result Value Ref Range Status   SARS Coronavirus 2 by RT PCR POSITIVE (A) NEGATIVE Final    Comment: CRITICAL RESULT CALLED TO, READ BACK BY AND VERIFIED WITH: DOSS,MICHAEL AT 1450 ON 9.26.22 BY RUCINSKI,B (NOTE) SARS-CoV-2 target nucleic acids are DETECTED.  The SARS-CoV-2 RNA is generally detectable in upper respiratory specimens during the acute phase of infection. Positive results are indicative of the presence of the identified virus, but do not rule out bacterial infection or co-infection with other pathogens not detected by the test. Clinical correlation with patient history and other diagnostic information is necessary to determine patient infection status. The expected result is Negative.  Fact Sheet for Patients: EntrepreneurPulse.com.au  Fact Sheet for Healthcare Providers: IncredibleEmployment.be  This test is not yet approved or cleared by the [place] FDA and  has been authorized for detection and/or diagnosis of SARS-CoV-2 by FDA under an Emergency Use Authorization (EUA).  This EUA will remain in effect (mea ning this test can be used) for the  duration of  the COVID-19 declaration under Section 564(b)(1) of the Act, 21 U.S.C. section 360bbb-3(b)(1), unless the authorization is terminated or revoked sooner.     Influenza A by PCR NEGATIVE NEGATIVE Final   Influenza B by PCR NEGATIVE NEGATIVE Final    Comment: (NOTE) The Xpert Xpress SARS-CoV-2/FLU/RSV plus assay is intended as an aid in the diagnosis of influenza  from Nasopharyngeal swab specimens and should not be used as a sole basis for treatment. Nasal washings and aspirates are unacceptable for Xpert Xpress SARS-CoV-2/FLU/RSV testing.  Fact Sheet for Patients: EntrepreneurPulse.com.au  Fact Sheet for Healthcare Providers: IncredibleEmployment.be  This test is not yet approved or cleared by the [place] FDA and has been authorized for detection and/or diagnosis of SARS-CoV-2 by FDA under an Emergency Use Authorization (EUA). This EUA will remain in effect (meaning this test can be used) for the duration of the COVID-19 declaration under Section 564(b)(1) of the Act, 21 U.S.C. section 360bbb-3(b)(1), unless the authorization is terminated or revoked.  Performed at [facility], [address], [place], [place] [address]        Today   Subjective    Marilyn Solomon today has no new complaints  No fever  Or chills   No Nausea, Vomiting or Diarrhea        Patient has been seen and examined prior to discharge   Objective   Blood pressure 93/62, pulse 79, temperature 97.6 F (36.4 C), temperature source Oral, resp. rate 18, height 5\' 5"  (1.651 m), weight 42.2 kg, SpO2 99 %.   Intake/Output Summary (Last 24 hours) at 02/22/2021 1614 Last data filed at 02/22/2021 1513 Gross per 24 hour  Intake 340 ml  Output --  Net 340 ml    Exam Gen:- Awake Alert, no acute distress , speaking in complete sentences HEENT:- [place].AT, No sclera icterus Nose- [place] 2L/min Neck-Supple Neck,No JVD,.  Lungs-fair air movement, no  wheezing  CV- S1, S2 normal, regular Abd-  +ve B.Sounds, Abd Soft, No tenderness,    Extremity/Skin:- No  edema,   good pulses Psych-affect is appropriate, oriented x3 Neuro-no new focal deficits, no tremors    Data Review   CBC w Diff:  Lab Results  Component Value Date   WBC 5.0 02/22/2021   HGB 10.0 (L) 02/22/2021   HGB 10.6 (L) 06/03/2020   HCT 34.2 (L) 02/22/2021   HCT 34.9 06/03/2020   PLT 196 02/22/2021   PLT 250 06/03/2020   LYMPHOPCT 7 02/22/2021   BANDSPCT 16 09/30/2019   MONOPCT 2 02/22/2021   EOSPCT 0 02/22/2021   BASOPCT 0 02/22/2021    CMP:  Lab Results  Component Value Date   NA 142 02/22/2021   NA 142 06/03/2020   K 5.9 (H) 02/22/2021   CL 95 (L) 02/22/2021   CO2 38 (H) 02/22/2021   BUN 22 02/22/2021   BUN 22 06/03/2020   CREATININE 0.69 02/22/2021   PROT 6.0 (L) 02/22/2021   PROT 7.7 06/03/2020   ALBUMIN 3.2 (L) 02/22/2021   ALBUMIN 4.2 06/03/2020   BILITOT 0.5 02/22/2021   BILITOT 0.2 06/03/2020   ALKPHOS 65 02/22/2021   AST 19 02/22/2021   ALT 19 02/22/2021  .   Total Discharge time is about 33 minutes  Roxan Hockey M.D on 02/22/2021 at 4:14 PM  Go to [email or web address] -  for contact info  [facility] - Office  [phone]

## 2021-02-22 NOTE — Discharge Instructions (Signed)
1)Avoid ibuprofen/Advil/Aleve/Motrin/Goody Powders/Naproxen/BC powders/Meloxicam/Diclofenac/Indomethacin and other Nonsteroidal anti-inflammatory medications as these will make you more likely to bleed and can cause stomach ulcers, can also cause Kidney problems.   2)Please Take Paxlovid --as prescribed for 5 days for COVID-19 infection  3) please hold amlodipine while taking Paxlovid for 5 days  4) you need oxygen at home at 2 L via nasal cannula continuously while awake and while asleep--- smoking or having open fires around oxygen can cause fire, significant injury and death  5)You are strongly advised to isolate/quarantine for at least 10 days from the date of your diagnosis with COVID-19 infection--please always wear a mask if you have to go outside the house  6)Please take medications as prescribed  7)Video/Virtual follow-up visit with primary care physician in about a week advised

## 2021-02-23 ENCOUNTER — Telehealth: Payer: Self-pay

## 2021-02-23 ENCOUNTER — Telehealth: Payer: Self-pay | Admitting: *Deleted

## 2021-02-23 ENCOUNTER — Other Ambulatory Visit: Payer: Self-pay

## 2021-02-23 DIAGNOSIS — J441 Chronic obstructive pulmonary disease with (acute) exacerbation: Secondary | ICD-10-CM

## 2021-02-23 DIAGNOSIS — C73 Malignant neoplasm of thyroid gland: Secondary | ICD-10-CM | POA: Diagnosis not present

## 2021-02-23 NOTE — Telephone Encounter (Signed)
Transition Care Management Follow-up Telephone Call Date of discharge and from where: [facility] 02/22/21  How have you been since you were released from the hospital? Better little shortness of breath  Any questions or concerns? No  Items Reviewed: Did the pt receive and understand the discharge instructions provided? Yes  Medications obtained and verified? Yes  Other? No  Any new allergies since your discharge? No  Dietary orders reviewed? Yes Do you have support at home? Yes   Home Care and Equipment/Supplies: Were home health services ordered? no If so, what is the name of the agency?   Has the agency set up a time to come to the patient's home? no Were any new equipment or medical supplies ordered?  No What is the name of the medical supply agency?  Were you able to get the supplies/equipment? no Do you have any questions related to the use of the equipment or supplies? No  Functional Questionnaire: (I = Independent and D = Dependent) ADLs: d  Bathing/Dressing- d  Meal Prep- d  Eating- d  Maintaining continence- d  Transferring/Ambulation- d  Managing Meds- d  Follow up appointments reviewed:  PCP Hospital f/u appt confirmed? Yes  Scheduled to see patel on 03/01/21 @ 2:20. [facility] f/u appt confirmed? No  Scheduled to see  on  @ . Are transportation arrangements needed? No  If their condition worsens, is the pt aware to call PCP or go to the Emergency Dept.? Yes Was the patient provided with contact information for the PCP's office or ED? Yes Was to pt encouraged to call back with questions or concerns? Yes

## 2021-02-23 NOTE — Chronic Care Management (AMB) (Signed)
  Chronic Care Management   Note  02/23/2021 Name: Marilyn Solomon MRN: [id number] DOB: May 31, 1945  Marilyn Friar Full is a 75 y.o. year old female who is a primary care patient of Lindell Spar, MD. I reached out to Remonia Richter by phone today in response to a referral sent by Marilyn Solomon PCP.  Marilyn Solomon was given information about Chronic Care Management services today including:  CCM service includes personalized support from designated clinical staff supervised by her physician, including individualized plan of care and coordination with other care providers 24/7 contact phone numbers for assistance for urgent and routine care needs. Service will only be billed when office clinical staff spend 20 minutes or more in a month to coordinate care. Only one practitioner may furnish and bill the service in a calendar month. The patient may stop CCM services at any time (effective at the end of the month) by phone call to the office staff. The patient is responsible for co-pay (up to 20% after annual deductible is met) if co-pay is required by the individual health plan.   Patient agreed to services and verbal consent obtained.   Follow up plan: Telephone appointment with care management team member scheduled for:02/24/21  Taren Dymek  Care Guide, Embedded Care Coordination [place]  Care Management  Direct Dial: [phone]

## 2021-02-24 ENCOUNTER — Ambulatory Visit (INDEPENDENT_AMBULATORY_CARE_PROVIDER_SITE_OTHER): Payer: Medicare Other | Admitting: *Deleted

## 2021-02-24 DIAGNOSIS — J209 Acute bronchitis, unspecified: Secondary | ICD-10-CM

## 2021-02-24 DIAGNOSIS — C73 Malignant neoplasm of thyroid gland: Secondary | ICD-10-CM

## 2021-02-24 DIAGNOSIS — J440 Chronic obstructive pulmonary disease with acute lower respiratory infection: Secondary | ICD-10-CM

## 2021-02-24 DIAGNOSIS — J441 Chronic obstructive pulmonary disease with (acute) exacerbation: Secondary | ICD-10-CM

## 2021-02-24 NOTE — Patient Instructions (Signed)
Visit Information   PATIENT GOALS:   Goals Addressed             This Visit's Progress    Manage Fatigue/ Tiredness related to cancer       Timeframe:  Long-Range Goal Priority:  High Start Date:           02/24/2021                  Expected End Date:    08/24/2021                   Follow Up Date 03/08/2021   - develop a new routine to improve sleep - eat healthy - get at least 8 hours of sleep at night - get outdoors every day (weather permitting) - keep the bedroom cool and dark - limit daytime naps - use meditation or relaxation techniques  - keep up to date on immunizations - practice good handwashing and wear a mask as needed     Why is this important?   Cancer treatment and its side effects can drain your energy. It can keep you from doing things you would like to do.  There are many things that can be done to manage fatigue.    Notes:      Track and Manage My Symptoms-COPD       Timeframe:  Long-Range Goal Priority:  High Start Date:     02/24/2021                        Expected End Date:      08/24/2021                 Follow Up Date 03/08/2021    - develop a rescue plan - eliminate symptom triggers at home  (dust, strong scents, pet dander, etc) - follow rescue plan if symptoms flare-up, call your doctor early on for change in health status, symptoms, call 911 if needed for emergency such as unrelieved shortness of breath - keep follow-up appointments - use an extra pillow to sleep  - expect a call from pharmacist about the cost of Paxlovid (medicine you are to take for  Covid) - expect a call from care guide for resources for hearing aides - take all medications and use inhalers as prescribed   Why is this important?   Tracking your symptoms and other information about your health helps your doctor plan your care.  Write down the symptoms, the time of day, what you were doing and what medicine you are taking.  You will soon learn how to manage your  symptoms.     Notes:         Consent to CCM Services: Ms. Silveira was given information about Chronic Care Management services including:  CCM service includes personalized support from designated clinical staff supervised by her physician, including individualized plan of care and coordination with other care providers 24/7 contact phone numbers for assistance for urgent and routine care needs. Service will only be billed when office clinical staff spend 20 minutes or more in a month to coordinate care. Only one practitioner may furnish and bill the service in a calendar month. The patient may stop CCM services at any time (effective at the end of the month) by phone call to the office staff. The patient will be responsible for cost sharing (co-pay) of up to 20% of the service fee (after annual deductible is met).  Patient agreed to services and verbal consent obtained.   The patient verbalized understanding of instructions, educational materials, and care plan provided today and agreed to receive a mailed copy of patient instructions, educational materials, and care plan.   Telephone follow up appointment with care management team member scheduled for:  10/11/2022Living With Chronic Cancer Chronic cancer refers to cancers that are not cured with treatment but can be controlled for months or years with treatment. The goal of treatment with chronic cancer is to help you maintain the best quality of life possible for as long as possible. The unknowns of living with chronic cancer can be challenging. Finding healthy ways to manage your emotions will help you get through difficult times. How to manage lifestyle changes Managing stress Finding healthy ways to take care of yourself can reduce stress and anxiety. This can make a difficult situation feel easier to handle. Try some of these suggestions: Do a relaxing activity that you enjoy, such as drawing, painting, listening to music, reading, or  meditating. Stay social and active, if you feel up to it. Get regular exercise. Consider trying a type of gentle mind-body exercise, such as yoga or tai chi.  Relationships Stay connected to the people in your life. You could schedule a regular day to spend time together, or you could stay in touch by phone or online. Other ways to stay connected include: Planning a social activity each week, depending on how you feel. Making an effort to tell others about the good parts of your day, as well as the difficult parts. Exchanging entries in a common journal together. It may be easier to communicate about difficult topics by writing them down instead of saying them out loud. Texting, emailing, or video chatting with your friends and family members when you are not able to spend time together in person. Setting up a social media site where you can share news and where others can get updates and send you messages. General instructions  You may need to try different things until you find what works for you. You can start by: Letting the people in your life know how you feel. Keeping a journal to help you sort through your thoughts and emotions. Sometimes writing them down can make you feel better. Joining a support group for people who are going through the same thing as you. You may make new friends and get good information and support. It is normal to have many feelings during this time. If you have feelings of overwhelming sadness for longer than 2 weeks, let your health care provider know. You may benefit from visiting with a mental health professional. How to recognize stress Everyone is likely to feel stress while living with cancer. Be aware of the following symptoms of stress. These may include: Emotional symptoms, such as: Depression. Anxiety. Extreme anger. Relationship and behavior symptoms, such as: Isolating yourself. Fighting with others over small things. Avoiding important  relationships. Follow these instructions at home: Lifestyle Do not drink alcohol. Do not use any products that contain nicotine or tobacco, such as cigarettes, e-cigarettes, and chewing tobacco. If you need help quitting, ask your health care provider. Do your best to get enough sleep. Limit naps to short periods throughout the day. Try to get in regular, gentle exercise each day. General instructions  Drink enough fluid to keep your urine pale yellow. Eat plenty of healthy foods, such as lean meats, whole grains, fruits, and vegetables. Consider meeting with a dietitian to talk  about what you should eat and drink. Take over-the-counter and prescription medicines only as told by your health care provider. Keep all follow-up visits as told by your health care provider. This is important. Where to find support Talking to others Decide how much you want to share with others about what you are going through. Explain that sometimes you might want to talk about what you are going through, and other times you might want to focus on other things besides cancer. It is important to know that you are not alone. Consider talking with: A mental health professional or therapist who specializes in dealing with cancer. Family members. Close friends. A member of your church, faith, or community group.  Therapy and support groups Consider joining a support group. If you can, make friends who are going through a similar experience. Someone who is able to really understand what you are going through can be a good source of support and help you stay positive. If there is not a group near you, consider an online support group. Ask for support and resources from your cancer care team and caregivers. Other ways to find support Consider asking a friend or caregiver to help with errands such as grocery shopping, cleaning, and driving you to your appointments. If you are able, consider paying for these services if  you do not have people to help you. Contact your local home care agencies, community agencies, and social agencies for help. Ask to meet with a palliative care specialist for help treating your pain and other symptoms. Palliative care can help you manage symptoms, promote comfort, improve quality of life, and maintain dignity. Palliative care may be offered during any phase of a cancer diagnosis. Where to find more information American Cancer Society: www.cancer.[place]: www.cancer.gov Contact a health care provider if you: Are feeling overwhelmed with your emotions. Recognize that your stress is getting to be too much to handle on your own. Are feeling depressed. Get help right away if you: Have pain that is not controlled well with medicine. Are thinking about hurting yourself or someone else. Are feeling hopeless about treatment for the cancer, and you just want to give up. If you ever feel like you may hurt yourself or others, or have thoughts about taking your own life, get help right away. Go to your nearest emergency department or: Call your local emergency services (911 in the [place] Call a suicide crisis helpline, such as the [facility] at 302-545-5981. This is open 24 hours a day in the [place] Text the Crisis Text Line at 406 711 4298 (in the [place].). Summary Chronic cancer refers to cancers that are not cured with treatment but can be controlled for months or years with treatment. The unknowns of living with chronic cancer can be challenging. Finding healthy ways to manage your emotions will help you get through difficult times. Ask for support and resources from your cancer care team and caregivers. Ask to meet with a palliative care specialist for help treating your pain and other symptoms. Palliative care may be offered during any phase of a cancer diagnosis. This information is not intended to replace advice given to you by your health  care provider. Make sure you discuss any questions you have with your health care provider. Document Revised: 06/18/2019 Document Reviewed: 06/18/2019 Elsevier Patient Education  [place]. COPD Action Plan A COPD action plan is a description of what to do when you have a flare (exacerbation) of chronic  obstructive pulmonary disease (COPD). Your action plan is a color-coded plan that lists the symptoms that indicate whether your condition is under control and what actions to take. If you have symptoms in the green zone, it means you are doing well that day. If you have symptoms in the yellow zone, it means you are having a bad day or an exacerbation. If you have symptoms in the red zone, you need urgent medical care. Follow the plan that you and your health care provider developed. Review your plan with your health care provider at each visit. Red zone Symptoms in this zone mean that you should get medical help right away. They include: Feeling very short of breath, even when you are resting. Not being able to do any activities because of poor breathing. Not being able to sleep because of poor breathing. Fever or shaking chills. Feeling confused or very sleepy. Chest pain. Coughing up blood. If you have any of these symptoms, call emergency services (911 in the [place] or go to the nearest emergency room. Yellow zone Symptoms in this zone mean that your condition may be getting worse. They include: Feeling more short of breath than usual. Having less energy for daily activities than usual. Phlegm or mucus that is thicker than usual. Needing to use your rescue inhaler or nebulizer more often than usual. More ankle swelling than usual. Coughing more than usual. Feeling like you have a chest cold. Trouble sleeping due to COPD symptoms. Decreased appetite. COPD medicines not helping as much as usual. If you experience any "yellow" symptoms: Keep taking your daily medicines as  directed. Use your quick-relief inhaler as told by your health care provider. If you were prescribed steroid medicine to take by mouth (oral medicine), start taking it as told by your health care provider. If you were prescribed an antibiotic medicine, start taking it as told by your health care provider. Do not stop taking the antibiotic even if you start to feel better. Use oxygen as told by your health care provider. Get more rest. Do your pursed-lip breathing exercises. Do not smoke. Avoid any irritants in the air. If your signs and symptoms do not improve after taking these steps, call your health care provider right away. Green zone Symptoms in this zone mean that you are doing well. They include: Being able to do your usual activities and exercise. Having the usual amount of coughing, including the same amount of phlegm or mucus. Being able to sleep well. Having a good appetite. Where to find more information: You can find more information about COPD from: American Lung Association, My COPD Action Plan: www.lung.org COPD Foundation: www.copdfoundation.[place]: https://wilson-eaton.com/ Follow these instructions at home: Continue taking your daily medicines as told by your health care provider. Make sure you receive all the immunizations that your health care provider recommends, especially the pneumococcal and influenza vaccines. Wash your hands often with soap and water. Have family members wash their hands too. Regular hand washing can help prevent infections. Follow your usual exercise and diet plan. Avoid irritants in the air, such as smoke. Do not use any products that contain nicotine or tobacco. These products include cigarettes, chewing tobacco, and vaping devices, such as e-cigarettes. If you need help quitting, ask your health care provider. Summary A COPD action plan tells you what to do when you have a flare (exacerbation) of chronic  obstructive pulmonary disease (COPD). Follow each action plan for your symptoms. If you have  any symptoms in the red zone, call emergency services (911 in the [place] or go to the nearest emergency room. This information is not intended to replace advice given to you by your health care provider. Make sure you discuss any questions you have with your health care provider. Document Revised: 03/23/2020 Document Reviewed: 03/23/2020 Elsevier Patient Education  2022 [facility], BSN RN Case Manager [facility] [phone]   CLINICAL CARE PLAN: Patient Care Plan: COPD (Adult)     Problem Identified: Symptom Exacerbation (COPD)   Priority: High     Long-Range Goal: Symptom Exacerbation Prevented or Minimized   Start Date: 02/24/2021  Expected End Date: 08/24/2021  This Visit's Progress: On track  Priority: High  Note:   Current Barriers:  Knowledge deficits related to basic understanding of COPD disease process- needs reinforcement of COPD action plan with emphasis on yellow zone, pt reports she lives with her son and granddaughter, her car is broken down, her son provides transportation, pt states she has pulse oximeter and checks frequently with readings 98-100%.  Uses oxygen continuously at 2 liters via [place].  Pt is drinking 2 ensure daily and trying to eat small meals due to malignant esophageal dysphagia and tracheal obstruction.  Pt reports she does not have a feeding tube although this had been discussed, patient refused hospice at present.  Pt reports she cannot afford hearing aides and would like any resources for this. Knowledge deficits related to basic COPD self care/management Cannot afford prescribed medications- unable to afford paxlovid Does not adhere to prescribed medication regimen- does not have paxlovid or stiolto respimat inhaler and states (not sure why I don't have that, unsure what that is)  is unable to take mucinex because the pill is  too large and she chokes Unable to perform ADLs independently- family assists as needed, patient reports she is mostly independent but is "tired due to COPD and cancer" Does not contact provider office for questions/concerns- pt has not informed her doctor of not taking paxlovid or issues with affordability, has not reported she is unable to take municex Case Manager Clinical Goal(s): patient will report using inhalers as prescribed including rinsing mouth after use patient will report utilizing pursed lip breathing for shortness of breath patient will verbalize understanding of COPD action plan and when to seek appropriate levels of medical care patient will verbalize basic understanding of COPD disease process and self care activities  Interventions:  Collaboration with Lindell Spar, MD regarding development and update of comprehensive plan of care as evidenced by provider attestation and co-signature Inter-disciplinary care team collaboration (see longitudinal plan of care) Provided (mailed) patient with basic written and verbal COPD education on self care/management/and exacerbation prevention  Provided patient with COPD action plan and reinforced importance of daily self assessment Provided instruction about proper use of medications used for management of COPD including inhalers Advised patient to self assesses COPD action plan zone and make appointment with provider if in the yellow zone for 48 hours without improvement. Provided education about and advised patient to utilize infection prevention strategies to reduce risk of respiratory infection  Referral placed for pharmacist for help with cost of Paxlovid, pt does not have stiolto respimat and is unable to take mucinex in pill form. (in basket message also sent) Referral placed for care guide for assistance / resources obtaining hearing aides Reviewed medications with patient Self-Care Activities:  Attends all scheduled provider  appointments Calls pharmacy for medication refills  Calls provider office for new concerns or questions Patient Goals: - develop a rescue plan - eliminate symptom triggers at home  (dust, strong scents, pet dander, etc) - follow rescue plan if symptoms flare-up, call your doctor early on for change in health status, symptoms, call 911 if needed for emergency such as unrelieved shortness of breath - keep follow-up appointments - use an extra pillow to sleep  - expect a call from pharmacist about the cost of Paxlovid (medicine you are to take for  Covid) - expect a call from care guide for resources for hearing aides - take all medications and use inhalers as prescrib - develop a new routine to improve sleep - get at least 7 to 8 hours of sleep at night - get outdoors every day (weather permitting) - keep room cool and dark - limit daytime naps - practice relaxation or meditation daily - use a fan or white noise in bedroom Follow Up Plan: Telephone follow up appointment with care management team member scheduled for:   03/08/2021    Task: Identify and Minimize Risk of COPD Exacerbation      Patient Care Plan: Cancer Treatment Phase (Adult)     Problem Identified: Fatigue/ malnutrition related to cancer   Priority: High     Long-Range Goal: Fatigue / malnutrition Managed   Start Date: 02/24/2021  Expected End Date: 08/24/2021  This Visit's Progress: On track  Priority: High  Note:   Current Barriers:  Ineffective Self Health Maintenance in a patient with thyroid cancer/ malignant esophageal dysphagia/ malignant tracheal obstruction managed by Si [facility]ider DO at [facility].  Pt has refused hospice and wishes to continue with treatment. Unable to independently complete all ADL's without some assistance due to be tired/ fatigued most of the time. Patient's son and daughter assist her as needed.  Pt reports "slight"  difficulty swallowing and is drinking 2 Ensure daily and trying to eat  high calorie small frequent meals.  Pt is worried about "I'm going to be taking pills for my cancer very soon"  she is fearful for the overall effect on her body.  Patient does not feel she needs services of LCSW at this time (financial concerns and overwhelmed with cancer diagnosis) and states " it's too much and I'm tired of being bothered" Does not adhere to provider recommendations re: does not have 2 medications and did not call health care provider for assistance Clinical Goal(s):  Collaboration with Anabel Halon, MD regarding development and update of comprehensive plan of care as evidenced by provider attestation and co-signature Inter-disciplinary care team collaboration (see longitudinal plan of care) patient will work with care management team to address care coordination and chronic disease management needs related to Disease Management Educational Needs Care Coordination Medication Management and Education   Interventions:  Evaluation of current treatment plan related to cancer, self-management and patient's adherence to plan as established by provider. Collaboration with Anabel Halon, MD regarding development and update of comprehensive plan of care as evidenced by provider attestation       and co-signature Inter-disciplinary care team collaboration (see longitudinal plan of care) Discussed plans with patient for ongoing care management follow up and provided patient with direct contact information for care management team Reviwed all medications with patient Sent referrals to pharmacist and care guide Mailed copy of education- cancer care Reviewed energy conservation Reviewed importance of staying in touch with your doctor Reviewed all upcoming scheduled appointments-  10/4 Dr. Allena Katz,  11/4  Dr. [name],  11/7  Dr. Dorris Fetch.  12/15 Dr. Posey Pronto Reviewed precuations to prevent infection- handwashing, wearing a mask as needed, eating as healthy as possible, adequate fluids and  rest. Self Care Activities:  Attends all scheduled provider appointments Calls provider office for new concerns or questions Patient Goals: - develop a new routine to improve sleep - eat healthy - get at least 8 hours of sleep at night - get outdoors every day (weather permitting) - keep the bedroom cool and dark - limit daytime naps - use meditation or relaxation techniques  - keep up to date on immunizations - practice good handwashing and wear a mask as needed Follow Up Plan: Telephone follow up appointment with care management team member scheduled for:   03/08/2021

## 2021-02-24 NOTE — Chronic Care Management (AMB) (Signed)
Chronic Care Management   CCM RN Visit Note  02/24/2021 Name: Marilyn Solomon MRN: [id number] DOB: 01-07-1946  Subjective: Marilyn Solomon is a 75 y.o. year old female who is a primary care patient of Lindell Spar, MD. The care management team was consulted for assistance with disease management and care coordination needs.    Engaged with patient by telephone for initial visit in response to provider referral for case management and/or care coordination services.   Consent to Services:  The patient was given the following information about Chronic Care Management services today, agreed to services, and gave verbal consent: 1. CCM service includes personalized support from designated clinical staff supervised by the primary care provider, including individualized plan of care and coordination with other care providers 2. 24/7 contact phone numbers for assistance for urgent and routine care needs. 3. Service will only be billed when office clinical staff spend 20 minutes or more in a month to coordinate care. 4. Only one practitioner may furnish and bill the service in a calendar month. 5.The patient may stop CCM services at any time (effective at the end of the month) by phone call to the office staff. 6. The patient will be responsible for cost sharing (co-pay) of up to 20% of the service fee (after annual deductible is met). Patient agreed to services and consent obtained.  Patient agreed to services and verbal consent obtained.   Assessment: Review of patient past medical history, allergies, medications, health status, including review of consultants reports, laboratory and other test data, was performed as part of comprehensive evaluation and provision of chronic care management services.   SDOH (Social Determinants of Health) assessments and interventions performed:    CCM Care Plan  Allergies  Allergen Reactions   Penicillins Itching     Tolerated Ancef Intraop 09/13/20    Vicodin [Hydrocodone-Acetaminophen] Nausea And Vomiting    Outpatient Encounter Medications as of 02/24/2021  Medication Sig   albuterol (PROVENTIL) (2.5 MG/3ML) 0.083% nebulizer solution Take 3 mLs (2.5 mg total) by nebulization every 6 (six) hours as needed for wheezing or shortness of breath.   albuterol (VENTOLIN HFA) 108 (90 Base) MCG/ACT inhaler Inhale 1-2 puffs into the lungs every 6 (six) hours as needed for wheezing or shortness of breath.   ALPRAZolam (XANAX) 1 MG tablet Take 1 tablet (1 mg total) by mouth every 8 (eight) hours as needed for anxiety.   [START ON 02/28/2021] amLODipine (NORVASC) 5 MG tablet Take 1 tablet (5 mg total) by mouth daily.   levothyroxine (SYNTHROID) 75 MCG tablet Take 1 tablet (75 mcg total) by mouth daily.   ondansetron (ZOFRAN) 8 MG tablet Take 8 mg by mouth every 8 (eight) hours as needed for nausea/vomiting.   oxyCODONE (ROXICODONE) 15 MG immediate release tablet TAKE (1) TABLET BY MOUTH EVERY 8 HOURS AS NEEDED. (Patient taking differently: Take 7.5-15 mg by mouth every 8 (eight) hours as needed (severe pain).)   pantoprazole (PROTONIX) 40 MG tablet Take 1 tablet (40 mg total) by mouth daily.   predniSONE (DELTASONE) 20 MG tablet Take 2 tablets (40 mg total) by mouth daily with breakfast for 5 days.   promethazine-dextromethorphan (PROMETHAZINE-DM) 6.25-15 MG/5ML syrup Take 5 mLs by mouth 4 (four) times daily as needed for cough.   Vitamin D, Ergocalciferol, (DRISDOL) 1.25 MG (50000 UNIT) CAPS capsule Take 50,000 Units by mouth every Sunday.   guaiFENesin (MUCINEX) 600 MG 12 hr tablet Take 1 tablet (600 mg total) by mouth 2 (two) times  daily. (Patient not taking: Reported on 02/24/2021)   nirmatrelvir & ritonavir (PAXLOVID, 300/100,) 20 x 150 MG & 10 x $Re'100MG'dTx$  TBPK Take as instructed for 5 days (Patient not taking: Reported on 02/24/2021)   Tiotropium Bromide-Olodaterol (STIOLTO RESPIMAT) 2.5-2.5 MCG/ACT AERS Inhale 2 puffs into the lungs daily. (Patient not  taking: Reported on 02/24/2021)   No facility-administered encounter medications on file as of 02/24/2021.    Patient Active Problem List   Diagnosis Date Noted   COVID-19 virus infection 02/21/2021   Complete paralysis of right vocal cord 08/27/2020   Malignant neoplasm of thyroid gland ([place]) 07/08/2020   Other chronic pain 06/17/2020   Multinodular goiter 06/10/2020   Neck mass 06/03/2020   Hearing abnormally acute, right 06/03/2020   Chronic respiratory failure with hypoxia and hypercapnia ([facility]) 05/11/2020   Irritable bowel syndrome with atypical cp  05/11/2020   Acute kidney injury superimposed on CKD ([place]) 01/23/2020   Hypertension 06/02/2018   Positive colorectal cancer screening using Cologuard test 08/21/2017   Normocytic anemia    Anxiety 06/30/2017   Hypothyroidism 06/30/2017   GERD (gastroesophageal reflux disease) 06/30/2017   COPD with acute exacerbation ([place]) 06/30/2017   Elevated liver enzymes 06/30/2017   Severe Iron deficiency anemia 06/30/2017   Second degree hemorrhoids     Conditions to be addressed/monitored:COPD,  Cancer  Care Plan : COPD (Adult)  Updates made by Kassie Mends, RN since 02/24/2021 12:00 AM     Problem: Symptom Exacerbation (COPD)   Priority: High     Long-Range Goal: Symptom Exacerbation Prevented or Minimized   Start Date: 02/24/2021  Expected End Date: 08/24/2021  This Visit's Progress: On track  Priority: High  Note:   Current Barriers:  Knowledge deficits related to basic understanding of COPD disease process- needs reinforcement of COPD action plan with emphasis on yellow zone, pt reports she lives with her son and granddaughter, her car is broken down, her son provides transportation, pt states she has pulse oximeter and checks frequently with readings 98-100%.  Uses oxygen continuously at 2 liters via [place].  Pt is drinking 2 ensure daily and trying to eat small meals due to malignant esophageal dysphagia and tracheal obstruction.   Pt reports she does not have a feeding tube although this had been discussed, patient refused hospice at present.  Pt reports she cannot afford hearing aides and would like any resources for this. Knowledge deficits related to basic COPD self care/management Cannot afford prescribed medications- unable to afford paxlovid Does not adhere to prescribed medication regimen- does not have paxlovid or stiolto respimat inhaler and states (not sure why I don't have that, unsure what that is)  is unable to take mucinex because the pill is too large and she chokes Unable to perform ADLs independently- family assists as needed, patient reports she is mostly independent but is "tired due to COPD and cancer" Does not contact provider office for questions/concerns- pt has not informed her doctor of not taking paxlovid or issues with affordability, has not reported she is unable to take municex Case Manager Clinical Goal(s): patient will report using inhalers as prescribed including rinsing mouth after use patient will report utilizing pursed lip breathing for shortness of breath patient will verbalize understanding of COPD action plan and when to seek appropriate levels of medical care patient will verbalize basic understanding of COPD disease process and self care activities  Interventions:  Collaboration with Lindell Spar, MD regarding development and update of comprehensive plan of care  as evidenced by provider attestation and co-signature Inter-disciplinary care team collaboration (see longitudinal plan of care) Provided (mailed) patient with basic written and verbal COPD education on self care/management/and exacerbation prevention  Provided patient with COPD action plan and reinforced importance of daily self assessment Provided instruction about proper use of medications used for management of COPD including inhalers Advised patient to self assesses COPD action plan zone and make appointment with  provider if in the yellow zone for 48 hours without improvement. Provided education about and advised patient to utilize infection prevention strategies to reduce risk of respiratory infection  Referral placed for pharmacist for help with cost of Paxlovid, pt does not have stiolto respimat and is unable to take mucinex in pill form. (in basket message also sent) Referral placed for care guide for assistance / resources obtaining hearing aides Reviewed medications with patient Self-Care Activities:  Attends all scheduled provider appointments Calls pharmacy for medication refills Calls provider office for new concerns or questions Patient Goals: - develop a rescue plan - eliminate symptom triggers at home  (dust, strong scents, pet dander, etc) - follow rescue plan if symptoms flare-up, call your doctor early on for change in health status, symptoms, call 911 if needed for emergency such as unrelieved shortness of breath - keep follow-up appointments - use an extra pillow to sleep  - expect a call from pharmacist about the cost of Paxlovid (medicine you are to take for  Covid) - expect a call from care guide for resources for hearing aides - take all medications and use inhalers as prescrib - develop a new routine to improve sleep - get at least 7 to 8 hours of sleep at night - get outdoors every day (weather permitting) - keep room cool and dark - limit daytime naps - practice relaxation or meditation daily - use a fan or white noise in bedroom Follow Up Plan: Telephone follow up appointment with care management team member scheduled for:   03/08/2021    Task: Identify and Minimize Risk of COPD Exacerbation      Care Plan : Cancer Treatment Phase (Adult)  Updates made by Kassie Mends, RN since 02/24/2021 12:00 AM     Problem: Fatigue/ malnutrition related to cancer   Priority: High     Long-Range Goal: Fatigue / malnutrition Managed   Start Date: 02/24/2021  Expected End  Date: 08/24/2021  This Visit's Progress: On track  Priority: High  Note:   Current Barriers:  Ineffective Self Health Maintenance in a patient with thyroid cancer/ malignant esophageal dysphagia/ malignant tracheal obstruction managed by Cyril Loosen DO at [facility] has refused hospice and wishes to continue with treatment. Unable to independently complete all ADL's without some assistance due to be tired/ fatigued most of the time. Patient's son and daughter assist her as needed.  Pt reports "slight"  difficulty swallowing and is drinking 2 Ensure daily and trying to eat high calorie small frequent meals.  Pt is worried about "I'm going to be taking pills for my cancer very soon"  she is fearful for the overall effect on her body.  Patient does not feel she needs services of LCSW at this time (financial concerns and overwhelmed with cancer diagnosis) and states " it's too much and I'm tired of being bothered" Does not adhere to provider recommendations re: does not have 2 medications and did not call health care provider for assistance Clinical Goal(s):  Collaboration with Lindell Spar, MD regarding development and  update of comprehensive plan of care as evidenced by provider attestation and co-signature Inter-disciplinary care team collaboration (see longitudinal plan of care) patient will work with care management team to address care coordination and chronic disease management needs related to Disease Management Educational Needs Care Coordination Medication Management and Education   Interventions:  Evaluation of current treatment plan related to cancer, self-management and patient's adherence to plan as established by provider. Collaboration with Lindell Spar, MD regarding development and update of comprehensive plan of care as evidenced by provider attestation       and co-signature Inter-disciplinary care team collaboration (see longitudinal plan of care) Discussed plans with  patient for ongoing care management follow up and provided patient with direct contact information for care management team Reviwed all medications with patient Sent referrals to pharmacist and care guide Mailed copy of education- cancer care Reviewed energy conservation Reviewed importance of staying in touch with your doctor Reviewed all upcoming scheduled appointments-  10/4 Dr. Posey Pronto,  11/4  Dr. [name],  11/7  Dr. Dorris Fetch.  12/15 Dr. Posey Pronto Reviewed precuations to prevent infection- handwashing, wearing a mask as needed, eating as healthy as possible, adequate fluids and rest. Self Care Activities:  Attends all scheduled provider appointments Calls provider office for new concerns or questions Patient Goals: - develop a new routine to improve sleep - eat healthy - get at least 8 hours of sleep at night - get outdoors every day (weather permitting) - keep the bedroom cool and dark - limit daytime naps - use meditation or relaxation techniques  - keep up to date on immunizations - practice good handwashing and wear a mask as needed Follow Up Plan: Telephone follow up appointment with care management team member scheduled for:   03/08/2021     Plan:Telephone follow up appointment with care management team member scheduled for:  03/08/2021  Jacqlyn Larsen [facility], BSN RN Case Manager [facility] [phone]

## 2021-02-25 ENCOUNTER — Encounter: Payer: Self-pay | Admitting: *Deleted

## 2021-02-25 ENCOUNTER — Ambulatory Visit: Payer: Self-pay | Admitting: *Deleted

## 2021-02-25 DIAGNOSIS — J440 Chronic obstructive pulmonary disease with acute lower respiratory infection: Secondary | ICD-10-CM

## 2021-02-25 DIAGNOSIS — J209 Acute bronchitis, unspecified: Secondary | ICD-10-CM | POA: Diagnosis not present

## 2021-02-25 DIAGNOSIS — J441 Chronic obstructive pulmonary disease with (acute) exacerbation: Secondary | ICD-10-CM | POA: Diagnosis not present

## 2021-02-25 NOTE — Patient Instructions (Signed)
Visit Information   Goals Addressed             This Visit's Progress    Track and Manage My Symptoms-COPD       Timeframe:  Long-Range Goal Priority:  High Start Date:     02/24/2021                        Expected End Date:      08/24/2021                 Follow Up Date 03/08/2021    - develop a rescue plan - eliminate symptom triggers at home  (dust, strong scents, pet dander, etc) - follow rescue plan if symptoms flare-up, call your doctor early on for change in health status, symptoms, call 911 if needed for emergency such as unrelieved shortness of breath - keep follow-up appointments - use an extra pillow to sleep  - expect a call from pharmacist about the cost of Paxlovid (medicine you are to take for  Covid) - expect a call from care guide for resources for hearing aides - take all medications and use inhalers as prescribed  - take paxlovid x 5 days, per discharge instructions, do not take amlodipine while taking paxlovid - per pharmacist, ok to use liquid mucinex  Why is this important?   Tracking your symptoms and other information about your health helps your doctor plan your care.  Write down the symptoms, the time of day, what you were doing and what medicine you are taking.  You will soon learn how to manage your symptoms.     Notes:         The patient verbalized understanding of instructions, educational materials, and care plan provided today and declined offer to receive copy of patient instructions, educational materials, and care plan.   Telephone follow up appointment with care management team member scheduled for:   03/08/2021  Jacqlyn Larsen [facility], BSN RN Case Manager [facility] [phone]

## 2021-02-25 NOTE — Chronic Care Management (AMB) (Signed)
Care Management    RN Visit Note  02/25/2021 Name: Marilyn Solomon MRN: [id number] DOB: Jun 30, 1945  Subjective: Marilyn Solomon is a 75 y.o. year old female who is a primary care patient of Marilyn Spar, MD. The care management team was consulted for assistance with disease management and care coordination needs.    Engaged with patient by telephone for follow up visit in response to provider referral for case management and/or care coordination services.   Consent to Services:   Marilyn Solomon was given information about Care Management services today including:  Care Management services includes personalized support from designated clinical staff supervised by her physician, including individualized plan of care and coordination with other care providers 24/7 contact phone numbers for assistance for urgent and routine care needs. The patient may stop case management services at any time by phone call to the office staff.  Patient agreed to services and consent obtained.   Assessment: Review of patient past medical history, allergies, medications, health status, including review of consultants reports, laboratory and other test data, was performed as part of comprehensive evaluation and provision of chronic care management services.   SDOH (Social Determinants of Health) assessments and interventions performed:    Care Plan  Allergies  Allergen Reactions   Penicillins Itching     Tolerated Ancef Intraop 09/13/20   Vicodin [Hydrocodone-Acetaminophen] Nausea And Vomiting    Outpatient Encounter Medications as of 02/25/2021  Medication Sig   albuterol (PROVENTIL) (2.5 MG/3ML) 0.083% nebulizer solution Take 3 mLs (2.5 mg total) by nebulization every 6 (six) hours as needed for wheezing or shortness of breath.   albuterol (VENTOLIN HFA) 108 (90 Base) MCG/ACT inhaler Inhale 1-2 puffs into the lungs every 6 (six) hours as needed for wheezing or shortness of breath.   ALPRAZolam  (XANAX) 1 MG tablet Take 1 tablet (1 mg total) by mouth every 8 (eight) hours as needed for anxiety.   [START ON 02/28/2021] amLODipine (NORVASC) 5 MG tablet Take 1 tablet (5 mg total) by mouth daily.   guaiFENesin (MUCINEX) 600 MG 12 hr tablet Take 1 tablet (600 mg total) by mouth 2 (two) times daily. (Patient not taking: Reported on 02/24/2021)   levothyroxine (SYNTHROID) 75 MCG tablet Take 1 tablet (75 mcg total) by mouth daily.   nirmatrelvir & ritonavir (PAXLOVID, 300/100,) 20 x 150 MG & 10 x 100MG  TBPK Take as instructed for 5 days (Patient not taking: Reported on 02/24/2021)   ondansetron (ZOFRAN) 8 MG tablet Take 8 mg by mouth every 8 (eight) hours as needed for nausea/vomiting.   oxyCODONE (ROXICODONE) 15 MG immediate release tablet TAKE (1) TABLET BY MOUTH EVERY 8 HOURS AS NEEDED. (Patient taking differently: Take 7.5-15 mg by mouth every 8 (eight) hours as needed (severe pain).)   pantoprazole (PROTONIX) 40 MG tablet Take 1 tablet (40 mg total) by mouth daily.   predniSONE (DELTASONE) 20 MG tablet Take 2 tablets (40 mg total) by mouth daily with breakfast for 5 days.   promethazine-dextromethorphan (PROMETHAZINE-DM) 6.25-15 MG/5ML syrup Take 5 mLs by mouth 4 (four) times daily as needed for cough.   Tiotropium Bromide-Olodaterol (STIOLTO RESPIMAT) 2.5-2.5 MCG/ACT AERS Inhale 2 puffs into the lungs daily. (Patient not taking: Reported on 02/24/2021)   Vitamin D, Ergocalciferol, (DRISDOL) 1.25 MG (50000 UNIT) CAPS capsule Take 50,000 Units by mouth every Sunday.   No facility-administered encounter medications on file as of 02/25/2021.    Patient Active Problem List   Diagnosis Date Noted  COVID-19 virus infection 02/21/2021   Complete paralysis of right vocal cord 08/27/2020   Malignant neoplasm of thyroid gland ([place]) 07/08/2020   Other chronic pain 06/17/2020   Multinodular goiter 06/10/2020   Neck mass 06/03/2020   Hearing abnormally acute, right 06/03/2020   Chronic respiratory  failure with hypoxia and hypercapnia ([facility]) 05/11/2020   Irritable bowel syndrome with atypical cp  05/11/2020   Acute kidney injury superimposed on CKD ([place]) 01/23/2020   Hypertension 06/02/2018   Positive colorectal cancer screening using Cologuard test 08/21/2017   Normocytic anemia    Anxiety 06/30/2017   Hypothyroidism 06/30/2017   GERD (gastroesophageal reflux disease) 06/30/2017   COPD with acute exacerbation ([place]) 06/30/2017   Elevated liver enzymes 06/30/2017   Severe Iron deficiency anemia 06/30/2017   Second degree hemorrhoids     Conditions to be addressed/monitored: COPD  Care Plan : COPD (Adult)  Updates made by Kassie Mends, RN since 02/25/2021 12:00 AM     Problem: Symptom Exacerbation (COPD)   Priority: High     Long-Range Goal: Symptom Exacerbation Prevented or Minimized   Start Date: 02/24/2021  Expected End Date: 08/24/2021  Recent Progress: On track  Priority: High  Note:   Current Barriers:  Knowledge deficits related to basic understanding of COPD disease process- needs reinforcement of COPD action plan with emphasis on yellow zone, pt reports she lives with her son and granddaughter, her car is broken down, her son provides transportation, pt states she has pulse oximeter and checks frequently with readings 98-100%.  Uses oxygen continuously at 2 liters via [place].  Pt is drinking 2 ensure daily and trying to eat small meals due to malignant esophageal dysphagia and tracheal obstruction.  Pt reports she does not have a feeding tube although this had been discussed, patient refused hospice at present.  Pt reports she cannot afford hearing aides and would like any resources for this. Knowledge deficits related to basic COPD self care/management Cannot afford prescribed medications- unable to afford paxlovid Does not adhere to prescribed medication regimen- does not have paxlovid or stiolto respimat inhaler and states (not sure why I don't have that, unsure what  that is)  is unable to take mucinex because the pill is too large and she chokes Unable to perform ADLs independently- family assists as needed, patient reports she is mostly independent but is "tired due to COPD and cancer" Does not contact provider office for questions/concerns- pt has not informed her doctor of not taking paxlovid or issues with affordability, has not reported she is unable to take municex Case Manager Clinical Goal(s): patient will report using inhalers as prescribed including rinsing mouth after use patient will report utilizing pursed lip breathing for shortness of breath patient will verbalize understanding of COPD action plan and when to seek appropriate levels of medical care patient will verbalize basic understanding of COPD disease process and self care activities  Interventions:  Collaboration with Marilyn Spar, MD regarding development and update of comprehensive plan of care as evidenced by provider attestation and co-signature Inter-disciplinary care team collaboration (see longitudinal plan of care) Provided (mailed) patient with basic written and verbal COPD education on self care/management/and exacerbation prevention  Provided patient with COPD action plan and reinforced importance of daily self assessment Provided instruction about proper use of medications used for management of COPD including inhalers Advised patient to self assesses COPD action plan zone and make appointment with provider if in the yellow zone for 48 hours without improvement. Provided  education about and advised patient to utilize infection prevention strategies to reduce risk of respiratory infection  Referral placed for pharmacist for help with cost of Paxlovid, pt does not have stiolto respimat and is unable to take mucinex in pill form. (in basket message also sent) Referral placed for care guide for assistance / resources obtaining hearing aides Reviewed medications with  patient Pharmacist Gerre Pebbles sent message stating pt did pickup paxlovid as confirmed by her pharmacy and this medication was free, also reports okay for pt to use liquid mucinex Pt did not answer telephone (home #), daughter answered cell phone [name]) and reports she will relay to above information to her mother and make sure she is taking paxlovid (and not to take amlodipine while taking paxlovid) per discharge instructions Reviewed pharmacist will follow up upon return back to work Talked with care guide Laverda Sorenson who is aware to schedule a visit for pharmacist Self-Care Activities:  Attends all scheduled provider appointments Calls pharmacy for medication refills Calls provider office for new concerns or questions Patient Goals: - develop a rescue plan - eliminate symptom triggers at home  (dust, strong scents, pet dander, etc) - follow rescue plan if symptoms flare-up, call your doctor early on for change in health status, symptoms, call 911 if needed for emergency such as unrelieved shortness of breath - keep follow-up appointments - use an extra pillow to sleep  - expect a call from pharmacist about the cost of Paxlovid (medicine you are to take for  Covid) - expect a call from care guide for resources for hearing aides - take all medications and use inhalers as prescrib  - take paxlovid x 5 days, per discharge instructions, do not take amlodipine while taking paxlovid - per pharmacist, ok to use liquid mucinex - develop a new routine to improve sleep - get at least 7 to 8 hours of sleep at night - get outdoors every day (weather permitting) - keep room cool and dark - limit daytime naps - practice relaxation or meditation daily - use a fan or white noise in bedroom Follow Up Plan: Telephone follow up appointment with care management team member scheduled for:   03/08/2021     Plan: Telephone follow up appointment with care management team member scheduled for:   03/08/2021  Jacqlyn Larsen [facility], BSN RN Case Manager [facility] [phone]

## 2021-03-01 ENCOUNTER — Inpatient Hospital Stay: Payer: Medicare Other | Admitting: Internal Medicine

## 2021-03-01 ENCOUNTER — Telehealth: Payer: Self-pay | Admitting: *Deleted

## 2021-03-01 NOTE — Chronic Care Management (AMB) (Signed)
  Chronic Care Management   Note  03/01/2021 Name: Marilyn Solomon MRN: [id number] DOB: [birth year]  Marilyn Solomon is a 75 y.o. year old female who is a primary care patient of Posey Pronto, Colin Broach, MD. Marilyn Solomon is currently enrolled in care management services. An additional referral for Pharmacy was placed.   Follow up plan: Unsuccessful telephone outreach attempt made. The care management team will reach out to the patient again over the next 7 days. If patient returns call to provider office, please advise to call [facility] at [phone].  [facility]  Direct Dial: [phone]

## 2021-03-02 ENCOUNTER — Encounter (HOSPITAL_COMMUNITY): Payer: Self-pay

## 2021-03-03 DIAGNOSIS — C73 Malignant neoplasm of thyroid gland: Secondary | ICD-10-CM | POA: Diagnosis not present

## 2021-03-04 ENCOUNTER — Other Ambulatory Visit: Payer: Self-pay | Admitting: Internal Medicine

## 2021-03-04 DIAGNOSIS — E890 Postprocedural hypothyroidism: Secondary | ICD-10-CM

## 2021-03-08 ENCOUNTER — Ambulatory Visit (INDEPENDENT_AMBULATORY_CARE_PROVIDER_SITE_OTHER): Payer: Medicare Other | Admitting: *Deleted

## 2021-03-08 DIAGNOSIS — C73 Malignant neoplasm of thyroid gland: Secondary | ICD-10-CM

## 2021-03-08 DIAGNOSIS — J441 Chronic obstructive pulmonary disease with (acute) exacerbation: Secondary | ICD-10-CM

## 2021-03-08 NOTE — Patient Instructions (Signed)
Visit Information  PATIENT GOALS:  Goals Addressed             This Visit's Progress    Manage Fatigue/ Tiredness related to cancer       Timeframe:  Long-Range Goal Priority:  High Start Date:           02/24/2021                  Expected End Date:    08/24/2021                   Follow Up Date 04/07/2021   - eat healthy - talk to your doctor if you have nausea - get at least 8 hours of sleep at night - try to get outdoors every day (weather permitting) - keep the bedroom cool and dark - limit daytime naps - use meditation or relaxation techniques  - keep up to date on immunizations - practice good handwashing and wear a mask as needed - continue to drink ensure and consume adequate protein - change positions q 2 hours     Why is this important?   Cancer treatment and its side effects can drain your energy. It can keep you from doing things you would like to do.  There are many things that can be done to manage fatigue.    Notes:      Track and Manage My Symptoms-COPD       Timeframe:  Long-Range Goal Priority:  High Start Date:     02/24/2021                        Expected End Date:      08/24/2021                 Follow Up Date 04/07/2021   - follow COPD action plan with emphasis on yellow zone - eliminate symptom triggers at home  (dust, strong scents, pet dander, etc) - follow rescue plan if symptoms flare-up, call your doctor early on for change in health status, symptoms, call 911 if needed for emergency such as unrelieved shortness of breath - keep all follow-up appointments and reschedule your visit with Dr. Posey Pronto - use an extra pillow to sleep  - expect a call from pharmacist about assistance of Stiolto inhaler - expect a call from care guide for resources for hearing aides - take all medications and use inhalers as prescribed    Why is this important?   Tracking your symptoms and other information about your health helps your doctor plan your care.   Write down the symptoms, the time of day, what you were doing and what medicine you are taking.  You will soon learn how to manage your symptoms.     Notes:         The patient verbalized understanding of instructions, educational materials, and care plan provided today and declined offer to receive copy of patient instructions, educational materials, and care plan.   Telephone follow up appointment with care management team member scheduled for:  04/07/2021  Jacqlyn Larsen [facility], BSN RN Case Manager [facility] [phone]

## 2021-03-08 NOTE — Chronic Care Management (AMB) (Signed)
Chronic Care Management   CCM RN Visit Note  03/08/2021 Name: Marilyn Solomon MRN: [id number] DOB: 07/21/1945  Subjective: Marilyn Solomon is a 75 y.o. year old female who is a primary care patient of Lindell Spar, MD. The care management team was consulted for assistance with disease management and care coordination needs.    Engaged with patient by telephone for follow up visit in response to provider referral for case management and/or care coordination services.   Consent to Services:  The patient was given information about Chronic Care Management services, agreed to services, and gave verbal consent prior to initiation of services.  Please see initial visit note for detailed documentation.   Patient agreed to services and verbal consent obtained.   Assessment: Review of patient past medical history, allergies, medications, health status, including review of consultants reports, laboratory and other test data, was performed as part of comprehensive evaluation and provision of chronic care management services.   SDOH (Social Determinants of Health) assessments and interventions performed:    CCM Care Plan  Allergies  Allergen Reactions   Penicillins Itching     Tolerated Ancef Intraop 09/13/20   Vicodin [Hydrocodone-Acetaminophen] Nausea And Vomiting    Outpatient Encounter Medications as of 03/08/2021  Medication Sig   albuterol (PROVENTIL) (2.5 MG/3ML) 0.083% nebulizer solution Take 3 mLs (2.5 mg total) by nebulization every 6 (six) hours as needed for wheezing or shortness of breath.   albuterol (VENTOLIN HFA) 108 (90 Base) MCG/ACT inhaler Inhale 1-2 puffs into the lungs every 6 (six) hours as needed for wheezing or shortness of breath.   ALPRAZolam (XANAX) 1 MG tablet Take 1 tablet (1 mg total) by mouth every 8 (eight) hours as needed for anxiety.   amLODipine (NORVASC) 5 MG tablet Take 1 tablet (5 mg total) by mouth daily.   guaiFENesin (MUCINEX) 600 MG 12 hr  tablet Take 1 tablet (600 mg total) by mouth 2 (two) times daily. (Patient not taking: Reported on 02/24/2021)   levothyroxine (SYNTHROID) 75 MCG tablet TAKE ONE TABLET BY MOUTH ONCE DAILY.   nirmatrelvir & ritonavir (PAXLOVID, 300/100,) 20 x 150 MG & 10 x 100MG  TBPK Take as instructed for 5 days (Patient not taking: Reported on 02/24/2021)   ondansetron (ZOFRAN) 8 MG tablet Take 8 mg by mouth every 8 (eight) hours as needed for nausea/vomiting.   oxyCODONE (ROXICODONE) 15 MG immediate release tablet TAKE (1) TABLET BY MOUTH EVERY 8 HOURS AS NEEDED. (Patient taking differently: Take 7.5-15 mg by mouth every 8 (eight) hours as needed (severe pain).)   pantoprazole (PROTONIX) 40 MG tablet Take 1 tablet (40 mg total) by mouth daily.   promethazine-dextromethorphan (PROMETHAZINE-DM) 6.25-15 MG/5ML syrup Take 5 mLs by mouth 4 (four) times daily as needed for cough.   Tiotropium Bromide-Olodaterol (STIOLTO RESPIMAT) 2.5-2.5 MCG/ACT AERS Inhale 2 puffs into the lungs daily. (Patient not taking: Reported on 02/24/2021)   Vitamin D, Ergocalciferol, (DRISDOL) 1.25 MG (50000 UNIT) CAPS capsule Take 50,000 Units by mouth every Sunday.   No facility-administered encounter medications on file as of 03/08/2021.    Patient Active Problem List   Diagnosis Date Noted   COVID-19 virus infection 02/21/2021   Complete paralysis of right vocal cord 08/27/2020   Malignant neoplasm of thyroid gland ([facility]) 07/08/2020   Other chronic pain 06/17/2020   Multinodular goiter 06/10/2020   Neck mass 06/03/2020   Hearing abnormally acute, right 06/03/2020   Chronic respiratory failure with hypoxia and hypercapnia ([place] [facility]) 05/11/2020   Irritable  bowel syndrome with atypical cp  05/11/2020   Acute kidney injury superimposed on CKD ([place]) 01/23/2020   Hypertension 06/02/2018   Positive colorectal cancer screening using Cologuard test 08/21/2017   Normocytic anemia    Anxiety 06/30/2017   Hypothyroidism 06/30/2017   GERD  (gastroesophageal reflux disease) 06/30/2017   COPD with acute exacerbation ([facility]) 06/30/2017   Elevated liver enzymes 06/30/2017   Severe Iron deficiency anemia 06/30/2017   Second degree hemorrhoids     Conditions to be addressed/monitored:COPD, thyroid cancer  Care Plan : COPD (Adult)  Updates made by Kassie Mends, RN since 03/08/2021 12:00 AM     Problem: Symptom Exacerbation (COPD)   Priority: High     Long-Range Goal: Symptom Exacerbation Prevented or Minimized   Start Date: 02/24/2021  Expected End Date: 08/24/2021  This Visit's Progress: On track  Recent Progress: On track  Priority: High  Note:   Current Barriers:  Knowledge deficits related to basic understanding of COPD disease process- needs reinforcement of COPD action plan with emphasis on yellow zone, pt reports she lives with her son and granddaughter, her car is broken down, her son provides transportation, pt states she has pulse oximeter and checks frequently with readings 98-100%.  Uses oxygen continuously at 2 liters via [place].  Pt is drinking 2 ensure daily and trying to eat small meals due to malignant esophageal dysphagia and tracheal obstruction.  Pt reports she does not have a feeding tube although this had been discussed, patient refused hospice at present.  Pt reports she cannot afford hearing aides and would like any resources for this.  Pt reports she did not attend her scheduled primary care provider appointment on 10/4. Knowledge deficits related to basic COPD self care/management Cannot afford prescribed medications- unable to afford stiolto respimat inhaler Does not adhere to prescribed medication regimen- does not have stiolto respimat inhaler and is involved with pharmacist Unable to perform ADLs independently- family assists as needed, patient reports she is mostly independent but is "tired due to COPD and cancer" Does not contact provider office for questions/concerns Case Manager Clinical  Goal(s): patient will report using inhalers as prescribed including rinsing mouth after use patient will report utilizing pursed lip breathing for shortness of breath patient will verbalize understanding of COPD action plan and when to seek appropriate levels of medical care patient will verbalize basic understanding of COPD disease process and self care activities  Interventions:  Collaboration with Lindell Spar, MD regarding development and update of comprehensive plan of care as evidenced by provider attestation and co-signature Inter-disciplinary care team collaboration (see longitudinal plan of care) Reinforced with patient with basic written and verbal COPD education on self care/management/and exacerbation prevention  Provided patient with COPD action plan and reinforced importance of daily self assessment Reinforced instruction about proper use of medications used for management of COPD including inhalers Reinforced with patient to self assesses COPD action plan zone and make appointment with provider if in the yellow zone for 48 hours without improvement. Reinforced education about and advised patient to utilize infection prevention strategies to reduce risk of respiratory infection Reviewed medications with patient  Encouraged patient to reschedule her primary care provider appointment Self-Care Activities:  Attends all scheduled provider appointments Calls pharmacy for medication refills Calls provider office for new concerns or questions Patient Goals: - follow COPD action plan with emphasis on yellow zone - eliminate symptom triggers at home  (dust, strong scents, pet dander, etc) - follow rescue plan if symptoms  flare-up, call your doctor early on for change in health status, symptoms, call 911 if needed for emergency such as unrelieved shortness of breath - keep all follow-up appointments and reschedule your visit with Dr. Posey Pronto - use an extra pillow to sleep  - expect a  call from pharmacist about assistance of Stiolto inhaler - expect a call from care guide for resources for hearing aides - take all medications and use inhalers as prescribed - develop a new routine to improve sleep - get at least 7 to 8 hours of sleep at night - get outdoors every day (weather permitting) - keep room cool and dark - limit daytime naps - practice relaxation or meditation daily - use a fan or white noise in bedroom Follow Up Plan: Telephone follow up appointment with care management team member scheduled for:   04/07/2021    Care Plan : Cancer Treatment Phase (Adult)  Updates made by Kassie Mends, RN since 03/08/2021 12:00 AM     Problem: Fatigue/ malnutrition related to cancer   Priority: High     Long-Range Goal: Fatigue / malnutrition Managed   Start Date: 02/24/2021  Expected End Date: 08/24/2021  This Visit's Progress: On track  Recent Progress: On track  Priority: High  Note:   Current Barriers:  Ineffective Self Health Maintenance in a patient with thyroid cancer/ malignant esophageal dysphagia/ malignant tracheal obstruction managed by Cyril Loosen DO at [facility] has refused hospice and wishes to continue with treatment. Unable to independently complete all ADL's without some assistance due to be tired/ fatigued most of the time. Patient's son and daughter assist her as needed.  Pt reports "slight"  difficulty swallowing and is drinking 2 Ensure daily and trying to eat high calorie small frequent meals.  Pt is now taking medication for cancer po and tolerating well so far.  Patient reports she has lost weight and is trying to protect her skin and changing positions q 2 hours- in recliner.  Patient does not feel she needs services of LCSW at this time (financial concerns and overwhelmed with cancer diagnosis) and states " it's too much and I'm tired of being bothered"  Patient reports she did not take paxlovid, decided against this. Does not adhere to  provider recommendations re: does not have 2 medications and did not call health care provider for assistance Clinical Goal(s):  Collaboration with Lindell Spar, MD regarding development and update of comprehensive plan of care as evidenced by provider attestation and co-signature Inter-disciplinary care team collaboration (see longitudinal plan of care) patient will work with care management team to address care coordination and chronic disease management needs related to Disease Management Educational Needs Care Coordination Medication Management and Education   Interventions:  Evaluation of current treatment plan related to cancer, self-management and patient's adherence to plan as established by provider. Collaboration with Lindell Spar, MD regarding development and update of comprehensive plan of care as evidenced by provider attestation       and co-signature Inter-disciplinary care team collaboration (see longitudinal plan of care) Discussed plans with patient for ongoing care management follow up and provided patient with direct contact information for care management team Reviwed all medications with patient and emphasized the importance of taking as prescribed Reinforced energy conservation Reinforced importance of staying in touch with your doctor Reviewed all upcoming scheduled appointments-  11/4  Dr. [name],  11/7  Dr. Dorris Fetch.  12/15 Dr. Posey Pronto Reviewed precuations to prevent infection- handwashing, wearing a  mask as needed, eating as healthy as possible, adequate fluids and rest. Reviewed consuming adequate protein in diet, continue to drink Ensure Change positions q 2 hours and importance of preserving skin integrity Self Care Activities:  Attends all scheduled provider appointments Calls provider office for new concerns or questions Patient Goals: - eat healthy - talk to your doctor if you have nausea - get at least 8 hours of sleep at night - try to get outdoors every  day (weather permitting) - keep the bedroom cool and dark - limit daytime naps - use meditation or relaxation techniques  - keep up to date on immunizations - practice good handwashing and wear a mask as needed - continue to drink ensure and consume adequate protein - change positions q 2 hours Follow Up Plan: Telephone follow up appointment with care management team member scheduled for:  04/07/2021     Plan:Telephone follow up appointment with care management team member scheduled for: 04/07/2021  Jacqlyn Larsen [facility], BSN RN Case Manager [facility] [phone]

## 2021-03-08 NOTE — Chronic Care Management (AMB) (Signed)
  Chronic Care Management   Note  03/08/2021 Name: Marilyn Solomon MRN: [id number] DOB: 1945/06/19  Marilyn Solomon is a 75 y.o. year old female who is a primary care patient of Posey Pronto, Colin Broach, MD. Marilyn Solomon is currently enrolled in care management services. An additional referral for Pharmacy was placed.   Follow up plan: Telephone appointment with care management team member scheduled for:03/24/21  [facility]  Direct Dial: [phone]

## 2021-03-09 DIAGNOSIS — J398 Other specified diseases of upper respiratory tract: Secondary | ICD-10-CM | POA: Diagnosis not present

## 2021-03-09 DIAGNOSIS — K1231 Oral mucositis (ulcerative) due to antineoplastic therapy: Secondary | ICD-10-CM | POA: Diagnosis not present

## 2021-03-09 DIAGNOSIS — R1319 Other dysphagia: Secondary | ICD-10-CM | POA: Diagnosis not present

## 2021-03-09 DIAGNOSIS — C781 Secondary malignant neoplasm of mediastinum: Secondary | ICD-10-CM | POA: Diagnosis not present

## 2021-03-09 DIAGNOSIS — C73 Malignant neoplasm of thyroid gland: Secondary | ICD-10-CM | POA: Diagnosis not present

## 2021-03-09 DIAGNOSIS — E890 Postprocedural hypothyroidism: Secondary | ICD-10-CM | POA: Diagnosis not present

## 2021-03-09 DIAGNOSIS — K521 Toxic gastroenteritis and colitis: Secondary | ICD-10-CM | POA: Diagnosis not present

## 2021-03-10 ENCOUNTER — Telehealth: Payer: Self-pay

## 2021-03-10 NOTE — Telephone Encounter (Signed)
   Telephone encounter was:  Successful.  03/10/2021 Name: BATOOL MAJID MRN: [id number] DOB: Jun 06, 1945  Shirley Friar Fein is a 75 y.o. year old female who is a primary care patient of Lindell Spar, MD . The community resource team was consulted for assistance with  hearing aids.  Care guide performed the following interventions: Spoke with patient's daughter Chaniyah Jahr confirmed email address [email or web address] Cal information for [facility] and hearing centers in [place] for free hearing test. Letter saved in [place].  Follow Up Plan:  Care guide will follow up with patient by phone over the next 7 days.  Aminah Zabawa, AAS Paralegal, [facility]  [address], [place] [address] ??[email or web address]  ?? [phone]   [email or web address]

## 2021-03-11 ENCOUNTER — Encounter (HOSPITAL_COMMUNITY): Payer: Self-pay

## 2021-03-17 ENCOUNTER — Telehealth: Payer: Self-pay

## 2021-03-17 ENCOUNTER — Other Ambulatory Visit: Payer: Self-pay | Admitting: Internal Medicine

## 2021-03-17 DIAGNOSIS — G8929 Other chronic pain: Secondary | ICD-10-CM

## 2021-03-17 NOTE — Telephone Encounter (Signed)
   Telephone encounter was:  Unsuccessful.  03/17/2021 Name: Marilyn Solomon MRN: [id number] DOB: 1945/11/20  Unsuccessful outbound call made today to assist with:   hearing aids.  Unable to leave message for patient's daughter Marilyn Solomon regarding emailed resource for hearing aids on 03/10/21.  Voicemail does not pick-up.  Outreach Attempt:  2nd Attempt   Lodema Parma, AAS Paralegal, [facility]  [address], [place] [address] ??[email or web address]  ?? [phone]   [email or web address]

## 2021-03-21 ENCOUNTER — Telehealth: Payer: Self-pay

## 2021-03-21 NOTE — Telephone Encounter (Signed)
   Telephone encounter was:  Successful.  03/21/2021 Name: Marilyn Solomon MRN: [id number] DOB: 1945-09-29  Marilyn Solomon is a 75 y.o. year old female who is a primary care patient of Lindell Spar, MD . The community resource team was consulted for assistance with  hearing aids.  Care guide performed the following interventions: Spoke with patient's daughter Marilyn Solomon she has not checked her email for information emailed for [facility]. She will send an email confirmation once she checks her email.  Follow Up Plan:  No further follow up planned at this time. The patient has been provided with needed resources.  Siah Steely, AAS Paralegal, [place] [facility]  [address], [place] [address] ??[email or web address]  ?? [phone]   [email or web address]

## 2021-03-24 ENCOUNTER — Ambulatory Visit: Payer: Medicare Other | Admitting: Pharmacist

## 2021-03-24 DIAGNOSIS — C73 Malignant neoplasm of thyroid gland: Secondary | ICD-10-CM

## 2021-03-24 DIAGNOSIS — I10 Essential (primary) hypertension: Secondary | ICD-10-CM

## 2021-03-24 DIAGNOSIS — J441 Chronic obstructive pulmonary disease with (acute) exacerbation: Secondary | ICD-10-CM

## 2021-03-24 NOTE — Chronic Care Management (AMB) (Signed)
Chronic Care Management Pharmacy Note  03/24/2021 Name:  Marilyn Solomon MRN:  [id number] DOB:  [birth year]  Summary:  Hypertension - New goal.: Amlodipine currently on hold per oncology team due to hypotension Current home blood pressure: 017P-102 systolic now off amlodipine  Chronic Obstructive Pulmonary Disease - New goal.: Uncontrolled; patient only has albuterol since she was unable to afford Stiolto. Called patient's pharmacy and [place] co-pay is $142 this time because she has a $95 deductible that has not met but the price will go down to $47 next fill once deductible is met. Patient unable to pay this.  5 exacerbations requiring treatment in the last 6 months  Current oxygen requirements: 2 L Continue short acting bronchodilators as needed for shortness of breath Follow-up with pulmonology Will complete patient assistance application with [employer] and have patient's daughter (AKA patient representative come by to sign form).   Cancer: Patient reports cost concerns with chemo but recently found out she was sponsored so this has been resolved and she has access to the chemo she needs.  Subjective: Marilyn Solomon is an 75 y.o. year old female who is a primary patient of Lindell Spar, MD.  The CCM team was consulted for assistance with disease management and care coordination needs.    Engaged with patient and daughter Marilyn Solomon) by telephone for initial visit in response to provider referral for pharmacy case management and/or care coordination services.   Consent to Services:  The patient was given the following information about Chronic Care Management services today, agreed to services, and gave verbal consent: 1. CCM service includes personalized support from designated clinical staff supervised by the primary care provider, including individualized plan of care and coordination with other care providers 2. 24/7 contact phone numbers for assistance for urgent and routine  care needs. 3. Service will only be billed when office clinical staff spend 20 minutes or more in a month to coordinate care. 4. Only one practitioner may furnish and bill the service in a calendar month. 5.The patient may stop CCM services at any time (effective at the end of the month) by phone call to the office staff. 6. The patient will be responsible for cost sharing (co-pay) of up to 20% of the service fee (after annual deductible is met). Patient agreed to services and consent obtained.  Patient Care Team: Lindell Spar, MD as PCP - General (Internal Medicine) Gala Romney Cristopher Estimable, MD as Consulting Physician (Gastroenterology) Sinda Du, MD as Consulting Physician (Pulmonary Disease) Kassie Mends, RN as [place] Management Beryle Lathe, [facility] (Pharmacist)  Objective:  Lab Results  Component Value Date   CREATININE 0.69 02/22/2021   CREATININE 0.70 02/21/2021   CREATININE 0.54 02/18/2021    Lab Results  Component Value Date   HGBA1C 6.0 (H) 06/03/2020   Last diabetic Eye exam: No results found for: HMDIABEYEEXA  Last diabetic Foot exam: No results found for: HMDIABFOOTEX      Component Value Date/Time   CHOL 190 06/03/2020 1019   TRIG 464 (H) 09/14/2020 0344   HDL 41 06/03/2020 1019   CHOLHDL 4.6 (H) 06/03/2020 1019   LDLCALC 121 (H) 06/03/2020 1019    Hepatic Function Latest Ref Rng & Units 02/22/2021 02/21/2021 02/18/2021  Total Protein 6.5 - 8.1 g/dL 6.0(L) 6.2(L) 6.7  Albumin 3.5 - 5.0 g/dL 3.2(L) 3.5 3.7  AST 15 - 41 U/L $Remo'19 16 16  'vqins$ ALT 0 - 44 U/L $Remo'19 18 20  'UdOwx$ Alk Phosphatase  38 - 126 U/L 65 71 66  Total Bilirubin 0.3 - 1.2 mg/dL 0.5 0.4 0.4  Bilirubin, Direct 0.0 - 0.2 mg/dL - - -    Lab Results  Component Value Date/Time   TSH 1.090 11/18/2020 08:07 AM   TSH 6.150 (H) 06/03/2020 10:19 AM   FREET4 1.87 (H) 11/18/2020 08:07 AM   FREET4 1.40 06/03/2020 10:19 AM    CBC Latest Ref Rng & Units 02/22/2021 02/21/2021 02/18/2021  WBC  4.0 - 10.5 K/uL 5.0 7.9 6.7  Hemoglobin 12.0 - 15.0 g/dL 10.0(L) 11.4(L) 11.7(L)  Hematocrit 36.0 - 46.0 % 34.2(L) 38.5 39.5  Platelets 150 - 400 K/uL 196 236 216    Lab Results  Component Value Date/Time   VD25OH 54.3 06/03/2020 10:19 AM   VD25OH 42.70 10/08/2019 03:27 AM    Clinical ASCVD: No  The 10-year ASCVD risk score (Arnett DK, et al., 2019) is: 30.6%   Values used to calculate the score:     Age: 75 years     Sex: Female     Is Non-Hispanic African American: No     Diabetic: Yes     Tobacco smoker: No     Systolic Blood Pressure: 277 mmHg     Is BP treated: Yes     HDL Cholesterol: 41 mg/dL     Total Cholesterol: 190 mg/dL    Social History   Tobacco Use  Smoking Status Former   Packs/day: 25.00   Years: 1.00   Pack years: 25.00   Types: Cigarettes   Quit date: 08/27/1998   Years since quitting: 22.5  Smokeless Tobacco Never  Tobacco Comments   quit in 2000 Verified 01/28/21 MR    BP Readings from Last 3 Encounters:  02/22/21 (!) 97/56  02/18/21 (!) 94/53  02/12/21 99/87   Pulse Readings from Last 3 Encounters:  02/22/21 77  02/18/21 78  02/12/21 96   Wt Readings from Last 3 Encounters:  02/21/21 93 lb (42.2 kg)  02/18/21 99 lb 3.3 oz (45 kg)  02/12/21 99 lb 3.3 oz (45 kg)    Assessment: Review of patient past medical history, allergies, medications, health status, including review of consultants reports, laboratory and other test data, was performed as part of comprehensive evaluation and provision of chronic care management services.   SDOH:  (Social Determinants of Health) assessments and interventions performed:    CCM Care Plan  Allergies  Allergen Reactions   Penicillins Itching     Tolerated Ancef Intraop 09/13/20   Vicodin [Hydrocodone-Acetaminophen] Nausea And Vomiting    Medications Reviewed Today     Reviewed by Beryle Lathe, [facility] (Pharmacist) on 03/24/21 at 1124  Med List Status: <None>   Medication Order Taking? Sig  Documenting Provider Last Dose Status Informant  albuterol (PROVENTIL) (2.5 MG/3ML) 0.083% nebulizer solution [id number] Yes Take 3 mLs (2.5 mg total) by nebulization every 6 (six) hours as needed for wheezing or shortness of breath. Roxan Hockey, MD Taking Active   albuterol (VENTOLIN HFA) 108 (90 Base) MCG/ACT inhaler [id number] Yes Inhale 1-2 puffs into the lungs every 6 (six) hours as needed for wheezing or shortness of breath. Roxan Hockey, MD Taking Active   ALPRAZolam Duanne Moron) 1 MG tablet [id number] Yes Take 1 tablet (1 mg total) by mouth every 8 (eight) hours as needed for anxiety. Lindell Spar, MD Taking Active Self  amLODipine (NORVASC) 5 MG tablet [id number] No Take 1 tablet (5 mg total) by mouth daily.  Patient not taking: Reported on  03/24/2021   Roxan Hockey, MD Not Taking Active   guaiFENesin (MUCINEX) 600 MG 12 hr tablet [id number] No Take 1 tablet (600 mg total) by mouth 2 (two) times daily.  Patient not taking: No sig reported   Roxan Hockey, MD Not Taking Active   levothyroxine (SYNTHROID) 75 MCG tablet [id number] Yes TAKE ONE TABLET BY MOUTH ONCE DAILY. Lindell Spar, MD Taking Active   ondansetron [facility]) 8 MG tablet [id number] Yes Take 8 mg by mouth every 8 (eight) hours as needed for nausea/vomiting. [provider] Taking Active Self  oxyCODONE (ROXICODONE) 15 MG immediate release tablet [id number] Yes Take 1 tablet (15 mg total) by mouth every 8 (eight) hours as needed (Severe pain). Lindell Spar, MD Taking Active   pantoprazole (PROTONIX) 40 MG tablet [id number] Yes Take 1 tablet (40 mg total) by mouth daily. Roxan Hockey, MD Taking Active   promethazine-dextromethorphan (PROMETHAZINE-DM) 6.25-15 MG/5ML syrup [id number] Yes Take 5 mLs by mouth 4 (four) times daily as needed for cough. Lindell Spar, MD Taking Active Self  Tiotropium Bromide-Olodaterol (STIOLTO RESPIMAT) 2.5-2.5 MCG/ACT AERS [id number] No Inhale 2 puffs into the lungs daily.   Patient not taking: No sig reported   Emokpae, Courage, MD Not Taking Active   Vitamin D, Ergocalciferol, (DRISDOL) 1.25 MG (50000 UNIT) CAPS capsule [id number] Yes Take 50,000 Units by mouth every Sunday. [provider] Taking Active Self            Patient Active Problem List   Diagnosis Date Noted   COVID-19 virus infection 02/21/2021   Complete paralysis of right vocal cord 08/27/2020   Malignant neoplasm of thyroid gland ([place]) 07/08/2020   Other chronic pain 06/17/2020   Multinodular goiter 06/10/2020   Neck mass 06/03/2020   Hearing abnormally acute, right 06/03/2020   Chronic respiratory failure with hypoxia and hypercapnia ([place]) 05/11/2020   Irritable bowel syndrome with atypical cp  05/11/2020   Acute kidney injury superimposed on CKD ([place]) 01/23/2020   Hypertension 06/02/2018   Positive colorectal cancer screening using Cologuard test 08/21/2017   Normocytic anemia    Anxiety 06/30/2017   Hypothyroidism 06/30/2017   GERD (gastroesophageal reflux disease) 06/30/2017   COPD with acute exacerbation ([place]) 06/30/2017   Elevated liver enzymes 06/30/2017   Severe Iron deficiency anemia 06/30/2017   Second degree hemorrhoids     Immunization History  Administered Date(s) Administered   Fluad Quad(high Dose 65+) 02/22/2021   Influenza Split 03/08/2016   Influenza-Unspecified 01/28/2020   Moderna Sars-Covid-2 Vaccination 10/30/2019, 11/27/2019   Pneumococcal Polysaccharide-23 06/29/2017    Conditions to be addressed/monitored: HTN, COPD, and cancer  Care Plan : Medication Management  Updates made by Beryle Lathe, [place] since 03/24/2021 12:00 AM     Problem: COPD, HTN, Cancer   Priority: High  Onset Date: 03/24/2021     Long-Range Goal: Disease Progression Prevention   Start Date: 03/24/2021  Expected End Date: 06/22/2021  This Visit's Progress: On track  Priority: High  Note:   Current Barriers:  Unable to independently afford treatment  regimen Unable to achieve control of chronic obstructive pulmonary disease  Pharmacist Clinical Goal(s):  Patient will verbalize ability to afford treatment regimen achieve control of chronic obstructive pulmonary disease as evidenced by improved shortness of breath through collaboration with PharmD and provider.   Interventions: 1:1 collaboration with Lindell Spar, MD regarding development and update of comprehensive plan of care as evidenced by provider attestation and co-signature Inter-disciplinary care team collaboration (see longitudinal plan of  care) Comprehensive medication review performed; medication list updated in electronic medical record  Hypertension - New goal.: Blood pressure under good control. Blood pressure is at goal of <130/80 mmHg per 2017 AHA/ACC guidelines. Current medications: amlodipine 5 mg by mouth once daily currently on hold per oncology team due to hypotension Intolerances: none Taking medications as directed: yes Side effects thought to be attributed to current medication regimen: no Current home blood pressure: 979G-921 systolic now off amlodipine Blood pressure medications not indicated at present Recommend home blood pressure monitoring to discuss at next visit  Chronic Obstructive Pulmonary Disease - New goal.: Uncontrolled; patient only has albuterol since she was unable to afford Stiolto. Called patient's pharmacy and [place] co-pay is $142 this time because she has a $95 deductible that has not met but the price will go down to $47 next fill once deductible is met. Patient unable to pay this.  Current treatment: albuterol metered dose (ProAir, Ventolin, Proventil) 1 puff by mouth as needed for shortness of breath and albuterol neb as needed for shortness of breath Most recent Pulmonary Function Testing: unknown 5 exacerbations requiring treatment in the last 6 months  Current oxygen requirements: 2 L Continue short acting bronchodilators as  needed for shortness of breath Follow-up with pulmonology Will complete patient assistance application with [employer] and have patient's daughter (AKA patient representative come by to sign form).   Cancer: Patient reports cost concerns with chemo but recently found out she was sponsored so this has been resolved and she has access to the chemo she needs.  Patient Goals/Self-Care Activities Patient will:  Take medications as prescribed Check blood pressure at least once daily, document, and provide at future appointments Collaborate with provider on medication access solutions  Follow Up Plan: Telephone follow up appointment with care management team member scheduled for: 04/14/21      Medication Assistance:  [place] application for [employer] pending  Patient's preferred pharmacy is:  [facility], [address] [place] [address] Phone: [phone] Fax: [phone]  Follow Up:  Patient agrees to Care Plan and Follow-up.  Plan: Telephone follow up appointment with care management team member scheduled for:  04/14/21  Kennon Holter, PharmD Clinical Pharmacist [facility] [phone]

## 2021-03-24 NOTE — Patient Instructions (Signed)
Marilyn Solomon,  It was great to talk to you today!  Please call me with any questions or concerns.   Visit Information   PATIENT GOALS:   Goals Addressed             This Visit's Progress    Medication Management       Patient Goals/Self-Care Activities Patient will:  Take medications as prescribed Check blood pressure at least once daily, document, and provide at future appointments Collaborate with provider on medication access solutions        Consent to CCM Services: [name] was given information about Chronic Care Management services including:  CCM service includes personalized support from designated clinical staff supervised by her physician, including individualized plan of care and coordination with other care providers 24/7 contact phone numbers for assistance for urgent and routine care needs. Service will only be billed when office clinical staff spend 20 minutes or more in a month to coordinate care. Only one practitioner may furnish and bill the service in a calendar month. The patient may stop CCM services at any time (effective at the end of the month) by phone call to the office staff. The patient will be responsible for cost sharing (co-pay) of up to 20% of the service fee (after annual deductible is met).  Patient agreed to services and verbal consent obtained.   The patient verbalized understanding of instructions, educational materials, and care plan provided today and declined offer to receive copy of patient instructions, educational materials, and care plan.   Telephone follow up appointment with care management team member scheduled for:04/14/21  Kennon Holter, PharmD Clinical Pharmacist [facility] [phone]   CLINICAL CARE PLAN: Patient Care Plan: COPD (Adult)     Problem Identified: Symptom Exacerbation (COPD)   Priority: High     Long-Range Goal: Symptom Exacerbation Prevented or Minimized   Start  Date: 02/24/2021  Expected End Date: 08/24/2021  This Visit's Progress: On track  Recent Progress: On track  Priority: High  Note:   Current Barriers:  Knowledge deficits related to basic understanding of COPD disease process- needs reinforcement of COPD action plan with emphasis on yellow zone, pt reports she lives with her son and granddaughter, her car is broken down, her son provides transportation, pt states she has pulse oximeter and checks frequently with readings 98-100%.  Uses oxygen continuously at 2 liters via [place].  Pt is drinking 2 ensure daily and trying to eat small meals due to malignant esophageal dysphagia and tracheal obstruction.  Pt reports she does not have a feeding tube although this had been discussed, patient refused hospice at present.  Pt reports she cannot afford hearing aides and would like any resources for this.  Pt reports she did not attend her scheduled primary care provider appointment on 10/4. Knowledge deficits related to basic COPD self care/management Cannot afford prescribed medications- unable to afford stiolto respimat inhaler Does not adhere to prescribed medication regimen- does not have stiolto respimat inhaler and is involved with pharmacist Unable to perform ADLs independently- family assists as needed, patient reports she is mostly independent but is "tired due to COPD and cancer" Does not contact provider office for questions/concerns Case Manager Clinical Goal(s): patient will report using inhalers as prescribed including rinsing mouth after use patient will report utilizing pursed lip breathing for shortness of breath patient will verbalize understanding of COPD action plan and when to seek appropriate levels of medical care patient will verbalize basic understanding of  COPD disease process and self care activities  Interventions:  Collaboration with Lindell Spar, MD regarding development and update of comprehensive plan of care as evidenced by  provider attestation and co-signature Inter-disciplinary care team collaboration (see longitudinal plan of care) Reinforced with patient with basic written and verbal COPD education on self care/management/and exacerbation prevention  Provided patient with COPD action plan and reinforced importance of daily self assessment Reinforced instruction about proper use of medications used for management of COPD including inhalers Reinforced with patient to self assesses COPD action plan zone and make appointment with provider if in the yellow zone for 48 hours without improvement. Reinforced education about and advised patient to utilize infection prevention strategies to reduce risk of respiratory infection Reviewed medications with patient  Encouraged patient to reschedule her primary care provider appointment Self-Care Activities:  Attends all scheduled provider appointments Calls pharmacy for medication refills Calls provider office for new concerns or questions Patient Goals: - follow COPD action plan with emphasis on yellow zone - eliminate symptom triggers at home  (dust, strong scents, pet dander, etc) - follow rescue plan if symptoms flare-up, call your doctor early on for change in health status, symptoms, call 911 if needed for emergency such as unrelieved shortness of breath - keep all follow-up appointments and reschedule your visit with Dr. Posey Pronto - use an extra pillow to sleep  - expect a call from pharmacist about assistance of Stiolto inhaler - expect a call from care guide for resources for hearing aides - take all medications and use inhalers as prescribed - develop a new routine to improve sleep - get at least 7 to 8 hours of sleep at night - get outdoors every day (weather permitting) - keep room cool and dark - limit daytime naps - practice relaxation or meditation daily - use a fan or white noise in bedroom Follow Up Plan: Telephone follow up appointment with care  management team member scheduled for:   04/07/2021    Patient Care Plan: Cancer Treatment Phase (Adult)     Problem Identified: Fatigue/ malnutrition related to cancer   Priority: High     Long-Range Goal: Fatigue / malnutrition Managed   Start Date: 02/24/2021  Expected End Date: 08/24/2021  This Visit's Progress: On track  Recent Progress: On track  Priority: High  Note:   Current Barriers:  Ineffective Self Health Maintenance in a patient with thyroid cancer/ malignant esophageal dysphagia/ malignant tracheal obstruction managed by Cyril Loosen DO at [place] has refused hospice and wishes to continue with treatment. Unable to independently complete all ADL's without some assistance due to be tired/ fatigued most of the time. Patient's son and daughter assist her as needed.  Pt reports "slight"  difficulty swallowing and is drinking 2 Ensure daily and trying to eat high calorie small frequent meals.  Pt is now taking medication for cancer po and tolerating well so far.  Patient reports she has lost weight and is trying to protect her skin and changing positions q 2 hours- in recliner.  Patient does not feel she needs services of LCSW at this time (financial concerns and overwhelmed with cancer diagnosis) and states " it's too much and I'm tired of being bothered"  Patient reports she did not take paxlovid, decided against this. Does not adhere to provider recommendations re: does not have 2 medications and did not call health care provider for assistance Clinical Goal(s):  Collaboration with Lindell Spar, MD regarding development and update  of comprehensive plan of care as evidenced by provider attestation and co-signature Inter-disciplinary care team collaboration (see longitudinal plan of care) patient will work with care management team to address care coordination and chronic disease management needs related to Disease Management Educational Needs Care Coordination Medication  Management and Education   Interventions:  Evaluation of current treatment plan related to cancer, self-management and patient's adherence to plan as established by provider. Collaboration with Lindell Spar, MD regarding development and update of comprehensive plan of care as evidenced by provider attestation       and co-signature Inter-disciplinary care team collaboration (see longitudinal plan of care) Discussed plans with patient for ongoing care management follow up and provided patient with direct contact information for care management team Reviwed all medications with patient and emphasized the importance of taking as prescribed Reinforced energy conservation Reinforced importance of staying in touch with your doctor Reviewed all upcoming scheduled appointments-  11/4  Dr. [name],  11/7  Dr. Dorris Fetch.  12/15 Dr. Posey Pronto Reviewed precuations to prevent infection- handwashing, wearing a mask as needed, eating as healthy as possible, adequate fluids and rest. Reviewed consuming adequate protein in diet, continue to drink Ensure Change positions q 2 hours and importance of preserving skin integrity Self Care Activities:  Attends all scheduled provider appointments Calls provider office for new concerns or questions Patient Goals: - eat healthy - talk to your doctor if you have nausea - get at least 8 hours of sleep at night - try to get outdoors every day (weather permitting) - keep the bedroom cool and dark - limit daytime naps - use meditation or relaxation techniques  - keep up to date on immunizations - practice good handwashing and wear a mask as needed - continue to drink ensure and consume adequate protein - change positions q 2 hours Follow Up Plan: Telephone follow up appointment with care management team member scheduled for:  04/07/2021    Patient Care Plan: Medication Management     Problem Identified: COPD, HTN, Cancer   Priority: High  Onset Date: 03/24/2021      Long-Range Goal: Disease Progression Prevention   Start Date: 03/24/2021  Expected End Date: 06/22/2021  This Visit's Progress: On track  Priority: High  Note:   Current Barriers:  Unable to independently afford treatment regimen Unable to achieve control of chronic obstructive pulmonary disease  Pharmacist Clinical Goal(s):  Patient will verbalize ability to afford treatment regimen achieve control of chronic obstructive pulmonary disease as evidenced by improved shortness of breath through collaboration with PharmD and provider.   Interventions: 1:1 collaboration with Lindell Spar, MD regarding development and update of comprehensive plan of care as evidenced by provider attestation and co-signature Inter-disciplinary care team collaboration (see longitudinal plan of care) Comprehensive medication review performed; medication list updated in electronic medical record  Hypertension - New goal.: Blood pressure under good control. Blood pressure is at goal of <130/80 mmHg per 2017 AHA/ACC guidelines. Current medications: amlodipine 5 mg by mouth once daily currently on hold per oncology team due to hypotension Intolerances: none Taking medications as directed: yes Side effects thought to be attributed to current medication regimen: no Current home blood pressure: [id number] systolic now off amlodipine Blood pressure medications not indicated at present Recommend home blood pressure monitoring to discuss at next visit  Chronic Obstructive Pulmonary Disease - New goal.: Uncontrolled; patient only has albuterol since she was unable to afford Stiolto. Called patient's pharmacy and [place] co-pay is $142 this time  because she has a $95 deductible that has not met but the price will go down to $47 next fill once deductible is met. Patient unable to pay this.  Current treatment: albuterol metered dose (ProAir, Ventolin, Proventil) 1 puff by mouth as needed for shortness of breath and  albuterol neb as needed for shortness of breath Most recent Pulmonary Function Testing: unknown 5 exacerbations requiring treatment in the last 6 months  Current oxygen requirements: 2 L Continue short acting bronchodilators as needed for shortness of breath Follow-up with pulmonology Will complete patient assistance application with [employer] and have patient's daughter (AKA patient representative come by to sign form).   Cancer: Patient reports cost concerns with chemo but recently found out she was sponsored so this has been resolved and she has access to the chemo she needs.  Patient Goals/Self-Care Activities Patient will:  Take medications as prescribed Check blood pressure at least once daily, document, and provide at future appointments Collaborate with provider on medication access solutions  Follow Up Plan: Telephone follow up appointment with care management team member scheduled for: 04/14/21

## 2021-03-26 ENCOUNTER — Other Ambulatory Visit: Payer: Self-pay | Admitting: Internal Medicine

## 2021-03-26 DIAGNOSIS — F419 Anxiety disorder, unspecified: Secondary | ICD-10-CM

## 2021-03-27 ENCOUNTER — Emergency Department (HOSPITAL_COMMUNITY): Payer: Medicare Other

## 2021-03-27 ENCOUNTER — Other Ambulatory Visit: Payer: Self-pay

## 2021-03-27 ENCOUNTER — Encounter (HOSPITAL_COMMUNITY): Payer: Self-pay | Admitting: Emergency Medicine

## 2021-03-27 ENCOUNTER — Inpatient Hospital Stay (HOSPITAL_COMMUNITY)
Admission: EM | Admit: 2021-03-27 | Discharge: 2021-04-28 | DRG: 871 | Disposition: E | Payer: Medicare Other | Attending: Internal Medicine | Admitting: Internal Medicine

## 2021-03-27 DIAGNOSIS — K219 Gastro-esophageal reflux disease without esophagitis: Secondary | ICD-10-CM | POA: Diagnosis present

## 2021-03-27 DIAGNOSIS — K8020 Calculus of gallbladder without cholecystitis without obstruction: Secondary | ICD-10-CM

## 2021-03-27 DIAGNOSIS — I10 Essential (primary) hypertension: Secondary | ICD-10-CM | POA: Diagnosis not present

## 2021-03-27 DIAGNOSIS — E43 Unspecified severe protein-calorie malnutrition: Secondary | ICD-10-CM | POA: Diagnosis not present

## 2021-03-27 DIAGNOSIS — J690 Pneumonitis due to inhalation of food and vomit: Secondary | ICD-10-CM | POA: Diagnosis not present

## 2021-03-27 DIAGNOSIS — N179 Acute kidney failure, unspecified: Secondary | ICD-10-CM | POA: Diagnosis present

## 2021-03-27 DIAGNOSIS — J189 Pneumonia, unspecified organism: Secondary | ICD-10-CM | POA: Diagnosis present

## 2021-03-27 DIAGNOSIS — K5730 Diverticulosis of large intestine without perforation or abscess without bleeding: Secondary | ICD-10-CM | POA: Diagnosis not present

## 2021-03-27 DIAGNOSIS — K819 Cholecystitis, unspecified: Secondary | ICD-10-CM | POA: Diagnosis not present

## 2021-03-27 DIAGNOSIS — R64 Cachexia: Secondary | ICD-10-CM | POA: Diagnosis present

## 2021-03-27 DIAGNOSIS — R739 Hyperglycemia, unspecified: Secondary | ICD-10-CM | POA: Diagnosis not present

## 2021-03-27 DIAGNOSIS — E890 Postprocedural hypothyroidism: Secondary | ICD-10-CM | POA: Diagnosis not present

## 2021-03-27 DIAGNOSIS — F419 Anxiety disorder, unspecified: Secondary | ICD-10-CM | POA: Diagnosis present

## 2021-03-27 DIAGNOSIS — J9622 Acute and chronic respiratory failure with hypercapnia: Secondary | ICD-10-CM | POA: Diagnosis present

## 2021-03-27 DIAGNOSIS — J9621 Acute and chronic respiratory failure with hypoxia: Secondary | ICD-10-CM | POA: Diagnosis not present

## 2021-03-27 DIAGNOSIS — A419 Sepsis, unspecified organism: Principal | ICD-10-CM | POA: Diagnosis present

## 2021-03-27 DIAGNOSIS — Z96642 Presence of left artificial hip joint: Secondary | ICD-10-CM | POA: Diagnosis present

## 2021-03-27 DIAGNOSIS — Z8616 Personal history of COVID-19: Secondary | ICD-10-CM | POA: Diagnosis not present

## 2021-03-27 DIAGNOSIS — K8689 Other specified diseases of pancreas: Secondary | ICD-10-CM | POA: Diagnosis not present

## 2021-03-27 DIAGNOSIS — Z681 Body mass index (BMI) 19 or less, adult: Secondary | ICD-10-CM

## 2021-03-27 DIAGNOSIS — D72829 Elevated white blood cell count, unspecified: Secondary | ICD-10-CM | POA: Diagnosis not present

## 2021-03-27 DIAGNOSIS — E8729 Other acidosis: Secondary | ICD-10-CM | POA: Diagnosis not present

## 2021-03-27 DIAGNOSIS — J90 Pleural effusion, not elsewhere classified: Secondary | ICD-10-CM | POA: Diagnosis not present

## 2021-03-27 DIAGNOSIS — Z4682 Encounter for fitting and adjustment of non-vascular catheter: Secondary | ICD-10-CM | POA: Diagnosis not present

## 2021-03-27 DIAGNOSIS — R109 Unspecified abdominal pain: Secondary | ICD-10-CM

## 2021-03-27 DIAGNOSIS — Z87891 Personal history of nicotine dependence: Secondary | ICD-10-CM | POA: Diagnosis not present

## 2021-03-27 DIAGNOSIS — C73 Malignant neoplasm of thyroid gland: Secondary | ICD-10-CM | POA: Diagnosis present

## 2021-03-27 DIAGNOSIS — T68XXXA Hypothermia, initial encounter: Secondary | ICD-10-CM | POA: Diagnosis not present

## 2021-03-27 DIAGNOSIS — L89152 Pressure ulcer of sacral region, stage 2: Secondary | ICD-10-CM | POA: Diagnosis present

## 2021-03-27 DIAGNOSIS — R110 Nausea: Secondary | ICD-10-CM | POA: Diagnosis not present

## 2021-03-27 DIAGNOSIS — J9690 Respiratory failure, unspecified, unspecified whether with hypoxia or hypercapnia: Secondary | ICD-10-CM | POA: Diagnosis not present

## 2021-03-27 DIAGNOSIS — Z79890 Hormone replacement therapy (postmenopausal): Secondary | ICD-10-CM

## 2021-03-27 DIAGNOSIS — R0902 Hypoxemia: Secondary | ICD-10-CM | POA: Diagnosis not present

## 2021-03-27 DIAGNOSIS — N184 Chronic kidney disease, stage 4 (severe): Secondary | ICD-10-CM | POA: Diagnosis not present

## 2021-03-27 DIAGNOSIS — E860 Dehydration: Secondary | ICD-10-CM | POA: Diagnosis not present

## 2021-03-27 DIAGNOSIS — Z515 Encounter for palliative care: Secondary | ICD-10-CM

## 2021-03-27 DIAGNOSIS — R1011 Right upper quadrant pain: Secondary | ICD-10-CM | POA: Diagnosis not present

## 2021-03-27 DIAGNOSIS — Z9221 Personal history of antineoplastic chemotherapy: Secondary | ICD-10-CM

## 2021-03-27 DIAGNOSIS — R6520 Severe sepsis without septic shock: Secondary | ICD-10-CM | POA: Diagnosis present

## 2021-03-27 DIAGNOSIS — J449 Chronic obstructive pulmonary disease, unspecified: Secondary | ICD-10-CM | POA: Diagnosis not present

## 2021-03-27 DIAGNOSIS — C781 Secondary malignant neoplasm of mediastinum: Secondary | ICD-10-CM | POA: Diagnosis not present

## 2021-03-27 DIAGNOSIS — K828 Other specified diseases of gallbladder: Secondary | ICD-10-CM | POA: Diagnosis not present

## 2021-03-27 DIAGNOSIS — Z66 Do not resuscitate: Secondary | ICD-10-CM | POA: Diagnosis not present

## 2021-03-27 DIAGNOSIS — R221 Localized swelling, mass and lump, neck: Secondary | ICD-10-CM | POA: Diagnosis not present

## 2021-03-27 DIAGNOSIS — J9811 Atelectasis: Secondary | ICD-10-CM | POA: Diagnosis not present

## 2021-03-27 DIAGNOSIS — L8990 Pressure ulcer of unspecified site, unspecified stage: Secondary | ICD-10-CM | POA: Insufficient documentation

## 2021-03-27 DIAGNOSIS — Z7189 Other specified counseling: Secondary | ICD-10-CM | POA: Diagnosis not present

## 2021-03-27 DIAGNOSIS — Z90710 Acquired absence of both cervix and uterus: Secondary | ICD-10-CM

## 2021-03-27 DIAGNOSIS — I9581 Postprocedural hypotension: Secondary | ICD-10-CM | POA: Diagnosis not present

## 2021-03-27 DIAGNOSIS — R0600 Dyspnea, unspecified: Secondary | ICD-10-CM

## 2021-03-27 DIAGNOSIS — Z800 Family history of malignant neoplasm of digestive organs: Secondary | ICD-10-CM

## 2021-03-27 DIAGNOSIS — J441 Chronic obstructive pulmonary disease with (acute) exacerbation: Secondary | ICD-10-CM | POA: Diagnosis not present

## 2021-03-27 DIAGNOSIS — I129 Hypertensive chronic kidney disease with stage 1 through stage 4 chronic kidney disease, or unspecified chronic kidney disease: Secondary | ICD-10-CM | POA: Diagnosis present

## 2021-03-27 DIAGNOSIS — Z9981 Dependence on supplemental oxygen: Secondary | ICD-10-CM

## 2021-03-27 DIAGNOSIS — Z8249 Family history of ischemic heart disease and other diseases of the circulatory system: Secondary | ICD-10-CM

## 2021-03-27 DIAGNOSIS — Z833 Family history of diabetes mellitus: Secondary | ICD-10-CM

## 2021-03-27 DIAGNOSIS — J1282 Pneumonia due to coronavirus disease 2019: Secondary | ICD-10-CM | POA: Diagnosis not present

## 2021-03-27 LAB — COMPREHENSIVE METABOLIC PANEL
ALT: 20 U/L (ref 0–44)
AST: 66 U/L — ABNORMAL HIGH (ref 15–41)
Albumin: 2.9 g/dL — ABNORMAL LOW (ref 3.5–5.0)
Alkaline Phosphatase: 91 U/L (ref 38–126)
Anion gap: 19 — ABNORMAL HIGH (ref 5–15)
BUN: 26 mg/dL — ABNORMAL HIGH (ref 8–23)
CO2: 23 mmol/L (ref 22–32)
Calcium: 8.5 mg/dL — ABNORMAL LOW (ref 8.9–10.3)
Chloride: 97 mmol/L — ABNORMAL LOW (ref 98–111)
Creatinine, Ser: 2.64 mg/dL — ABNORMAL HIGH (ref 0.44–1.00)
GFR, Estimated: 18 mL/min — ABNORMAL LOW (ref >60)
Glucose, Bld: 147 mg/dL — ABNORMAL HIGH (ref 70–99)
Potassium: 5.4 mmol/L — ABNORMAL HIGH (ref 3.5–5.1)
Sodium: 139 mmol/L (ref 135–145)
Total Bilirubin: 0.9 mg/dL (ref 0.3–1.2)
Total Protein: 6.5 g/dL (ref 6.5–8.1)

## 2021-03-27 LAB — BLOOD GAS, VENOUS
Acid-base deficit: 1.6 mmol/L (ref 0.0–2.0)
Bicarbonate: 21.2 mmol/L (ref 20.0–28.0)
FIO2: 28
O2 Saturation: 59.6 %
Patient temperature: 36.1
pCO2, Ven: 57.8 mmHg (ref 44.0–60.0)
pH, Ven: 7.251 (ref 7.250–7.430)
pO2, Ven: 43.1 mmHg (ref 32.0–45.0)

## 2021-03-27 LAB — PROTIME-INR
INR: 1 (ref 0.8–1.2)
Prothrombin Time: 13.1 seconds (ref 11.4–15.2)

## 2021-03-27 LAB — CBC WITH DIFFERENTIAL/PLATELET
Abs Immature Granulocytes: 0.1 10*3/uL — ABNORMAL HIGH (ref 0.00–0.07)
Basophils Absolute: 0.1 10*3/uL (ref 0.0–0.1)
Basophils Relative: 0 %
Eosinophils Absolute: 0.4 10*3/uL (ref 0.0–0.5)
Eosinophils Relative: 2 %
HCT: 45.4 % (ref 36.0–46.0)
Hemoglobin: 14.2 g/dL (ref 12.0–15.0)
Immature Granulocytes: 1 %
Lymphocytes Relative: 16 %
Lymphs Abs: 2.5 10*3/uL (ref 0.7–4.0)
MCH: 28.8 pg (ref 26.0–34.0)
MCHC: 31.3 g/dL (ref 30.0–36.0)
MCV: 92.1 fL (ref 80.0–100.0)
Monocytes Absolute: 1 10*3/uL (ref 0.1–1.0)
Monocytes Relative: 6 %
Neutro Abs: 12.3 10*3/uL — ABNORMAL HIGH (ref 1.7–7.7)
Neutrophils Relative %: 75 %
Platelets: 158 10*3/uL (ref 150–400)
RBC: 4.93 MIL/uL (ref 3.87–5.11)
RDW: 21.4 % — ABNORMAL HIGH (ref 11.5–15.5)
WBC: 16.4 10*3/uL — ABNORMAL HIGH (ref 4.0–10.5)
nRBC: 0 % (ref 0.0–0.2)

## 2021-03-27 LAB — RESP PANEL BY RT-PCR (FLU A&B, COVID) ARPGX2
Influenza A by PCR: NEGATIVE
Influenza B by PCR: NEGATIVE
SARS Coronavirus 2 by RT PCR: NEGATIVE

## 2021-03-27 LAB — LACTIC ACID, PLASMA
Lactic Acid, Venous: 6.6 mmol/L (ref 0.5–1.9)
Lactic Acid, Venous: 4.5 mmol/L (ref 0.5–1.9)
Lactic Acid, Venous: 3.8 mmol/L (ref 0.5–1.9)

## 2021-03-27 LAB — APTT: aPTT: 25 seconds (ref 24–36)

## 2021-03-27 LAB — C DIFFICILE QUICK SCREEN W PCR REFLEX
C Diff antigen: NEGATIVE
C Diff interpretation: NOT DETECTED
C Diff toxin: NEGATIVE

## 2021-03-27 MED ORDER — LEVOTHYROXINE SODIUM 75 MCG PO TABS
75.0000 ug | ORAL_TABLET | Freq: Every day | ORAL | Status: DC
  Administered 2021-03-28 – 2021-03-30 (×3): 75 ug via ORAL
  Filled 2021-03-27 (×4): qty 1

## 2021-03-27 MED ORDER — SODIUM CHLORIDE 0.9 % IV SOLN: 250.0000 mL | INTRAVENOUS | Status: DC

## 2021-03-27 MED ORDER — ACETAMINOPHEN 325 MG PO TABS: 650.0000 mg | ORAL_TABLET | Freq: Four times a day (QID) | ORAL | Status: DC | PRN

## 2021-03-27 MED ORDER — ONDANSETRON HCL 4 MG PO TABS: 4.0000 mg | ORAL_TABLET | Freq: Four times a day (QID) | ORAL | Status: DC | PRN

## 2021-03-27 MED ORDER — NOREPINEPHRINE 4 MG/250ML-% IV SOLN: 2.0000 ug/min | INTRAVENOUS | Status: DC

## 2021-03-27 MED ORDER — PANTOPRAZOLE SODIUM 40 MG PO TBEC
40.0000 mg | DELAYED_RELEASE_TABLET | Freq: Every day | ORAL | Status: DC
  Administered 2021-03-27 – 2021-03-30 (×4): 40 mg via ORAL
  Filled 2021-03-27 (×4): qty 1

## 2021-03-27 MED ORDER — SODIUM CHLORIDE 0.9 % IV SOLN
1.0000 g | INTRAVENOUS | Status: DC
  Filled 2021-03-27: qty 1

## 2021-03-27 MED ORDER — ALBUTEROL SULFATE (2.5 MG/3ML) 0.083% IN NEBU
2.5000 mg | INHALATION_SOLUTION | Freq: Four times a day (QID) | RESPIRATORY_TRACT | Status: DC | PRN
  Administered 2021-03-31: 2.5 mg via RESPIRATORY_TRACT
  Filled 2021-03-27: qty 3

## 2021-03-27 MED ORDER — ARFORMOTEROL TARTRATE 15 MCG/2ML IN NEBU
15.0000 ug | INHALATION_SOLUTION | Freq: Two times a day (BID) | RESPIRATORY_TRACT | Status: DC
  Administered 2021-03-27 – 2021-04-01 (×10): 15 ug via RESPIRATORY_TRACT
  Filled 2021-03-27 (×12): qty 2

## 2021-03-27 MED ORDER — METHYLPREDNISOLONE SODIUM SUCC 125 MG IJ SOLR
125.0000 mg | Freq: Once | INTRAMUSCULAR | Status: AC
  Administered 2021-03-27: 125 mg via INTRAVENOUS
  Filled 2021-03-27: qty 2

## 2021-03-27 MED ORDER — ONDANSETRON 4 MG PO TBDP
4.0000 mg | ORAL_TABLET | Freq: Once | ORAL | Status: AC
  Administered 2021-03-27: 4 mg via ORAL

## 2021-03-27 MED ORDER — SODIUM CHLORIDE 0.9 % IV SOLN: 2.0000 g | Freq: Two times a day (BID) | INTRAVENOUS | Status: DC

## 2021-03-27 MED ORDER — ACETAMINOPHEN 650 MG RE SUPP: 650.0000 mg | Freq: Four times a day (QID) | RECTAL | Status: DC | PRN

## 2021-03-27 MED ORDER — HEPARIN SODIUM (PORCINE) 5000 UNIT/ML IJ SOLN
5000.0000 [IU] | Freq: Three times a day (TID) | INTRAMUSCULAR | Status: DC
  Administered 2021-03-27 – 2021-04-01 (×16): 5000 [IU] via SUBCUTANEOUS
  Filled 2021-03-27 (×16): qty 1

## 2021-03-27 MED ORDER — LACTATED RINGERS IV BOLUS: 1000.0000 mL | Freq: Once | INTRAVENOUS | Status: DC

## 2021-03-27 MED ORDER — SODIUM CHLORIDE 0.9 % IV BOLUS
1000.0000 mL | Freq: Once | INTRAVENOUS | Status: AC
  Administered 2021-03-27: 1000 mL via INTRAVENOUS

## 2021-03-27 MED ORDER — FENTANYL CITRATE PF 50 MCG/ML IJ SOSY
50.0000 ug | PREFILLED_SYRINGE | INTRAMUSCULAR | Status: DC | PRN
  Administered 2021-03-29: 50 ug via INTRAVENOUS
  Filled 2021-03-27: qty 1

## 2021-03-27 MED ORDER — LACTATED RINGERS IV SOLN: INTRAVENOUS | Status: DC

## 2021-03-27 MED ORDER — ALPRAZOLAM 0.5 MG PO TABS
0.5000 mg | ORAL_TABLET | Freq: Three times a day (TID) | ORAL | Status: DC | PRN
  Administered 2021-03-30: 0.5 mg via ORAL
  Filled 2021-03-27: qty 1

## 2021-03-27 MED ORDER — ONDANSETRON HCL 4 MG/2ML IJ SOLN: 4.0000 mg | Freq: Four times a day (QID) | INTRAMUSCULAR | Status: DC | PRN

## 2021-03-27 MED ORDER — VANCOMYCIN HCL IN DEXTROSE 1-5 GM/200ML-% IV SOLN
1000.0000 mg | Freq: Once | INTRAVENOUS | Status: AC
  Administered 2021-03-27: 1000 mg via INTRAVENOUS
  Filled 2021-03-27: qty 200

## 2021-03-27 MED ORDER — VANCOMYCIN VARIABLE DOSE PER UNSTABLE RENAL FUNCTION (PHARMACIST DOSING): Status: DC

## 2021-03-27 MED ORDER — UMECLIDINIUM BROMIDE 62.5 MCG/ACT IN AEPB
1.0000 | INHALATION_SPRAY | Freq: Every day | RESPIRATORY_TRACT | Status: DC
  Administered 2021-03-28 – 2021-03-30 (×3): 1 via RESPIRATORY_TRACT
  Filled 2021-03-27 (×3): qty 7

## 2021-03-27 MED ORDER — SODIUM CHLORIDE 0.9 % IV SOLN
2.0000 g | Freq: Once | INTRAVENOUS | Status: AC
  Administered 2021-03-27: 2 g via INTRAVENOUS
  Filled 2021-03-27: qty 2

## 2021-03-27 MED ORDER — IPRATROPIUM-ALBUTEROL 0.5-2.5 (3) MG/3ML IN SOLN
3.0000 mL | Freq: Once | RESPIRATORY_TRACT | Status: AC
  Administered 2021-03-27: 3 mL via RESPIRATORY_TRACT
  Filled 2021-03-27: qty 3

## 2021-03-27 MED ORDER — LACTATED RINGERS IV BOLUS
1000.0000 mL | Freq: Once | INTRAVENOUS | Status: AC
  Administered 2021-03-27: 1000 mL via INTRAVENOUS

## 2021-03-27 NOTE — ED Notes (Signed)
Pts family given update  

## 2021-03-27 NOTE — ED Notes (Signed)
MD at bedside for central line. Pts remains hypotensive. Mentation intact

## 2021-03-27 NOTE — ED Notes (Signed)
Helped NT AP put pt on a purewick

## 2021-03-27 NOTE — ED Notes (Signed)
MD at bedside for MSE

## 2021-03-27 NOTE — ED Notes (Signed)
Cleaned pt bedding, new purewick, blankets given to pt.

## 2021-03-27 NOTE — ED Triage Notes (Signed)
Pt c/o n/v/d x2 days and headache. Pt. Also c/o abd pain.

## 2021-03-27 NOTE — ED Provider Notes (Signed)
[facility] EMERGENCY DEPARTMENT Provider Note   CSN: [id number] Arrival date & time: 03/15/2021  0813     History Chief Complaint  Patient presents with   Emesis    Marilyn Solomon is a 75 y.o. female with PMH COPD, chronic respiratory failure on 2 L home oxygen, HTN, hypothyroidism who presents emergency department for evaluation of nausea, vomiting, diarrhea and shortness of breath.  Patient states that over the last 48 hours she has had nausea, vomiting and diarrhea with associated headache and left upper quadrant abdominal pain.  She states that she feels very weak and appears ill on initial presentation.  Patient arrives hypotensive with initial systolics in the 60s but is alert and oriented answering questions appropriately.  Denies hematemesis hematochezia.  Denies fever, chest pain, sore throat or other systemic symptoms.   Emesis Associated symptoms: cough and diarrhea   Associated symptoms: no abdominal pain, no arthralgias, no chills, no fever and no sore throat       Past Medical History:  Diagnosis Date   Anemia    Anxiety    Arthritis    C. difficile diarrhea 10/08/2019   Chronic back pain    COPD (chronic obstructive pulmonary disease) (HCC)    GERD (gastroesophageal reflux disease)    HCAP (healthcare-associated pneumonia) 06/28/2017   Hypertension    Hypothyroidism    On home oxygen therapy    2L   Pneumonia due to COVID-19 virus 09/28/2019   Reflux    no medications currently, asymptomatic   Thyroid disease     Patient Active Problem List   Diagnosis Date Noted   COVID-19 virus infection 02/21/2021   Complete paralysis of right vocal cord 08/27/2020   Malignant neoplasm of thyroid gland (HCC) 07/08/2020   Other chronic pain 06/17/2020   Multinodular goiter 06/10/2020   Neck mass 06/03/2020   Hearing abnormally acute, right 06/03/2020   Chronic respiratory failure with hypoxia and hypercapnia (HCC) 05/11/2020   Irritable bowel syndrome with  atypical cp  05/11/2020   Acute kidney injury superimposed on CKD (HCC) 01/23/2020   Hypertension 06/02/2018   Positive colorectal cancer screening using Cologuard test 08/21/2017   Normocytic anemia    Anxiety 06/30/2017   Hypothyroidism 06/30/2017   GERD (gastroesophageal reflux disease) 06/30/2017   COPD with acute exacerbation (HCC) 06/30/2017   Elevated liver enzymes 06/30/2017   Severe Iron deficiency anemia 06/30/2017   Second degree hemorrhoids     Past Surgical History:  Procedure Laterality Date   ABDOMINAL HYSTERECTOMY     BIOPSY  10/08/2017   Procedure: BIOPSY;  Surgeon: Corbin Ade, MD;  Location: [facility];  Service: Endoscopy;;  gastric   cataracts Bilateral    COLONOSCOPY  2007   Dr. Jena Gauss: normal rectum, diverticula    COLONOSCOPY WITH PROPOFOL N/A 11/15/2015   Dr. Jena Gauss: grade II hemorrhoids, diverticulosis   COLONOSCOPY WITH PROPOFOL N/A 10/08/2017   Rourk: Diverticulosis   ESOPHAGOGASTRODUODENOSCOPY (EGD) WITH PROPOFOL N/A 10/08/2017   Rourk: Nonobstructing Schatzki ring, mild erosive reflux esophagitis, small hiatal hernia, gastritis but no H. pylori.   FRACTURE SURGERY Right 2005   Wrist   JOINT REPLACEMENT Left 2000   hip   RADICAL NECK DISSECTION Right 09/13/2020   Procedure: RADICAL NECK DISSECTION;  Surgeon: Christia Reading, MD;  Location: [facility] OR;  Service: ENT;  Laterality: Right;   THYROIDECTOMY N/A 09/13/2020   Procedure: TOTAL THYROIDECTOMY;  Surgeon: Christia Reading, MD;  Location: [facility] OR;  Service: ENT;  Laterality: N/A;  TOTAL HIP ARTHROPLASTY Left    WRIST SURGERY Right      OB History   No obstetric history on file.     Family History  Problem Relation Age of Onset   Bone cancer Sister    Pancreatic cancer Sister    Pneumonia Mother    Heart attack Father    Diabetes Sister    Dementia Sister    Colon cancer Neg Hx     Social History   Tobacco Use   Smoking status: Former    Packs/day: 25.00    Years: 1.00    Pack years:  25.00    Types: Cigarettes    Quit date: 08/27/1998    Years since quitting: 22.5   Smokeless tobacco: Never   Tobacco comments:    quit in 2000 Verified 01/28/21 MR   Vaping Use   Vaping Use: Never used  Substance Use Topics   Alcohol use: No    Alcohol/week: 0.0 standard drinks   Drug use: No    Home Medications Prior to Admission medications   Medication Sig Start Date End Date Taking? Authorizing Provider  albuterol (PROVENTIL) (2.5 MG/3ML) 0.083% nebulizer solution Take 3 mLs (2.5 mg total) by nebulization every 6 (six) hours as needed for wheezing or shortness of breath. 02/22/21   Roxan Hockey, MD  albuterol (VENTOLIN HFA) 108 (90 Base) MCG/ACT inhaler Inhale 1-2 puffs into the lungs every 6 (six) hours as needed for wheezing or shortness of breath. 02/22/21   Roxan Hockey, MD  ALPRAZolam Duanne Moron) 1 MG tablet Take 1 tablet (1 mg total) by mouth every 8 (eight) hours as needed for anxiety. 01/17/21   Lindell Spar, MD  amLODipine (NORVASC) 5 MG tablet Take 1 tablet (5 mg total) by mouth daily. Patient not taking: Reported on 03/24/2021 02/28/21   Roxan Hockey, MD  guaiFENesin (MUCINEX) 600 MG 12 hr tablet Take 1 tablet (600 mg total) by mouth 2 (two) times daily. Patient not taking: No sig reported 02/22/21   Roxan Hockey, MD  levothyroxine (SYNTHROID) 75 MCG tablet TAKE ONE TABLET BY MOUTH ONCE DAILY. 03/04/21   Lindell Spar, MD  ondansetron (ZOFRAN) 8 MG tablet Take 8 mg by mouth every 8 (eight) hours as needed for nausea/vomiting. 02/16/21   [provider]  oxyCODONE (ROXICODONE) 15 MG immediate release tablet Take 1 tablet (15 mg total) by mouth every 8 (eight) hours as needed (Severe pain). 03/17/21   Lindell Spar, MD  pantoprazole (PROTONIX) 40 MG tablet Take 1 tablet (40 mg total) by mouth daily. 02/22/21   Roxan Hockey, MD  promethazine-dextromethorphan (PROMETHAZINE-DM) 6.25-15 MG/5ML syrup Take 5 mLs by mouth 4 (four) times daily as needed for  cough. 01/06/21   Lindell Spar, MD  Tiotropium Bromide-Olodaterol (STIOLTO RESPIMAT) 2.5-2.5 MCG/ACT AERS Inhale 2 puffs into the lungs daily. Patient not taking: No sig reported 02/22/21   Roxan Hockey, MD  Vitamin D, Ergocalciferol, (DRISDOL) 1.25 MG (50000 UNIT) CAPS capsule Take 50,000 Units by mouth every Sunday.    [provider]    Allergies    Penicillins and Vicodin [hydrocodone-acetaminophen]  Review of Systems   Review of Systems  Constitutional:  Positive for fatigue. Negative for chills and fever.  HENT:  Negative for ear pain and sore throat.   Eyes:  Negative for pain and visual disturbance.  Respiratory:  Positive for cough and shortness of breath.   Cardiovascular:  Negative for chest pain and palpitations.  Gastrointestinal:  Positive for  diarrhea, nausea and vomiting. Negative for abdominal pain.  Genitourinary:  Negative for dysuria and hematuria.  Musculoskeletal:  Negative for arthralgias and back pain.  Skin:  Negative for color change and rash.  Neurological:  Negative for seizures and syncope.  All other systems reviewed and are negative.  Physical Exam Updated Vital Signs BP (!) 81/50   Pulse 90   Resp 20   Ht $R'5\' 5"'ZJ$  (1.651 m)   Wt 40.8 kg   SpO2 100%   BMI 14.98 kg/m   Physical Exam Vitals and nursing note reviewed.  Constitutional:      General: She is in acute distress.     Appearance: She is well-developed. She is ill-appearing.     Comments: Cachectic  HENT:     Head: Normocephalic and atraumatic.  Eyes:     Conjunctiva/sclera: Conjunctivae normal.  Cardiovascular:     Rate and Rhythm: Normal rate and regular rhythm.     Heart sounds: No murmur heard. Pulmonary:     Effort: Pulmonary effort is normal. No respiratory distress.     Breath sounds: Wheezing present.  Abdominal:     Palpations: Abdomen is soft.     Tenderness: There is abdominal tenderness (epigastric).  Musculoskeletal:     Cervical back: Neck supple.   Skin:    General: Skin is warm and dry.  Neurological:     Mental Status: She is alert.    ED Results / Procedures / Treatments   Labs (all labs ordered are listed, but only abnormal results are displayed) Labs Reviewed  CBC WITH DIFFERENTIAL/PLATELET - Abnormal; Notable for the following components:      Result Value   WBC 16.4 (*)    RDW 21.4 (*)    All other components within normal limits  CULTURE, BLOOD (SINGLE)  URINE CULTURE  LACTIC ACID, PLASMA  LACTIC ACID, PLASMA  COMPREHENSIVE METABOLIC PANEL  PROTIME-INR  APTT  URINALYSIS, ROUTINE W REFLEX MICROSCOPIC  BLOOD GAS, VENOUS    EKG None  Radiology No results found.  Procedures .Critical Care Performed by: Teressa Lower, MD Authorized by: Teressa Lower, MD   Critical care provider statement:    Critical care time (minutes):  90   Critical care was necessary to treat or prevent imminent or life-threatening deterioration of the following conditions:  Circulatory failure and sepsis   Critical care was time spent personally by me on the following activities:  Blood draw for specimens, ordering and performing treatments and interventions, ordering and review of laboratory studies, ordering and review of radiographic studies, review of old charts, vascular access procedures and pulse oximetry .Central Line  Date/Time: 03/23/2021 3:33 PM Performed by: Teressa Lower, MD Authorized by: Teressa Lower, MD   Consent:    Consent obtained:  Verbal   Consent given by:  Patient   Risks discussed:  Arterial puncture, incorrect placement, pneumothorax, nerve damage, infection and bleeding Pre-procedure details:    Indication(s): central venous access and insufficient peripheral access     Hand hygiene: Hand hygiene performed prior to insertion     Sterile barrier technique: All elements of maximal sterile technique followed     Skin preparation:  Chlorhexidine   Skin preparation agent: Skin preparation agent  completely dried prior to procedure   Sedation:    Sedation type:  None Anesthesia:    Anesthesia method:  Local infiltration   Local anesthetic:  Lidocaine 1% w/o epi Procedure details:    Location:  L internal jugular   Procedural supplies:  Triple lumen   Ultrasound guidance: yes     Ultrasound guidance timing: real time     Number of attempts:  1   Successful placement: no   Comments:     Access obtained, but wire met significant resistance.  Procedure aborted.   Medications Ordered in ED Medications  ceFEPIme (MAXIPIME) 2 g in sodium chloride 0.9 % 100 mL IVPB (has no administration in time range)  ipratropium-albuterol (DUONEB) 0.5-2.5 (3) MG/3ML nebulizer solution 3 mL (3 mLs Nebulization Given 03/07/2021 0901)  methylPREDNISolone sodium succinate (SOLU-MEDROL) 125 mg/2 mL injection 125 mg (125 mg Intravenous Given 03/02/2021 0916)  ondansetron (ZOFRAN-ODT) disintegrating tablet 4 mg (4 mg Oral Given 03/12/2021 0917)  sodium chloride 0.9 % bolus 1,000 mL (1,000 mLs Intravenous Bolus 03/24/2021 0919)    ED Course  I have reviewed the triage vital signs and the nursing notes.  Pertinent labs & imaging results that were available during my care of the patient were reviewed by me and considered in my medical decision making (see chart for details).    MDM Rules/Calculators/A&P                           Patient seen in the emergency department for evaluation abdominal pain, nausea, vomiting and hypotension.  Physical exam reveals a pale, cachectic, ill-appearing female with tenderness to palpation over the left upper quadrant.  Wheezing also heard on exam.  Patient given DuoNeb and methylprednisolone.  Patient arrives hypotensive to 60/40 and fluid resuscitation initiated.  Laboratory evaluation with a leukocytosis to 16.4, initial lactate 6.6, potassium 5.4, BUN 26 creatinine 2.64 which is a significant elevation for the patient.  Sepsis alert called and patient started on  broad-spectrum antibiotics.  Patient given 30 cc/kg lactated Ringer's and ultimately pressures improved.  A central line was attempted due to difficulty with peripheral access but unfortunately the patient was still volume down that I met significant resistance when trying to pass the wire and the procedure was aborted.  However, with pressures improving, this procedure was deferred.  Chest x-ray unremarkable outside of COPD.  CT abdomen pelvis without contrast performed due to low GFR and shows a moderately distended gallbladder with possible mild gallbladder wall thickening, extensive lucency around the left hip replacement likely advanced particle disease and may be source of infection but the patient has no hip pain today.  Patient will require admission for sepsis of unknown origin and possible acalculous cholecystitis.  Right upper quadrant ultrasound cannot be performed until tomorrow morning.  Patient then admitted. Final Clinical Impression(s) / ED Diagnoses Final diagnoses:  None    Rx / DC Orders ED Discharge Orders     None        Sakai Wolford, MD 02/26/2021 1540

## 2021-03-27 NOTE — Progress Notes (Signed)
[facility]link following for sepsis protocol. 

## 2021-03-27 NOTE — H&P (Addendum)
History and Physical    JAMILLAH CAMILO JQB:[id number] DOB: May 21, 1946 DOA: 02/28/2021  PCP: Lindell Spar, MD   Patient coming from: Home  I have personally briefly reviewed patient's old medical records in [place]  Chief Complaint: Abdominal pain, nausea, vomiting and diarrhea.  HPI: Marilyn Solomon is a 75 y.o. female with medical history significant of thyroid disease, gastroesophageal reflux disease, hypertension, COPD, chronic respiratory failure with hypoxia (using 2 L nasal cannula supplementation at baseline), papillary thyroid cancer stage 4b and a recent admission approximately a month ago secondary to COVID-pneumonia; who presented to the hospital secondary to abdominal pain, nausea, vomiting and diarrhea; no overt bleeding appreciated.  Abdominal pain described as dull/crampy in nature, 7-8/10 in intensity, associated with nausea/vomiting and no aggravating factors.  Her symptoms have been present for the last 2-3 days and worsening.  She reports decreased oral intake.  Patient also reports chills but never measured temperature at home.  No chest pain, no focal weaknesses, no dysuria or hematuria, no other complaints.  Patient vaccinated against COVID; no booster.  ED Course: Severe SIRS/Sepsis criteria meta at time of admission with temperature of 96.6, respiratory rate of 27, soft blood pressure with a systolic blood pressure in the 80s to 90's; elevated WBCs at 16.4 and a lactic acid of 6.6; source of infection appears to be a calculus cholecystitis and organ dysfunction acute on chronic renal failure.  Fluid resuscitation following sepsis protocol provided with good improvement in patient's blood pressure and decreasing her lactic acid to 4.5.  Cultures were taken and patient has been started on broad-spectrum antibiotics.  Review of Systems: As per HPI otherwise all other systems reviewed and are negative.  Past Medical History:  Diagnosis Date   Anemia     Anxiety    Arthritis    C. difficile diarrhea 10/08/2019   Chronic back pain    COPD (chronic obstructive pulmonary disease) (HCC)    GERD (gastroesophageal reflux disease)    HCAP (healthcare-associated pneumonia) 06/28/2017   Hypertension    Hypothyroidism    On home oxygen therapy    2L   Pneumonia due to COVID-19 virus 09/28/2019   Reflux    no medications currently, asymptomatic   Thyroid disease     Past Surgical History:  Procedure Laterality Date   ABDOMINAL HYSTERECTOMY     BIOPSY  10/08/2017   Procedure: BIOPSY;  Surgeon: Daneil Dolin, MD;  [facility];  Service: Endoscopy;;  gastric   cataracts Bilateral    COLONOSCOPY  2007   Dr. Gala Romney: normal rectum, diverticula    COLONOSCOPY WITH PROPOFOL N/A 11/15/2015   Dr. Gala Romney: grade II hemorrhoids, diverticulosis   COLONOSCOPY WITH PROPOFOL N/A 10/08/2017   Rourk: Diverticulosis   ESOPHAGOGASTRODUODENOSCOPY (EGD) WITH PROPOFOL N/A 10/08/2017   Rourk: Nonobstructing Schatzki ring, mild erosive reflux esophagitis, small hiatal hernia, gastritis but no H. pylori.   FRACTURE SURGERY Right 2005   Wrist   JOINT REPLACEMENT Left 2000   hip   RADICAL NECK DISSECTION Right 09/13/2020   Procedure: RADICAL NECK DISSECTION;  Surgeon: Melida Quitter, MD;  Location: [facility];  Service: ENT;  Laterality: Right;   THYROIDECTOMY N/A 09/13/2020   Procedure: TOTAL THYROIDECTOMY;  Surgeon: Melida Quitter, MD;  Location: [facility];  Service: ENT;  Laterality: N/A;   TOTAL HIP ARTHROPLASTY Left    WRIST SURGERY Right     Social History  reports that she quit smoking about 22 years ago. Her smoking  use included cigarettes. She has a 25.00 pack-year smoking history. She has never used smokeless tobacco. She reports that she does not drink alcohol and does not use drugs.  Allergies  Allergen Reactions   Penicillins Itching     Tolerated Ancef Intraop 09/13/20   Vicodin [Hydrocodone-Acetaminophen] Nausea And Vomiting    Family History   Problem Relation Age of Onset   Bone cancer Sister    Pancreatic cancer Sister    Pneumonia Mother    Heart attack Father    Diabetes Sister    Dementia Sister    Colon cancer Neg Hx     Prior to Admission medications   Medication Sig Start Date End Date Taking? Authorizing Provider  albuterol (PROVENTIL) (2.5 MG/3ML) 0.083% nebulizer solution Take 3 mLs (2.5 mg total) by nebulization every 6 (six) hours as needed for wheezing or shortness of breath. 02/22/21  Yes Emokpae, Courage, MD  albuterol (VENTOLIN HFA) 108 (90 Base) MCG/ACT inhaler Inhale 1-2 puffs into the lungs every 6 (six) hours as needed for wheezing or shortness of breath. 02/22/21  Yes Emokpae, Courage, MD  ALPRAZolam Duanne Moron) 1 MG tablet Take 1 tablet (1 mg total) by mouth every 8 (eight) hours as needed for anxiety. 01/17/21  Yes Lindell Spar, MD  levothyroxine (SYNTHROID) 75 MCG tablet TAKE ONE TABLET BY MOUTH ONCE DAILY. 03/04/21  Yes Lindell Spar, MD  magic mouthwash w/lidocaine SOLN Take 30 mLs by mouth 4 (four) times daily as needed. 03/09/21  Yes [provider]  ondansetron (ZOFRAN) 8 MG tablet Take 8 mg by mouth every 8 (eight) hours as needed for nausea/vomiting. 02/16/21  Yes [provider]  oxyCODONE (ROXICODONE) 15 MG immediate release tablet Take 1 tablet (15 mg total) by mouth every 8 (eight) hours as needed (Severe pain). 03/17/21  Yes Lindell Spar, MD  pantoprazole (PROTONIX) 40 MG tablet Take 1 tablet (40 mg total) by mouth daily. 02/22/21  Yes Emokpae, Courage, MD  Vitamin D, Ergocalciferol, (DRISDOL) 1.25 MG (50000 UNIT) CAPS capsule Take 50,000 Units by mouth every Sunday.   Yes [provider]  amLODipine (NORVASC) 5 MG tablet Take 1 tablet (5 mg total) by mouth daily. Patient not taking: No sig reported 02/28/21   Roxan Hockey, MD  guaiFENesin (MUCINEX) 600 MG 12 hr tablet Take 1 tablet (600 mg total) by mouth 2 (two) times daily. Patient not taking: No sig reported  02/22/21   Roxan Hockey, MD  promethazine-dextromethorphan (PROMETHAZINE-DM) 6.25-15 MG/5ML syrup Take 5 mLs by mouth 4 (four) times daily as needed for cough. 01/06/21   Lindell Spar, MD  Tiotropium Bromide-Olodaterol (STIOLTO RESPIMAT) 2.5-2.5 MCG/ACT AERS Inhale 2 puffs into the lungs daily. Patient not taking: No sig reported 02/22/21   Roxan Hockey, MD   Physical Exam: Vitals:   03/23/2021 1310 02/28/2021 1315 03/10/2021 1320 03/26/2021 1345  BP: 124/80 132/79 125/80 (!) 145/84  Pulse: 81 78 79 82  Resp: $Remo'14 12 12 13  'TXilo$ Temp:      TempSrc:      SpO2: 100% 100% 100% 100%  Weight:      Height:        Constitutional: Chronically ill in appearance, underweight and complaining of abdominal pain and diarrhea.  No chest pain, no shortness of breath. Vitals:   03/16/2021 1310 03/16/2021 1315 03/22/2021 1320 03/24/2021 1345  BP: 124/80 132/79 125/80 (!) 145/84  Pulse: 81 78 79 82  Resp: $Remo'14 12 12 13  'nluMp$ Temp:  TempSrc:      SpO2: 100% 100% 100% 100%  Weight:      Height:       Eyes: PERRL, lids and conjunctivae normal, no icterus, no nystagmus. ENMT: Mucous membranes are moist. Posterior pharynx clear of any exudate or lesions. Neck: normal, supple, no masses, no thyromegaly, no JVD. Respiratory: Good air movement bilaterally, no using accessory muscles, positive scattered rhonchi, no wheezing. Cardiovascular: Regular rate and rhythm; no rubs, no gallops, no lower extremity edema appreciated. Abdomen: Tenderness to palpation in her upper quadrants bilaterally (left more than right), positive bowel sounds, no guarding. Musculoskeletal: no clubbing / cyanosis. No joint deformity upper and lower extremities. Good ROM, no contractures. Normal muscle tone.  Skin: no rashes, no petechiae. Neurologic: CN 2-12 grossly intact.  No focal deficits; muscle strength 4 out of 5 bilaterally symmetrically in the setting of poor effort. Psychiatric: Normal judgment and insight.  Stable mood.  Patient  following commands appropriately.  Labs on Admission: I have personally reviewed following labs and imaging studies  CBC: Recent Labs  Lab 03/20/2021 0858  WBC 16.4*  NEUTROABS 12.3*  HGB 14.2  HCT 45.4  MCV 92.1  PLT 032    Basic Metabolic Panel: Recent Labs  Lab 03/07/2021 0858  NA 139  K 5.4*  CL 97*  CO2 23  GLUCOSE 147*  BUN 26*  CREATININE 2.64*  CALCIUM 8.5*    GFR: Estimated Creatinine Clearance: 12 mL/min (A) (by C-G formula based on SCr of 2.64 mg/dL (H)).  Liver Function Tests: Recent Labs  Lab 03/26/2021 0858  AST 66*  ALT 20  ALKPHOS 91  BILITOT 0.9  PROT 6.5  ALBUMIN 2.9*    Urine analysis:    Component Value Date/Time   COLORURINE AMBER (A) 06/21/2018 1450   APPEARANCEUR CLOUDY (A) 06/21/2018 1450   LABSPEC 1.020 06/21/2018 1450   PHURINE 5.0 06/21/2018 1450   GLUCOSEU NEGATIVE 06/21/2018 1450   HGBUR NEGATIVE 06/21/2018 1450   BILIRUBINUR NEGATIVE 06/21/2018 1450   KETONESUR NEGATIVE 06/21/2018 1450   PROTEINUR 30 (A) 06/21/2018 1450   UROBILINOGEN 0.2 03/25/2008 1702   NITRITE NEGATIVE 06/21/2018 1450   LEUKOCYTESUR TRACE (A) 06/21/2018 1450    Radiological Exams on Admission: CT ABDOMEN PELVIS WO CONTRAST  Result Date: 03/07/2021 CLINICAL DATA:  Left upper quadrant abdominal pain and sepsis. EXAM: CT ABDOMEN AND PELVIS WITHOUT CONTRAST TECHNIQUE: Multidetector CT imaging of the abdomen and pelvis was performed following the standard protocol without IV contrast. COMPARISON:  CT scan 10/07/2019 FINDINGS: Lower chest: Emphysematous changes and pulmonary scarring. Moderate peripheral peribronchial thickening. No focal infiltrates or pleural effusions. The heart is normal in size. No pericardial effusion. Stable aortic and coronary artery calcifications. Hepatobiliary: No obvious hepatic lesions without contrast. Gallbladder is moderately distended and there may be mild gallbladder wall thickening. I do not see any definite pericholecystic  inflammatory changes and no significant common bile duct dilatation. Pancreas: No mass, inflammation or ductal dilatation. Mild pancreatic atrophy. Spleen: Normal size.  No focal lesions. Adrenals/Urinary Tract: The adrenal glands and kidneys are grossly normal. No renal or obstructing ureteral calculi. No obvious renal or bladder lesions without contrast. Stomach/Bowel: The stomach, duodenum, small bowel and colon are grossly normal without oral contrast. No obvious acute inflammatory process, mass lesions or inflammatory changes. Sigmoid colon diverticulosis without findings for acute diverticulitis. Vascular/Lymphatic: Advanced atherosclerotic calcifications involving the aorta and branch vessels but no aneurysm. No mesenteric or retroperitoneal mass or adenopathy. Reproductive: The uterus is surgically absent. The  left ovary is not visualized for certain. The right ovary is still present and contains a small stable simple cyst measuring 15 mm. Other: No pelvic mass or adenopathy. No free pelvic fluid collections. No inguinal mass or adenopathy. No abdominal wall hernia or subcutaneous lesions. Musculoskeletal: Stable artifact associated with a left hip hemiarthroplasty. Extensive lucency surrounding the femoral prosthesis with protrusio. This extends all the way down into the ischial tuberosity and is likely advanced particle disease. IMPRESSION: 1. No acute abdominal/pelvic findings, mass lesions or adenopathy. 2. Moderately distended gallbladder with possible mild gallbladder wall thickening. No definite pericholecystic inflammatory changes or common bile duct dilatation. Right upper quadrant ultrasound examination may be helpful. 3. Advanced atherosclerotic calcifications involving the aorta and branch vessels. 4. Extensive lucency surrounding the left hip hemiarthroplasty with protrusion and extensive lucency surrounding the femoral prosthesis extending all the way down into the ischial tuberosity and likely  advanced particle disease. Aortic Atherosclerosis (ICD10-I70.0). Electronically Signed   By: Marijo Sanes M.D.   On: 03/20/2021 12:28   DG Chest Port 1 View  Result Date: 03/10/2021 CLINICAL DATA:  Fever, sepsis EXAM: PORTABLE CHEST 1 VIEW COMPARISON:  Previous studies including the examination of 02/21/2021 FINDINGS: Cardiac size is within normal limits. Low position of diaphragms suggests COPD. Increased interstitial markings in both apices suggest scarring. No new focal infiltrates are seen. There is no pleural effusion or pneumothorax. IMPRESSION: COPD. There are no signs of pulmonary edema or focal pulmonary consolidation. Electronically Signed   By: Elmer Picker M.D.   On: 03/02/2021 10:07    EKG: none   Assessment/Plan 1-severe sepsis: With concerns for acalculous cholecystitis -Patient met sepsis criteria at time of admission -General surgery will be consulted -Patient will be kept n.p.o. -Provide fluid resuscitation -Continue IV antibiotics and follow culture results. -Continue as needed antipyretics, antiemetics, analgesics and supportive care.  2-lactic acidosis -6.6 at time of admission -Appropriately trending down with fluid resuscitation -Vital signs currently stable. -Continue to follow trend.  3-acute on chronic renal failure stage IV-V at baseline -In the setting of prerenal azotemia and dehydration -Continue fluid resuscitation -Follow-up renal function trend -Minimize the use of nephrotoxic agents and avoid the use of contrast.  4-chronic respiratory failure with hypoxia secondary to COPD -Continue oxygen supplementation -Continue as needed bronchodilators management -Patient denies shortness of breath and is currently no wheezing.  5-gastroesophageal reflux disease -Continue PPI  6-hypertension -Holding antihypertensive agents currently in the setting of sepsis with initial presentation of soft blood pressure. -Closely follow vital  signs.  7-hypothyroidism -Continue Synthroid  8-history of anxiety -Continue as needed Xanax. -Mood stable currently.  9-Papillary thyroid cancer with metastasis to mediastinum   -holding chemotherapy agents currently in the setting of sepsis -patient will continue outpatient follow up with oncology service.  10-severe protein calorie malnutrition -Body mass index is 14.98 kg/m. -Patient will benefit of feeding supplement when able to tolerate by mouth -Will discuss with our nutritional service when appropriate.   DVT prophylaxis: Heparin Code Status:   Full code Family Communication:  Daughter over the phone. Disposition Plan:   Patient is from:  Home  Anticipated DC to:  To be determined  Anticipated DC date:  To be determined  Anticipated DC barriers: Stabilization of patient's sepsis and further decision about ongoing cholecystitis process. Prognosis is guarded as patient is at high risk for decompensation and is already very frail to start with.   Consults called:  General surgery Admission status:  Stepdown, length of stay more  than 2 midnights; inpatient status.  Severity of Illness: The appropriate patient status for this patient is INPATIENT. Inpatient status is judged to be reasonable and necessary in order to provide the required intensity of service to ensure the patient's safety. The patient's presenting symptoms, physical exam findings, and initial radiographic and laboratory data in the context of their chronic comorbidities is felt to place them at high risk for further clinical deterioration. Furthermore, it is not anticipated that the patient will be medically stable for discharge from the hospital within 2 midnights of admission.   * I certify that at the point of admission it is my clinical judgment that the patient will require inpatient hospital care spanning beyond 2 midnights from the point of admission due to high intensity of service, high risk for further  deterioration and high frequency of surveillance required.Barton Dubois MD [facility]  How to contact the [facility] Attending or Consulting provider [place] or covering provider during after hours [place], for this patient?   Check the care team in [facility] and look for a) attending/consulting [facility] provider listed and b) the [facility] team listed Log into [email or web address] and use 's universal password to access. If you do not have the password, please contact the hospital operator. Locate the [facility] provider you are looking for under [facility] and page to a number that you can be directly reached. If you still have difficulty reaching the provider, please page the [facility] (Director on Call) for the Hospitalists listed on amion for assistance.  03/26/2021, 2:59 PM

## 2021-03-27 NOTE — ED Notes (Signed)
First set of blood cultures drawn before antibiotics started. Pt is a hard stick.

## 2021-03-27 NOTE — ED Notes (Signed)
MD unable to obtain central line access at this time. Pts BP has normalized to normotensive. Will continue to monitor. Per MD hold Levo at this time

## 2021-03-27 NOTE — Progress Notes (Signed)
Pharmacy Antibiotic Note  Marilyn Solomon a 75 y.o. female admitted on 03/22/2021 with sepsis.  Pharmacy has been consulted for vancomycin dosing.  Plan: Vancomycin 1gm x 1  Following pharmacist to order doses based off renal function changes   Medical History: Past Medical History:  Diagnosis Date   Anemia    Anxiety    Arthritis    C. difficile diarrhea 10/08/2019   Chronic back pain    COPD (chronic obstructive pulmonary disease) (HCC)    GERD (gastroesophageal reflux disease)    HCAP (healthcare-associated pneumonia) 06/28/2017   Hypertension    Hypothyroidism    On home oxygen therapy    2L   Pneumonia due to COVID-19 virus 09/28/2019   Reflux    no medications currently, asymptomatic   Thyroid disease     Allergies:  Allergies  Allergen Reactions   Penicillins Itching     Tolerated Ancef Intraop 09/13/20   Vicodin [Hydrocodone-Acetaminophen] Nausea And Vomiting    Filed Weights   03/23/2021 0839  Weight: 40.8 kg (90 lb)    CBC Latest Ref Rng & Units 02/26/2021 02/22/2021 02/21/2021  WBC 4.0 - 10.5 K/uL 16.4(H) 5.0 7.9  Hemoglobin 12.0 - 15.0 g/dL 14.2 10.0(L) 11.4(L)  Hematocrit 36.0 - 46.0 % 45.4 34.2(L) 38.5  Platelets 150 - 400 K/uL 158 196 236     Estimated Creatinine Clearance: 12 mL/min (A) (by C-G formula based on SCr of 2.64 mg/dL (H)).  Antibiotics Given (last 72 hours)     Date/Time Action Medication Dose Rate   03/21/2021 1008 New Bag/Given   ceFEPIme (MAXIPIME) 2 g in sodium chloride 0.9 % 100 mL IVPB 2 g 200 mL/hr   03/21/2021 1121 New Bag/Given   vancomycin (VANCOCIN) IVPB 1000 mg/200 mL premix 1,000 mg 200 mL/hr       Antimicrobials this admission:  vancomycin 03/21/2021  >>  Cefepime 2gm x 1   Microbiology results: 03/13/2021  BCx: sent 03/14/2021  UCx: sent 03/10/2021  Resp Panel: sent  03/09/2021  MRSA PCR: sent  Thank you for allowing pharmacy to be a part of this patient's care.  Thomasenia Sales, PharmD Clinical  Pharmacist

## 2021-03-28 ENCOUNTER — Other Ambulatory Visit: Payer: Self-pay

## 2021-03-28 ENCOUNTER — Inpatient Hospital Stay (HOSPITAL_COMMUNITY): Payer: Medicare Other

## 2021-03-28 ENCOUNTER — Encounter (HOSPITAL_COMMUNITY): Payer: Self-pay | Admitting: Internal Medicine

## 2021-03-28 DIAGNOSIS — L8990 Pressure ulcer of unspecified site, unspecified stage: Secondary | ICD-10-CM | POA: Insufficient documentation

## 2021-03-28 DIAGNOSIS — L89152 Pressure ulcer of sacral region, stage 2: Secondary | ICD-10-CM | POA: Diagnosis not present

## 2021-03-28 DIAGNOSIS — J441 Chronic obstructive pulmonary disease with (acute) exacerbation: Secondary | ICD-10-CM | POA: Diagnosis not present

## 2021-03-28 DIAGNOSIS — I10 Essential (primary) hypertension: Secondary | ICD-10-CM

## 2021-03-28 DIAGNOSIS — A419 Sepsis, unspecified organism: Secondary | ICD-10-CM | POA: Diagnosis not present

## 2021-03-28 DIAGNOSIS — Z515 Encounter for palliative care: Secondary | ICD-10-CM | POA: Diagnosis not present

## 2021-03-28 DIAGNOSIS — R6520 Severe sepsis without septic shock: Secondary | ICD-10-CM | POA: Diagnosis not present

## 2021-03-28 DIAGNOSIS — Z7189 Other specified counseling: Secondary | ICD-10-CM | POA: Diagnosis not present

## 2021-03-28 DIAGNOSIS — K819 Cholecystitis, unspecified: Secondary | ICD-10-CM | POA: Diagnosis not present

## 2021-03-28 DIAGNOSIS — R109 Unspecified abdominal pain: Secondary | ICD-10-CM | POA: Diagnosis not present

## 2021-03-28 LAB — CBC
HCT: 35.6 % — ABNORMAL LOW (ref 36.0–46.0)
Hemoglobin: 11.1 g/dL — ABNORMAL LOW (ref 12.0–15.0)
MCH: 29 pg (ref 26.0–34.0)
MCHC: 31.2 g/dL (ref 30.0–36.0)
MCV: 93 fL (ref 80.0–100.0)
Platelets: 97 10*3/uL — ABNORMAL LOW (ref 150–400)
RBC: 3.83 MIL/uL — ABNORMAL LOW (ref 3.87–5.11)
RDW: 20.9 % — ABNORMAL HIGH (ref 11.5–15.5)
WBC: 9 10*3/uL (ref 4.0–10.5)
nRBC: 0 % (ref 0.0–0.2)

## 2021-03-28 LAB — COMPREHENSIVE METABOLIC PANEL
ALT: 16 U/L (ref 0–44)
AST: 47 U/L — ABNORMAL HIGH (ref 15–41)
Albumin: 2.1 g/dL — ABNORMAL LOW (ref 3.5–5.0)
Alkaline Phosphatase: 65 U/L (ref 38–126)
Anion gap: 10 (ref 5–15)
BUN: 27 mg/dL — ABNORMAL HIGH (ref 8–23)
CO2: 25 mmol/L (ref 22–32)
Calcium: 7 mg/dL — ABNORMAL LOW (ref 8.9–10.3)
Chloride: 103 mmol/L (ref 98–111)
Creatinine, Ser: 1.85 mg/dL — ABNORMAL HIGH (ref 0.44–1.00)
GFR, Estimated: 28 mL/min — ABNORMAL LOW (ref >60)
Glucose, Bld: 100 mg/dL — ABNORMAL HIGH (ref 70–99)
Potassium: 5.1 mmol/L (ref 3.5–5.1)
Sodium: 138 mmol/L (ref 135–145)
Total Bilirubin: 0.9 mg/dL (ref 0.3–1.2)
Total Protein: 4.9 g/dL — ABNORMAL LOW (ref 6.5–8.1)

## 2021-03-28 LAB — MAGNESIUM: Magnesium: 1.6 mg/dL — ABNORMAL LOW (ref 1.7–2.4)

## 2021-03-28 LAB — PHOSPHORUS: Phosphorus: 5 mg/dL — ABNORMAL HIGH (ref 2.5–4.6)

## 2021-03-28 LAB — MRSA NEXT GEN BY PCR, NASAL: MRSA by PCR Next Gen: NOT DETECTED

## 2021-03-28 MED ORDER — SODIUM CHLORIDE 0.9 % IV SOLN
2.0000 g | INTRAVENOUS | Status: DC
  Administered 2021-03-28 – 2021-03-29 (×2): 2 g via INTRAVENOUS
  Filled 2021-03-28 (×2): qty 2

## 2021-03-28 MED ORDER — CHLORHEXIDINE GLUCONATE CLOTH 2 % EX PADS
6.0000 | MEDICATED_PAD | Freq: Every day | CUTANEOUS | Status: DC
  Administered 2021-03-28 – 2021-04-01 (×5): 6 via TOPICAL

## 2021-03-28 MED ORDER — SODIUM CHLORIDE 0.9 % IV SOLN: 2.0000 g | INTRAVENOUS | Status: DC

## 2021-03-28 MED ORDER — MAGNESIUM SULFATE IN D5W 1-5 GM/100ML-% IV SOLN
1.0000 g | Freq: Once | INTRAVENOUS | Status: AC
  Administered 2021-03-28: 1 g via INTRAVENOUS
  Filled 2021-03-28: qty 100

## 2021-03-28 MED ORDER — MAGNESIUM SULFATE 50 % IJ SOLN: 1.0000 g | Freq: Once | INTRAMUSCULAR | Status: DC

## 2021-03-28 NOTE — Progress Notes (Signed)
PROGRESS NOTE    Marilyn Solomon  AST:[id number] DOB: 03/25/1946 DOA: 03/09/2021 PCP: Lindell Spar, MD    Chief Complaint  Patient presents with   Emesis    Brief Narrative:  Marilyn Solomon is a 75 y.o. female with medical history significant of thyroid disease, gastroesophageal reflux disease, hypertension, COPD, chronic respiratory failure with hypoxia (using 2 L nasal cannula supplementation at baseline), papillary thyroid cancer stage 4b and a recent admission approximately a month ago secondary to COVID-pneumonia; who presented to the hospital secondary to abdominal pain, nausea, vomiting and diarrhea; no overt bleeding appreciated.  Abdominal pain described as dull/crampy in nature, 7-8/10 in intensity, associated with nausea/vomiting and no aggravating factors.  Her symptoms have been present for the last 2-3 days and worsening.  She reports decreased oral intake.  Patient also reports chills but never measured temperature at home.  No chest pain, no focal weaknesses, no dysuria or hematuria, no other complaints.   Patient vaccinated against COVID; no booster.   ED Course: Severe SIRS/Sepsis criteria meta at time of admission with temperature of 96.6, respiratory rate of 27, soft blood pressure with a systolic blood pressure in the 80s to 90's; elevated WBCs at 16.4 and a lactic acid of 6.6; source of infection appears to be a calculus cholecystitis and organ dysfunction acute on chronic renal failure.  Fluid resuscitation following sepsis protocol provided with good improvement in patient's blood pressure and decreasing her lactic acid to 4.5.  Cultures were taken and patient has been started on broad-spectrum antibiotics.  Assessment & Plan: 1-severe sepsis: -patient met severe sepsis criteria at time of admission with elevated resp rate, soft BP, elevated WBC's, hypothermia, presume source of infection acalculous cholecystitis and renal failure for organ  dysfunction. -continue IVF's, antibiotics and supportive care -patient is NPO -will follow RUQ [place] -general surgery consulted and will follow rec's. -lactic acid has improved to 3.8 -MRSA PCR is neg, vancomycin will be discontinued.  2-acute on chronic renal failure; stage 4 at baseline -in the setting of pre-renal azotemia and dehydration -continue IVF's -avoid nephrotoxic agents and contrast -follow renal function trend  -Cr level improving   3-chronic resp failure with hypoxia due to COPD -continue home baseline O2 supplementation -continue bronchodilators -no wheezing or SOB appreciated.  4-GERD -continue PPI  5-nausea/Vomiting/Diarrhea -in the setting of problem #1 -continue supportive care, antiemetics and analgesics -C. Diff neg  6-HTN -stable currently -will hold antihypertensive for now and follow VS  7-hypothyroidism -continue synthroid   8-papillary thyroid cancer -holding chemotherapy currently in the setting of infection -continue outpatient follow up with oncology  9-severe protein calorie malnutrition  -Body mass index is 14.98 kg/m. -feeding supplements and nutritional service will be pursuit.  10-stage pressure injury -localized in his sacrum, POA -no signs of superimposed infection -continue barrier therapy and constant repositioning   DVT prophylaxis: heparin. Code Status: Full code. Family Communication: no family at bedside; daughter updated over the phone. Disposition:   Status is: Inpatient   Consultants:  General surgery . Palliative care.  Procedures:  See below for x-ray reports.   Antimicrobials:  Vancomycin and cefepime    Subjective: No nausea, no CP, no SOB and no further vomiting. Patient reported 1 episode of diarrhea overnight. Reports abd pain is better and she is hungry.  Objective: Vitals:   03/28/21 0500 03/28/21 0600 03/28/21 0722 03/28/21 0741  BP: 128/69 131/63    Pulse: 73 66  63  Resp: 18 13  11  Temp: (!) 97.1 F (36.2 C)   (!) 96.1 F (35.6 C)  TempSrc: Oral   Axillary  SpO2: 100% 100% 100% 100%  Weight:      Height:        Intake/Output Summary (Last 24 hours) at 03/28/2021 0852 Last data filed at 03/07/2021 1110 Gross per 24 hour  Intake 1100 ml  Output --  Net 1100 ml   Filed Weights   03/03/2021 0839  Weight: 40.8 kg    Examination: General exam: Appears calm and more comfortable today; currently afebrile and expressing to be hungry. No nausea, no vomiting currently. Patient reported still having some abd pain, but improved.   Respiratory system: good air movement, no wheezing, no crackles.  Cardiovascular system: S1 & S2 heard, no rubs, no gallops.  Gastrointestinal system: Abdomen is nondistended, soft and just mildly tender to palpation in her mid/upper quadrants area. Positive BS. Central nervous system: Alert and oriented. No focal neurological deficits. Extremities: no cyanosis, no clubbing, no edema. Skin: No petechiae; patient with stage 2 pressure injury present on admission; no signs of superimposed infection. Psychiatry: Judgement and insight appear normal. Mood & affect appropriate.     Data Reviewed: I have personally reviewed following labs and imaging studies  CBC: Recent Labs  Lab 03/07/2021 0858 03/28/21 0438  WBC 16.4* 9.0  NEUTROABS 12.3*  --   HGB 14.2 11.1*  HCT 45.4 35.6*  MCV 92.1 93.0  PLT 158 97*    Basic Metabolic Panel: Recent Labs  Lab 03/22/2021 0858 03/28/21 0438  NA 139 138  K 5.4* 5.1  CL 97* 103  CO2 23 25  GLUCOSE 147* 100*  BUN 26* 27*  CREATININE 2.64* 1.85*  CALCIUM 8.5* 7.0*  MG  --  1.6*  PHOS  --  5.0*    GFR: Estimated Creatinine Clearance: 17.2 mL/min (A) (by C-G formula based on SCr of 1.85 mg/dL (H)).  Liver Function Tests: Recent Labs  Lab 02/26/2021 0858 03/28/21 0438  AST 66* 47*  ALT 20 16  ALKPHOS 91 65  BILITOT 0.9 0.9  PROT 6.5 4.9*  ALBUMIN 2.9* 2.1*    CBG: No results for  input(s): GLUCAP in the last 168 hours.  Recent Results (from the past 240 hour(s))  Resp Panel by RT-PCR (Flu A&B, Covid) Nasopharyngeal Swab     Status: None   Collection Time: 03/28/2021  6:36 PM   Specimen: Nasopharyngeal Swab; Nasopharyngeal(NP) swabs in vial transport medium  Result Value Ref Range Status   SARS Coronavirus 2 by RT PCR NEGATIVE NEGATIVE Final    Comment: (NOTE) SARS-CoV-2 target nucleic acids are NOT DETECTED.  The SARS-CoV-2 RNA is generally detectable in upper respiratory specimens during the acute phase of infection. The lowest concentration of SARS-CoV-2 viral copies this assay can detect is 138 copies/mL. A negative result does not preclude SARS-Cov-2 infection and should not be used as the sole basis for treatment or other patient management decisions. A negative result may occur with  improper specimen collection/handling, submission of specimen other than nasopharyngeal swab, presence of viral mutation(s) within the areas targeted by this assay, and inadequate number of viral copies(<138 copies/mL). A negative result must be combined with clinical observations, patient history, and epidemiological information. The expected result is Negative.  Fact Sheet for Patients:  EntrepreneurPulse.com.au  Fact Sheet for Healthcare Providers:  IncredibleEmployment.be  This test is no t yet approved or cleared by the [place] FDA and  has been authorized for detection and/or diagnosis  of SARS-CoV-2 by FDA under an Emergency Use Authorization (EUA). This EUA will remain  in effect (meaning this test can be used) for the duration of the COVID-19 declaration under Section 564(b)(1) of the Act, 21 U.S.C.section 360bbb-3(b)(1), unless the authorization is terminated  or revoked sooner.       Influenza A by PCR NEGATIVE NEGATIVE Final   Influenza B by PCR NEGATIVE NEGATIVE Final    Comment: (NOTE) The Xpert Xpress  SARS-CoV-2/FLU/RSV plus assay is intended as an aid in the diagnosis of influenza from Nasopharyngeal swab specimens and should not be used as a sole basis for treatment. Nasal washings and aspirates are unacceptable for Xpert Xpress SARS-CoV-2/FLU/RSV testing.  Fact Sheet for Patients: EntrepreneurPulse.com.au  Fact Sheet for Healthcare Providers: IncredibleEmployment.be  This test is not yet approved or cleared by the [place] FDA and has been authorized for detection and/or diagnosis of SARS-CoV-2 by FDA under an Emergency Use Authorization (EUA). This EUA will remain in effect (meaning this test can be used) for the duration of the COVID-19 declaration under Section 564(b)(1) of the Act, 21 U.S.C. section 360bbb-3(b)(1), unless the authorization is terminated or revoked.  Performed at [facility], [address], [place], [place] [address]   C Difficile Quick Screen w PCR reflex     Status: None   Collection Time: 03/11/2021 10:07 PM   Specimen: STOOL  Result Value Ref Range Status   C Diff antigen NEGATIVE NEGATIVE Final   C Diff toxin NEGATIVE NEGATIVE Final   C Diff interpretation No C. difficile detected.  Final    Comment: Performed at [facility], [address], [place], [place] [address]  MRSA Next Gen by PCR, Nasal     Status: None   Collection Time: 03/28/21  5:45 AM   Specimen: Nasal Mucosa; Nasal Swab  Result Value Ref Range Status   MRSA by PCR Next Gen NOT DETECTED NOT DETECTED Final    Comment: (NOTE) The GeneXpert MRSA Assay (FDA approved for NASAL specimens only), is one component of a comprehensive MRSA colonization surveillance program. It is not intended to diagnose MRSA infection nor to guide or monitor treatment for MRSA infections. Test performance is not FDA approved in patients less than 85 years old. Performed at [facility], [address], [place], [place] [address]      Radiology Studies: CT ABDOMEN  PELVIS WO CONTRAST  Result Date: 03/22/2021 CLINICAL DATA:  Left upper quadrant abdominal pain and sepsis. EXAM: CT ABDOMEN AND PELVIS WITHOUT CONTRAST TECHNIQUE: Multidetector CT imaging of the abdomen and pelvis was performed following the standard protocol without IV contrast. COMPARISON:  CT scan 10/07/2019 FINDINGS: Lower chest: Emphysematous changes and pulmonary scarring. Moderate peripheral peribronchial thickening. No focal infiltrates or pleural effusions. The heart is normal in size. No pericardial effusion. Stable aortic and coronary artery calcifications. Hepatobiliary: No obvious hepatic lesions without contrast. Gallbladder is moderately distended and there may be mild gallbladder wall thickening. I do not see any definite pericholecystic inflammatory changes and no significant common bile duct dilatation. Pancreas: No mass, inflammation or ductal dilatation. Mild pancreatic atrophy. Spleen: Normal size.  No focal lesions. Adrenals/Urinary Tract: The adrenal glands and kidneys are grossly normal. No renal or obstructing ureteral calculi. No obvious renal or bladder lesions without contrast. Stomach/Bowel: The stomach, duodenum, small bowel and colon are grossly normal without oral contrast. No obvious acute inflammatory process, mass lesions or inflammatory changes. Sigmoid colon diverticulosis without findings for acute diverticulitis. Vascular/Lymphatic: Advanced atherosclerotic calcifications involving  the aorta and branch vessels but no aneurysm. No mesenteric or retroperitoneal mass or adenopathy. Reproductive: The uterus is surgically absent. The left ovary is not visualized for certain. The right ovary is still present and contains a small stable simple cyst measuring 15 mm. Other: No pelvic mass or adenopathy. No free pelvic fluid collections. No inguinal mass or adenopathy. No abdominal wall hernia or subcutaneous lesions. Musculoskeletal: Stable artifact associated with a left hip  hemiarthroplasty. Extensive lucency surrounding the femoral prosthesis with protrusio. This extends all the way down into the ischial tuberosity and is likely advanced particle disease. IMPRESSION: 1. No acute abdominal/pelvic findings, mass lesions or adenopathy. 2. Moderately distended gallbladder with possible mild gallbladder wall thickening. No definite pericholecystic inflammatory changes or common bile duct dilatation. Right upper quadrant ultrasound examination may be helpful. 3. Advanced atherosclerotic calcifications involving the aorta and branch vessels. 4. Extensive lucency surrounding the left hip hemiarthroplasty with protrusion and extensive lucency surrounding the femoral prosthesis extending all the way down into the ischial tuberosity and likely advanced particle disease. Aortic Atherosclerosis (ICD10-I70.0). Electronically Signed   By: Marijo Sanes M.D.   On: 03/04/2021 12:28   DG Chest Port 1 View  Result Date: 03/10/2021 CLINICAL DATA:  Fever, sepsis EXAM: PORTABLE CHEST 1 VIEW COMPARISON:  Previous studies including the examination of 02/21/2021 FINDINGS: Cardiac size is within normal limits. Low position of diaphragms suggests COPD. Increased interstitial markings in both apices suggest scarring. No new focal infiltrates are seen. There is no pleural effusion or pneumothorax. IMPRESSION: COPD. There are no signs of pulmonary edema or focal pulmonary consolidation. Electronically Signed   By: Elmer Picker M.D.   On: 03/26/2021 10:07     Scheduled Meds:  arformoterol  15 mcg Nebulization BID   And   umeclidinium bromide  1 puff Inhalation Daily   Chlorhexidine Gluconate Cloth  6 each Topical Daily   heparin  5,000 Units Subcutaneous Q8H   levothyroxine  75 mcg Oral Daily   pantoprazole  40 mg Oral Daily   vancomycin variable dose per unstable renal function (pharmacist dosing)   Does not apply See admin instructions   Continuous Infusions:  sodium chloride      ceFEPime (MAXIPIME) IV     lactated ringers 100 mL/hr at 03/19/2021 2124   magnesium sulfate bolus IVPB       LOS: 1 day    Time spent: 35 minutes   Barton Dubois, MD [facility]   To contact the attending provider between 7A-7P or the covering provider during after hours 7P-7A, please log into the web site [email or web address] and access using universal [place] password for that web site. If you do not have the password, please call the hospital operator.  03/28/2021, 8:52 AM

## 2021-03-28 NOTE — Consult Note (Signed)
Consultation Note Date: 03/28/2021   Patient Name: Marilyn Solomon  DOB: [birth year]  MRN: [id number]  Age / Sex: 75 y.o., female  PCP: Lindell Spar, MD Referring Physician: Barton Dubois, MD  Reason for Consultation: Establishing goals of care  HPI/Patient Profile: 75 y.o. female  with past medical history of papillary thyroid cancer stage IVb, thyroid disease, GERD, HTN, COPD with chronic respiratory failure/hypoxia/2 L nasal cannula, admission end of September for COVID, BMI 15, chronic back pain, arthritis, anemia admitted on 03/28/2021 with severe sepsis with presumed source infection from a calculus cholecystitis.   Clinical Assessment and Goals of Care: I have reviewed medical records including EPIC notes, labs and imaging, received report from RN, assessed the patient and then met at the bedside along with daughter [name] and Crystal to discuss diagnosis prognosis, GOC, EOL wishes, disposition and options.  I introduced Palliative Medicine as specialized medical care for people living with serious illness. It focuses on providing relief from the symptoms and stress of a serious illness. The goal is to improve quality of life for both the patient and the family.  We discussed a brief life review of the patient.  Mrs. Broome lives in her own home with her eldest son who is disabled and his daughter who is still in school.  Another son was living with him but he is currently in jail.  Daughter [name] comes daily to assist with ADLs and meal prep.    We focused on their current illness.  We talked about sepsis, with a quick turnaround/improvement.  We talked about surgery consult, and watchful waiting/further testing for gallbladder.  The natural disease trajectory and expectations at EOL were discussed.  Advanced directives, concepts specific to code status, artifical feeding and hydration, and  rehospitalization were considered and discussed.  [name] shares that they have had CODE STATUS discussions and if Mrs. Pendelton needed life support due to an acute illness such as she has just experience, they would accept life support.  She shares that if it was from worsening condition, frailty, they would elect DNR.  Palliative Care services outpatient were explained and offered.  Patient and [name] agree that outpatient palliative services for extra layer of support/guidance would be a help to them.  Provider choice offered.  They [facility] hosp[facility].  Discussed the importance of continued conversation with family and the medical providers regarding overall plan of care and treatment options, ensuring decisions are within the context of the patient's values and GOCs.  Questions and concerns were addressed. The patient and family was encouraged to call with questions or concerns.  PMT will continue to support holistically.  Conference with attending, bedside nursing staff, transition of care team related to patient condition, needs, goals of care, disposition.   HCPOA  NEXT OF KIN -daughter, Pamelyn Bancroft, would be healthcare surrogate.  Mrs. Yandell has 2 sons, her eldest son lives with her, and is disabled.  Another son is currently in jail.    SUMMARY OF RECOMMENDATIONS  At this point continue to treat the treatable. Full scope/full code at this time. Following up with outpatient oncology in [place], considering transition to [place] to outpatient palliative services with [place]: Full code -[name] states that she has had CODE STATUS discussions with Mrs. Gaulin recently.  She shares that at this point they would like full scope/full code, but realizes that as things change, she worsens, in the future they would likely elected DNR status.  Symptom Management:  Per hospitalist, no additional needs at this  time.  Palliative Prophylaxis:  Frequent Pain Assessment and Oral Care  Additional Recommendations (Limitations, Scope, Preferences): Full Scope Treatment  Psycho-social/Spiritual:  Desire for further Chaplaincy support:no Additional Recommendations: Caregiving  Support/Resources  Prognosis:  Unable to determine, based on outcomes, oncology recommendations/treatments.   Discharge Planning:  To be determined, based on outcomes.  Anticipate home with outpatient palliative       Primary Diagnoses: Present on Admission:  Severe sepsis ([place])   I have reviewed the medical record, interviewed the patient and family, and examined the patient. The following aspects are pertinent.  Past Medical History:  Diagnosis Date   Anemia    Anxiety    Arthritis    C. difficile diarrhea 10/08/2019   Chronic back pain    COPD (chronic obstructive pulmonary disease) (HCC)    GERD (gastroesophageal reflux disease)    HCAP (healthcare-associated pneumonia) 06/28/2017   Hypertension    Hypothyroidism    On home oxygen therapy    2L   Pneumonia due to COVID-19 virus 09/28/2019   Reflux    no medications currently, asymptomatic   Thyroid disease    Social History   Socioeconomic History   Marital status: Widowed    Spouse name: Not on file   Number of children: Not on file   Years of education: Not on file   Highest education level: Not on file  Occupational History   Occupation: disability  Tobacco Use   Smoking status: Former    Packs/day: 25.00    Years: 1.00    Pack years: 25.00    Types: Cigarettes    Quit date: 08/27/1998    Years since quitting: 22.6   Smokeless tobacco: Never   Tobacco comments:    quit in 2000 Verified 01/28/21 MR   Vaping Use   Vaping Use: Never used  Substance and Sexual Activity   Alcohol use: No    Alcohol/week: 0.0 standard drinks   Drug use: No   Sexual activity: Yes    Birth control/protection: Surgical  Other Topics Concern   Not on file   Social History Narrative   Not on file   Social Determinants of Health   Financial Resource Strain: Low Risk    Difficulty of Paying Living Expenses: Not hard at all  Food Insecurity: No Food Insecurity   Worried About Charity fundraiser in the Last Year: Never true   [place] in the Last Year: Never true  Transportation Needs: No Transportation Needs   Lack of Transportation (Medical): No   Lack of Transportation (Non-Medical): No  Physical Activity: Insufficiently Active   Days of Exercise per Week: 7 days   Minutes of Exercise per Session: 10 min  Stress: No Stress Concern Present   Feeling of Stress : Not at all  Social Connections: Socially Isolated   Frequency of Communication with Friends and Family: More than three times a week   Frequency of  Social Gatherings with Friends and Family: Once a week   Attends Religious Services: Never   Marine scientist or Organizations: No   Attends Archivist Meetings: Never   Marital Status: Widowed   Family History  Problem Relation Age of Onset   Bone cancer Sister    Pancreatic cancer Sister    Pneumonia Mother    Heart attack Father    Diabetes Sister    Dementia Sister    Colon cancer Neg Hx    Scheduled Meds:  arformoterol  15 mcg Nebulization BID   And   umeclidinium bromide  1 puff Inhalation Daily   Chlorhexidine Gluconate Cloth  6 each Topical Daily   heparin  5,000 Units Subcutaneous Q8H   levothyroxine  75 mcg Oral Daily   pantoprazole  40 mg Oral Daily   Continuous Infusions:  sodium chloride     ceFEPime (MAXIPIME) IV Stopped (03/28/21 1020)   lactated ringers 100 mL/hr at 03/28/21 1527   PRN Meds:.acetaminophen **OR** acetaminophen, albuterol, ALPRAZolam, fentaNYL (SUBLIMAZE) injection, ondansetron **OR** ondansetron (ZOFRAN) IV Medications Prior to Admission:  Prior to Admission medications   Medication Sig Start Date End Date Taking? Authorizing Provider  albuterol (PROVENTIL)  (2.5 MG/3ML) 0.083% nebulizer solution Take 3 mLs (2.5 mg total) by nebulization every 6 (six) hours as needed for wheezing or shortness of breath. 02/22/21  Yes Emokpae, Courage, MD  albuterol (VENTOLIN HFA) 108 (90 Base) MCG/ACT inhaler Inhale 1-2 puffs into the lungs every 6 (six) hours as needed for wheezing or shortness of breath. 02/22/21  Yes Emokpae, Courage, MD  ALPRAZolam Duanne Moron) 1 MG tablet Take 1 tablet (1 mg total) by mouth every 8 (eight) hours as needed for anxiety. 01/17/21  Yes Lindell Spar, MD  levothyroxine (SYNTHROID) 75 MCG tablet TAKE ONE TABLET BY MOUTH ONCE DAILY. 03/04/21  Yes Lindell Spar, MD  magic mouthwash w/lidocaine SOLN Take 30 mLs by mouth 4 (four) times daily as needed. 03/09/21  Yes [provider]  ondansetron (ZOFRAN) 8 MG tablet Take 8 mg by mouth every 8 (eight) hours as needed for nausea/vomiting. 02/16/21  Yes [provider]  oxyCODONE (ROXICODONE) 15 MG immediate release tablet Take 1 tablet (15 mg total) by mouth every 8 (eight) hours as needed (Severe pain). 03/17/21  Yes Lindell Spar, MD  pantoprazole (PROTONIX) 40 MG tablet Take 1 tablet (40 mg total) by mouth daily. 02/22/21  Yes Emokpae, Courage, MD  Vitamin D, Ergocalciferol, (DRISDOL) 1.25 MG (50000 UNIT) CAPS capsule Take 50,000 Units by mouth every Sunday.   Yes [provider]  amLODipine (NORVASC) 5 MG tablet Take 1 tablet (5 mg total) by mouth daily. Patient not taking: No sig reported 02/28/21   Roxan Hockey, MD  guaiFENesin (MUCINEX) 600 MG 12 hr tablet Take 1 tablet (600 mg total) by mouth 2 (two) times daily. Patient not taking: No sig reported 02/22/21   Roxan Hockey, MD  promethazine-dextromethorphan (PROMETHAZINE-DM) 6.25-15 MG/5ML syrup Take 5 mLs by mouth 4 (four) times daily as needed for cough. 01/06/21   Lindell Spar, MD  Tiotropium Bromide-Olodaterol (STIOLTO RESPIMAT) 2.5-2.5 MCG/ACT AERS Inhale 2 puffs into the lungs daily. Patient not taking:  No sig reported 02/22/21   Roxan Hockey, MD   Allergies  Allergen Reactions   Penicillins Itching     Tolerated Ancef Intraop 09/13/20   Vicodin [Hydrocodone-Acetaminophen] Nausea And Vomiting   Review of Systems  Unable to perform ROS: Age   Physical Exam Vitals  and nursing note reviewed.  Constitutional:      General: She is not in acute distress.    Appearance: She is ill-appearing.     Comments: Frail and thin  HENT:     Head: Atraumatic.     Mouth/Throat:     Mouth: Mucous membranes are moist.  Cardiovascular:     Rate and Rhythm: Normal rate.  Pulmonary:     Effort: Pulmonary effort is normal. No respiratory distress.  Skin:    General: Skin is warm and dry.     Findings: Bruising present.  Neurological:     Mental Status: She is alert and oriented to person, place, and time.  Psychiatric:        Mood and Affect: Mood normal.        Behavior: Behavior normal. Behavior is cooperative.    Vital Signs: BP (!) 136/54   Pulse 70   Temp (!) 97.3 F (36.3 C) (Oral)   Resp 16   Ht _0  (1.651 m)   Wt 40.8 kg   SpO2 100%   BMI 14.98 kg/m  Pain Scale: 0-10   Pain Score: 0-No pain   SpO2: SpO2: 100 % O2 Device:SpO2: 100 % O2 Flow Rate: .O2 Flow Rate (L/min): 2 L/min  IO: Intake/output summary:  Intake/Output Summary (Last 24 hours) at 03/28/2021 1633 Last data filed at 03/28/2021 1527 Gross per 24 hour  Intake 2692.73 ml  Output --  Net 2692.73 ml    LBM: Last BM Date: 03/28/21 Baseline Weight: Weight: 40.8 kg Most recent weight: Weight: 40.8 kg     Palliative Assessment/Data:   Flowsheet Rows    Flowsheet Row Most Recent Value  Intake Tab   Referral Department Hospitalist  Unit at Time of Referral Intermediate Care Unit  Palliative Care Primary Diagnosis Other (Comment)  Date Notified 03/28/21  Palliative Care Type New Palliative care  Reason for referral Clarify Goals of Care  Date of Admission 03/22/2021  Date first seen by Palliative  Care 03/28/21  # of days Palliative referral response time 0 Day(s)  # of days IP prior to Palliative referral 1  Clinical Assessment   Palliative Performance Scale Score 50%  Pain Max last 24 hours Not able to report  Pain Min Last 24 hours Not able to report  Dyspnea Max Last 24 Hours Not able to report  Dyspnea Min Last 24 hours Not able to report  Psychosocial & Spiritual Assessment   Palliative Care Outcomes        Time In: 1500 Time Out: 1610 Time Total: 70 minutes  Greater than 50%  of this time was spent counseling and coordinating care related to the above assessment and plan.  Signed by: Drue Novel, NP   Please contact Palliative Medicine Team phone at [phone] for questions and concerns.  For individual provider: See Shea Evans

## 2021-03-28 NOTE — Progress Notes (Addendum)
Patient has been incontinent of urine and loose stool and nurse unable to obtain urine sample due to this. Dr Dyann Kief aware. Dr Dyann Kief also aware of potassium level 5.1.

## 2021-03-28 NOTE — TOC Initial Note (Signed)
Transition of Care [facility]) - Initial/Assessment Note    Patient Details  Name: Marilyn Solomon MRN: [id number] Date of Birth: 1946/02/22  Transition of Care [facility] CM/SW Contact:    Boneta Lucks, RN Phone Number: 03/28/2021, 11:00 AM  Clinical Narrative:    Patient admitted with severe sepsis. Patient lives at home with her two sons, her daughter comes back and forth daily to assist. Patient uses a walker in the home. Daughter provides transportation and patient uses a wheelchair in public. MD consulted Palliative to discuss goals of care.                 Expected Discharge Plan: [place] Barriers to Discharge: Continued Medical Work up  Patient Goals and CMS Choice Patient states their goals for this hospitalization and ongoing recovery are:: to return home. CMS Medicare.gov Compare Post Acute Care list provided to:: Patient Choice offered to / list presented to : Patient  Expected Discharge Plan and Services Expected Discharge Plan: [place]      Living arrangements for the past 2 months: Single Family Home                   Prior Living Arrangements/Services Living arrangements for the past 2 months: Single Family Home Lives with:: Adult Children Patient language and need for interpreter reviewed:: Yes        Need for Family Participation in Patient Care: Yes (Comment) Care giver support system in place?: Yes (comment) Current home services: DME Criminal Activity/Legal Involvement Pertinent to Current Situation/Hospitalization: No - Comment as needed  Activities of Daily Living Home Assistive Devices/Equipment: Dentures (specify type), Eyeglasses ADL Screening (condition at time of admission) Patient's cognitive ability adequate to safely complete daily activities?: Yes Is the patient deaf or have difficulty hearing?: Yes Does the patient have difficulty seeing, even when wearing glasses/contacts?: No Does the patient have  difficulty concentrating, remembering, or making decisions?: No Patient able to express need for assistance with ADLs?: Yes Does the patient have difficulty dressing or bathing?: No Independently performs ADLs?: Yes (appropriate for developmental age) Does the patient have difficulty walking or climbing stairs?: Yes Weakness of Legs: Both Weakness of Arms/Hands: None  Permission Sought/Granted   Emotional Assessment      Orientation: : Oriented to Self, Oriented to Place, Oriented to  Time, Oriented to Situation Alcohol / Substance Use: Not Applicable Psych Involvement: No (comment)  Admission diagnosis:  Cholecystitis [K81.9] Severe sepsis ([place]) [A41.9, R65.20] Patient Active Problem List   Diagnosis Date Noted   Pressure injury of skin 03/28/2021   Abdominal pain    Severe sepsis ([place]) 03/16/2021   COVID-19 virus infection 02/21/2021   Complete paralysis of right vocal cord 08/27/2020   Malignant neoplasm of thyroid gland ([place]) 07/08/2020   Other chronic pain 06/17/2020   Multinodular goiter 06/10/2020   Neck mass 06/03/2020   Hearing abnormally acute, right 06/03/2020   Chronic respiratory failure with hypoxia and hypercapnia ([place]) 05/11/2020   Irritable bowel syndrome with atypical cp  05/11/2020   Acute kidney injury superimposed on CKD ([place]) 01/23/2020   Hypertension 06/02/2018   Positive colorectal cancer screening using Cologuard test 08/21/2017   Normocytic anemia    Anxiety 06/30/2017   Hypothyroidism 06/30/2017   GERD (gastroesophageal reflux disease) 06/30/2017   COPD with acute exacerbation ([place]) 06/30/2017   Elevated liver enzymes 06/30/2017   Severe Iron deficiency anemia 06/30/2017   Second degree hemorrhoids    PCP:  Lindell Spar, MD Pharmacy:   [facility], [place] [address] Phone: [phone] Fax: [phone]   Readmission Risk Interventions Readmission Risk Prevention Plan  03/28/2021  Transportation Screening Complete  Medication Review ([place]) Complete  HRI or Home Care Consult Complete  SW Recovery Care/Counseling Consult Complete  Palliative Care Screening Not Complete  Skilled [place] Not Complete  Some recent data might be hidden

## 2021-03-28 NOTE — Consult Note (Signed)
Reason for Consult: Possible acalculous cholecystitis, sepsis Referring Physician: Dr. Mena Marilyn Solomon is an 75 y.o. female.  HPI: Patient is a 75 year old white female who presented to the emergency room with worsening abdominal pain, nausea, vomiting, and diarrhea.  Her past medical history is significant for COPD with home oxygen requirements, papillary thyroid cancer, and recent admission for COVID-pneumonia.  Patient was found to be septic upon presentation.  A CT scan of the abdomen pelvis was performed which revealed possible dilated gallbladder.  She did also have lactic acidosis.  She was started on IV antibiotics.  This morning, she states her abdominal pain has eased.  She denies any nausea.  Her leukocytosis has resolved.  Past Medical History:  Diagnosis Date   Anemia    Anxiety    Arthritis    C. difficile diarrhea 10/08/2019   Chronic back pain    COPD (chronic obstructive pulmonary disease) (HCC)    GERD (gastroesophageal reflux disease)    HCAP (healthcare-associated pneumonia) 06/28/2017   Hypertension    Hypothyroidism    On home oxygen therapy    2L   Pneumonia due to COVID-19 virus 09/28/2019   Reflux    no medications currently, asymptomatic   Thyroid disease     Past Surgical History:  Procedure Laterality Date   ABDOMINAL HYSTERECTOMY     BIOPSY  10/08/2017   Procedure: BIOPSY;  Surgeon: Daneil Dolin, MD;  [facility];  Service: Endoscopy;;  gastric   cataracts Bilateral    COLONOSCOPY  2007   Dr. Gala Romney: normal rectum, diverticula    COLONOSCOPY WITH PROPOFOL N/A 11/15/2015   Dr. Gala Romney: grade II hemorrhoids, diverticulosis   COLONOSCOPY WITH PROPOFOL N/A 10/08/2017   Rourk: Diverticulosis   ESOPHAGOGASTRODUODENOSCOPY (EGD) WITH PROPOFOL N/A 10/08/2017   Rourk: Nonobstructing Schatzki ring, mild erosive reflux esophagitis, small hiatal hernia, gastritis but no H. pylori.   FRACTURE SURGERY Right 2005   Wrist   JOINT REPLACEMENT Left  2000   hip   RADICAL NECK DISSECTION Right 09/13/2020   Procedure: RADICAL NECK DISSECTION;  Surgeon: Melida Quitter, MD;  Location: [place];  Service: ENT;  Laterality: Right;   THYROIDECTOMY N/A 09/13/2020   Procedure: TOTAL THYROIDECTOMY;  Surgeon: Melida Quitter, MD;  Location[facility] OR;  Service: ENT;  Laterality: N/A;   TOTAL HIP ARTHROPLASTY Left    WRIST SURGERY Right     Family History  Problem Relation Age of Onset   Bone cancer Sister    Pancreatic cancer Sister    Pneumonia Mother    Heart attack Father    Diabetes Sister    Dementia Sister    Colon cancer Neg Hx     Social History:  reports that she quit smoking about 22 years ago. Her smoking use included cigarettes. She has a 25.00 pack-year smoking history. She has never used smokeless tobacco. She reports that she does not drink alcohol and does not use drugs.  Allergies:  Allergies  Allergen Reactions   Penicillins Itching     Tolerated Ancef Intraop 09/13/20   Vicodin [Hydrocodone-Acetaminophen] Nausea And Vomiting    Medications: I have reviewed the patient's current medications. Prior to Admission:  Medications Prior to Admission  Medication Sig Dispense Refill Last Dose   albuterol (PROVENTIL) (2.5 MG/3ML) 0.083% nebulizer solution Take 3 mLs (2.5 mg total) by nebulization every 6 (six) hours as needed for wheezing or shortness of breath. 120 mL 2 03/26/2021   albuterol (VENTOLIN HFA) 108 (90  Base) MCG/ACT inhaler Inhale 1-2 puffs into the lungs every 6 (six) hours as needed for wheezing or shortness of breath. 18 g 5 03/04/2021   ALPRAZolam (XANAX) 1 MG tablet Take 1 tablet (1 mg total) by mouth every 8 (eight) hours as needed for anxiety. 90 tablet 2 03/26/2021   levothyroxine (SYNTHROID) 75 MCG tablet TAKE ONE TABLET BY MOUTH ONCE DAILY. 30 tablet 0 03/26/2021   magic mouthwash w/lidocaine SOLN Take 30 mLs by mouth 4 (four) times daily as needed.   Past Week   ondansetron (ZOFRAN) 8 MG tablet Take 8 mg by mouth  every 8 (eight) hours as needed for nausea/vomiting.   unk   oxyCODONE (ROXICODONE) 15 MG immediate release tablet Take 1 tablet (15 mg total) by mouth every 8 (eight) hours as needed (Severe pain). 90 tablet 0 unk   pantoprazole (PROTONIX) 40 MG tablet Take 1 tablet (40 mg total) by mouth daily. 90 tablet 3 03/26/2021   Vitamin D, Ergocalciferol, (DRISDOL) 1.25 MG (50000 UNIT) CAPS capsule Take 50,000 Units by mouth every Sunday.   Past Week   amLODipine (NORVASC) 5 MG tablet Take 1 tablet (5 mg total) by mouth daily. (Patient not taking: No sig reported) 90 tablet 2 Not Taking   guaiFENesin (MUCINEX) 600 MG 12 hr tablet Take 1 tablet (600 mg total) by mouth 2 (two) times daily. (Patient not taking: No sig reported) 20 tablet 0 Not Taking   promethazine-dextromethorphan (PROMETHAZINE-DM) 6.25-15 MG/5ML syrup Take 5 mLs by mouth 4 (four) times daily as needed for cough. 118 mL 0 unk   Tiotropium Bromide-Olodaterol (STIOLTO RESPIMAT) 2.5-2.5 MCG/ACT AERS Inhale 2 puffs into the lungs daily. (Patient not taking: No sig reported) 1 each 11 Not Taking    Results for orders placed or performed during the hospital encounter of 03/24/2021 (from the past 48 hour(s))  Lactic acid, plasma     Status: Abnormal   Collection Time: 03/18/2021  8:58 AM  Result Value Ref Range   Lactic Acid, Venous 6.6 (HH) 0.5 - 1.9 mmol/L    Comment: CRITICAL RESULT CALLED TO, READ BACK BY AND VERIFIED WITH:  E.GANT [id number] 02/26/2021 BY STEPHTR Performed at [facility], [address], [place], [place] [address]   Comprehensive metabolic panel     Status: Abnormal   Collection Time: 03/25/2021  8:58 AM  Result Value Ref Range   Sodium 139 135 - 145 mmol/L   Potassium 5.4 (H) 3.5 - 5.1 mmol/L   Chloride 97 (L) 98 - 111 mmol/L   CO2 23 22 - 32 mmol/L   Glucose, Bld 147 (H) 70 - 99 mg/dL    Comment: Glucose reference range applies only to samples taken after fasting for at least 8 hours.   BUN 26 (H) 8 - 23 mg/dL   Creatinine, Ser  2.64 (H) 0.44 - 1.00 mg/dL   Calcium 8.5 (L) 8.9 - 10.3 mg/dL   Total Protein 6.5 6.5 - 8.1 g/dL   Albumin 2.9 (L) 3.5 - 5.0 g/dL   AST 66 (H) 15 - 41 U/L   ALT 20 0 - 44 U/L   Alkaline Phosphatase 91 38 - 126 U/L   Total Bilirubin 0.9 0.3 - 1.2 mg/dL   GFR, Estimated 18 (L) >60 mL/min    Comment: (NOTE) Calculated using the CKD-EPI Creatinine Equation (2021)    Anion gap 19 (H) 5 - 15    Comment: Performed at [facility], [address], [place], [place] [address]  CBC WITH DIFFERENTIAL  Status: Abnormal   Collection Time: 03/16/2021  8:58 AM  Result Value Ref Range   WBC 16.4 (H) 4.0 - 10.5 K/uL   RBC 4.93 3.87 - 5.11 MIL/uL   Hemoglobin 14.2 12.0 - 15.0 g/dL   HCT 45.4 36.0 - 46.0 %   MCV 92.1 80.0 - 100.0 fL   MCH 28.8 26.0 - 34.0 pg   MCHC 31.3 30.0 - 36.0 g/dL   RDW 21.4 (H) 11.5 - 15.5 %   Platelets 158 150 - 400 K/uL   nRBC 0.0 0.0 - 0.2 %   Neutrophils Relative % 75 %   Neutro Abs 12.3 (H) 1.7 - 7.7 K/uL   Lymphocytes Relative 16 %   Lymphs Abs 2.5 0.7 - 4.0 K/uL   Monocytes Relative 6 %   Monocytes Absolute 1.0 0.1 - 1.0 K/uL   Eosinophils Relative 2 %   Eosinophils Absolute 0.4 0.0 - 0.5 K/uL   Basophils Relative 0 %   Basophils Absolute 0.1 0.0 - 0.1 K/uL   WBC Morphology MORPHOLOGY UNREMARKABLE    RBC Morphology MORPHOLOGY UNREMARKABLE    Smear Review MORPHOLOGY UNREMARKABLE    Immature Granulocytes 1 %   Abs Immature Granulocytes 0.10 (H) 0.00 - 0.07 K/uL   Polychromasia PRESENT     Comment: Performed at [facility], [address] [place], [place] [address]  Protime-INR     Status: None   Collection Time: 03/11/2021  8:58 AM  Result Value Ref Range   Prothrombin Time 13.1 11.4 - 15.2 seconds   INR 1.0 0.8 - 1.2    Comment: (NOTE) INR goal varies based on device and disease states. Performed at [facility] [address], [place], [place] [address]   APTT     Status: None   Collection Time: 03/23/2021  8:58 AM  Result Value Ref Range   aPTT 25 24 -  36 seconds    Comment: Performed at [facility], [address], [place], [place] [address]  Lactic acid, plasma     Status: Abnormal   Collection Time: 03/10/2021 12:56 PM  Result Value Ref Range   Lactic Acid, Venous 4.5 (HH) 0.5 - 1.9 mmol/L    Comment: CRITICAL RESULT CALLED TO, READ BACK BY AND VERIFIED WITH: CRAWFORD,H 1317 03/05/2021 COLEMAN,R Performed at [facility], [address] [place], [place] [address]   Blood gas, venous (at [facility] and [facility], not at [facility])     Status: None   Collection Time: 03/05/2021 12:56 PM  Result Value Ref Range   FIO2 28.00    pH, Ven 7.251 7.250 - 7.430   pCO2, Ven 57.8 44.0 - 60.0 mmHg   pO2, Ven 43.1 32.0 - 45.0 mmHg   Bicarbonate 21.2 20.0 - 28.0 mmol/L   Acid-base deficit 1.6 0.0 - 2.0 mmol/L   O2 Saturation 59.6 %   Patient temperature 36.1    Sample type VENOUS     Comment: Performed at [facility], [address] [place], [place] [address]  Lactic acid, plasma     Status: Abnormal   Collection Time: 03/13/2021  3:25 PM  Result Value Ref Range   Lactic Acid, Venous 3.8 (HH) 0.5 - 1.9 mmol/L    Comment: CRITICAL RESULT CALLED TO, READ BACK BY AND VERIFIED WITH: GANTT,E 1602 03/20/2021 COLEMAN,R Performed at [facility], [address] [place],  [address]   Resp Panel by RT-PCR (Flu A&B, Covid) Nasopharyngeal Swab     Status: None   Collection Time: 03/16/2021  6:36  PM   Specimen: Nasopharyngeal Swab; Nasopharyngeal(NP) swabs in vial transport medium  Result Value Ref Range   SARS Coronavirus 2 by RT PCR NEGATIVE NEGATIVE    Comment: (NOTE) SARS-CoV-2 target nucleic acids are NOT DETECTED.  The SARS-CoV-2 RNA is generally detectable in upper respiratory specimens during the acute phase of infection. The lowest concentration of SARS-CoV-2 viral copies this assay can detect is 138 copies/mL. A negative result does not preclude SARS-Cov-2 infection and should not be used as the sole basis for treatment or other patient management  decisions. A negative result may occur with  improper specimen collection/handling, submission of specimen other than nasopharyngeal swab, presence of viral mutation(s) within the areas targeted by this assay, and inadequate number of viral copies(<138 copies/mL). A negative result must be combined with clinical observations, patient history, and epidemiological information. The expected result is Negative.  Fact Sheet for Patients:  EntrepreneurPulse.com.au  Fact Sheet for Healthcare Providers:  IncredibleEmployment.be  This test is no t yet approved or cleared by the [place] FDA and  has been authorized for detection and/or diagnosis of SARS-CoV-2 by FDA under an Emergency Use Authorization (EUA). This EUA will remain  in effect (meaning this test can be used) for the duration of the COVID-19 declaration under Section 564(b)(1) of the Act, 21 U.S.C.section 360bbb-3(b)(1), unless the authorization is terminated  or revoked sooner.       Influenza A by PCR NEGATIVE NEGATIVE   Influenza B by PCR NEGATIVE NEGATIVE    Comment: (NOTE) The Xpert Xpress SARS-CoV-2/FLU/RSV plus assay is intended as an aid in the diagnosis of influenza from Nasopharyngeal swab specimens and should not be used as a sole basis for treatment. Nasal washings and aspirates are unacceptable for Xpert Xpress SARS-CoV-2/FLU/RSV testing.  Fact Sheet for Patients: EntrepreneurPulse.com.au  Fact Sheet for Healthcare Providers: IncredibleEmployment.be  This test is not yet approved or cleared by the [place] FDA and has been authorized for detection and/or diagnosis of SARS-CoV-2 by FDA under an Emergency Use Authorization (EUA). This EUA will remain in effect (meaning this test can be used) for the duration of the COVID-19 declaration under Section 564(b)(1) of the Act, 21 U.S.C. section 360bbb-3(b)(1), unless the authorization  is terminated or revoked.  Performed at [facility], [address], [place], [place] [address]   C Difficile Quick Screen w PCR reflex     Status: None   Collection Time: 03/17/2021 10:07 PM   Specimen: STOOL  Result Value Ref Range   C Diff antigen NEGATIVE NEGATIVE   C Diff toxin NEGATIVE NEGATIVE   C Diff interpretation No C. difficile detected.     Comment: Performed at [facility], [address] [place], [place] [address]  Magnesium     Status: Abnormal   Collection Time: 03/28/21  4:38 AM  Result Value Ref Range   Magnesium 1.6 (L) 1.7 - 2.4 mg/dL    Comment: Performed at [facility], [address], [place], [place] [address]  Phosphorus     Status: Abnormal   Collection Time: 03/28/21  4:38 AM  Result Value Ref Range   Phosphorus 5.0 (H) 2.5 - 4.6 mg/dL    Comment: Performed at [facility], [address], [place], [place] [address]  Comprehensive metabolic panel     Status: Abnormal   Collection Time: 03/28/21  4:38 AM  Result Value Ref Range   Sodium 138 135 - 145 mmol/L   Potassium 5.1 3.5 - 5.1 mmol/L   Chloride 103  98 - 111 mmol/L   CO2 25 22 - 32 mmol/L   Glucose, Bld 100 (H) 70 - 99 mg/dL    Comment: Glucose reference range applies only to samples taken after fasting for at least 8 hours.   BUN 27 (H) 8 - 23 mg/dL   Creatinine, Ser 1.85 (H) 0.44 - 1.00 mg/dL   Calcium 7.0 (L) 8.9 - 10.3 mg/dL   Total Protein 4.9 (L) 6.5 - 8.1 g/dL   Albumin 2.1 (L) 3.5 - 5.0 g/dL   AST 47 (H) 15 - 41 U/L   ALT 16 0 - 44 U/L   Alkaline Phosphatase 65 38 - 126 U/L   Total Bilirubin 0.9 0.3 - 1.2 mg/dL   GFR, Estimated 28 (L) >60 mL/min    Comment: (NOTE) Calculated using the CKD-EPI Creatinine Equation (2021)    Anion gap 10 5 - 15    Comment: Performed at [facility], [address], [place], [place] [address]  CBC     Status: Abnormal   Collection Time: 03/28/21  4:38 AM  Result Value Ref Range   WBC 9.0 4.0 - 10.5 K/uL   RBC 3.83 (L) 3.87 - 5.11 MIL/uL   Hemoglobin  11.1 (L) 12.0 - 15.0 g/dL   HCT 35.6 (L) 36.0 - 46.0 %   MCV 93.0 80.0 - 100.0 fL   MCH 29.0 26.0 - 34.0 pg   MCHC 31.2 30.0 - 36.0 g/dL   RDW 20.9 (H) 11.5 - 15.5 %   Platelets 97 (L) 150 - 400 K/uL    Comment: Immature Platelet Fraction may be clinically indicated, consider ordering this additional test [id number]    nRBC 0.0 0.0 - 0.2 %    Comment: Performed at [facility], [address] [place], [place] [address]  MRSA Next Gen by PCR, Nasal     Status: None   Collection Time: 03/28/21  5:45 AM   Specimen: Nasal Mucosa; Nasal Swab  Result Value Ref Range   MRSA by PCR Next Gen NOT DETECTED NOT DETECTED    Comment: (NOTE) The GeneXpert MRSA Assay (FDA approved for NASAL specimens only), is one component of a comprehensive MRSA colonization surveillance program. It is not intended to diagnose MRSA infection nor to guide or monitor treatment for MRSA infections. Test performance is not FDA approved in patients less than 57 years old. Performed at [facility], [address] [place], [place] [address]     CT ABDOMEN PELVIS WO CONTRAST  Result Date: 03/22/2021 CLINICAL DATA:  Left upper quadrant abdominal pain and sepsis. EXAM: CT ABDOMEN AND PELVIS WITHOUT CONTRAST TECHNIQUE: Multidetector CT imaging of the abdomen and pelvis was performed following the standard protocol without IV contrast. COMPARISON:  CT scan 10/07/2019 FINDINGS: Lower chest: Emphysematous changes and pulmonary scarring. Moderate peripheral peribronchial thickening. No focal infiltrates or pleural effusions. The heart is normal in size. No pericardial effusion. Stable aortic and coronary artery calcifications. Hepatobiliary: No obvious hepatic lesions without contrast. Gallbladder is moderately distended and there may be mild gallbladder wall thickening. I do not see any definite pericholecystic inflammatory changes and no significant common bile duct dilatation. Pancreas: No mass, inflammation or ductal  dilatation. Mild pancreatic atrophy. Spleen: Normal size.  No focal lesions. Adrenals/Urinary Tract: The adrenal glands and kidneys are grossly normal. No renal or obstructing ureteral calculi. No obvious renal or bladder lesions without contrast. Stomach/Bowel: The stomach, duodenum, small bowel and colon are grossly normal without oral contrast. No obvious acute inflammatory process, mass  lesions or inflammatory changes. Sigmoid colon diverticulosis without findings for acute diverticulitis. Vascular/Lymphatic: Advanced atherosclerotic calcifications involving the aorta and branch vessels but no aneurysm. No mesenteric or retroperitoneal mass or adenopathy. Reproductive: The uterus is surgically absent. The left ovary is not visualized for certain. The right ovary is still present and contains a small stable simple cyst measuring 15 mm. Other: No pelvic mass or adenopathy. No free pelvic fluid collections. No inguinal mass or adenopathy. No abdominal wall hernia or subcutaneous lesions. Musculoskeletal: Stable artifact associated with a left hip hemiarthroplasty. Extensive lucency surrounding the femoral prosthesis with protrusio. This extends all the way down into the ischial tuberosity and is likely advanced particle disease. IMPRESSION: 1. No acute abdominal/pelvic findings, mass lesions or adenopathy. 2. Moderately distended gallbladder with possible mild gallbladder wall thickening. No definite pericholecystic inflammatory changes or common bile duct dilatation. Right upper quadrant ultrasound examination may be helpful. 3. Advanced atherosclerotic calcifications involving the aorta and branch vessels. 4. Extensive lucency surrounding the left hip hemiarthroplasty with protrusion and extensive lucency surrounding the femoral prosthesis extending all the way down into the ischial tuberosity and likely advanced particle disease. Aortic Atherosclerosis (ICD10-I70.0). Electronically Signed   By: Marijo Sanes  M.D.   On: 03/03/2021 12:28   DG Chest Port 1 View  Result Date: 02/26/2021 CLINICAL DATA:  Fever, sepsis EXAM: PORTABLE CHEST 1 VIEW COMPARISON:  Previous studies including the examination of 02/21/2021 FINDINGS: Cardiac size is within normal limits. Low position of diaphragms suggests COPD. Increased interstitial markings in both apices suggest scarring. No new focal infiltrates are seen. There is no pleural effusion or pneumothorax. IMPRESSION: COPD. There are no signs of pulmonary edema or focal pulmonary consolidation. Electronically Signed   By: Elmer Picker M.D.   On: 03/14/2021 10:07    ROS:  Pertinent items are noted in HPI.  Blood pressure 131/63, pulse 63, temperature (!) 96.1 F (35.6 C), temperature source Axillary, resp. rate 11, height 5\' 5"  (1.651 m), weight 40.8 kg, SpO2 100 %. Physical Exam: Pleasant cachectic white female no acute distress Head is normocephalic, atraumatic Eyes without scleral icterus Lungs clear to auscultation with good breath sounds bilaterally Heart examination reveals a regular rate and rhythm without S3, S4, murmurs Abdomen is soft and flat.  I could not really elicit any right upper quadrant abdominal pain.  No masses were noted. CT scan images personally reviewed.  Hospital course reviewed.  Assessment/Plan: Impression: Sepsis of unknown etiology.  Given her quick resolution of her leukocytosis and her normal LFTs, a calculus cholecystitis less likely.  Suggest ultrasound to assess the hepatobiliary tree.  Further management is pending those results.  No need for acute surgical intervention or cholecystostomy tube placement at the present time.  Discussed with Dr. Dyann Kief.  Aviva Signs 03/28/2021, 9:44 AM

## 2021-03-29 ENCOUNTER — Inpatient Hospital Stay (HOSPITAL_COMMUNITY): Payer: Medicare Other

## 2021-03-29 DIAGNOSIS — R6520 Severe sepsis without septic shock: Secondary | ICD-10-CM | POA: Diagnosis not present

## 2021-03-29 DIAGNOSIS — K819 Cholecystitis, unspecified: Secondary | ICD-10-CM | POA: Diagnosis not present

## 2021-03-29 DIAGNOSIS — L89152 Pressure ulcer of sacral region, stage 2: Secondary | ICD-10-CM | POA: Diagnosis not present

## 2021-03-29 DIAGNOSIS — A419 Sepsis, unspecified organism: Secondary | ICD-10-CM | POA: Diagnosis not present

## 2021-03-29 LAB — CBC
HCT: 32.9 % — ABNORMAL LOW (ref 36.0–46.0)
Hemoglobin: 10.4 g/dL — ABNORMAL LOW (ref 12.0–15.0)
MCH: 29.5 pg (ref 26.0–34.0)
MCHC: 31.6 g/dL (ref 30.0–36.0)
MCV: 93.2 fL (ref 80.0–100.0)
Platelets: 123 10*3/uL — ABNORMAL LOW (ref 150–400)
RBC: 3.53 MIL/uL — ABNORMAL LOW (ref 3.87–5.11)
RDW: 21.3 % — ABNORMAL HIGH (ref 11.5–15.5)
WBC: 8.3 10*3/uL (ref 4.0–10.5)
nRBC: 0 % (ref 0.0–0.2)

## 2021-03-29 LAB — URINALYSIS, ROUTINE W REFLEX MICROSCOPIC
Bilirubin Urine: NEGATIVE
Glucose, UA: NEGATIVE mg/dL
Ketones, ur: NEGATIVE mg/dL
Nitrite: NEGATIVE
Protein, ur: NEGATIVE mg/dL
Specific Gravity, Urine: 1.011 (ref 1.005–1.030)
pH: 5 (ref 5.0–8.0)

## 2021-03-29 LAB — COMPREHENSIVE METABOLIC PANEL
ALT: 17 U/L (ref 0–44)
AST: 48 U/L — ABNORMAL HIGH (ref 15–41)
Albumin: 2.1 g/dL — ABNORMAL LOW (ref 3.5–5.0)
Alkaline Phosphatase: 63 U/L (ref 38–126)
Anion gap: 12 (ref 5–15)
BUN: 19 mg/dL (ref 8–23)
CO2: 22 mmol/L (ref 22–32)
Calcium: 6.8 mg/dL — ABNORMAL LOW (ref 8.9–10.3)
Chloride: 104 mmol/L (ref 98–111)
Creatinine, Ser: 1.19 mg/dL — ABNORMAL HIGH (ref 0.44–1.00)
GFR, Estimated: 48 mL/min — ABNORMAL LOW (ref >60)
Glucose, Bld: 71 mg/dL (ref 70–99)
Potassium: 4.4 mmol/L (ref 3.5–5.1)
Sodium: 138 mmol/L (ref 135–145)
Total Bilirubin: 0.6 mg/dL (ref 0.3–1.2)
Total Protein: 4.9 g/dL — ABNORMAL LOW (ref 6.5–8.1)

## 2021-03-29 MED ORDER — CALCIUM CARBONATE ANTACID 500 MG PO CHEW
1.0000 | CHEWABLE_TABLET | Freq: Once | ORAL | Status: AC
  Administered 2021-03-29: 200 mg via ORAL
  Filled 2021-03-29: qty 1

## 2021-03-29 MED ORDER — TECHNETIUM TC 99M MEBROFENIN IV KIT
5.0000 | PACK | Freq: Once | INTRAVENOUS | Status: AC | PRN
  Administered 2021-03-29: 5.13 via INTRAVENOUS

## 2021-03-29 MED ORDER — MORPHINE SULFATE (PF) 2 MG/ML IV SOLN
1.7000 mg | Freq: Once | INTRAVENOUS | Status: AC
  Administered 2021-03-29: 1.7 mg via INTRAVENOUS
  Filled 2021-03-29: qty 1

## 2021-03-29 NOTE — Progress Notes (Signed)
Subjective: Patient states she has minimal right upper quadrant abdominal pain.  Tolerating clear liquid diet well.  Denies nausea.  Objective: Vital signs in last 24 hours: Temp:  [97.3 F (36.3 C)-97.7 F (36.5 C)] 97.4 F (36.3 C) (11/01 0701) Pulse Rate:  [62-77] 77 (11/01 0800) Resp:  [9-20] 18 (11/01 0800) BP: (103-175)/(42-84) 155/74 (11/01 0800) SpO2:  [100 %] 100 % (11/01 0800) Weight:  [43 kg] 43 kg (11/01 0500) Last BM Date: 03/28/21  Intake/Output from previous day: 10/31 0701 - 11/01 0700 In: 4082.1 [I.V.:3882.1; IV Piggyback:200] Out: 500 [Urine:500] Intake/Output this shift: No intake/output data recorded.  General appearance: alert, cooperative, and no distress GI: Abdomen soft and flat.  Minimal tenderness to deep palpation in the right upper quadrant.  No rigidity is noted.  Lab Results:  Recent Labs    03/17/2021 0858 03/28/21 0438  WBC 16.4* 9.0  HGB 14.2 11.1*  HCT 45.4 35.6*  PLT 158 97*   BMET Recent Labs    03/26/2021 0858 03/28/21 0438  NA 139 138  K 5.4* 5.1  CL 97* 103  CO2 23 25  GLUCOSE 147* 100*  BUN 26* 27*  CREATININE 2.64* 1.85*  CALCIUM 8.5* 7.0*   PT/INR Recent Labs    03/11/2021 0858  LABPROT 13.1  INR 1.0    Studies/Results: CT ABDOMEN PELVIS WO CONTRAST  Result Date: 02/26/2021 CLINICAL DATA:  Left upper quadrant abdominal pain and sepsis. EXAM: CT ABDOMEN AND PELVIS WITHOUT CONTRAST TECHNIQUE: Multidetector CT imaging of the abdomen and pelvis was performed following the standard protocol without IV contrast. COMPARISON:  CT scan 10/07/2019 FINDINGS: Lower chest: Emphysematous changes and pulmonary scarring. Moderate peripheral peribronchial thickening. No focal infiltrates or pleural effusions. The heart is normal in size. No pericardial effusion. Stable aortic and coronary artery calcifications. Hepatobiliary: No obvious hepatic lesions without contrast. Gallbladder is moderately distended and there may be mild  gallbladder wall thickening. I do not see any definite pericholecystic inflammatory changes and no significant common bile duct dilatation. Pancreas: No mass, inflammation or ductal dilatation. Mild pancreatic atrophy. Spleen: Normal size.  No focal lesions. Adrenals/Urinary Tract: The adrenal glands and kidneys are grossly normal. No renal or obstructing ureteral calculi. No obvious renal or bladder lesions without contrast. Stomach/Bowel: The stomach, duodenum, small bowel and colon are grossly normal without oral contrast. No obvious acute inflammatory process, mass lesions or inflammatory changes. Sigmoid colon diverticulosis without findings for acute diverticulitis. Vascular/Lymphatic: Advanced atherosclerotic calcifications involving the aorta and branch vessels but no aneurysm. No mesenteric or retroperitoneal mass or adenopathy. Reproductive: The uterus is surgically absent. The left ovary is not visualized for certain. The right ovary is still present and contains a small stable simple cyst measuring 15 mm. Other: No pelvic mass or adenopathy. No free pelvic fluid collections. No inguinal mass or adenopathy. No abdominal wall hernia or subcutaneous lesions. Musculoskeletal: Stable artifact associated with a left hip hemiarthroplasty. Extensive lucency surrounding the femoral prosthesis with protrusio. This extends all the way down into the ischial tuberosity and is likely advanced particle disease. IMPRESSION: 1. No acute abdominal/pelvic findings, mass lesions or adenopathy. 2. Moderately distended gallbladder with possible mild gallbladder wall thickening. No definite pericholecystic inflammatory changes or common bile duct dilatation. Right upper quadrant ultrasound examination may be helpful. 3. Advanced atherosclerotic calcifications involving the aorta and branch vessels. 4. Extensive lucency surrounding the left hip hemiarthroplasty with protrusion and extensive lucency surrounding the femoral  prosthesis extending all the way down into the ischial tuberosity  and likely advanced particle disease. Aortic Atherosclerosis (ICD10-I70.0). Electronically Signed   By: Marijo Sanes M.D.   On: 03/10/2021 12:28   DG Chest Port 1 View  Result Date: 03/23/2021 CLINICAL DATA:  Fever, sepsis EXAM: PORTABLE CHEST 1 VIEW COMPARISON:  Previous studies including the examination of 02/21/2021 FINDINGS: Cardiac size is within normal limits. Low position of diaphragms suggests COPD. Increased interstitial markings in both apices suggest scarring. No new focal infiltrates are seen. There is no pleural effusion or pneumothorax. IMPRESSION: COPD. There are no signs of pulmonary edema or focal pulmonary consolidation. Electronically Signed   By: Elmer Picker M.D.   On: 03/08/2021 10:07   US Abdomen Limited RUQ (LIVER/GB)  Result Date: 03/28/2021 CLINICAL DATA:  Possible gallbladder wall thickening on CT. EXAM: ULTRASOUND ABDOMEN LIMITED RIGHT UPPER QUADRANT COMPARISON:  CT abdomen pelvis from yesterday. FINDINGS: Gallbladder: Layering sludge with mild wall thickening visualized. No sonographic Murphy sign noted by sonographer. Trace pericholecystic fluid. Common bile duct: Diameter: 3 mm, normal. Liver: No focal lesion identified. Within normal limits in parenchymal echogenicity. Portal vein is patent on color Doppler imaging with normal direction of blood flow towards the liver. Other: None. IMPRESSION: 1. Gallbladder sludge with mild wall thickening and trace pericholecystic fluid. No sonographic Murphy sign. Findings are equivocal for acute acalculous cholecystitis. Consider further evaluation with HIDA scan. Electronically Signed   By: Titus Dubin M.D.   On: 03/28/2021 12:56    Anti-infectives: Anti-infectives (From admission, onward)    Start     Dose/Rate Route Frequency Ordered Stop   03/28/21 1000  ceFEPIme (MAXIPIME) 1 g in sodium chloride 0.9 % 100 mL IVPB  Status:  Discontinued        1  g 200 mL/hr over 30 Minutes Intravenous Every 24 hours 03/13/2021 1509 03/28/21 0740   03/28/21 1000  ceFEPIme (MAXIPIME) 2 g in sodium chloride 0.9 % 100 mL IVPB        2 g 200 mL/hr over 30 Minutes Intravenous Every 24 hours 03/28/21 0740     03/28/21 0830  ceFEPIme (MAXIPIME) 2 g in sodium chloride 0.9 % 100 mL IVPB  Status:  Discontinued        2 g 200 mL/hr over 30 Minutes Intravenous Every 24 hours 03/28/21 0740 03/28/21 0740   03/05/2021 1500  ceFEPIme (MAXIPIME) 2 g in sodium chloride 0.9 % 100 mL IVPB  Status:  Discontinued        2 g 200 mL/hr over 30 Minutes Intravenous Every 12 hours 03/10/2021 1459 03/18/2021 1509   03/04/2021 1215  vancomycin variable dose per unstable renal function (pharmacist dosing)  Status:  Discontinued         Does not apply See admin instructions 03/21/2021 1215 03/28/21 0853   03/05/2021 0945  vancomycin (VANCOCIN) IVPB 1000 mg/200 mL premix        1,000 mg 200 mL/hr over 60 Minutes Intravenous  Once 02/27/2021 0944 03/26/2021 1313   03/25/2021 0930  ceFEPIme (MAXIPIME) 2 g in sodium chloride 0.9 % 100 mL IVPB        2 g 200 mL/hr over 30 Minutes Intravenous  Once 03/28/2021 3817 03/08/2021 1110       Assessment/Plan: Impression: Sepsis which now seems to have resolved.  Work-up of her gallbladder has been unremarkable up to this point.  Patient to get HIDA scan this morning to assess for acalculous cholecystitis.  Have ordered c-Met and CBC for today.  Further management is pending those results.  LOS:  2 days    Aviva Signs 03/29/2021

## 2021-03-29 NOTE — Progress Notes (Signed)
Palliative: Chart review completed.  Surgeon notes reviewed.  Time for outcomes.  PMT to follow.  Conference with attending related to patient condition, needs, disposition.  Plan: At this point continue to treat the treatable.  Agreeable to outpatient palliative services with hospice of [facility].  No charge Quinn Axe, NP Palliative medicine team Team phone [phone] Greater than 50% of this time was spent counseling and coordinating care related to the above assessment and plan.

## 2021-03-29 NOTE — Progress Notes (Signed)
HIDA scan results reviewed.  Clinically, patient has quickly recovered from her septic episode.  She has been fasting since her admission.  Her LFTs are for the most part unremarkable's except for a slightly elevated SGOT.  We will proceed with a heart healthy diet and see how she progresses.  Should she fail, would proceed with a cholecystectomy.

## 2021-03-29 NOTE — Progress Notes (Addendum)
PROGRESS NOTE    Marilyn Solomon  ZOX:[id number] DOB: 31-Dec-1945 DOA: 03/08/2021 PCP: Lindell Spar, MD    Chief Complaint  Patient presents with   Emesis    Brief Narrative:  Marilyn Solomon is a 75 y.o. female with medical history significant of thyroid disease, gastroesophageal reflux disease, hypertension, COPD, chronic respiratory failure with hypoxia (using 2 L nasal cannula supplementation at baseline), papillary thyroid cancer stage 4b and a recent admission approximately a month ago secondary to COVID-pneumonia; who presented to the hospital secondary to abdominal pain, nausea, vomiting and diarrhea; no overt bleeding appreciated.  Abdominal pain described as dull/crampy in nature, 7-8/10 in intensity, associated with nausea/vomiting and no aggravating factors.  Her symptoms have been present for the last 2-3 days and worsening.  She reports decreased oral intake.  Patient also reports chills but never measured temperature at home.  No chest pain, no focal weaknesses, no dysuria or hematuria, no other complaints.   Patient vaccinated against COVID; no booster.   ED Course: Severe SIRS/Sepsis criteria meta at time of admission with temperature of 96.6, respiratory rate of 27, soft blood pressure with a systolic blood pressure in the 80s to 90's; elevated WBCs at 16.4 and a lactic acid of 6.6; source of infection appears to be a calculus cholecystitis and organ dysfunction acute on chronic renal failure.  Fluid resuscitation following sepsis protocol provided with good improvement in patient's blood pressure and decreasing her lactic acid to 4.5.  Cultures were taken and patient has been started on broad-spectrum antibiotics.  Assessment & Plan: 1-severe sepsis: -patient met severe sepsis criteria at time of admission with elevated resp rate, soft BP, elevated WBC's, hypothermia, presume source of infection acalculous cholecystitis and renal failure for organ dysfunction. -Right  upper quadrant ultrasound demonstrating sludge and mild wall thickening; no Murphy sign. -HIDA scan positive for gallbladder dysfunction; but false positive results can be appreciated in patient's with prolonged fasting. --Following general surgery recommendations, will advance diet and assess clinical response.  Continue supportive care and IV antibiotics. -lactic acid and sepsis features significantly improved. -MRSA PCR is neg, vancomycin will be discontinued.  2-acute on chronic renal failure; stage 4 at baseline -in the setting of pre-renal azotemia and dehydration -continue to maintain adequate oral hydration now that diet will be advanced; wean off IV fluids. -avoid nephrotoxic agents and contrast -follow renal function trend  -Cr level improved and essentially back to baseline.  3-chronic resp failure with hypoxia due to COPD -continue home baseline O2 supplementation -continue bronchodilators -no wheezing or SOB appreciated.  4-GERD -continue PPI  5-nausea/Vomiting/Diarrhea -in the setting of problem #1 -continue supportive care, antiemetics and analgesics -C. Diff neg -Provide as needed loperamide.  6-HTN -stable currently -will hold antihypertensive for now and follow VS  7-hypothyroidism -continue synthroid   8-papillary thyroid cancer -holding chemotherapy currently in the setting of infection -continue outpatient follow up with oncology  9-severe protein calorie malnutrition  -Body mass index is 15.78 kg/m. -feeding supplements and nutritional service will be pursuit.  10-stage II pressure injury -localized in his sacrum, POA -no signs of superimposed infection -continue barrier therapy and constant repositioning  11-very mild hypomagnesemia -repleted -will follow Mg level.  DVT prophylaxis: heparin. Code Status: Full code. Family Communication: no family at bedside; daughter updated over the phone. Disposition:   Status is: Inpatient.  Hopefully  home in the next 24-48 hours if not surgical intervention needed.   Consultants:  General surgery . Palliative care.  Procedures:  See below for x-ray reports.   Antimicrobials:  Vancomycin and cefepime    Subjective: Frail, chronically ill in appearance and in no distress.  Currently afebrile, reporting no nausea, no vomiting and expressing being very hungry.  Objective: Vitals:   03/29/21 1130 03/29/21 1200 03/29/21 1221 03/29/21 1223  BP:  137/64  (!) 111/53  Pulse: 78 73 70 72  Resp: _0 Temp:      TempSrc:      SpO2: 100% 100% 100% 100%  Weight:      Height:        Intake/Output Summary (Last 24 hours) at 03/29/2021 1319 Last data filed at 03/29/2021 0524 Gross per 24 hour  Intake 4082.1 ml  Output 500 ml  Net 3582.1 ml   Filed Weights   02/28/2021 0839 03/29/21 0500  Weight: 40.8 kg 43 kg    Examination: General exam: Alert, awake, oriented x 3; no chest pain, no nausea, no vomiting.  Reports very little abdominal discomfort and expressed to be very hungry.  2 loose stools episodes reported overnight. Respiratory system: Good air movement bilaterally, no using accessory muscle.  No wheezing or crackles on exam.  Good saturation on chronic supplementation. Cardiovascular system: RRR, no rubs, no gallops, no JVD. Gastrointestinal system: Abdomen is soft, nontender, nondistended, positive bowel sounds. Central nervous system: Alert and oriented. No focal neurological deficits. Extremities: No cyanosis or clubbing. Skin: No no petechiae; stage II pressure injury appreciated in her sacral area without signs of superimposed infection.  Present on admission. Psychiatry: Stable mood.  Data Reviewed: I have personally reviewed following labs and imaging studies  CBC: Recent Labs  Lab 03/13/2021 0858 03/28/21 0438  WBC 16.4* 9.0  NEUTROABS 12.3*  --   HGB 14.2 11.1*  HCT 45.4 35.6*  MCV 92.1 93.0  PLT 158 97*    Basic Metabolic Panel: Recent Labs   Lab 02/27/2021 0858 03/28/21 0438 03/29/21 0929  NA 139 138 138  K 5.4* 5.1 4.4  CL 97* 103 104  CO2 _1 GLUCOSE 147* 100* 71  BUN 26* 27* 19  CREATININE 2.64* 1.85* 1.19*  CALCIUM 8.5* 7.0* 6.8*  MG  --  1.6*  --   PHOS  --  5.0*  --     GFR: Estimated Creatinine Clearance: 28.2 mL/min (A) (by C-G formula based on SCr of 1.19 mg/dL (H)).  Liver Function Tests: Recent Labs  Lab 03/16/2021 0858 03/28/21 0438 03/29/21 0929  AST 66* 47* 48*  ALT _2 ALKPHOS 91 65 63  BILITOT 0.9 0.9 0.6  PROT 6.5 4.9* 4.9*  ALBUMIN 2.9* 2.1* 2.1*    CBG: No results for input(s): GLUCAP in the last 168 hours.  Recent Results (from the past 240 hour(s))  Blood culture (routine single)     Status: None (Preliminary result)   Collection Time: 03/01/2021  8:59 AM   Specimen: BLOOD  Result Value Ref Range Status   Specimen Description BLOOD  Final   Special Requests NONE  Final   Culture   Final    NO GROWTH 2 DAYS Performed at [facility], [address], [place], [place] [address]    Report Status PENDING  Incomplete  Resp Panel by RT-PCR (Flu A&B, Covid) Nasopharyngeal Swab     Status: None   Collection Time: 03/08/2021  6:36 PM   Specimen: Nasopharyngeal Swab; Nasopharyngeal(NP) swabs in vial transport medium  Result Value Ref Range Status   SARS Coronavirus 2  by RT PCR NEGATIVE NEGATIVE Final    Comment: (NOTE) SARS-CoV-2 target nucleic acids are NOT DETECTED.  The SARS-CoV-2 RNA is generally detectable in upper respiratory specimens during the acute phase of infection. The lowest concentration of SARS-CoV-2 viral copies this assay can detect is 138 copies/mL. A negative result does not preclude SARS-Cov-2 infection and should not be used as the sole basis for treatment or other patient management decisions. A negative result may occur with  improper specimen collection/handling, submission of specimen other than nasopharyngeal swab, presence of viral mutation(s)  within the areas targeted by this assay, and inadequate number of viral copies(<138 copies/mL). A negative result must be combined with clinical observations, patient history, and epidemiological information. The expected result is Negative.  Fact Sheet for Patients:  EntrepreneurPulse.com.au  Fact Sheet for Healthcare Providers:  IncredibleEmployment.be  This test is no t yet approved or cleared by the [place] FDA and  has been authorized for detection and/or diagnosis of SARS-CoV-2 by FDA under an Emergency Use Authorization (EUA). This EUA will remain  in effect (meaning this test can be used) for the duration of the COVID-19 declaration under Section 564(b)(1) of the Act, 21 U.S.C.section 360bbb-3(b)(1), unless the authorization is terminated  or revoked sooner.       Influenza A by PCR NEGATIVE NEGATIVE Final   Influenza B by PCR NEGATIVE NEGATIVE Final    Comment: (NOTE) The Xpert Xpress SARS-CoV-2/FLU/RSV plus assay is intended as an aid in the diagnosis of influenza from Nasopharyngeal swab specimens and should not be used as a sole basis for treatment. Nasal washings and aspirates are unacceptable for Xpert Xpress SARS-CoV-2/FLU/RSV testing.  Fact Sheet for Patients: EntrepreneurPulse.com.au  Fact Sheet for Healthcare Providers: IncredibleEmployment.be  This test is not yet approved or cleared by the [place] FDA and has been authorized for detection and/or diagnosis of SARS-CoV-2 by FDA under an Emergency Use Authorization (EUA). This EUA will remain in effect (meaning this test can be used) for the duration of the COVID-19 declaration under Section 564(b)(1) of the Act, 21 U.S.C. section 360bbb-3(b)(1), unless the authorization is terminated or revoked.  Performed at [facility], [address], [place], [place] [address]   C Difficile Quick Screen w PCR reflex     Status: None    Collection Time: 03/11/2021 10:07 PM   Specimen: STOOL  Result Value Ref Range Status   C Diff antigen NEGATIVE NEGATIVE Final   C Diff toxin NEGATIVE NEGATIVE Final   C Diff interpretation No C. difficile detected.  Final    Comment: Performed at [facility], [address], [place], [place] [address]  MRSA Next Gen by PCR, Nasal     Status: None   Collection Time: 03/28/21  5:45 AM   Specimen: Nasal Mucosa; Nasal Swab  Result Value Ref Range Status   MRSA by PCR Next Gen NOT DETECTED NOT DETECTED Final    Comment: (NOTE) The GeneXpert MRSA Assay (FDA approved for NASAL specimens only), is one component of a comprehensive MRSA colonization surveillance program. It is not intended to diagnose MRSA infection nor to guide or monitor treatment for MRSA infections. Test performance is not FDA approved in patients less than 74 years old. Performed at [facility], [address], [place], [place] [address]      Radiology Studies: NM Hepato W/EF  Result Date: 03/29/2021 CLINICAL DATA:  Abdominal pain and nausea for few days, cholecystitis EXAM: NUCLEAR MEDICINE HEPATOBILIARY IMAGING TECHNIQUE: Sequential images of the abdomen  were obtained out to 60 minutes following intravenous administration of radiopharmaceutical. RADIOPHARMACEUTICALS:  5.13 mCi Tc-34m Choletec IV COMPARISON:  CT abdomen and pelvis 03/19/2021, ultrasound abdomen RIGHT upper quadrant 03/28/2021 FINDINGS: Normal tracer clearance from bloodstream. Delayed concentration and excretion of tracer into small bowel. Small bowel visualized at 55 minutes. At 1 hour, gallbladder had not visualized. Patient then received 1.7 mg of morphine IV and imaging was continued for 30 minutes. Gallbladder failed to visualize following morphine administration. Findings are consistent with cystic duct obstruction and acute cholecystitis. IMPRESSION: Nonvisualization of the gallbladder despite morphine administration consistent with acute  cholecystitis. Please note that false-positive exams can occur in patients with prolonged fasting. Electronically Signed   By: MLavonia DanaM.D.   On: 03/29/2021 12:51   UKoreaAbdomen Limited RUQ (LIVER/GB)  Result Date: 03/28/2021 CLINICAL DATA:  Possible gallbladder wall thickening on CT. EXAM: ULTRASOUND ABDOMEN LIMITED RIGHT UPPER QUADRANT COMPARISON:  CT abdomen pelvis from yesterday. FINDINGS: Gallbladder: Layering sludge with mild wall thickening visualized. No sonographic Murphy sign noted by sonographer. Trace pericholecystic fluid. Common bile duct: Diameter: 3 mm, normal. Liver: No focal lesion identified. Within normal limits in parenchymal echogenicity. Portal vein is patent on color Doppler imaging with normal direction of blood flow towards the liver. Other: None. IMPRESSION: 1. Gallbladder sludge with mild wall thickening and trace pericholecystic fluid. No sonographic Murphy sign. Findings are equivocal for acute acalculous cholecystitis. Consider further evaluation with HIDA scan. Electronically Signed   By: WTitus DubinM.D.   On: 03/28/2021 12:56     Scheduled Meds:  arformoterol  15 mcg Nebulization BID   And   umeclidinium bromide  1 puff Inhalation Daily   Chlorhexidine Gluconate Cloth  6 each Topical Daily   heparin  5,000 Units Subcutaneous Q8H   levothyroxine  75 mcg Oral Daily   pantoprazole  40 mg Oral Daily   Continuous Infusions:  sodium chloride     ceFEPime (MAXIPIME) IV 2 g (03/29/21 1257)   lactated ringers 100 mL/hr at 03/29/21 1256     LOS: 2 days    Time spent: 35 minutes   CBarton Dubois MD [facility]   To contact the attending provider between 7A-7P or the covering provider during after hours 7P-7A, please log into the web site [email or web address] and access using universal [place] password for that web site. If you do not have the password, please call the hospital operator.  03/29/2021, 1:19 PM

## 2021-03-29 DEATH — deceased

## 2021-03-30 ENCOUNTER — Inpatient Hospital Stay (HOSPITAL_COMMUNITY): Payer: Medicare Other

## 2021-03-30 ENCOUNTER — Inpatient Hospital Stay: Payer: Self-pay

## 2021-03-30 DIAGNOSIS — A419 Sepsis, unspecified organism: Secondary | ICD-10-CM | POA: Diagnosis not present

## 2021-03-30 DIAGNOSIS — R6520 Severe sepsis without septic shock: Secondary | ICD-10-CM | POA: Diagnosis not present

## 2021-03-30 DIAGNOSIS — R1011 Right upper quadrant pain: Secondary | ICD-10-CM

## 2021-03-30 DIAGNOSIS — J9621 Acute and chronic respiratory failure with hypoxia: Secondary | ICD-10-CM | POA: Diagnosis not present

## 2021-03-30 LAB — LACTIC ACID, PLASMA
Lactic Acid, Venous: 1.8 mmol/L (ref 0.5–1.9)
Lactic Acid, Venous: 1.2 mmol/L (ref 0.5–1.9)

## 2021-03-30 LAB — URINE CULTURE: Culture: NO GROWTH

## 2021-03-30 LAB — PROCALCITONIN: Procalcitonin: 20.13 ng/mL

## 2021-03-30 LAB — MRSA NEXT GEN BY PCR, NASAL: MRSA by PCR Next Gen: NOT DETECTED

## 2021-03-30 LAB — CORTISOL-AM, BLOOD: Cortisol - AM: 32.5 ug/dL — ABNORMAL HIGH (ref 6.7–22.6)

## 2021-03-30 MED ORDER — NOREPINEPHRINE 4 MG/250ML-% IV SOLN: 0.0000 ug/min | INTRAVENOUS | Status: DC

## 2021-03-30 MED ORDER — SODIUM CHLORIDE 0.9 % IV SOLN: 250.0000 mL | INTRAVENOUS | Status: DC

## 2021-03-30 MED ORDER — METHYLPREDNISOLONE SODIUM SUCC 125 MG IJ SOLR
60.0000 mg | Freq: Two times a day (BID) | INTRAMUSCULAR | Status: DC
Start: 2021-03-30 — End: 2021-03-30

## 2021-03-30 MED ORDER — ORAL CARE MOUTH RINSE
15.0000 mL | Freq: Two times a day (BID) | OROMUCOSAL | Status: DC
  Administered 2021-03-30: 15 mL via OROMUCOSAL

## 2021-03-30 MED ORDER — MIDODRINE HCL 5 MG PO TABS
5.0000 mg | ORAL_TABLET | Freq: Three times a day (TID) | ORAL | Status: DC
  Administered 2021-03-30: 5 mg via ORAL
  Filled 2021-03-30: qty 1

## 2021-03-30 MED ORDER — LACTATED RINGERS IV BOLUS
500.0000 mL | Freq: Once | INTRAVENOUS | Status: AC
  Administered 2021-03-30: 500 mL via INTRAVENOUS

## 2021-03-30 MED ORDER — METHYLPREDNISOLONE SODIUM SUCC 125 MG IJ SOLR
60.0000 mg | Freq: Two times a day (BID) | INTRAMUSCULAR | Status: DC
  Administered 2021-03-30 – 2021-04-01 (×5): 60 mg via INTRAVENOUS
  Filled 2021-03-30 (×5): qty 2

## 2021-03-30 MED ORDER — NOREPINEPHRINE 4 MG/250ML-% IV SOLN
2.0000 ug/min | INTRAVENOUS | Status: DC
  Administered 2021-03-30: 2 ug/min via INTRAVENOUS
  Administered 2021-03-31: 3 ug/min via INTRAVENOUS
  Filled 2021-03-30 (×4): qty 250

## 2021-03-30 MED ORDER — SODIUM CHLORIDE 0.9 % IV SOLN: 1.0000 g | Freq: Three times a day (TID) | INTRAVENOUS | Status: DC

## 2021-03-30 MED ORDER — SODIUM CHLORIDE 0.9 % IV BOLUS: 500.0000 mL | Freq: Once | INTRAVENOUS | Status: DC

## 2021-03-30 MED ORDER — MIDODRINE HCL 5 MG PO TABS: 5.0000 mg | ORAL_TABLET | Freq: Three times a day (TID) | ORAL | Status: DC

## 2021-03-30 MED ORDER — MEROPENEM 1 G IV SOLR
1.0000 g | Freq: Two times a day (BID) | INTRAVENOUS | Status: DC
  Administered 2021-03-30 – 2021-04-01 (×5): 1 g via INTRAVENOUS
  Filled 2021-03-30 (×5): qty 1

## 2021-03-30 NOTE — Progress Notes (Signed)
PROGRESS NOTE  Marilyn Solomon UEK:[id number] DOB: 1945/12/30 DOA: 03/06/2021 PCP: Lindell Spar, MD  Brief History:  75 y.o. female with medical history significant of thyroid disease, gastroesophageal reflux disease, hypertension, COPD, chronic respiratory failure with hypoxia (using 2 L nasal cannula supplementation at baseline), papillary thyroid cancer stage 4b and a recent admission approximately a month ago secondary to COVID-pneumonia; who presented to the hospital secondary to abdominal pain, nausea, vomiting and diarrhea; no overt bleeding appreciated.  Abdominal pain described as dull/crampy in nature, 7-8/10 in intensity, associated with nausea/vomiting and no aggravating factors.  Her symptoms have been present for the last 2-3 days and worsening.  She reports decreased oral intake.  Patient also reports chills but never measured temperature at home.  No chest pain, no focal weaknesses, no dysuria or hematuria, no other complaints.   Patient vaccinated against COVID; no booster.   ED Course: Severe SIRS/Sepsis criteria meta at time of admission with temperature of 96.6, respiratory rate of 27, soft blood pressure with a systolic blood pressure in the 80s to 90's; elevated WBCs at 16.4 and a lactic acid of 6.6; source of infection appears to be a calculus cholecystitis and organ dysfunction acute on chronic renal failure.  Fluid resuscitation following sepsis protocol provided with good improvement in patient's blood pressure and decreasing her lactic acid to 4.5.  Cultures were taken and patient has been started on broad-spectrum antibiotics.  Assessment/Plan: severe sepsis: -patient met severe sepsis criteria at time of admission with elevated resp rate, soft BP, elevated WBC's, hypothermia, presume source of infection acalculous cholecystitis and renal failure for organ dysfunction. -Right upper quadrant ultrasound demonstrating sludge and mild wall thickening; no Murphy  sign. -HIDA scan positive for gallbladder dysfunction; but false positive results can be appreciated in patient's with prolonged fasting. --Following general surgery recommendations, will advance diet and assess clinical response.  Continue supportive care and IV antibiotics. -11/2 AM--hypotension-->move to stepdown, start levophed -11/2--repeat blood culture, obtain UA, culture, am cortisol -11/2--check lactate, PCT -obtain CXR -discussed with Dr. Arnoldo Morale   acute kidney injury -in the setting of pre-renal azotemia and hypotension -continue to maintain adequate oral hydration now that diet will be advanced; wean off IV fluids. -baseline creatinine 0.5-0.8 -follow renal function trend  -Cr level improved and essentially back to baseline.   chronic resp failure with hypoxia due to COPD -continue home baseline O2 supplementation--2L -continue bronchodilators -patient has mild wheeze noted 11/2   GERD -continue PPI   nausea/Vomiting/Diarrhea -in the setting of problem #1 -continue supportive care, antiemetics and analgesics -C. Diff neg -Provide as needed loperamide. -resolved   HTN -holding amlodipine due to hypotension   hypothyroidism -continue synthroid    papillary thyroid cancer -holding chemotherapy currently in the setting of infection -continue outpatient follow up with oncology   severe protein calorie malnutrition  -Body mass index is 15.78 kg/m. -feeding supplements and nutritional service will be pursuit.   stage II pressure injury -localized in his sacrum, POA -no signs of superimposed infection -continue barrier therapy and constant repositioning   hypomagnesemia -repleted -will follow Mg level.   The patient is critically ill with multiple organ systems failure and requires high complexity decision making for assessment and support, frequent evaluation and titration of therapies, application of advanced monitoring technologies and extensive  interpretation of multiple databases.  Critical care time - 40 mins.    Status is: Inpatient  Remains inpatient appropriate because: hemodynamic instability  Family Communication:  no Family at bedside  Consultants:  general surgery  Code Status:  FULL   DVT Prophylaxis:   Heparin    Procedures: As Listed in Progress Note Above  Antibiotics: Cefepime 10/30>>       Subjective: Patient complains of nonproductive cough.  She has nausea without vomiting.  Has mild left side abd pain, no worse with food.  Complains of sob worsen than yesterday.  No diarrhea, hematochezia  Objective: Vitals:   03/30/21 0815 03/30/21 0824 03/30/21 0859 03/30/21 0900  BP: (!) 76/47 (!) 86/40    Pulse: (!) 101     Resp:      Temp:      TempSrc:      SpO2:   96% 100%  Weight:      Height:        Intake/Output Summary (Last 24 hours) at 03/30/2021 0950 Last data filed at 03/30/2021 2536 Gross per 24 hour  Intake 2271.65 ml  Output 1250 ml  Net 1021.65 ml   Weight change: 2.2 kg Exam:  General:  Pt is alert, follows commands appropriately, not in acute distress HEENT: No icterus, No thrush, No neck mass, [place]/AT Cardiovascular: RRR, S1/S2, no rubs, no gallops Respiratory: bilateral rales.  Bibasilar wheeze Abdomen: Soft/+BS, LLQ tender, non distended, no guarding Extremities: No edema, No lymphangitis, No petechiae, No rashes, no synovitis   Data Reviewed: I have personally reviewed following labs and imaging studies Basic Metabolic Panel: Recent Labs  Lab 03/26/2021 0858 03/28/21 0438 03/29/21 0929  NA 139 138 138  K 5.4* 5.1 4.4  CL 97* 103 104  CO2 $Re'23 25 22  'VqU$ GLUCOSE 147* 100* 71  BUN 26* 27* 19  CREATININE 2.64* 1.85* 1.19*  CALCIUM 8.5* 7.0* 6.8*  MG  --  1.6*  --   PHOS  --  5.0*  --    Liver Function Tests: Recent Labs  Lab 03/24/2021 0858 03/28/21 0438 03/29/21 0929  AST 66* 47* 48*  ALT $Re'20 16 17  'MdR$ ALKPHOS 91 65 63  BILITOT 0.9 0.9 0.6  PROT  6.5 4.9* 4.9*  ALBUMIN 2.9* 2.1* 2.1*   No results for input(s): LIPASE, AMYLASE in the last 168 hours. No results for input(s): AMMONIA in the last 168 hours. Coagulation Profile: Recent Labs  Lab 03/21/2021 0858  INR 1.0   CBC: Recent Labs  Lab 03/25/2021 0858 03/28/21 0438 03/29/21 1308  WBC 16.4* 9.0 8.3  NEUTROABS 12.3*  --   --   HGB 14.2 11.1* 10.4*  HCT 45.4 35.6* 32.9*  MCV 92.1 93.0 93.2  PLT 158 97* 123*   Cardiac Enzymes: No results for input(s): CKTOTAL, CKMB, CKMBINDEX, TROPONINI in the last 168 hours. BNP: Invalid input(s): POCBNP CBG: No results for input(s): GLUCAP in the last 168 hours. HbA1C: No results for input(s): HGBA1C in the last 72 hours. Urine analysis:    Component Value Date/Time   COLORURINE YELLOW 03/10/2021 0525   APPEARANCEUR CLEAR 03/15/2021 0525   LABSPEC 1.011 03/03/2021 0525   PHURINE 5.0 03/14/2021 0525   GLUCOSEU NEGATIVE 03/25/2021 0525   HGBUR SMALL (A) 03/17/2021 0525   BILIRUBINUR NEGATIVE 03/12/2021 0525   KETONESUR NEGATIVE 03/08/2021 0525   PROTEINUR NEGATIVE 03/01/2021 0525   UROBILINOGEN 0.2 03/25/2008 1702   NITRITE NEGATIVE 02/28/2021 0525   LEUKOCYTESUR MODERATE (A) 02/26/2021 0525   Sepsis Labs: $RemoveBefo'@[email or web address]'[id number]$ (procalcitonin:4,lacticidven:4) ) Recent Results (from the past 240 hour(s))  Blood culture (routine single)     Status: None (Preliminary result)  Collection Time: 03/13/2021  8:59 AM   Specimen: BLOOD  Result Value Ref Range Status   Specimen Description BLOOD  Final   Special Requests NONE  Final   Culture   Final    NO GROWTH 3 DAYS Performed at [facility], [address], [place], [place] [address]    Report Status PENDING  Incomplete  Resp Panel by RT-PCR (Flu A&B, Covid) Nasopharyngeal Swab     Status: None   Collection Time: 03/20/2021  6:36 PM   Specimen: Nasopharyngeal Swab; Nasopharyngeal(NP) swabs in vial transport medium  Result Value Ref Range Status   SARS Coronavirus 2 by RT PCR NEGATIVE  NEGATIVE Final    Comment: (NOTE) SARS-CoV-2 target nucleic acids are NOT DETECTED.  The SARS-CoV-2 RNA is generally detectable in upper respiratory specimens during the acute phase of infection. The lowest concentration of SARS-CoV-2 viral copies this assay can detect is 138 copies/mL. A negative result does not preclude SARS-Cov-2 infection and should not be used as the sole basis for treatment or other patient management decisions. A negative result may occur with  improper specimen collection/handling, submission of specimen other than nasopharyngeal swab, presence of viral mutation(s) within the areas targeted by this assay, and inadequate number of viral copies(<138 copies/mL). A negative result must be combined with clinical observations, patient history, and epidemiological information. The expected result is Negative.  Fact Sheet for Patients:  EntrepreneurPulse.com.au  Fact Sheet for Healthcare Providers:  IncredibleEmployment.be  This test is no t yet approved or cleared by the [place] FDA and  has been authorized for detection and/or diagnosis of SARS-CoV-2 by FDA under an Emergency Use Authorization (EUA). This EUA will remain  in effect (meaning this test can be used) for the duration of the COVID-19 declaration under Section 564(b)(1) of the Act, 21 U.S.C.section 360bbb-3(b)(1), unless the authorization is terminated  or revoked sooner.       Influenza A by PCR NEGATIVE NEGATIVE Final   Influenza B by PCR NEGATIVE NEGATIVE Final    Comment: (NOTE) The Xpert Xpress SARS-CoV-2/FLU/RSV plus assay is intended as an aid in the diagnosis of influenza from Nasopharyngeal swab specimens and should not be used as a sole basis for treatment. Nasal washings and aspirates are unacceptable for Xpert Xpress SARS-CoV-2/FLU/RSV testing.  Fact Sheet for Patients: EntrepreneurPulse.com.au  Fact Sheet for Healthcare  Providers: IncredibleEmployment.be  This test is not yet approved or cleared by the [place] FDA and has been authorized for detection and/or diagnosis of SARS-CoV-2 by FDA under an Emergency Use Authorization (EUA). This EUA will remain in effect (meaning this test can be used) for the duration of the COVID-19 declaration under Section 564(b)(1) of the Act, 21 U.S.C. section 360bbb-3(b)(1), unless the authorization is terminated or revoked.  Performed at [facility], [address], [place], [place] [address]   C Difficile Quick Screen w PCR reflex     Status: None   Collection Time: 03/26/2021 10:07 PM   Specimen: STOOL  Result Value Ref Range Status   C Diff antigen NEGATIVE NEGATIVE Final   C Diff toxin NEGATIVE NEGATIVE Final   C Diff interpretation No C. difficile detected.  Final    Comment: Performed at [facility], [address] [place], [place] [address]  MRSA Next Gen by PCR, Nasal     Status: None   Collection Time: 03/28/21  5:45 AM   Specimen: Nasal Mucosa; Nasal Swab  Result Value Ref Range Status   MRSA by PCR Next  Gen NOT DETECTED NOT DETECTED Final    Comment: (NOTE) The GeneXpert MRSA Assay (FDA approved for NASAL specimens only), is one component of a comprehensive MRSA colonization surveillance program. It is not intended to diagnose MRSA infection nor to guide or monitor treatment for MRSA infections. Test performance is not FDA approved in patients less than 102 years old. Performed at [facility], [address] [place], [place] [address]      Scheduled Meds:  arformoterol  15 mcg Nebulization BID   And   umeclidinium bromide  1 puff Inhalation Daily   Chlorhexidine Gluconate Cloth  6 each Topical Daily   heparin  5,000 Units Subcutaneous Q8H   levothyroxine  75 mcg Oral Daily   midodrine  5 mg Oral TID WC   pantoprazole  40 mg Oral Daily   Continuous Infusions:  sodium chloride     sodium chloride     ceFEPime (MAXIPIME)  IV Stopped (03/29/21 1328)   lactated ringers 100 mL/hr at 03/30/21 0440   norepinephrine (LEVOPHED) Adult infusion      Procedures/Studies: CT ABDOMEN PELVIS WO CONTRAST  Result Date: 03/25/2021 CLINICAL DATA:  Left upper quadrant abdominal pain and sepsis. EXAM: CT ABDOMEN AND PELVIS WITHOUT CONTRAST TECHNIQUE: Multidetector CT imaging of the abdomen and pelvis was performed following the standard protocol without IV contrast. COMPARISON:  CT scan 10/07/2019 FINDINGS: Lower chest: Emphysematous changes and pulmonary scarring. Moderate peripheral peribronchial thickening. No focal infiltrates or pleural effusions. The heart is normal in size. No pericardial effusion. Stable aortic and coronary artery calcifications. Hepatobiliary: No obvious hepatic lesions without contrast. Gallbladder is moderately distended and there may be mild gallbladder wall thickening. I do not see any definite pericholecystic inflammatory changes and no significant common bile duct dilatation. Pancreas: No mass, inflammation or ductal dilatation. Mild pancreatic atrophy. Spleen: Normal size.  No focal lesions. Adrenals/Urinary Tract: The adrenal glands and kidneys are grossly normal. No renal or obstructing ureteral calculi. No obvious renal or bladder lesions without contrast. Stomach/Bowel: The stomach, duodenum, small bowel and colon are grossly normal without oral contrast. No obvious acute inflammatory process, mass lesions or inflammatory changes. Sigmoid colon diverticulosis without findings for acute diverticulitis. Vascular/Lymphatic: Advanced atherosclerotic calcifications involving the aorta and branch vessels but no aneurysm. No mesenteric or retroperitoneal mass or adenopathy. Reproductive: The uterus is surgically absent. The left ovary is not visualized for certain. The right ovary is still present and contains a small stable simple cyst measuring 15 mm. Other: No pelvic mass or adenopathy. No free pelvic fluid  collections. No inguinal mass or adenopathy. No abdominal wall hernia or subcutaneous lesions. Musculoskeletal: Stable artifact associated with a left hip hemiarthroplasty. Extensive lucency surrounding the femoral prosthesis with protrusio. This extends all the way down into the ischial tuberosity and is likely advanced particle disease. IMPRESSION: 1. No acute abdominal/pelvic findings, mass lesions or adenopathy. 2. Moderately distended gallbladder with possible mild gallbladder wall thickening. No definite pericholecystic inflammatory changes or common bile duct dilatation. Right upper quadrant ultrasound examination may be helpful. 3. Advanced atherosclerotic calcifications involving the aorta and branch vessels. 4. Extensive lucency surrounding the left hip hemiarthroplasty with protrusion and extensive lucency surrounding the femoral prosthesis extending all the way down into the ischial tuberosity and likely advanced particle disease. Aortic Atherosclerosis (ICD10-I70.0). Electronically Signed   By: Marijo Sanes M.D.   On: 03/11/2021 12:28   NM Hepato W/EF  Result Date: 03/29/2021 CLINICAL DATA:  Abdominal pain and nausea for few days, cholecystitis EXAM:  NUCLEAR MEDICINE HEPATOBILIARY IMAGING TECHNIQUE: Sequential images of the abdomen were obtained out to 60 minutes following intravenous administration of radiopharmaceutical. RADIOPHARMACEUTICALS:  5.13 mCi Tc-7m  Choletec IV COMPARISON:  CT abdomen and pelvis 03/24/2021, ultrasound abdomen RIGHT upper quadrant 03/28/2021 FINDINGS: Normal tracer clearance from bloodstream. Delayed concentration and excretion of tracer into small bowel. Small bowel visualized at 55 minutes. At 1 hour, gallbladder had not visualized. Patient then received 1.7 mg of morphine IV and imaging was continued for 30 minutes. Gallbladder failed to visualize following morphine administration. Findings are consistent with cystic duct obstruction and acute cholecystitis.  IMPRESSION: Nonvisualization of the gallbladder despite morphine administration consistent with acute cholecystitis. Please note that false-positive exams can occur in patients with prolonged fasting. Electronically Signed   By: Lavonia Dana M.D.   On: 03/29/2021 12:51   DG Chest Port 1 View  Result Date: 03/21/2021 CLINICAL DATA:  Fever, sepsis EXAM: PORTABLE CHEST 1 VIEW COMPARISON:  Previous studies including the examination of 02/21/2021 FINDINGS: Cardiac size is within normal limits. Low position of diaphragms suggests COPD. Increased interstitial markings in both apices suggest scarring. No new focal infiltrates are seen. There is no pleural effusion or pneumothorax. IMPRESSION: COPD. There are no signs of pulmonary edema or focal pulmonary consolidation. Electronically Signed   By: Elmer Picker M.D.   On: 03/23/2021 10:07   [place] EKG SITE RITE  Result Date: 03/30/2021 If Site Rite image not attached, placement could not be confirmed due to current cardiac rhythm.  US Abdomen Limited RUQ (LIVER/GB)  Result Date: 03/28/2021 CLINICAL DATA:  Possible gallbladder wall thickening on CT. EXAM: ULTRASOUND ABDOMEN LIMITED RIGHT UPPER QUADRANT COMPARISON:  CT abdomen pelvis from yesterday. FINDINGS: Gallbladder: Layering sludge with mild wall thickening visualized. No sonographic Murphy sign noted by sonographer. Trace pericholecystic fluid. Common bile duct: Diameter: 3 mm, normal. Liver: No focal lesion identified. Within normal limits in parenchymal echogenicity. Portal vein is patent on color Doppler imaging with normal direction of blood flow towards the liver. Other: None. IMPRESSION: 1. Gallbladder sludge with mild wall thickening and trace pericholecystic fluid. No sonographic Murphy sign. Findings are equivocal for acute acalculous cholecystitis. Consider further evaluation with HIDA scan. Electronically Signed   By: Titus Dubin M.D.   On: 03/28/2021 12:56    Orson Eva, DO  [facility]  If 7PM-7AM, please contact night-coverage [email or web address] Password [email or web address] 03/30/2021, 9:50 AM   LOS: 3 days

## 2021-03-30 NOTE — Progress Notes (Signed)
Pt hypotensive, asymptomatic, 80/42, HR 87, pt denies SOB/diff breathing, O2 at 2L via [place], pt alert, orientated, and cooperative, MD notified, 560ml bolus ordered, pt started on midodrine, no signs of distress at this time, will recheck BP s/p bolus infusion and monitor closely

## 2021-03-30 NOTE — Progress Notes (Signed)
Pt c/o of slight difficulty swallowing, was able to take small sips of water with no coughing, gurgling, or distress. Pt did state she does not think she can swallow any pills at this time. Synthroid held/refused by pt

## 2021-03-30 NOTE — Progress Notes (Signed)
Subjective: Transfer back to the ICU noted.  Patient denies any abdominal pain, nausea, vomiting.  She states she has a difficult time eating some of the solid food due to mastication issues.  Objective: Vital signs in last 24 hours: Temp:  [97.3 F (36.3 C)-97.8 F (36.6 C)] 97.7 F (36.5 C) (11/02 1115) Pulse Rate:  [71-101] 89 (11/02 1245) Resp:  [16-35] 20 (11/02 1245) BP: (55-139)/(34-87) 110/49 (11/02 1245) SpO2:  [96 %-100 %] 100 % (11/02 1245) Weight:  [45.2 kg-46.3 kg] 46.3 kg (11/02 1130) Last BM Date: 03/28/21  Intake/Output from previous day: 11/01 0701 - 11/02 0700 In: 2031.7 [I.V.:1810.2; IV Piggyback:221.4] Out: 1250 [Urine:1250] Intake/Output this shift: Total I/O In: 240 [P.O.:240] Out: -   General appearance: alert, cooperative, and no distress GI: soft, non-tender; bowel sounds normal; no masses,  no organomegaly  Lab Results:  Recent Labs    03/28/21 0438 03/29/21 1308  WBC 9.0 8.3  HGB 11.1* 10.4*  HCT 35.6* 32.9*  PLT 97* 123*   BMET Recent Labs    03/28/21 0438 03/29/21 0929  NA 138 138  K 5.1 4.4  CL 103 104  CO2 25 22  GLUCOSE 100* 71  BUN 27* 19  CREATININE 1.85* 1.19*  CALCIUM 7.0* 6.8*   PT/INR No results for input(s): LABPROT, INR in the last 72 hours.  Studies/Results: NM Hepato W/EF  Result Date: 03/29/2021 CLINICAL DATA:  Abdominal pain and nausea for few days, cholecystitis EXAM: NUCLEAR MEDICINE HEPATOBILIARY IMAGING TECHNIQUE: Sequential images of the abdomen were obtained out to 60 minutes following intravenous administration of radiopharmaceutical. RADIOPHARMACEUTICALS:  5.13 mCi Tc-3m  Choletec IV COMPARISON:  CT abdomen and pelvis 03/24/2021, ultrasound abdomen RIGHT upper quadrant 03/28/2021 FINDINGS: Normal tracer clearance from bloodstream. Delayed concentration and excretion of tracer into small bowel. Small bowel visualized at 55 minutes. At 1 hour, gallbladder had not visualized. Patient then received 1.7 mg  of morphine IV and imaging was continued for 30 minutes. Gallbladder failed to visualize following morphine administration. Findings are consistent with cystic duct obstruction and acute cholecystitis. IMPRESSION: Nonvisualization of the gallbladder despite morphine administration consistent with acute cholecystitis. Please note that false-positive exams can occur in patients with prolonged fasting. Electronically Signed   By: Lavonia Dana M.D.   On: 03/29/2021 12:51   DG CHEST PORT 1 VIEW  Result Date: 03/30/2021 CLINICAL DATA:  Acute respiratory failure with hypoxia EXAM: PORTABLE CHEST 1 VIEW COMPARISON:  02/27/2021 FINDINGS: Slightly increased interstitial prominence. No new consolidation. No pleural effusion. Stable cardiomediastinal contours. IMPRESSION: Slightly increased interstitial prominence since 03/05/2021 may reflect edema or atypical/viral pneumonia. Electronically Signed   By: Macy Mis M.D.   On: 03/30/2021 12:45   [place] EKG SITE RITE  Result Date: 03/30/2021 If Site Rite image not attached, placement could not be confirmed due to current cardiac rhythm.   Anti-infectives: Anti-infectives (From admission, onward)    Start     Dose/Rate Route Frequency Ordered Stop   03/30/21 1400  meropenem (MERREM) 1 g in sodium chloride 0.9 % 100 mL IVPB  Status:  Discontinued        1 g 200 mL/hr over 30 Minutes Intravenous Every 8 hours 03/30/21 1000 03/30/21 1012   03/30/21 1200  meropenem (MERREM) 1 g in sodium chloride 0.9 % 100 mL IVPB        1 g 200 mL/hr over 30 Minutes Intravenous Every 12 hours 03/30/21 1012     03/28/21 1000  ceFEPIme (MAXIPIME) 1 g in sodium  chloride 0.9 % 100 mL IVPB  Status:  Discontinued        1 g 200 mL/hr over 30 Minutes Intravenous Every 24 hours 02/27/2021 1509 03/28/21 0740   03/28/21 1000  ceFEPIme (MAXIPIME) 2 g in sodium chloride 0.9 % 100 mL IVPB  Status:  Discontinued        2 g 200 mL/hr over 30 Minutes Intravenous Every 24 hours 03/28/21 0740  03/30/21 1000   03/28/21 0830  ceFEPIme (MAXIPIME) 2 g in sodium chloride 0.9 % 100 mL IVPB  Status:  Discontinued        2 g 200 mL/hr over 30 Minutes Intravenous Every 24 hours 03/28/21 0740 03/28/21 0740   03/15/2021 1500  ceFEPIme (MAXIPIME) 2 g in sodium chloride 0.9 % 100 mL IVPB  Status:  Discontinued        2 g 200 mL/hr over 30 Minutes Intravenous Every 12 hours 03/10/2021 1459 03/14/2021 1509   03/11/2021 1215  vancomycin variable dose per unstable renal function (pharmacist dosing)  Status:  Discontinued         Does not apply See admin instructions 03/22/2021 1215 03/28/21 0853   03/09/2021 0945  vancomycin (VANCOCIN) IVPB 1000 mg/200 mL premix        1,000 mg 200 mL/hr over 60 Minutes Intravenous  Once 03/16/2021 0944 02/27/2021 1313   02/27/2021 0930  ceFEPIme (MAXIPIME) 2 g in sodium chloride 0.9 % 100 mL IVPB        2 g 200 mL/hr over 30 Minutes Intravenous  Once 03/12/2021 1610 03/05/2021 1110       Assessment/Plan: Impression: Patient was noted to be hypotensive this morning.  She was moved back down to the stepdown unit.  She has no overt symptoms of cholecystitis.  We will continue to monitor.  Discussed with Dr. Carles Collet.  Patient is a poor candidate for surgical intervention.  LOS: 3 days    Aviva Signs 03/30/2021

## 2021-03-31 ENCOUNTER — Inpatient Hospital Stay (HOSPITAL_COMMUNITY): Payer: Medicare Other

## 2021-03-31 ENCOUNTER — Inpatient Hospital Stay: Payer: Self-pay

## 2021-03-31 DIAGNOSIS — J9622 Acute and chronic respiratory failure with hypercapnia: Secondary | ICD-10-CM

## 2021-03-31 DIAGNOSIS — Z515 Encounter for palliative care: Secondary | ICD-10-CM | POA: Diagnosis not present

## 2021-03-31 DIAGNOSIS — A419 Sepsis, unspecified organism: Secondary | ICD-10-CM | POA: Diagnosis not present

## 2021-03-31 DIAGNOSIS — D72829 Elevated white blood cell count, unspecified: Secondary | ICD-10-CM

## 2021-03-31 DIAGNOSIS — R6520 Severe sepsis without septic shock: Secondary | ICD-10-CM

## 2021-03-31 DIAGNOSIS — Z7189 Other specified counseling: Secondary | ICD-10-CM | POA: Diagnosis not present

## 2021-03-31 DIAGNOSIS — T68XXXA Hypothermia, initial encounter: Secondary | ICD-10-CM

## 2021-03-31 DIAGNOSIS — K8020 Calculus of gallbladder without cholecystitis without obstruction: Secondary | ICD-10-CM

## 2021-03-31 DIAGNOSIS — J9621 Acute and chronic respiratory failure with hypoxia: Secondary | ICD-10-CM

## 2021-03-31 DIAGNOSIS — E8729 Other acidosis: Secondary | ICD-10-CM

## 2021-03-31 DIAGNOSIS — R739 Hyperglycemia, unspecified: Secondary | ICD-10-CM | POA: Diagnosis not present

## 2021-03-31 LAB — CBC
HCT: 30.7 % — ABNORMAL LOW (ref 36.0–46.0)
Hemoglobin: 9.7 g/dL — ABNORMAL LOW (ref 12.0–15.0)
MCH: 29.6 pg (ref 26.0–34.0)
MCHC: 31.6 g/dL (ref 30.0–36.0)
MCV: 93.6 fL (ref 80.0–100.0)
Platelets: 148 10*3/uL — ABNORMAL LOW (ref 150–400)
RBC: 3.28 MIL/uL — ABNORMAL LOW (ref 3.87–5.11)
RDW: 21.2 % — ABNORMAL HIGH (ref 11.5–15.5)
WBC: 29.9 10*3/uL — ABNORMAL HIGH (ref 4.0–10.5)
nRBC: 0 % (ref 0.0–0.2)

## 2021-03-31 LAB — BLOOD GAS, ARTERIAL
Acid-Base Excess: 2 mmol/L (ref 0.0–2.0)
Acid-Base Excess: 2.8 mmol/L — ABNORMAL HIGH (ref 0.0–2.0)
Bicarbonate: 23.6 mmol/L (ref 20.0–28.0)
Bicarbonate: 26.2 mmol/L (ref 20.0–28.0)
Drawn by: 38235
Drawn by: 38235
FIO2: 28
FIO2: 40
O2 Saturation: 99 %
O2 Saturation: 95.5 %
Patient temperature: 35.3
Patient temperature: 34.4
pCO2 arterial: 110 mmHg (ref 32.0–48.0)
pCO2 arterial: 51 mmHg — ABNORMAL HIGH (ref 32.0–48.0)
pH, Arterial: 7.076 — CL (ref 7.350–7.450)
pH, Arterial: 7.349 — ABNORMAL LOW (ref 7.350–7.450)
pO2, Arterial: 257 mmHg — ABNORMAL HIGH (ref 83.0–108.0)
pO2, Arterial: 60.5 mmHg — ABNORMAL LOW (ref 83.0–108.0)

## 2021-03-31 LAB — COMPREHENSIVE METABOLIC PANEL
ALT: 18 U/L (ref 0–44)
AST: 45 U/L — ABNORMAL HIGH (ref 15–41)
Albumin: 1.9 g/dL — ABNORMAL LOW (ref 3.5–5.0)
Alkaline Phosphatase: 81 U/L (ref 38–126)
Anion gap: 7 (ref 5–15)
BUN: 17 mg/dL (ref 8–23)
CO2: 29 mmol/L (ref 22–32)
Calcium: 6.9 mg/dL — ABNORMAL LOW (ref 8.9–10.3)
Chloride: 102 mmol/L (ref 98–111)
Creatinine, Ser: 0.93 mg/dL (ref 0.44–1.00)
GFR, Estimated: >60 mL/min (ref >60)
Glucose, Bld: 200 mg/dL — ABNORMAL HIGH (ref 70–99)
Potassium: 4.1 mmol/L (ref 3.5–5.1)
Sodium: 138 mmol/L (ref 135–145)
Total Bilirubin: 0.7 mg/dL (ref 0.3–1.2)
Total Protein: 4.6 g/dL — ABNORMAL LOW (ref 6.5–8.1)

## 2021-03-31 LAB — URINALYSIS, ROUTINE W REFLEX MICROSCOPIC
Bilirubin Urine: NEGATIVE
Glucose, UA: 50 mg/dL — AB
Ketones, ur: 5 mg/dL — AB
Leukocytes,Ua: NEGATIVE
Nitrite: NEGATIVE
Protein, ur: 30 mg/dL — AB
Specific Gravity, Urine: 1.009 (ref 1.005–1.030)
pH: 6 (ref 5.0–8.0)

## 2021-03-31 LAB — GLUCOSE, CAPILLARY
Glucose-Capillary: 161 mg/dL — ABNORMAL HIGH (ref 70–99)
Glucose-Capillary: 139 mg/dL — ABNORMAL HIGH (ref 70–99)
Glucose-Capillary: 153 mg/dL — ABNORMAL HIGH (ref 70–99)
Glucose-Capillary: 151 mg/dL — ABNORMAL HIGH (ref 70–99)
Glucose-Capillary: 171 mg/dL — ABNORMAL HIGH (ref 70–99)

## 2021-03-31 LAB — PHOSPHORUS: Phosphorus: 2.8 mg/dL (ref 2.5–4.6)

## 2021-03-31 LAB — LACTIC ACID, PLASMA: Lactic Acid, Venous: 2 mmol/L (ref 0.5–1.9)

## 2021-03-31 LAB — MAGNESIUM: Magnesium: 1.5 mg/dL — ABNORMAL LOW (ref 1.7–2.4)

## 2021-03-31 LAB — TSH: TSH: 12.076 u[IU]/mL — ABNORMAL HIGH (ref 0.350–4.500)

## 2021-03-31 LAB — PROCALCITONIN: Procalcitonin: 11.34 ng/mL

## 2021-03-31 LAB — HEMOGLOBIN A1C
Hgb A1c MFr Bld: 5.1 % (ref 4.8–5.6)
Mean Plasma Glucose: 99.67 mg/dL

## 2021-03-31 LAB — BRAIN NATRIURETIC PEPTIDE: B Natriuretic Peptide: 310 pg/mL — ABNORMAL HIGH (ref 0.0–100.0)

## 2021-03-31 MED ORDER — FENTANYL 2500MCG IN NS 250ML (10MCG/ML) PREMIX INFUSION: 0.0000 ug/h | INTRAVENOUS | Status: DC

## 2021-03-31 MED ORDER — IPRATROPIUM-ALBUTEROL 0.5-2.5 (3) MG/3ML IN SOLN
3.0000 mL | Freq: Four times a day (QID) | RESPIRATORY_TRACT | Status: DC
Start: 2021-03-31 — End: 2021-04-01
  Administered 2021-03-31 – 2021-04-01 (×6): 3 mL via RESPIRATORY_TRACT
  Filled 2021-03-31 (×7): qty 3

## 2021-03-31 MED ORDER — CHLORHEXIDINE GLUCONATE 0.12% ORAL RINSE (MEDLINE KIT)
15.0000 mL | Freq: Two times a day (BID) | OROMUCOSAL | Status: DC
  Administered 2021-03-31 – 2021-04-01 (×3): 15 mL via OROMUCOSAL

## 2021-03-31 MED ORDER — MIDAZOLAM HCL 2 MG/2ML IJ SOLN
2.0000 mg | INTRAMUSCULAR | Status: DC | PRN
  Administered 2021-03-31: 2 mg via INTRAVENOUS
  Filled 2021-03-31: qty 2

## 2021-03-31 MED ORDER — ETOMIDATE 2 MG/ML IV SOLN
INTRAVENOUS | Status: AC
  Administered 2021-03-31: 20 mg
  Filled 2021-03-31: qty 20

## 2021-03-31 MED ORDER — SUCCINYLCHOLINE CHLORIDE 200 MG/10ML IV SOSY
PREFILLED_SYRINGE | INTRAVENOUS | Status: AC
  Administered 2021-03-31: 200 mg
  Filled 2021-03-31: qty 10

## 2021-03-31 MED ORDER — MAGNESIUM SULFATE 2 GM/50ML IV SOLN
2.0000 g | Freq: Once | INTRAVENOUS | Status: AC
  Administered 2021-03-31: 2 g via INTRAVENOUS
  Filled 2021-03-31: qty 50

## 2021-03-31 MED ORDER — FENTANYL BOLUS VIA INFUSION
25.0000 ug | INTRAVENOUS | Status: DC | PRN
  Administered 2021-03-31 (×2): 50 ug via INTRAVENOUS
  Administered 2021-03-31 (×2): 25 ug via INTRAVENOUS
  Filled 2021-03-31: qty 100

## 2021-03-31 MED ORDER — FENTANYL CITRATE PF 50 MCG/ML IJ SOSY
25.0000 ug | PREFILLED_SYRINGE | Freq: Once | INTRAMUSCULAR | Status: AC
  Administered 2021-03-31: 25 ug via INTRAVENOUS

## 2021-03-31 MED ORDER — MIDAZOLAM HCL 2 MG/2ML IJ SOLN
INTRAMUSCULAR | Status: AC
  Filled 2021-03-31: qty 2

## 2021-03-31 MED ORDER — VITAL AF 1.2 CAL PO LIQD
1000.0000 mL | ORAL | Status: DC
  Administered 2021-03-31: 1000 mL

## 2021-03-31 MED ORDER — SODIUM CHLORIDE 0.9% FLUSH: 10.0000 mL | INTRAVENOUS | Status: DC | PRN

## 2021-03-31 MED ORDER — FENTANYL CITRATE (PF) 100 MCG/2ML IJ SOLN
INTRAMUSCULAR | Status: AC
  Filled 2021-03-31: qty 2

## 2021-03-31 MED ORDER — FENTANYL 2500MCG IN NS 250ML (10MCG/ML) PREMIX INFUSION
25.0000 ug/h | INTRAVENOUS | Status: DC
  Administered 2021-03-31: 25 ug/h via INTRAVENOUS
  Administered 2021-03-31: 100 ug/h via INTRAVENOUS
  Filled 2021-03-31 (×2): qty 250

## 2021-03-31 MED ORDER — KETAMINE HCL 50 MG/5ML IJ SOSY
PREFILLED_SYRINGE | INTRAMUSCULAR | Status: AC
  Filled 2021-03-31: qty 5

## 2021-03-31 MED ORDER — ACETAMINOPHEN 160 MG/5ML PO SOLN: 650.0000 mg | Freq: Four times a day (QID) | ORAL | Status: DC | PRN

## 2021-03-31 MED ORDER — SODIUM CHLORIDE 0.9% FLUSH
10.0000 mL | Freq: Two times a day (BID) | INTRAVENOUS | Status: DC
  Administered 2021-03-31 – 2021-04-01 (×3): 10 mL

## 2021-03-31 MED ORDER — PROSOURCE TF PO LIQD
45.0000 mL | Freq: Every day | ORAL | Status: DC
  Administered 2021-03-31 – 2021-04-01 (×2): 45 mL
  Filled 2021-03-31 (×2): qty 45

## 2021-03-31 MED ORDER — RACEPINEPHRINE HCL 2.25 % IN NEBU
INHALATION_SOLUTION | RESPIRATORY_TRACT | Status: AC
  Administered 2021-03-31: 0.5 mL
  Filled 2021-03-31: qty 0.5

## 2021-03-31 MED ORDER — ORAL CARE MOUTH RINSE
15.0000 mL | OROMUCOSAL | Status: DC
  Administered 2021-03-31 – 2021-04-01 (×13): 15 mL via OROMUCOSAL

## 2021-03-31 MED ORDER — PANTOPRAZOLE 2 MG/ML SUSPENSION
40.0000 mg | Freq: Every day | ORAL | Status: DC
  Administered 2021-03-31 – 2021-04-01 (×2): 40 mg
  Filled 2021-03-31 (×2): qty 20

## 2021-03-31 MED ORDER — FENTANYL CITRATE PF 50 MCG/ML IJ SOSY
PREFILLED_SYRINGE | INTRAMUSCULAR | Status: AC
  Filled 2021-03-31: qty 2

## 2021-03-31 MED ORDER — ROCURONIUM BROMIDE 10 MG/ML (PF) SYRINGE
PREFILLED_SYRINGE | INTRAVENOUS | Status: AC
  Filled 2021-03-31: qty 10

## 2021-03-31 MED ORDER — IOHEXOL 350 MG/ML SOLN
100.0000 mL | Freq: Once | INTRAVENOUS | Status: AC | PRN
  Administered 2021-03-31: 100 mL via INTRAVENOUS

## 2021-03-31 NOTE — Progress Notes (Addendum)
CRITICAL CARE NOTE  RN called due to patient's worsening of mental status and stridor.  ABG was obtained and showed pH 7.076, PCO2 110, PO2 257, bicarb 23.6 at FiO2 of 28.  At bedside, patient has an audible stridor.  eLink was consulted, daughter was called and she gave consent for intubation.  ED physician ( Dr. Sedonia Small) helped with the intubation. She was sedated with IV Fentanyl.  Peripheral IV Levophed was started. Daughter presented to bedside, goal of care was discussed. She decided to change patient's status to a DNR and to wait to see if patient's condition could improve while being intubated, otherwise she will change patient to comfort care, in the meantime she will discuss with her sibling(s) regarding patient's status. Central line (right femoral) was placed due to patient soft BP despite peripheral IV Levophed.  CBC Latest Ref Rng & Units 03/31/2021 03/29/2021 03/28/2021  WBC 4.0 - 10.5 K/uL 29.9(H) 8.3 9.0  Hemoglobin 12.0 - 15.0 g/dL 9.7(L) 10.4(L) 11.1(L)  Hematocrit 36.0 - 46.0 % 30.7(L) 32.9(L) 35.6(L)  Platelets 150 - 400 K/uL 148(L) 123(L) 97(L)   CMP Latest Ref Rng & Units 03/31/2021 03/29/2021 03/28/2021  Glucose 70 - 99 mg/dL 200(H) 71 100(H)  BUN 8 - 23 mg/dL 17 19 27(H)  Creatinine 0.44 - 1.00 mg/dL 0.93 1.19(H) 1.85(H)  Sodium 135 - 145 mmol/L 138 138 138  Potassium 3.5 - 5.1 mmol/L 4.1 4.4 5.1  Chloride 98 - 111 mmol/L 102 104 103  CO2 22 - 32 mmol/L $RemoveB'29 22 25  'qPWILtjh$ Calcium 8.9 - 10.3 mg/dL 6.9(L) 6.8(L) 7.0(L)  Total Protein 6.5 - 8.1 g/dL 4.6(L) 4.9(L) 4.9(L)  Total Bilirubin 0.3 - 1.2 mg/dL 0.7 0.6 0.9  Alkaline Phos 38 - 126 U/L 81 63 65  AST 15 - 41 U/L 45(H) 48(H) 47(H)  ALT 0 - 44 U/L $Remo'18 17 16     'JbdlP$ Vital Signs: Blood pressure 121/89, pulse (!) 109, temperature (!) 94.6 F (34.8 C), temperature source Rectal, resp. rate (!) 21, height $RemoveBe'5\' 5"'iEJulcjGk$  (1.651 m), weight 46.7 kg, SpO2 100 %.   Physical Exam  Gen:-Intubated and sedated HEENT:- [place].AT, No sclera icterus,  PERRL Neck-Supple Neck,No JVD, Mass palpated on right side of neck Lungs-  CTAB  CV- S1, S2 normal Abd-  +ve B.Sounds, Abd Soft, No tenderness,    Extremity/Skin:- No  edema,    Psych/Neuro-This cannot be assessed at this time due to patient being intubated and sedated   Assessment and plan: Respiratory acidosis secondary to respiratory failure with hypoxia and hypercapnia Initial ABG: pH 7.076, PCO2 110, PO2 257, bicarb 23.6 at FiO2 of 28 Subsequent ABG: 7.349, PCO2 51, PCO2 60.5, bicarb 26.2 Chest x-ray showed endotracheal tube tip 1.5 cm above the inferior margin of the carina. Patient was intubated and sedated Right femoral central line was placed Continue IV fentanyl drip Continue IV Levophed PCCM will be consulted and we shall await further recommendation  Hypothermia due to unknown cause at this time Temperature 94.14F; continue Bair hugger TSH pending  SIRS with suspicion for sepsis Patient was tachypneic, hypothermic and present with leukocytosis (met SIRS criteria).  No known source of an infection noted at this time Chest x-ray showed no acute cardiopulmonary process Continue meropenem  Hypomagnesemia Mg level is 1.5 This will be replenished Please continue to monitor Mg level and correct accordingly  Hyperglycemia CBG 200, this is possibly secondary to steroid effect Continue ISS and hypoglycemic protocol  Leukocytosis This is possibly secondary to steroid effect/infectious process Continue  to monitor WBC  Goal of care: Patient's status was changed to DNR while awaiting decision to make patient comfort care if no clinical improvement in patient.  Palliative care will be consulted  Prognosis: Grave due to patient's history of Papillary thyroid carcinoma, recurrent stage IVB ([id number]) with metastases to mediastinum and other comorbidities  Critical time: 71 minutes   Critical care personally provided  managing the patient due to high probability of  clinically significant and life threatening deterioration. This critical care time included obtaining a history; examining the patient, pulse oximetry; ordering and review of studies; arranging urgent treatment with development of a management plan; evaluation of patient's response of treatment; frequent reassessment; and discussions with other providers.  This critical care time was performed to assess and manage the high probability of imminent and life threatening deterioration that could result in multi-organ failure.

## 2021-03-31 NOTE — Progress Notes (Addendum)
Initial Nutrition Assessment  DOCUMENTATION CODES:   Severe malnutrition in context of chronic illness  INTERVENTION:  Initiate Vital 1.2 @ 30 ml/hr via OGT and increase by 10 ml every 6 hours to goal rate of 40 ml/hr.   Add ProSource TF -45 ml per tube daily.   Tube feeding regimen provides 1192 kcal, 83 grams of protein, and 779 ml of H2O.    Monitor magnesium, potassium, and phosphorus BID for at least 3 days, MD to replete as needed, as pt is at risk for refeeding syndrome given pt malnourished state.  NUTRITION DIAGNOSIS:   Severe Malnutrition related to cancer and cancer related treatments, chronic illness as evidenced by energy intake < or equal to 75% for > or equal to 1 month, per patient/family report, severe muscle depletion, BMI 17.13.   GOAL:  Provide needs based on ASPEN/SCCM guidelines   MONITOR:  TF tolerance, Labs, I & O's, Skin, Weight trends  REASON FOR ASSESSMENT:   Consult Enteral/tube feeding initiation and management  ASSESSMENT: PMH: Patient is an underweight 75 yo with COPD, HCAP, GERD, HTN, Anemia, Arthritis, Anxiety, Chronic back pain, papillary thyroid cancer -stage IVb. Presented with severe sepsis.   Patient intubated early this morning and now on ventilator and pressor support. Audible stridor per MD. Currently- Stage 2 PI to sacrum. PICC placed.   Sisters are bedside and assisted with nutrition history.   Weights reviewed. Usual range: 45-46 kg the past 3 months with 1 outlier wt of 42.2 kg.   Last BM-10/31  MV:12 L/min Temp (24hrs), Avg:96.3 F (35.7 C), Min:94.6 F (34.8 C), Max:97.5 F (36.4 C)  Fentanyl Drip: sedation    Intake/Output Summary (Last 24 hours) at 03/31/2021 1333 Last data filed at 03/31/2021 1032 Gross per 24 hour  Intake 2397.58 ml  Output 350 ml  Net 2047.58 ml     Labs:  BMP Latest Ref Rng & Units 03/31/2021 03/29/2021 03/28/2021  Glucose 70 - 99 mg/dL 200(H) 71 100(H)  BUN 8 - 23 mg/dL 17 19 27(H)   Creatinine 0.44 - 1.00 mg/dL 0.93 1.19(H) 1.85(H)  BUN/Creat Ratio 12 - 28 - - -  Sodium 135 - 145 mmol/L 138 138 138  Potassium 3.5 - 5.1 mmol/L 4.1 4.4 5.1  Chloride 98 - 111 mmol/L 102 104 103  CO2 22 - 32 mmol/L 29 22 25   Calcium 8.9 - 10.3 mg/dL 6.9(L) 6.8(L) 7.0(L)     NUTRITION - FOCUSED PHYSICAL EXAM: Limited Nutrition-Focused physical exam due to intubation moderate upper arm fat depletion, severe clavicle and acromion process muscle depletion, and mild edema.     Diet Order:   Diet Order     None       EDUCATION NEEDS:  Not appropriate for education at this time  Skin:  Skin Assessment: Skin Integrity Issues: Skin Integrity Issues:: Stage II Stage II: sacrum serosanguineous per nursing assessment  Last BM:  10/31  Height:   Ht Readings from Last 1 Encounters:  03/31/21 5\' 5"  (1.651 m)    Weight:   Wt Readings from Last 1 Encounters:  03/31/21 46.7 kg    Ideal Body Weight:   57 kg   BMI:  Body mass index is 17.13 kg/m.  Estimated Nutritional Needs:   Kcal:  1275  Protein:  85-94 gr  Fluid:  >1200 ml daily  Colman Cater MS,RD,CSG,LDN Contact: Shea Evans

## 2021-03-31 NOTE — Progress Notes (Signed)
Palliative: Chart review completed.  Overnight/early morning Mrs. Giglia experienced respiratory distress and was emergently intubated.  She has needed vasopressors for blood pressure support.  She appears acutely/chronically ill and very frail.  Son, Abigail Teall, is at bedside.  He tells me that when Mrs. Stauch came to the hospital she told him that she did not feel like she would be able to come home.  Ray also shares, unsolicited, that they are facing a choice to take her off of ventilator support.  Ray shares that Mrs. Rotundo's other son, Raea Magallon, is in [facility].  Chaplain notified for assistance.  Bedside conference with daughter, Shanielle Correll.  She shares that she is waiting for test results before making choices for compassionate extubation.  She also shares that she is appreciative of staff attempting to have her brother who is currently in [facility] see Mrs. Valere Dross.  All questions answered.  Conference with attending, bedside nursing staff, chaplain, transition of care team related to patient condition, needs, goals of care, disposition.  Plan:   At this point continue to treat but no CPR.  Considering compassionate extubation after further test results and visit from her brother who is in [facility].    64 minutes  Quinn Axe, NP Palliative medicine team Team phone [phone] Greater than 50% of this time was spent counseling and coordinating care related to the above assessment and plan.

## 2021-03-31 NOTE — Progress Notes (Signed)
RT informed that ET tube needed to be retracted 3 cm. Reviewed films and got RN to assist with tube retraction. Upon looking at ET tube placement was at 26 cm. Tube retracted to 23cm at the lip. Patient tolerated procedure well and no adverse events noted. Bilateral equal breath sounds noted and good tidal volumes and peak pressures on vent.

## 2021-03-31 NOTE — Progress Notes (Addendum)
PROGRESS NOTE  Marilyn Solomon IWL:[id number] DOB: 02-07-1946 DOA: 03/06/2021 PCP: Lindell Spar, MD Brief History:  75 y.o. female with medical history significant of thyroid disease, gastroesophageal reflux disease, hypertension, COPD, chronic respiratory failure with hypoxia (using 2 L nasal cannula supplementation at baseline), papillary thyroid cancer stage 4b and a recent admission approximately a month ago secondary to COVID-pneumonia; who presented to the hospital secondary to abdominal pain, nausea, vomiting and diarrhea; no overt bleeding appreciated.  Abdominal pain described as dull/crampy in nature, 7-8/10 in intensity, associated with nausea/vomiting and no aggravating factors.  Her symptoms have been present for the last 2-3 days and worsening.  She reports decreased oral intake.  Patient also reports chills but never measured temperature at home.  No chest pain, no focal weaknesses, no dysuria or hematuria, no other complaints.   Patient vaccinated against COVID; no booster.   ED Course: Severe SIRS/Sepsis criteria meta at time of admission with temperature of 96.6, respiratory rate of 27, soft blood pressure with a systolic blood pressure in the 80s to 90's; elevated WBCs at 16.4 and a lactic acid of 6.6; source of infection appears to be a calculus cholecystitis and organ dysfunction acute on chronic renal failure.  Fluid resuscitation following sepsis protocol provided with good improvement in patient's blood pressure and decreasing her lactic acid to 4.5.  Cultures were taken and patient has been started on broad-spectrum antibiotics.  11/2--moved to ICU due to hypotension on floor; however, BP was improved in ICU not requiring any interventions  11/3--early am--pt noted to be obtunded with stridor.  ABG 7.076/110/43 on 2L.  Hypotensive after intubation requiring levophed.  Femoral TLC place   Assessment/Plan: severe sepsis: -patient met severe sepsis criteria at  time of admission with elevated resp rate, soft BP, elevated WBC's, hypothermia, presume source of infection acalculous cholecystitis and renal failure for organ dysfunction. -Right upper quadrant ultrasound demonstrating sludge and mild wall thickening; no Murphy sign. -HIDA scan positive for gallbladder dysfunction; but false positive results can be appreciated in patient's with prolonged fasting. --Following general surgery recommendations, will advance diet and assess clinical response.   -11/2 AM--hypotension-->move to stepdown, start levophed -11/2--repeat blood culture, obtain UA, culture, am cortisol -11/2--check lactate 1.8>>1.2 -11/2--PCT 20.13; abx changed to [place]; -11/2 CXR--personally reviewed--no infiltrates/consolidation -11/2--started IV solumedrol -discussed with Dr. Arnoldo Morale -11/3 early am--obtunded with  ABG 7.076/110/43 on 2L>>intubated, PCCM consulted -11/3 CXR--personally reviewed--increased interstitial markings, small pleural eff -follow blood culture   Acute kidney injury -in the setting of pre-renal azotemia and hypotension -continue to maintain adequate oral hydration now that diet will be advanced; wean off IV fluids. -baseline creatinine 0.5-0.8 -presented with serum creatinine 2.64 -follow renal function trend   Acute on chronic resp failure with hypoxia and hypercarbia -intubated 11/3 early am -had dose xanax 0.5 mg on 11/2$RemoveB'@2120'HnBSlICc$  -PCCM consult -tracheal asp -start enteral feeding -continue IV steroids -CTA chest  papillary thyroid cancer stage IVb -holding chemotherapy currently in the setting of infection -follows [facility] Cyril Loosen at [facility] -on lenvatinib 18 mg daily prior to admission--started 10/1 -has RAI-resistant disease with recurrent dz only after 4 months after RAI -now has appt with Dr. Delton Coombes 04/04/21 -02/11/21 CT neck--Persistent 66 mm, predominantly necrotic mass encasing the trachea and right common carotid artery;  Multiple small  hyperenhancing superior mediastinal lymph nodes, suspicious for nodal metastases -03/31/21-CT neck  GERD -continue PPI   nausea/Vomiting/Diarrhea -in the setting of problem #1 -continue supportive  care, antiemetics and analgesics -C. Diff neg -Provide as needed loperamide. -resolved   HTN -holding amlodipine due to hypotension   hypothyroidism -continue synthroid      severe protein calorie malnutrition  -Body mass index is 15.78 kg/m. -feeding supplements and nutritional service will be pursuit.   stage II pressure injury -localized in his sacrum, POA -no signs of superimposed infection -continue barrier therapy and constant repositioning   hypomagnesemia -repleted      The patient is critically ill with multiple organ systems failure and requires high complexity decision making for assessment and support, frequent evaluation and titration of therapies, application of advanced monitoring technologies and extensive interpretation of multiple databases.  Critical care time - 40 mins.      Status is: Inpatient   Remains inpatient appropriate because: hemodynamic instability               Family Communication:  no Family at bedside   Consultants:  general surgery; PCCM   Code Status:  DNR   DVT Prophylaxis:  [place] Heparin      Procedures: As Listed in Progress Note Above   Antibiotics: Cefepime 10/30>>11/2 Merrem 11/2>>   Subjective:  Patient is intubated and sedated on mechanical ventilation.  ROS not possible Objective: Vitals:   03/31/21 0545 03/31/21 0600 03/31/21 0615 03/31/21 0630  BP: (!) 183/80 126/64 99/73 115/66  Pulse: (!) 55 60 100 71  Resp: (!) 24 (!) 24 (!) 23 (!) 24  Temp: (!) 95.7 F (35.4 C)   (!) 96.3 F (35.7 C)  TempSrc: Rectal   Rectal  SpO2: 100% 100% 100% 100%  Weight:      Height:        Intake/Output Summary (Last 24 hours) at 03/31/2021 0724 Last data filed at 03/31/2021 0531 Gross per 24 hour  Intake 2103.33 ml   Output 475 ml  Net 1628.33 ml   Weight change: 1.1 kg Exam:  General:  Pt is alert, follows commands appropriately, not in acute distress HEENT: No icterus, No thrush, No neck mass, [place]/AT Cardiovascular: RRR, S1/S2, no rubs, no gallops Respiratory: bibasilar rales. No wheeze Abdomen: Soft/+BS, non tender, non distended, no guarding Extremities: 1 + LE edema, No lymphangitis, No petechiae, No rashes, no synovitis   Data Reviewed: I have personally reviewed following labs and imaging studies Basic Metabolic Panel: Recent Labs  Lab 02/28/2021 0858 03/28/21 0438 03/29/21 0929 03/31/21 0413  NA 139 138 138 138  K 5.4* 5.1 4.4 4.1  CL 97* 103 104 102  CO2 $Re'23 25 22 29  'dps$ GLUCOSE 147* 100* 71 200*  BUN 26* 27* 19 17  CREATININE 2.64* 1.85* 1.19* 0.93  CALCIUM 8.5* 7.0* 6.8* 6.9*  MG  --  1.6*  --  1.5*  PHOS  --  5.0*  --  2.8   Liver Function Tests: Recent Labs  Lab 03/10/2021 0858 03/28/21 0438 03/29/21 0929 03/31/21 0413  AST 66* 47* 48* 45*  ALT $Re'20 16 17 18  'Ubv$ ALKPHOS 91 65 63 81  BILITOT 0.9 0.9 0.6 0.7  PROT 6.5 4.9* 4.9* 4.6*  ALBUMIN 2.9* 2.1* 2.1* 1.9*   No results for input(s): LIPASE, AMYLASE in the last 168 hours. No results for input(s): AMMONIA in the last 168 hours. Coagulation Profile: Recent Labs  Lab 03/22/2021 0858  INR 1.0   CBC: Recent Labs  Lab 03/03/2021 0858 03/28/21 0438 03/29/21 1308 03/31/21 0413  WBC 16.4* 9.0 8.3 29.9*  NEUTROABS 12.3*  --   --   --  HGB 14.2 11.1* 10.4* 9.7*  HCT 45.4 35.6* 32.9* 30.7*  MCV 92.1 93.0 93.2 93.6  PLT 158 97* 123* 148*   Cardiac Enzymes: No results for input(s): CKTOTAL, CKMB, CKMBINDEX, TROPONINI in the last 168 hours. BNP: Invalid input(s): POCBNP CBG: Recent Labs  Lab 03/31/21 0309  GLUCAP 161*   HbA1C: No results for input(s): HGBA1C in the last 72 hours. Urine analysis:    Component Value Date/Time   COLORURINE YELLOW 03/26/2021 0525   APPEARANCEUR CLEAR 03/22/2021 0525   LABSPEC  1.011 03/16/2021 0525   PHURINE 5.0 03/03/2021 0525   GLUCOSEU NEGATIVE 03/23/2021 0525   HGBUR SMALL (A) 03/10/2021 0525   BILIRUBINUR NEGATIVE 03/10/2021 0525   KETONESUR NEGATIVE 03/14/2021 0525   PROTEINUR NEGATIVE 03/08/2021 0525   UROBILINOGEN 0.2 03/25/2008 1702   NITRITE NEGATIVE 03/23/2021 0525   LEUKOCYTESUR MODERATE (A) 03/13/2021 0525   Sepsis Labs: $RemoveBefo'@LABRCNTIP'xqUdJyRNctv$ (procalcitonin:4,lacticidven:4) ) Recent Results (from the past 240 hour(s))  Urine Culture     Status: None   Collection Time: 03/18/2021  5:25 AM   Specimen: In/Out Cath Urine  Result Value Ref Range Status   Specimen Description   Final    IN/OUT CATH URINE Performed at [facility], [address] [place], [place] [address]    Special Requests   Final    NONE Performed at [facility], [address] [place], [place] [address]    Culture   Final    NO GROWTH Performed at [facility], [address], [place], [place] [address]    Report Status 03/30/2021 FINAL  Final  Blood culture (routine single)     Status: None (Preliminary result)   Collection Time: 03/10/2021  8:59 AM   Specimen: BLOOD  Result Value Ref Range Status   Specimen Description BLOOD  Final   Special Requests NONE  Final   Culture   Final    NO GROWTH 3 DAYS Performed at [facility], [address], [place], [place] [address]    Report Status PENDING  Incomplete  Resp Panel by RT-PCR (Flu A&B, Covid) Nasopharyngeal Swab     Status: None   Collection Time: 03/06/2021  6:36 PM   Specimen: Nasopharyngeal Swab; Nasopharyngeal(NP) swabs in vial transport medium  Result Value Ref Range Status   SARS Coronavirus 2 by RT PCR NEGATIVE NEGATIVE Final    Comment: (NOTE) SARS-CoV-2 target nucleic acids are NOT DETECTED.  The SARS-CoV-2 RNA is generally detectable in upper respiratory specimens during the acute phase of infection. The lowest concentration of SARS-CoV-2 viral copies this assay can detect is 138 copies/mL. A negative result  does not preclude SARS-Cov-2 infection and should not be used as the sole basis for treatment or other patient management decisions. A negative result may occur with  improper specimen collection/handling, submission of specimen other than nasopharyngeal swab, presence of viral mutation(s) within the areas targeted by this assay, and inadequate number of viral copies(<138 copies/mL). A negative result must be combined with clinical observations, patient history, and epidemiological information. The expected result is Negative.  Fact Sheet for Patients:  EntrepreneurPulse.com.au  Fact Sheet for Healthcare Providers:  IncredibleEmployment.be  This test is no t yet approved or cleared by the [place] FDA and  has been authorized for detection and/or diagnosis of SARS-CoV-2 by FDA under an Emergency Use Authorization (EUA). This EUA will remain  in effect (meaning this test can be used) for the duration of the COVID-19 declaration under Section 564(b)(1) of the Act, 21  U.S.C.section 360bbb-3(b)(1), unless the authorization is terminated  or revoked sooner.       Influenza A by PCR NEGATIVE NEGATIVE Final   Influenza B by PCR NEGATIVE NEGATIVE Final    Comment: (NOTE) The Xpert Xpress SARS-CoV-2/FLU/RSV plus assay is intended as an aid in the diagnosis of influenza from Nasopharyngeal swab specimens and should not be used as a sole basis for treatment. Nasal washings and aspirates are unacceptable for Xpert Xpress SARS-CoV-2/FLU/RSV testing.  Fact Sheet for Patients: EntrepreneurPulse.com.au  Fact Sheet for Healthcare Providers: IncredibleEmployment.be  This test is not yet approved or cleared by the [place] FDA and has been authorized for detection and/or diagnosis of SARS-CoV-2 by FDA under an Emergency Use Authorization (EUA). This EUA will remain in effect (meaning this test can be used) for  the duration of the COVID-19 declaration under Section 564(b)(1) of the Act, 21 U.S.C. section 360bbb-3(b)(1), unless the authorization is terminated or revoked.  Performed at [facility], [address], [place], [place] [address]   C Difficile Quick Screen w PCR reflex     Status: None   Collection Time: 03/14/2021 10:07 PM   Specimen: STOOL  Result Value Ref Range Status   C Diff antigen NEGATIVE NEGATIVE Final   C Diff toxin NEGATIVE NEGATIVE Final   C Diff interpretation No C. difficile detected.  Final    Comment: Performed at [facility], [address], [place], [place] [address]  MRSA Next Gen by PCR, Nasal     Status: None   Collection Time: 03/28/21  5:45 AM   Specimen: Nasal Mucosa; Nasal Swab  Result Value Ref Range Status   MRSA by PCR Next Gen NOT DETECTED NOT DETECTED Final    Comment: (NOTE) The GeneXpert MRSA Assay (FDA approved for NASAL specimens only), is one component of a comprehensive MRSA colonization surveillance program. It is not intended to diagnose MRSA infection nor to guide or monitor treatment for MRSA infections. Test performance is not FDA approved in patients less than 16 years old. Performed at [facility], [address], [place], [place] [address]   Culture, blood (Routine X 2) w Reflex to ID Panel     Status: None (Preliminary result)   Collection Time: 03/30/21  9:55 AM   Specimen: BLOOD LEFT ARM  Result Value Ref Range Status   Specimen Description BLOOD LEFT ARM  Final   Special Requests   Final    BOTTLES DRAWN AEROBIC AND ANAEROBIC Blood Culture adequate volume Performed at [facility], [address] [place], [place] [address]    Culture PENDING  Incomplete   Report Status PENDING  Incomplete  Culture, blood (Routine X 2) w Reflex to ID Panel     Status: None (Preliminary result)   Collection Time: 03/30/21  9:56 AM   Specimen: BLOOD RIGHT HAND  Result Value Ref Range Status   Specimen Description BLOOD RIGHT HAND  Final    Special Requests   Final    BOTTLES DRAWN AEROBIC AND ANAEROBIC Blood Culture results may not be optimal due to an inadequate volume of blood received in culture bottles Performed at [facility], [address] [place], [place] [address]    Culture PENDING  Incomplete   Report Status PENDING  Incomplete  MRSA Next Gen by PCR, Nasal     Status: None   Collection Time: 03/30/21 11:11 AM   Specimen: Nasal Mucosa; Nasal Swab  Result Value Ref Range Status   MRSA by PCR Next Gen  NOT DETECTED NOT DETECTED Final    Comment: (NOTE) The GeneXpert MRSA Assay (FDA approved for NASAL specimens only), is one component of a comprehensive MRSA colonization surveillance program. It is not intended to diagnose MRSA infection nor to guide or monitor treatment for MRSA infections. Test performance is not FDA approved in patients less than 17 years old. Performed at [facility], [address], [place], [place] [address]      Scheduled Meds:  arformoterol  15 mcg Nebulization BID   Chlorhexidine Gluconate Cloth  6 each Topical Daily   heparin  5,000 Units Subcutaneous Q8H   ipratropium-albuterol  3 mL Nebulization Q6H   levothyroxine  75 mcg Oral Daily   mouth rinse  15 mL Mouth Rinse BID   methylPREDNISolone (SOLU-MEDROL) injection  60 mg Intravenous Q12H   pantoprazole  40 mg Oral Daily   Continuous Infusions:  sodium chloride     sodium chloride     fentaNYL infusion INTRAVENOUS 75 mcg/hr (03/31/21 0525)   lactated ringers Stopped (03/31/21 0524)   meropenem (MERREM) IV Stopped (03/31/21 0044)   norepinephrine (LEVOPHED) Adult infusion 5 mcg/min (03/31/21 0526)    Procedures/Studies: CT ABDOMEN PELVIS WO CONTRAST  Result Date: 03/20/2021 CLINICAL DATA:  Left upper quadrant abdominal pain and sepsis. EXAM: CT ABDOMEN AND PELVIS WITHOUT CONTRAST TECHNIQUE: Multidetector CT imaging of the abdomen and pelvis was performed following the standard protocol without IV contrast. COMPARISON:  CT  scan 10/07/2019 FINDINGS: Lower chest: Emphysematous changes and pulmonary scarring. Moderate peripheral peribronchial thickening. No focal infiltrates or pleural effusions. The heart is normal in size. No pericardial effusion. Stable aortic and coronary artery calcifications. Hepatobiliary: No obvious hepatic lesions without contrast. Gallbladder is moderately distended and there may be mild gallbladder wall thickening. I do not see any definite pericholecystic inflammatory changes and no significant common bile duct dilatation. Pancreas: No mass, inflammation or ductal dilatation. Mild pancreatic atrophy. Spleen: Normal size.  No focal lesions. Adrenals/Urinary Tract: The adrenal glands and kidneys are grossly normal. No renal or obstructing ureteral calculi. No obvious renal or bladder lesions without contrast. Stomach/Bowel: The stomach, duodenum, small bowel and colon are grossly normal without oral contrast. No obvious acute inflammatory process, mass lesions or inflammatory changes. Sigmoid colon diverticulosis without findings for acute diverticulitis. Vascular/Lymphatic: Advanced atherosclerotic calcifications involving the aorta and branch vessels but no aneurysm. No mesenteric or retroperitoneal mass or adenopathy. Reproductive: The uterus is surgically absent. The left ovary is not visualized for certain. The right ovary is still present and contains a small stable simple cyst measuring 15 mm. Other: No pelvic mass or adenopathy. No free pelvic fluid collections. No inguinal mass or adenopathy. No abdominal wall hernia or subcutaneous lesions. Musculoskeletal: Stable artifact associated with a left hip hemiarthroplasty. Extensive lucency surrounding the femoral prosthesis with protrusio. This extends all the way down into the ischial tuberosity and is likely advanced particle disease. IMPRESSION: 1. No acute abdominal/pelvic findings, mass lesions or adenopathy. 2. Moderately distended gallbladder with  possible mild gallbladder wall thickening. No definite pericholecystic inflammatory changes or common bile duct dilatation. Right upper quadrant ultrasound examination may be helpful. 3. Advanced atherosclerotic calcifications involving the aorta and branch vessels. 4. Extensive lucency surrounding the left hip hemiarthroplasty with protrusion and extensive lucency surrounding the femoral prosthesis extending all the way down into the ischial tuberosity and likely advanced particle disease. Aortic Atherosclerosis (ICD10-I70.0). Electronically Signed   By: Marijo Sanes M.D.   On: 02/26/2021 12:28   NM Hepato W/EF  Result Date: 03/29/2021 CLINICAL DATA:  Abdominal pain and nausea for few days, cholecystitis EXAM: NUCLEAR MEDICINE HEPATOBILIARY IMAGING TECHNIQUE: Sequential images of the abdomen were obtained out to 60 minutes following intravenous administration of radiopharmaceutical. RADIOPHARMACEUTICALS:  5.13 mCi Tc-5m  Choletec IV COMPARISON:  CT abdomen and pelvis 03/04/2021, ultrasound abdomen RIGHT upper quadrant 03/28/2021 FINDINGS: Normal tracer clearance from bloodstream. Delayed concentration and excretion of tracer into small bowel. Small bowel visualized at 55 minutes. At 1 hour, gallbladder had not visualized. Patient then received 1.7 mg of morphine IV and imaging was continued for 30 minutes. Gallbladder failed to visualize following morphine administration. Findings are consistent with cystic duct obstruction and acute cholecystitis. IMPRESSION: Nonvisualization of the gallbladder despite morphine administration consistent with acute cholecystitis. Please note that false-positive exams can occur in patients with prolonged fasting. Electronically Signed   By: Lavonia Dana M.D.   On: 03/29/2021 12:51   DG Chest Port 1 View  Result Date: 03/31/2021 CLINICAL DATA:  Intubation EXAM: PORTABLE CHEST 1 VIEW COMPARISON:  03/30/2021 FINDINGS: Support Apparatus: --Endotracheal tube: Tip 1.5 cm above  the inferior margin of the carina. --Enteric tube:Tip and sideport project over the stomach. --Vascular catheter(s):None --Other: None The lungs are hyperinflated with diffuse interstitial prominence. No focal airspace consolidation or pulmonary edema. No pleural effusion or pneumothorax. Normal cardiomediastinal contours. There is biapical scarring. IMPRESSION: Endotracheal tube tip 1.5 cm above the inferior margin of the carina. Electronically Signed   By: Ulyses Jarred M.D.   On: 03/31/2021 03:01   DG CHEST PORT 1 VIEW  Result Date: 03/30/2021 CLINICAL DATA:  Acute respiratory failure with hypoxia EXAM: PORTABLE CHEST 1 VIEW COMPARISON:  03/12/2021 FINDINGS: Slightly increased interstitial prominence. No new consolidation. No pleural effusion. Stable cardiomediastinal contours. IMPRESSION: Slightly increased interstitial prominence since 03/26/2021 may reflect edema or atypical/viral pneumonia. Electronically Signed   By: Macy Mis M.D.   On: 03/30/2021 12:45   DG Chest Port 1 View  Result Date: 03/25/2021 CLINICAL DATA:  Fever, sepsis EXAM: PORTABLE CHEST 1 VIEW COMPARISON:  Previous studies including the examination of 02/21/2021 FINDINGS: Cardiac size is within normal limits. Low position of diaphragms suggests COPD. Increased interstitial markings in both apices suggest scarring. No new focal infiltrates are seen. There is no pleural effusion or pneumothorax. IMPRESSION: COPD. There are no signs of pulmonary edema or focal pulmonary consolidation. Electronically Signed   By: Elmer Picker M.D.   On: 03/09/2021 10:07   [place] EKG SITE RITE  Result Date: 03/30/2021 If Site Rite image not attached, placement could not be confirmed due to current cardiac rhythm.  US Abdomen Limited RUQ (LIVER/GB)  Result Date: 03/28/2021 CLINICAL DATA:  Possible gallbladder wall thickening on CT. EXAM: ULTRASOUND ABDOMEN LIMITED RIGHT UPPER QUADRANT COMPARISON:  CT abdomen pelvis from yesterday. FINDINGS:  Gallbladder: Layering sludge with mild wall thickening visualized. No sonographic Murphy sign noted by sonographer. Trace pericholecystic fluid. Common bile duct: Diameter: 3 mm, normal. Liver: No focal lesion identified. Within normal limits in parenchymal echogenicity. Portal vein is patent on color Doppler imaging with normal direction of blood flow towards the liver. Other: None. IMPRESSION: 1. Gallbladder sludge with mild wall thickening and trace pericholecystic fluid. No sonographic Murphy sign. Findings are equivocal for acute acalculous cholecystitis. Consider further evaluation with HIDA scan. Electronically Signed   By: Titus Dubin M.D.   On: 03/28/2021 12:56    Orson Eva, DO  [facility]  If 7PM-7AM, please contact night-coverage [email or web address] Password [facility] 03/31/2021, 7:24 AM  LOS: 4 days

## 2021-03-31 NOTE — Progress Notes (Signed)
Pt had a change in mentation, breath sounds  as well as vitals. Dr. Josephine Cables was called and informed of such. He came to bedside to assess the patient. It was decided to call [facility] for additional support. The physicians agreed  that this patient needed to be intubated and the daughter was called in which she stated she wanted her mother to be intubated and everything done for.  Do to the severity of this patients condition, the ER physician, Dr. Sedonia Small, was called to assist.  Pt was medicated with 10 of Etomidate @ 0229 and 100 of Succinylcholine @ 0230 followed by a NS flush. Positive color change and bilateral bs after intubation with ETT 6.5 @ 0232 measuring 22 @ lip. OG was placed at 0235 with auscultation followed by an X-Ray to verify placement of ETT and OG tube.

## 2021-03-31 NOTE — Progress Notes (Signed)
Discussed CT neck with radiology -Ill-defined soft tissue density mass encasing the trachea and right common carotid artery, possibly extending to the anterior right aspect of the esophagus, concerning for papillary thyroid carcinoma, which has increased in extent compared to 12/02/2020. There is a focus of air within the right aspect of the mass, which is in close proximity to the right common carotid which appears somewhat irregular, raising concern for carotid blowout. -daughter, Marilyn Solomon updated -she states she does not want patient to undergo any surgery or any other heroic measures at this time -She understands the risks and wants to ultimate transition patient to full comfort measures once all family have had opportunity to visit in next 24 hours.  Marilyn Solomon Marilyn Solomon

## 2021-03-31 NOTE — Progress Notes (Signed)
Critial ABG results taken from lab. MD notified. pH: 7.07 pCO2: 110

## 2021-03-31 NOTE — Consult Note (Signed)
NAME:  Marilyn Solomon, MRN:  [id number], DOB:  December 22, 1945, LOS: 4 ADMISSION DATE:  03/03/2021, CONSULTATION DATE:  03/31/21 REFERRING MD:  Tat, Triad CHIEF COMPLAINT:  acute resp failure   History of Present Illness:  75  yowf quit smoking 07/1998 GOLD IV copd on hs 02 and prn daytime as well as   hypertension, papillary thyroid cancer stage 4b and a recent admission approximately a month PTA  secondary to COVID-pneumonia; who presented to the hospital secondary to abdominal pain, nausea, vomiting and diarrhea; no overt bleeding appreciated.  Abdominal pain described as dull/crampy in nature, 7-8/10 in intensity, associated with nausea/vomiting and no aggravating factors.      ED Course: Severe SIRS/Sepsis criteria meta at time of admission with temperature of 96.6, respiratory rate of 27, soft blood pressure with a systolic blood pressure in the 80s to 90's; elevated WBCs at 16.4 and a lactic acid of 6.6; source of infection appears to be a calculus cholecystitis and organ dysfunction acute on chronic renal failure.  Fluid resuscitation following sepsis protocol provided with good improvement in patient's blood pressure and decreasing her lactic acid to 4.5.     11/2--moved to ICU due to hypotension on floor; however, BP was improved in ICU not requiring any interventions   11/3--early am--pt noted to be obtunded with stridor.  ABG 7.076/110/43 on 2L.  Hypotensive after intubation requiring levophed.  Femoral TLC place  11/3 [facility] services req am 11/3 and palliative care also involved       Significant Hospital Events: Including procedures, antibiotic start and stop dates in addition to other pertinent events   Oral ET 11/3    Scheduled Meds:  arformoterol  15 mcg Nebulization BID   chlorhexidine gluconate (MEDLINE KIT)  15 mL Mouth Rinse BID   Chlorhexidine Gluconate Cloth  6 each Topical Daily   heparin  5,000 Units Subcutaneous Q8H   ipratropium-albuterol  3 mL Nebulization Q6H    levothyroxine  75 mcg Oral Daily   mouth rinse  15 mL Mouth Rinse 10 times per day   methylPREDNISolone (SOLU-MEDROL) injection  60 mg Intravenous Q12H   pantoprazole sodium  40 mg Per Tube Daily   sodium chloride flush  10-40 mL Intracatheter Q12H   Continuous Infusions:  sodium chloride     sodium chloride     fentaNYL infusion INTRAVENOUS 125 mcg/hr (03/31/21 1032)   lactated ringers 100 mL/hr at 03/31/21 1205   meropenem (MERREM) IV 1 g (03/31/21 1207)   norepinephrine (LEVOPHED) Adult infusion 4 mcg/min (03/31/21 1032)   PRN Meds:.acetaminophen (TYLENOL) oral liquid 160 mg/5 mL, albuterol, fentaNYL, fentaNYL (SUBLIMAZE) injection, midazolam, ondansetron **OR** ondansetron (ZOFRAN) IV, sodium chloride flush    Interim History / Subjective:  Pt looks terminally ill to me/ sedated and orally intubated   Objective   Blood pressure (!) 148/74, pulse 88, temperature (!) 97.1 F (36.2 C), temperature source Rectal, resp. rate (!) 24, height 5\' 5"  (1.651 m), weight 46.7 kg, SpO2 100 %.    Vent Mode: PRVC FiO2 (%):  [40 %-50 %] 40 % Set Rate:  [15 bmp-24 bmp] 24 bmp Vt Set:  [450 mL] 450 mL PEEP:  [5 cmH20-8 cmH20] 5 cmH20 Plateau Pressure:  [18 cmH20-23 cmH20] 18 cmH20   Intake/Output Summary (Last 24 hours) at 03/31/2021 1259 Last data filed at 03/31/2021 1032 Gross per 24 hour  Intake 2397.58 ml  Output 350 ml  Net 2047.58 ml   Filed Weights   03/30/21 0552 03/30/21 1130  03/31/21 0303  Weight: 45.2 kg 46.3 kg 46.7 kg    Examination: General: acute and chronically ill appearing  HENT: oral ET/ trach scar Lungs: scattered insp/exp rhonchi, not breathing over backup rate  Cardiovascular: RRR no s3 Abdomen: abd soft/ benign Extremities: cool s edema  Neuro: sedated      I personally reviewed images and agree with radiology impression as follows:  CXR:   portable  11/3 am  The lungs are hyperinflated with diffuse interstitial prominence. No focal airspace  consolidation or pulmonary edema. No pleural effusion or pneumothorax. Normal cardiomediastinal contours. There is biapical scarring. ET ok      Assessment & Plan:  1) acute hypercarbic resp failure in setting of chronic hypoxemic resp failure a combination of copd and upper airway obstruction presumably from localized effects of thyroid cancer metastatic to lung with very poor prognosis  >>> would need trach likely to wean so unless terminal care plan hear (comfort focused) would likely need transfer to [place] as this would not be a simple trach  >>> await CT planned and outcome of palliative care eval.   2) COPD GOLD IV/ 02 dep at baseline neg response to LAMA / laba trial as outpt and likely was already approaching End stage dz even before thyroid issues above and agrees NCB in this status is approp  3) Sepsis ? Etiology, improved bp on levophed >>> would wean off asap and no escalation of care contemplated her.    Labs   CBC: Recent Labs  Lab 03/04/2021 0858 03/28/21 0438 03/29/21 1308 03/31/21 0413  WBC 16.4* 9.0 8.3 29.9*  NEUTROABS 12.3*  --   --   --   HGB 14.2 11.1* 10.4* 9.7*  HCT 45.4 35.6* 32.9* 30.7*  MCV 92.1 93.0 93.2 93.6  PLT 158 97* 123* 148*    Basic Metabolic Panel: Recent Labs  Lab 03/03/2021 0858 03/28/21 0438 03/29/21 0929 03/31/21 0413  NA 139 138 138 138  K 5.4* 5.1 4.4 4.1  CL 97* 103 104 102  CO2 $Re'23 25 22 29  'HLS$ GLUCOSE 147* 100* 71 200*  BUN 26* 27* 19 17  CREATININE 2.64* 1.85* 1.19* 0.93  CALCIUM 8.5* 7.0* 6.8* 6.9*  MG  --  1.6*  --  1.5*  PHOS  --  5.0*  --  2.8   GFR: Estimated Creatinine Clearance: 39.1 mL/min (by C-G formula based on SCr of 0.93 mg/dL). Recent Labs  Lab 03/28/2021 0858 03/10/2021 1256 03/07/2021 1525 03/28/21 0438 03/29/21 1308 03/30/21 0955 03/30/21 1244 03/31/21 0413 03/31/21 0500 03/31/21 0845  PROCALCITON  --   --   --   --   --  20.13  --   --   --  11.34  WBC 16.4*  --   --  9.0 8.3  --   --  29.9*   --   --   LATICACIDVEN 6.6*   < > 3.8*  --   --  1.8 1.2  --  2.0*  --    < > = values in this interval not displayed.    Liver Function Tests: Recent Labs  Lab 02/26/2021 0858 03/28/21 0438 03/29/21 0929 03/31/21 0413  AST 66* 47* 48* 45*  ALT $Re'20 16 17 18  'Mjl$ ALKPHOS 91 65 63 81  BILITOT 0.9 0.9 0.6 0.7  PROT 6.5 4.9* 4.9* 4.6*  ALBUMIN 2.9* 2.1* 2.1* 1.9*   No results for input(s): LIPASE, AMYLASE in the last 168 hours. No results for input(s): AMMONIA in  the last 168 hours.  ABG    Component Value Date/Time   PHART 7.349 (L) 03/31/2021 0349   PCO2ART 51.0 (H) 03/31/2021 0349   PO2ART 60.5 (L) 03/31/2021 0349   HCO3 26.2 03/31/2021 0349   TCO2 33 (H) 09/13/2020 1840   ACIDBASEDEF 1.6 03/20/2021 1256   O2SAT 95.5 03/31/2021 0349     Coagulation Profile: Recent Labs  Lab 02/28/2021 0858  INR 1.0    Cardiac Enzymes: No results for input(s): CKTOTAL, CKMB, CKMBINDEX, TROPONINI in the last 168 hours.  HbA1C: Hgb A1c MFr Bld  Date/Time Value Ref Range Status  03/31/2021 04:13 AM 5.1 4.8 - 5.6 % Final    Comment:    (NOTE) Pre diabetes:          5.7%-6.4%  Diabetes:              >6.4%  Glycemic control for   <7.0% adults with diabetes   06/03/2020 10:19 AM 6.0 (H) 4.8 - 5.6 % Final    Comment:             Prediabetes: 5.7 - 6.4          Diabetes: >6.4          Glycemic control for adults with diabetes: <7.0     CBG: Recent Labs  Lab 03/31/21 0309 03/31/21 0728  GLUCAP 161* 139*       Past Medical History:  She,  has a past medical history of Anemia, Anxiety, Arthritis, C. difficile diarrhea (10/08/2019), Chronic back pain, COPD (chronic obstructive pulmonary disease) ([place]), GERD (gastroesophageal reflux disease), HCAP (healthcare-associated pneumonia) (06/28/2017), Hypertension, Hypothyroidism, On home oxygen therapy, Pneumonia due to COVID-19 virus (09/28/2019), Reflux, and Thyroid disease.   Surgical History:   Past Surgical History:  Procedure  Laterality Date   ABDOMINAL HYSTERECTOMY     BIOPSY  10/08/2017   Procedure: BIOPSY;  Surgeon: Daneil Dolin, MD;  [facility];  Service: Endoscopy;;  gastric   cataracts Bilateral    COLONOSCOPY  2007   Dr. Gala Romney: normal rectum, diverticula    COLONOSCOPY WITH PROPOFOL N/A 11/15/2015   Dr. Gala Romney: grade II hemorrhoids, diverticulosis   COLONOSCOPY WITH PROPOFOL N/A 10/08/2017   Rourk: Diverticulosis   ESOPHAGOGASTRODUODENOSCOPY (EGD) WITH PROPOFOL N/A 10/08/2017   Rourk: Nonobstructing Schatzki ring, mild erosive reflux esophagitis, small hiatal hernia, gastritis but no H. pylori.   FRACTURE SURGERY Right 2005   Wrist   JOINT REPLACEMENT Left 2000   hip   RADICAL NECK DISSECTION Right 09/13/2020   Procedure: RADICAL NECK DISSECTION;  Surgeon: Melida Quitter, MD;  Location: [facility];  Service: ENT;  Laterality: Right;   THYROIDECTOMY N/A 09/13/2020   Procedure: TOTAL THYROIDECTOMY;  Surgeon: Melida Quitter, MD;  Location: [facility];  Service: ENT;  Laterality: N/A;   TOTAL HIP ARTHROPLASTY Left    WRIST SURGERY Right      Social History:   reports that she quit smoking about 22 years ago. Her smoking use included cigarettes. She has a 25.00 pack-year smoking history. She has never used smokeless tobacco. She reports that she does not drink alcohol and does not use drugs.   Family History:  Her family history includes Bone cancer in her sister; Dementia in her sister; Diabetes in her sister; Heart attack in her father; Pancreatic cancer in her sister; Pneumonia in her mother. There is no history of Colon cancer.   Allergies Allergies  Allergen Reactions   Penicillins Itching     Tolerated Ancef  Intraop 09/13/20   Vicodin [Hydrocodone-Acetaminophen] Nausea And Vomiting     Home Medications  Prior to Admission medications   Medication Sig Start Date End Date Taking? Authorizing Provider  albuterol (PROVENTIL) (2.5 MG/3ML) 0.083% nebulizer solution Take 3 mLs (2.5 mg total) by  nebulization every 6 (six) hours as needed for wheezing or shortness of breath. 02/22/21  Yes Emokpae, Courage, MD  albuterol (VENTOLIN HFA) 108 (90 Base) MCG/ACT inhaler Inhale 1-2 puffs into the lungs every 6 (six) hours as needed for wheezing or shortness of breath. 02/22/21  Yes Emokpae, Courage, MD  ALPRAZolam Duanne Moron) 1 MG tablet Take 1 tablet (1 mg total) by mouth every 8 (eight) hours as needed for anxiety. 01/17/21  Yes Lindell Spar, MD  levothyroxine (SYNTHROID) 75 MCG tablet TAKE ONE TABLET BY MOUTH ONCE DAILY. 03/04/21  Yes Lindell Spar, MD  magic mouthwash w/lidocaine SOLN Take 30 mLs by mouth 4 (four) times daily as needed. 03/09/21  Yes [provider]  ondansetron (ZOFRAN) 8 MG tablet Take 8 mg by mouth every 8 (eight) hours as needed for nausea/vomiting. 02/16/21  Yes [provider]  oxyCODONE (ROXICODONE) 15 MG immediate release tablet Take 1 tablet (15 mg total) by mouth every 8 (eight) hours as needed (Severe pain). 03/17/21  Yes Lindell Spar, MD  pantoprazole (PROTONIX) 40 MG tablet Take 1 tablet (40 mg total) by mouth daily. 02/22/21  Yes Emokpae, Courage, MD  Vitamin D, Ergocalciferol, (DRISDOL) 1.25 MG (50000 UNIT) CAPS capsule Take 50,000 Units by mouth every Sunday.   Yes [provider]  amLODipine (NORVASC) 5 MG tablet Take 1 tablet (5 mg total) by mouth daily. Patient not taking: No sig reported 02/28/21   Roxan Hockey, MD  guaiFENesin (MUCINEX) 600 MG 12 hr tablet Take 1 tablet (600 mg total) by mouth 2 (two) times daily. Patient not taking: No sig reported 02/22/21   Roxan Hockey, MD  promethazine-dextromethorphan (PROMETHAZINE-DM) 6.25-15 MG/5ML syrup Take 5 mLs by mouth 4 (four) times daily as needed for cough. 01/06/21   Lindell Spar, MD  Tiotropium Bromide-Olodaterol (STIOLTO RESPIMAT) 2.5-2.5 MCG/ACT AERS Inhale 2 puffs into the lungs daily. Patient not taking: No sig reported 02/22/21   Roxan Hockey, MD     The patient is  critically ill with multiple organ systems failure and requires high complexity decision making for assessment and support, frequent evaluation and titration of therapies, application of advanced monitoring technologies and extensive interpretation of multiple databases. Critical Care Time devoted to patient care services described in this note is 45  minutes.   Christinia Gully, MD Pulmonary and [place] [phone]   After 7:00 pm call Elink  [phone]

## 2021-03-31 NOTE — Progress Notes (Addendum)
Events of last night noted.  Work-up pending concerning the cause of her event.  No need for acute surgical intervention from the my standpoint.  Discussed with Dr. Carles Collet.  Patient to receive PICC line today.  We will follow peripherally with you.

## 2021-03-31 NOTE — Progress Notes (Signed)
Peripherally Inserted Central Catheter Placement  The IV Nurse has discussed with the patient and/or persons authorized to consent for the patient, the purpose of this procedure and the potential benefits and risks involved with this procedure.  The benefits include less needle sticks, lab draws from the catheter, and the patient may be discharged home with the catheter. Risks include, but not limited to, infection, bleeding, blood clot (thrombus formation), and puncture of an artery; nerve damage and irregular heartbeat and possibility to perform a PICC exchange if needed/ordered by physician.  Alternatives to this procedure were also discussed.  Bard Power PICC patient education guide, fact sheet on infection prevention and patient information card has been provided to patient /or left at bedside. Consent obtained from son [facility]) at bedside with second verify by Noel Gerold, RN  PICC Placement Documentation  PICC Triple Lumen 03/31/21 PICC Right Brachial 34 cm 0 cm (Active)  Indication for Insertion or Continuance of Line Vasoactive infusions;Prolonged intravenous therapies 03/31/21 1155  Exposed Catheter (cm) 0 cm 03/31/21 1155  Site Assessment Clean;Intact;Dry 03/31/21 1155  Lumen #1 Status Flushed;Saline locked;Blood return noted 03/31/21 1155  Lumen #2 Status Flushed;Saline locked;Blood return noted 03/31/21 1155  Lumen #3 Status Flushed;Saline locked;Blood return noted 03/31/21 1155  Dressing Type Transparent;Securing device 03/31/21 1155  Dressing Status Clean;Dry;Intact 03/31/21 1155  Antimicrobial disc in place? Yes 03/31/21 [place] checked and tightened 03/31/21 1155  Dressing Intervention New dressing 03/31/21 1155  Dressing Change Due 04/07/21 03/31/21 [place] 03/31/2021, 12:00 PM

## 2021-03-31 NOTE — Progress Notes (Signed)
Received request from patient family via NP Hulan Fray to request visitation for patient son Marilyn Solomon who is a resident of [facility]. Spoke with Marilyn Solomon and unfortunately, Marilyn Solomon cannot be transported to the hospital for visitation at this time. Marilyn Solomon did state that arrangements could be made for patient son to have a visitation at the funeral after patient passes. Information given to patient daughter Marilyn Solomon and niece. Chaplain provided support and spiritual presence today. Will continue to assess for spiritual need.   Rev. Bennie Pierini, M.Div [phone]

## 2021-03-31 NOTE — Progress Notes (Signed)
[facility] Progress Note Patient Name: Marilyn Solomon DOB: 1945/09/23 MRN: [id number]   Date of Service  03/31/2021  HPI/Events of Note  Notified of stridor.  On camera assessment, pt is audible stridor.  ABG 7.076/110/257.  eICU Interventions  ED called to intubate the patient.  Daughter made aware.      Intervention Category Major Interventions: Respiratory failure - evaluation and management  Elsie Lincoln 03/31/2021, 2:19 AM  3:33 AM  Pt on fentanyl gtt on the vent but occasionally ties to get up from the bed.   Daughter is at the bedside seemingly more open about options with regards to the care of her mother.  Plan> Give versed prn.  Need to readdress goals of care in the morning.

## 2021-03-31 NOTE — Progress Notes (Signed)
Patient moved to ICU to start levophed for soft BP's. However, upon arrival to ICU patients BP's remained stable with MAP's maintaining above 65. First three BP's on arrival were 103/50, 112/47, and 122/72.  Levophed held and Dr. Carles Collet informed that PICC insertion would be held. BP's remained stable throughout shift.

## 2021-03-31 NOTE — ED Provider Notes (Signed)
Called upstairs to the ICU for procedural assistance.  Patient with hypercapnic respiratory failure, not responding to BiPAP, losing her mental status.  Need for intubation.  This airway complicated by thyroid cancer with mass surrounding the trachea per CT scan several months ago.  She has audible stridor on exam, making this a very tenuous airway.  I had a personal discussion with patient's daughter regarding the pros and cons of this procedure and in the end daughter elected to move forward with intubation.  See procedural details below.  Difficult airway but small ET tube placed.  I then assisted medical team with central line placement, see below.  .Critical Care Performed by: Maudie Flakes, MD Authorized by: Maudie Flakes, MD   Critical care provider statement:    Critical care time (minutes):  32   Critical care time was exclusive of:  Separately billable procedures and treating other patients   Critical care was necessary to treat or prevent imminent or life-threatening deterioration of the following conditions:  Respiratory failure   Critical care was time spent personally by me on the following activities:  Examination of patient, evaluation of patient's response to treatment, discussions with consultants, blood draw for specimens, review of old charts and obtaining history from patient or surrogate Procedure Name: Intubation Date/Time: 03/31/2021 4:33 AM Performed by: Maudie Flakes, MD Pre-anesthesia Checklist: Patient identified, Patient being monitored, Emergency Drugs available, Timeout performed and Suction available Oxygen Delivery Method: Non-rebreather mask Preoxygenation: Pre-oxygenation with 100% oxygen Induction Type: Rapid sequence Ventilation: Mask ventilation without difficulty Laryngoscope Size: Glidescope and 3 Grade View: Grade I Tube size: 6.5 mm Number of attempts: 3 Airway Equipment and Method: Bougie stylet Placement Confirmation: ETT inserted through vocal  cords under direct vision, CO2 detector and Breath sounds checked- equal and bilateral Secured at: 22 cm Tube secured with: ETT holder Difficulty Due To: Difficulty was anticipated Future Recommendations: Recommend- awake intubation Comments: Grade 1 view but very limited space between the vocal cords.  Unable to pass ET tube through the cords.  Bougie used to gain entry into the trachea, which was met with resistance but successful.  Had to downsize ET tube to a 6.5 and inserted it over the bougie using Seldinger technique, which was successful.    Linus Salmons Line  Date/Time: 03/31/2021 4:35 AM Performed by: Maudie Flakes, MD Authorized by: Maudie Flakes, MD   Consent:    Consent obtained:  Emergent situation   Consent given by:  Healthcare agent   Risks, benefits, and alternatives were discussed: yes     Risks discussed:  Arterial puncture, bleeding, infection, incorrect placement, nerve damage and pneumothorax   Alternatives discussed:  No treatment Universal protocol:    Procedure explained and questions answered to patient or proxy's satisfaction: yes     Immediately prior to procedure, a time out was called: yes     Patient identity confirmed:  Arm band Pre-procedure details:    Indication(s): central venous access     Hand hygiene: Hand hygiene performed prior to insertion     Sterile barrier technique: All elements of maximal sterile technique followed     Skin preparation:  Chlorhexidine   Skin preparation agent: Skin preparation agent completely dried prior to procedure   Sedation:    Sedation type:  Deep Anesthesia:    Anesthesia method:  None Procedure details:    Location:  R femoral   Site selection rationale:  Tracheal mass   Patient position:  Supine  Procedural supplies:  Triple lumen   Catheter size:  7 Fr   Landmarks identified: yes     Ultrasound guidance: yes     Sterile ultrasound techniques: Sterile gel and sterile probe covers were used     Number  of attempts:  2   Successful placement: yes   Post-procedure details:    Post-procedure:  Dressing applied and line sutured   Assessment:  Blood return through all ports and free fluid flow   Procedure completion:  Tolerated well, no immediate complications    Maudie Flakes, MD 03/31/21 [phone]

## 2021-04-01 ENCOUNTER — Ambulatory Visit: Payer: Medicare Other | Admitting: Internal Medicine

## 2021-04-01 DIAGNOSIS — R6520 Severe sepsis without septic shock: Secondary | ICD-10-CM | POA: Diagnosis not present

## 2021-04-01 DIAGNOSIS — J690 Pneumonitis due to inhalation of food and vomit: Secondary | ICD-10-CM

## 2021-04-01 DIAGNOSIS — J9622 Acute and chronic respiratory failure with hypercapnia: Secondary | ICD-10-CM | POA: Diagnosis not present

## 2021-04-01 DIAGNOSIS — J9621 Acute and chronic respiratory failure with hypoxia: Secondary | ICD-10-CM | POA: Diagnosis not present

## 2021-04-01 DIAGNOSIS — A419 Sepsis, unspecified organism: Secondary | ICD-10-CM | POA: Diagnosis not present

## 2021-04-01 LAB — COMPREHENSIVE METABOLIC PANEL
ALT: 16 U/L (ref 0–44)
AST: 26 U/L (ref 15–41)
Albumin: 1.7 g/dL — ABNORMAL LOW (ref 3.5–5.0)
Alkaline Phosphatase: 69 U/L (ref 38–126)
Anion gap: 6 (ref 5–15)
BUN: 16 mg/dL (ref 8–23)
CO2: 28 mmol/L (ref 22–32)
Calcium: 6.6 mg/dL — ABNORMAL LOW (ref 8.9–10.3)
Chloride: 101 mmol/L (ref 98–111)
Creatinine, Ser: 0.72 mg/dL (ref 0.44–1.00)
GFR, Estimated: >60 mL/min (ref >60)
Glucose, Bld: 227 mg/dL — ABNORMAL HIGH (ref 70–99)
Potassium: 3.1 mmol/L — ABNORMAL LOW (ref 3.5–5.1)
Sodium: 135 mmol/L (ref 135–145)
Total Bilirubin: 0.3 mg/dL (ref 0.3–1.2)
Total Protein: 4.2 g/dL — ABNORMAL LOW (ref 6.5–8.1)

## 2021-04-01 LAB — CBC
HCT: 28.2 % — ABNORMAL LOW (ref 36.0–46.0)
Hemoglobin: 8.9 g/dL — ABNORMAL LOW (ref 12.0–15.0)
MCH: 28.9 pg (ref 26.0–34.0)
MCHC: 31.6 g/dL (ref 30.0–36.0)
MCV: 91.6 fL (ref 80.0–100.0)
Platelets: 122 10*3/uL — ABNORMAL LOW (ref 150–400)
RBC: 3.08 MIL/uL — ABNORMAL LOW (ref 3.87–5.11)
RDW: 21.4 % — ABNORMAL HIGH (ref 11.5–15.5)
WBC: 20 10*3/uL — ABNORMAL HIGH (ref 4.0–10.5)
nRBC: 0.1 % (ref 0.0–0.2)

## 2021-04-01 LAB — MAGNESIUM: Magnesium: 1.9 mg/dL (ref 1.7–2.4)

## 2021-04-01 LAB — URINE CULTURE: Culture: NO GROWTH

## 2021-04-01 LAB — GLUCOSE, CAPILLARY
Glucose-Capillary: 191 mg/dL — ABNORMAL HIGH (ref 70–99)
Glucose-Capillary: 206 mg/dL — ABNORMAL HIGH (ref 70–99)
Glucose-Capillary: 248 mg/dL — ABNORMAL HIGH (ref 70–99)

## 2021-04-01 LAB — CULTURE, BLOOD (SINGLE): Culture: NO GROWTH

## 2021-04-01 MED ORDER — SCOPOLAMINE 1 MG/3DAYS TD PT72
1.0000 | MEDICATED_PATCH | TRANSDERMAL | Status: DC
  Administered 2021-04-01: 1.5 mg via TRANSDERMAL
  Filled 2021-04-01 (×2): qty 1

## 2021-04-01 MED ORDER — GLYCOPYRROLATE 1 MG PO TABS: 1.0000 mg | ORAL_TABLET | Freq: Three times a day (TID) | ORAL | Status: DC

## 2021-04-01 MED ORDER — MORPHINE SULFATE (PF) 4 MG/ML IV SOLN
4.0000 mg | Freq: Once | INTRAVENOUS | Status: AC
  Administered 2021-04-01: 4 mg via INTRAVENOUS
  Filled 2021-04-01: qty 1

## 2021-04-01 MED ORDER — POTASSIUM CHLORIDE 10 MEQ/100ML IV SOLN
10.0000 meq | INTRAVENOUS | Status: AC
  Administered 2021-04-01 (×3): 10 meq via INTRAVENOUS
  Filled 2021-04-01 (×3): qty 100

## 2021-04-01 MED ORDER — MORPHINE 100MG IN NS 100ML (1MG/ML) PREMIX INFUSION
2.0000 mg/h | INTRAVENOUS | Status: DC
  Administered 2021-04-01: 2 mg/h via INTRAVENOUS
  Filled 2021-04-01: qty 100

## 2021-04-04 ENCOUNTER — Ambulatory Visit (HOSPITAL_COMMUNITY): Payer: Medicare Other | Admitting: Hematology

## 2021-04-04 LAB — CULTURE, BLOOD (ROUTINE X 2)
Culture: NO GROWTH
Culture: NO GROWTH
Special Requests: ADEQUATE

## 2021-04-07 ENCOUNTER — Telehealth: Payer: Medicare Other

## 2021-04-14 ENCOUNTER — Ambulatory Visit: Payer: Medicare Other | Admitting: "Endocrinology

## 2021-04-14 ENCOUNTER — Telehealth: Payer: Medicare Other

## 2021-04-27 DIAGNOSIS — J441 Chronic obstructive pulmonary disease with (acute) exacerbation: Secondary | ICD-10-CM | POA: Diagnosis not present

## 2021-04-28 NOTE — Progress Notes (Signed)
Patient time of death 2035 called by this RN and Tammy RN. One family member remains at bedside. MD Adefeso aware.This RN attempted to contact [name] the daughter but was unsuccessful.

## 2021-04-28 NOTE — Progress Notes (Signed)
Discussed and updated patient's son and daughter at bedside. -updated them her patient's clinical status last 24 hours -pt remains of ventilator, comfortable without distress -pt arouses to voice, but does not follow commands  I again reviewed patient's CT chest and CT neck and discussed patient's poor prognosis and unlikelihood to be extubated without needing tracheostomy.  Daughter and son state, "she would not have wanted to be like this. She would not want to be on a machine to keep her alive.  We don't want to suffer any more."  "She's been through enough already"  Daughter and son confirmed they did not want any further heroic measures.  "We just want her to be comfortable."    I discussed the concept of compassionate extubation and answered all questions.  They agreed to move forward with full comfort measures and compassionate extubation.  Orson Eva, DO

## 2021-04-28 NOTE — Progress Notes (Deleted)
Marilyn Solomon, female    DOB: [birth year],    MRN: [id number]   Brief patient profile:  49  yowf quit smoking 07/1998 at dx of L PTX which did not recur p L Chest tube not limited by breathing or coughing but around  Around 2009 with dx of copd and atypical TB by Marilyn Solomon and coughing ever since s/p admit:    Admit date: 01/22/2020 Discharge date: 01/27/2020   Admitted From: Home Disposition: Home.  Patient refused SNF placement  Home Health: Patient refused home health services Equipment/Devices: Continue supplemental oxygen.  Patient uses oxygen via nasal cannula at 2L/min at home normally.     Brief/Interim Summary: 75 year old female with COPD and chronic hypoxic respiratory failure failure on 2 L oxygen,?  MAI, hypothyroidism presented with worsening shortness of breath and cough.  On presentation, creatinine was 1.63, baseline creatinine 0.6.  Chest x-ray showed evidence of chronic changes, unable to rule out low-grade infection.  CT of the chest showed long-standing chronic bronchitis with multifocal airway collapse.   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 At the lung bases there is true consolidative opacities and acute on chronic infection is possible.  She was started on antibiotics.  Pulmonary was consulted.  Pulmonary recommended sputum AFB testing and outpatient follow-up with pulmonary along with antibiotic treatment for total of 10 days.  AFB x1 has been negative so far; second AFB test result is pending and third sputum sample will be collected prior to discharge today.  Patient is very deconditioned.  PT recommended SNF placement.  Patient refused SNF placement and is also not agreeable for home health services.  She will be discharged home with outpatient follow-up with PCP and pulmonary.     Discharge Diagnoses:    Acute kidney injury -Probably from dehydration and  poor oral intake.  Creatinine 1.63 on presentation, baseline creatinine 0.6.  Treated with IV fluids.  Creatinine 0.66 today.  No labs today.  Repeat a.m. labs. -Patient was restarted on IV fluids on 01/25/2020 evening because of very poor oral intake -Creatinine stable.  Outpatient follow-up of creatinine.   Acute on chronic hypoxic respiratory failure Probable community-acquired bacterial pneumonia Chronic lung disease/?  MAI -Started on Rocephin and Zithromax.  Subsequently switched to Levaquin 500 mg daily as per pulmonary recommendations.    Continue Levaquin for 5 more days to complete 10-day course of therapy of antibiotics as per pulmonary recommendations.  Outpatient follow-up with pulmonary. -Pulmonary recommended AFB x3 sputum  > 12/2819 grew M abscessus complex sensitive to macrolides      Acute metabolic encephalopathy -Patient was more drowsy on the evening of 01/25/2020 as per the nursing staff.  CT of the head was negative for acute intracranial abnormality.  Dose of Xanax decreased.  More awake but still very slow to respond.   -PT recommended SNF placement but patient adamant about not going to SNF and also adamant that she does not any home health services   Anxiety/depression -Patient is on very high dose of Xanax 1 mg 3 times a day as needed at home.  I have decreased it to 0.25 mg 3 times a day as needed.  Discharged home on 0.5 mg 3 times a day as needed.  Outpatient follow-up with PCP regarding follow-up of the same.   COPD -Continue home regimen. Outpatient follow-up with pulmonary   Hypothyroidism-continue levothyroxine   History of Present Illness  05/11/2020  Pulmonary/  1st office eval/ Marilyn Solomon / [facility] re f/u atypical TB  Chief Complaint  Patient presents with   Hospitalization Follow-up    Breathing has improved since hospital d/c and she states not coughing at this time. She states using o2 "at home when I need it" and also at bedtime- 2lpm.    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  Pain should improve w/in 2 weeks and if not then consider further GI work up.    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 Wheezing in the mornings when first waking up and at bedtime. Sometimes when coughing very hard sometimes blood does come up.    Dyspnea:  worse off trelegy throat still no better [facility] Baseman referred to [facility]  Cough: feels like mucus stuck in throat but none every produced just traces of brb Sleeping:45 degrees since surgery as long as sleeping like this >not needing neb at night SABA use: too much  02: 2lpm 24/7  Covid status:   vax x 3/ never had virus  Rec We will see if you qualify for POC  Zpak Prednisone 10 mg take  4 each am x 2 days,   2 each am x 2 days,  1 each am x 2 days and stop   Plan A =  Automatic = Always=    Stiolto 2 pffs each am Work on inhaler technique:    Plan B = Backup (to supplement plan A, not to replace it) Only use your albuterol inhaler as a rescue medication Plan C = Crisis (instead of Plan B but only if Plan B stops working)    April 27, 2021  f/u ov/[place] office/Zoiee Wimmer re: GOLD IV/ hypox/hypercarb maint on ***  No chief complaint on file.   Dyspnea:  *** Cough: *** Sleeping: *** SABA use: *** 02: *** Covid status: *** Lung cancer screening: ***   No obvious day to day or daytime variability or assoc excess/ purulent sputum or mucus plugs or hemoptysis or cp or chest tightness, subjective wheeze or overt sinus or hb symptoms.   *** without nocturnal  or early am exacerbation  of respiratory  c/o's or need for noct saba. Also denies any obvious fluctuation of symptoms with weather or environmental changes or other aggravating or alleviating factors except as outlined above   No unusual exposure hx or h/o childhood pna/ asthma or knowledge of premature birth.  Current Allergies, Complete Past Medical History, Past Surgical History, Family History, and Social History were reviewed in  [employer] record.  ROS  The following are not active complaints unless bolded Hoarseness, sore throat, dysphagia, dental problems, itching, sneezing,  nasal congestion or discharge of excess mucus or purulent secretions, ear ache,   fever, chills, sweats, unintended wt loss or wt gain, classically pleuritic or exertional cp,  orthopnea pnd or arm/hand swelling  or leg swelling, presyncope, palpitations, abdominal pain, anorexia, nausea, vomiting, diarrhea  or change in bowel habits or change in bladder habits, change in stools or change in urine, dysuria, hematuria,  rash, arthralgias, visual complaints, headache, numbness, weakness or ataxia or problems with walking or coordination,  change in mood or  memory.        No outpatient medications have been marked as taking for the April 27, 2021 encounter (Appointment) with Tanda Rockers, MD.                  Past Medical History:  Diagnosis Date   Anemia    Anxiety    Arthritis    Chronic back pain    COPD (chronic obstructive pulmonary disease) ([place])    GERD (gastroesophageal reflux disease)    Hypertension    Hypothyroidism    Reflux    no medications currently, asymptomatic   Thyroid disease         Objective:    27-Apr-2021         ***  01/28/2021          99  12/16/2020        102 05/11/20 106 lb 8 oz (48.3 kg)  02/10/20 106 lb (48.1 kg)  01/22/20 114 lb (51.7 kg)     Vital signs reviewed  04/27/2021  - Note at rest 02 sats  ***% on ***   General appearance:    ***   Mod bar***     Assessment

## 2021-04-28 NOTE — Progress Notes (Signed)
Wasted 51ml of fentanyl in steri-cycle bin with Pricilla Loveless. RN witness. Waste documented in pyxis.

## 2021-04-28 NOTE — Procedures (Signed)
Extubation Procedure Note [name]'s mouth and ETT were suctioned pre-extubation. She was extubated to room air. Patient Details:   Name: Marilyn Solomon DOB: 11-18-1945 MRN: [id number]   Airway Documentation:    Vent end date: 04/04/2021 Vent end time: 1836   Evaluation  O2 sats:  N/A Patient extubated to comfort care. Complications: No apparent complications Patient did tolerate procedure well. BBS diminished and clear.   No  Pete Pelt 04-Apr-2021, 7:57 PM

## 2021-04-28 NOTE — Death Summary Note (Signed)
DEATH SUMMARY   Patient Details  Name: Marilyn Solomon MRN: [id number] DOB: 1945-06-07  Admission/Discharge Information   Admit Date:  04/25/2021  Date of Death: Date of Death: 04/30/2021  Time of Death: Time of Death: 09/02/33  Length of Stay: 5  Referring Physician: Lindell Spar, MD   Reason(s) for Hospitalization  Abdominal pain, nausea, vomiting  Diagnoses  Preliminary cause of death: sepsis due to pneumonia severe sepsis: -patient met severe sepsis criteria at time of admission with elevated resp rate, soft BP, elevated WBC's, hypothermia, presume source of infection acalculous cholecystitis and renal failure for organ dysfunction. -Right upper quadrant ultrasound demonstrating sludge and mild wall thickening; no Murphy sign. -HIDA scan positive for gallbladder dysfunction; but false positive results can be appreciated in patient's with prolonged fasting. --Following general surgery recommendations, will advance diet and assess clinical response.   -11/2 AM--hypotension-->move to stepdown, start levophed -11/2--repeat blood culture, obtain UA, culture, am cortisol -11/2--check lactate 1.8>>1.2 -11/2--PCT 20.13; abx changed to [place]; -11/2 CXR--personally reviewed--no infiltrates/consolidation -11/2--started IV solumedrol -discussed with Dr. Arnoldo Morale -11/3 early am--obtunded with  ABG 7.076/110/43 on 2L>>intubated, PCCM consulted -11/3 CXR--personally reviewed--increased interstitial markings, small pleural eff -follow blood culture -now transitioned to full comfort measures   Acute kidney injury -in the setting of pre-renal azotemia and hypotension -continue to maintain adequate oral hydration now that diet will be advanced; wean off IV fluids. -baseline creatinine 0.5-0.8 -presented with serum creatinine 2.64 -follow renal function trend    Acute on chronic resp failure with hypoxia and hypercarbia -intubated 11/3 early am -due to aspiration pneumonia and stridor  due to local compression from her thyroid cancer -had dose xanax 0.5 mg on 11/2$RemoveB'@2120'JSKjlZdg$  -PCCM consult appreciated -started enteral feeding -continue IV steroids -CTA chest--no PE--Lower lobe bronchial wall thickening and distal endobronchial debris mainly in the right lower lobe; Small bilateral pleural effusions and overlying basilar atelectasis.   Aspiration Pneumonia -continued on Merrem initially -now transitioned to full comfort measures   papillary thyroid cancer stage IVb -holding chemotherapy currently in the setting of infection -follows Dr. Cyril Loosen at [facility] -on lenvatinib 18 mg daily prior to admission--started 10/1 -has RAI-resistant disease with recurrent dz only after 4 months after RAI -now has appt with Dr. Delton Coombes 04/04/21 -02/11/21 CT neck--Persistent 66 mm, predominantly necrotic mass encasing the trachea and right common carotid artery;  Multiple small hyperenhancing superior mediastinal lymph nodes, suspicious for nodal metastases -03/31/21-CT neck--Ill-defined soft tissue density mass encasing the trachea and right common carotid artery, possibly extending to the anterior right aspect of the esophagus, concerning for papillary thyroid carcinoma, which has increased in extent compared to 12/02/2020. There is a focus of air within the right aspect of the mass, which is in close proximity to the right common carotid which appears somewhat irregular, raising concern for carotid blowout. -now full comfort measures   GERD -continue PPI   nausea/Vomiting/Diarrhea -in the setting of problem #1 -continue supportive care, antiemetics and analgesics -C. Diff neg -Provide as needed loperamide. -resolved   HTN -holding amlodipine due to hypotension   hypothyroidism -continue synthroid    severe protein calorie malnutrition  -Body mass index is 15.78 kg/m. -feeding supplements and nutritional service will be pursuit.   stage II pressure injury -localized in  his sacrum, POA -no signs of superimposed infection -continue barrier therapy and constant repositioning   hypomagnesemia -repleted  Brief Hospital Course (including significant findings, care, treatment, and services provided and events leading to death)  Marilyn Solomon is  a 75 y.o. female with medical history significant of thyroid disease, gastroesophageal reflux disease, hypertension, COPD, chronic respiratory failure with hypoxia (using 2 L nasal cannula supplementation at baseline), papillary thyroid cancer stage 4b and a recent admission approximately a month ago secondary to COVID-pneumonia; who presented to the hospital secondary to abdominal pain, nausea, vomiting and diarrhea; no overt bleeding appreciated.  Abdominal pain described as dull/crampy in nature, 7-8/10 in intensity, associated with nausea/vomiting and no aggravating factors.  Her symptoms have been present for the last 2-3 days and worsening.  She reports decreased oral intake.  Patient also reports chills but never measured temperature at home.  No chest pain, no focal weaknesses, no dysuria or hematuria, no other complaints.   Patient vaccinated against COVID; no booster.   ED Course: Severe SIRS/Sepsis criteria meta at time of admission with temperature of 96.6, respiratory rate of 27, soft blood pressure with a systolic blood pressure in the 80s to 90's; elevated WBCs at 16.4 and a lactic acid of 6.6; source of infection appears to be a calculus cholecystitis and organ dysfunction acute on chronic renal failure.  Fluid resuscitation following sepsis protocol provided with good improvement in patient's blood pressure and decreasing her lactic acid to 4.5.  Cultures were taken and patient has been started on broad-spectrum antibiotics.   11/2--moved to ICU due to hypotension on floor; however, BP was improved in ICU not requiring any interventions   11/3--early am--pt noted to be obtunded with stridor.  ABG 7.076/110/43  on 2L.  Hypotensive after intubation requiring levophed.  Femoral TLC place.  PICC line placed and CTA chest and neck ordered.  [place] with daughter and son--want to pursue full comfort measures after all family has visited   11/4--remains of ventilator and levophed.  [place] discussion again with family.  Agree with full comfort measures.  Started morphine drip. Discussed and updated patient's son and daughter at bedside. -updated them her patient's clinical status last 24 hours -pt remains of ventilator, comfortable without distress -pt arouses to voice, but does not follow commands   I again reviewed patient's CT chest and CT neck and discussed patient's poor prognosis and unlikelihood to be extubated without needing tracheostomy.   Daughter and son state, "she would not have wanted to be like this. She would not want to be on a machine to keep her alive.  We don't want to suffer any more."  "She's been through enough already"   Daughter and son confirmed they did not want any further heroic measures.  "We just want her to be comfortable."    I discussed the concept of compassionate extubation and answered all questions.  They agreed to move forward with full comfort measures and compassionate extubation.    Pertinent Labs and Studies  Significant Diagnostic Studies CT ABDOMEN PELVIS WO CONTRAST  Result Date: 03/23/2021 CLINICAL DATA:  Left upper quadrant abdominal pain and sepsis. EXAM: CT ABDOMEN AND PELVIS WITHOUT CONTRAST TECHNIQUE: Multidetector CT imaging of the abdomen and pelvis was performed following the standard protocol without IV contrast. COMPARISON:  CT scan 10/07/2019 FINDINGS: Lower chest: Emphysematous changes and pulmonary scarring. Moderate peripheral peribronchial thickening. No focal infiltrates or pleural effusions. The heart is normal in size. No pericardial effusion. Stable aortic and coronary artery calcifications. Hepatobiliary: No obvious hepatic lesions without contrast.  Gallbladder is moderately distended and there may be mild gallbladder wall thickening. I do not see any definite pericholecystic inflammatory changes and no significant common bile duct dilatation. Pancreas: No  mass, inflammation or ductal dilatation. Mild pancreatic atrophy. Spleen: Normal size.  No focal lesions. Adrenals/Urinary Tract: The adrenal glands and kidneys are grossly normal. No renal or obstructing ureteral calculi. No obvious renal or bladder lesions without contrast. Stomach/Bowel: The stomach, duodenum, small bowel and colon are grossly normal without oral contrast. No obvious acute inflammatory process, mass lesions or inflammatory changes. Sigmoid colon diverticulosis without findings for acute diverticulitis. Vascular/Lymphatic: Advanced atherosclerotic calcifications involving the aorta and branch vessels but no aneurysm. No mesenteric or retroperitoneal mass or adenopathy. Reproductive: The uterus is surgically absent. The left ovary is not visualized for certain. The right ovary is still present and contains a small stable simple cyst measuring 15 mm. Other: No pelvic mass or adenopathy. No free pelvic fluid collections. No inguinal mass or adenopathy. No abdominal wall hernia or subcutaneous lesions. Musculoskeletal: Stable artifact associated with a left hip hemiarthroplasty. Extensive lucency surrounding the femoral prosthesis with protrusio. This extends all the way down into the ischial tuberosity and is likely advanced particle disease. IMPRESSION: 1. No acute abdominal/pelvic findings, mass lesions or adenopathy. 2. Moderately distended gallbladder with possible mild gallbladder wall thickening. No definite pericholecystic inflammatory changes or common bile duct dilatation. Right upper quadrant ultrasound examination may be helpful. 3. Advanced atherosclerotic calcifications involving the aorta and branch vessels. 4. Extensive lucency surrounding the left hip hemiarthroplasty with  protrusion and extensive lucency surrounding the femoral prosthesis extending all the way down into the ischial tuberosity and likely advanced particle disease. Aortic Atherosclerosis (ICD10-I70.0). Electronically Signed   By: Marijo Sanes M.D.   On: 03/06/2021 12:28   CT SOFT TISSUE NECK W CONTRAST  Result Date: 03/31/2021 CLINICAL DATA:  Thyroid cancer, surveillance EXAM: CT NECK WITH CONTRAST TECHNIQUE: Multidetector CT imaging of the neck was performed using the standard protocol following the bolus administration of intravenous contrast. CONTRAST:  156mL OMNIPAQUE IOHEXOL 350 MG/ML SOLN COMPARISON:  08/05/2020, 12/02/2020 PET-CT FINDINGS: Pharynx and larynx: The patient is intubated, with a gastric tube and endotracheal tube seen in the pharynx, limiting evaluation. No definite mass or swelling. The endotracheal tube extends off the inferior field of view in the right mainstem bronchus. The gastric tube extends off the inferior field of view in the esophagus. Salivary glands: No inflammation, mass, or stone. Thyroid: Status post thyroidectomy. Ill-defined soft tissue density mass encasing the trachea and right common carotid artery (series 12, image 60), possibly extending to the anterior right aspect of the esophagus, measuring approximately 4.6 x 4.3 cm in the axial plane, previously approximately 5.6 x 2.9 cm when remeasured similarly 12/02/2020. The mass extends superiorly along the course of the right common carotid (series 12, image 48), poorly defined. A focus of air is seen within the right aspect of the lesion, which is new compared to 12/02/2020 and may represent a laryngocele, necrosis, or less likely infection with gas-forming organism. This area is in close proximity to the right common carotid artery, without a definite fat plane. Lymph nodes: None enlarged or abnormal density. Vascular: In case mint of the right common carotid artery by the ill-defined soft tissue mass in the right neck,  with mild irregularity of the vessel adjacent to a focus of air. Vasculature is otherwise patent. Limited intracranial: Negative. Previously described lesion along the right sphenoid a and right parietal convexity are not included in the field of view. Visualized orbits: Not included in the field of view. Mastoids and visualized paranasal sinuses: Mild mucosal thickening in the right maxillary sinus. The mastoids  are well aerated. Skeleton: No acute osseous abnormality. Upper chest: Please see same-day CT chest Other: None. IMPRESSION: 1. Ill-defined soft tissue density mass encasing the trachea and right common carotid artery, possibly extending to the anterior right aspect of the esophagus, concerning for papillary thyroid carcinoma, which has increased in extent compared to 12/02/2020. There is a focus of air within the right aspect of the mass, which is in close proximity to the right common carotid which appears somewhat irregular, raising concern for carotid blowout. 2. No lymphadenopathy. 3.  For findings in the thorax, please see same day CT chest. These results were called by telephone at the time of interpretation on 03/31/2021 at 4:26 pm to provider Other Atienza , who verbally acknowledged these results. Electronically Signed   By: Merilyn Baba M.D.   On: 03/31/2021 16:29   CT Angio Chest Pulmonary Embolism (PE) W or WO Contrast  Result Date: 03/31/2021 CLINICAL DATA:  Chronic respiratory failure. Recent COVID pneumonia. Hypoxic and septic. EXAM: CT ANGIOGRAPHY CHEST WITH CONTRAST TECHNIQUE: Multidetector CT imaging of the chest was performed using the standard protocol during bolus administration of intravenous contrast. Multiplanar CT image reconstructions and MIPs were obtained to evaluate the vascular anatomy. CONTRAST:  146mL OMNIPAQUE IOHEXOL 350 MG/ML SOLN COMPARISON:  01/23/2020 FINDINGS: Cardiovascular: The heart is normal in size. No pericardial effusion. The aorta demonstrates mild tortuosity  and moderate atherosclerotic calcification but no focal aneurysm dissection. The branch vessels are patent. Stable three-vessel coronary artery calcifications. The pulmonary arterial tree is well opacified. No filling defects to suggest pulmonary embolism. Mediastinum/Nodes: No mediastinal or hilar mass or lymphadenopathy. Small amount of fluid in the pericardial recesses. There is an NG tube coursing down the esophagus and into the stomach. The endotracheal tube is in the right mainstem bronchus and should be retracted at least 3 cm. Lungs/Pleura: Chronic emphysematous and bronchitic lung changes with probable post pneumonic scarring changes also. Some residual inflammatory changes mainly in the right middle and upper lobes. Lower lobe bronchial wall thickening and distal endobronchial debris mainly in the right lower lobe. Aspiration would certainly be a consideration. There are small bilateral pleural effusions and overlying basilar atelectasis. Upper Abdomen: No significant upper abdominal findings. Free fluid is noted around the spleen. Aortic calcifications. Musculoskeletal: No significant bony findings. Review of the MIP images confirms the above findings. IMPRESSION: 1. No CT findings for pulmonary embolism. 2. Chronic emphysematous and bronchitic lung changes with probable post pneumonic scarring changes. 3. Some residual inflammatory changes mainly in the right middle and upper lobes. 4. Lower lobe bronchial wall thickening and distal endobronchial debris mainly in the right lower lobe. Aspiration would certainly be a consideration. 5. Small bilateral pleural effusions and overlying basilar atelectasis. 6. The endotracheal tube is in the right mainstem bronchus and should be retracted at least 3 cm. 7. Aortic atherosclerosis. Aortic Atherosclerosis (ICD10-I70.0) and Emphysema (ICD10-J43.9). These results will be called to the ordering clinician or representative by the Radiologist Assistant, and  communication documented in the PACS or [employer]. Electronically Signed   By: Marijo Sanes M.D.   On: 03/31/2021 14:54   NM Hepato W/EF  Result Date: 03/29/2021 CLINICAL DATA:  Abdominal pain and nausea for few days, cholecystitis EXAM: NUCLEAR MEDICINE HEPATOBILIARY IMAGING TECHNIQUE: Sequential images of the abdomen were obtained out to 60 minutes following intravenous administration of radiopharmaceutical. RADIOPHARMACEUTICALS:  5.13 mCi Tc-67m  Choletec IV COMPARISON:  CT abdomen and pelvis 02/26/2021, ultrasound abdomen RIGHT upper quadrant 03/28/2021 FINDINGS: Normal tracer  clearance from bloodstream. Delayed concentration and excretion of tracer into small bowel. Small bowel visualized at 55 minutes. At 1 hour, gallbladder had not visualized. Patient then received 1.7 mg of morphine IV and imaging was continued for 30 minutes. Gallbladder failed to visualize following morphine administration. Findings are consistent with cystic duct obstruction and acute cholecystitis. IMPRESSION: Nonvisualization of the gallbladder despite morphine administration consistent with acute cholecystitis. Please note that false-positive exams can occur in patients with prolonged fasting. Electronically Signed   By: Lavonia Dana M.D.   On: 03/29/2021 12:51   DG Chest Port 1 View  Result Date: 03/31/2021 CLINICAL DATA:  Intubation EXAM: PORTABLE CHEST 1 VIEW COMPARISON:  03/30/2021 FINDINGS: Support Apparatus: --Endotracheal tube: Tip 1.5 cm above the inferior margin of the carina. --Enteric tube:Tip and sideport project over the stomach. --Vascular catheter(s):None --Other: None The lungs are hyperinflated with diffuse interstitial prominence. No focal airspace consolidation or pulmonary edema. No pleural effusion or pneumothorax. Normal cardiomediastinal contours. There is biapical scarring. IMPRESSION: Endotracheal tube tip 1.5 cm above the inferior margin of the carina. Electronically Signed   By: Ulyses Jarred  M.D.   On: 03/31/2021 03:01   DG CHEST PORT 1 VIEW  Result Date: 03/30/2021 CLINICAL DATA:  Acute respiratory failure with hypoxia EXAM: PORTABLE CHEST 1 VIEW COMPARISON:  03/11/2021 FINDINGS: Slightly increased interstitial prominence. No new consolidation. No pleural effusion. Stable cardiomediastinal contours. IMPRESSION: Slightly increased interstitial prominence since 03/18/2021 may reflect edema or atypical/viral pneumonia. Electronically Signed   By: Macy Mis M.D.   On: 03/30/2021 12:45   DG Chest Port 1 View  Result Date: 03/22/2021 CLINICAL DATA:  Fever, sepsis EXAM: PORTABLE CHEST 1 VIEW COMPARISON:  Previous studies including the examination of 02/21/2021 FINDINGS: Cardiac size is within normal limits. Low position of diaphragms suggests COPD. Increased interstitial markings in both apices suggest scarring. No new focal infiltrates are seen. There is no pleural effusion or pneumothorax. IMPRESSION: COPD. There are no signs of pulmonary edema or focal pulmonary consolidation. Electronically Signed   By: Elmer Picker M.D.   On: 03/22/2021 10:07   [place] EKG SITE RITE  Result Date: 03/31/2021 If Site Rite image not attached, placement could not be confirmed due to current cardiac rhythm.  [place] EKG SITE RITE  Result Date: 03/30/2021 If Site Rite image not attached, placement could not be confirmed due to current cardiac rhythm.  US Abdomen Limited RUQ (LIVER/GB)  Result Date: 03/28/2021 CLINICAL DATA:  Possible gallbladder wall thickening on CT. EXAM: ULTRASOUND ABDOMEN LIMITED RIGHT UPPER QUADRANT COMPARISON:  CT abdomen pelvis from yesterday. FINDINGS: Gallbladder: Layering sludge with mild wall thickening visualized. No sonographic Murphy sign noted by sonographer. Trace pericholecystic fluid. Common bile duct: Diameter: 3 mm, normal. Liver: No focal lesion identified. Within normal limits in parenchymal echogenicity. Portal vein is patent on color Doppler imaging with normal  direction of blood flow towards the liver. Other: None. IMPRESSION: 1. Gallbladder sludge with mild wall thickening and trace pericholecystic fluid. No sonographic Murphy sign. Findings are equivocal for acute acalculous cholecystitis. Consider further evaluation with HIDA scan. Electronically Signed   By: Titus Dubin M.D.   On: 03/28/2021 12:56    Microbiology Recent Results (from the past 240 hour(s))  Urine Culture     Status: None   Collection Time: 03/18/2021  5:25 AM   Specimen: In/Out Cath Urine  Result Value Ref Range Status   Specimen Description   Final    IN/OUT CATH URINE Performed at [facility]  [facility], [address] [place], [place] [address]    Special Requests   Final    NONE Performed at [facility], [address] [place], [place] [address]    Culture   Final    NO GROWTH Performed at [facility], [place] [address] [place], [place] [address]    Report Status 03/30/2021 FINAL  Final  Blood culture (routine single)     Status: None   Collection Time: 03/23/2021  8:59 AM   Specimen: BLOOD  Result Value Ref Range Status   Specimen Description BLOOD  Final   Special Requests NONE  Final   Culture   Final    NO GROWTH 5 DAYS Performed at [facility], [address] [place], [place] [address]    Report Status 04-12-21 FINAL  Final  Resp Panel by RT-PCR (Flu A&B, Covid) Nasopharyngeal Swab     Status: None   Collection Time: 03/23/2021  6:36 PM   Specimen: Nasopharyngeal Swab; Nasopharyngeal(NP) swabs in vial transport medium  Result Value Ref Range Status   SARS Coronavirus 2 by RT PCR NEGATIVE NEGATIVE Final    Comment: (NOTE) SARS-CoV-2 target nucleic acids are NOT DETECTED.  The SARS-CoV-2 RNA is generally detectable in upper respiratory specimens during the acute phase of infection. The lowest concentration of SARS-CoV-2 viral copies this assay can detect is 138 copies/mL. A negative result does not preclude SARS-Cov-2 infection and should not be used as  the sole basis for treatment or other patient management decisions. A negative result may occur with  improper specimen collection/handling, submission of specimen other than nasopharyngeal swab, presence of viral mutation(s) within the areas targeted by this assay, and inadequate number of viral copies(<138 copies/mL). A negative result must be combined with clinical observations, patient history, and epidemiological information. The expected result is Negative.  Fact Sheet for Patients:  EntrepreneurPulse.com.au  Fact Sheet for Healthcare Providers:  IncredibleEmployment.be  This test is no t yet approved or cleared by the [place] FDA and  has been authorized for detection and/or diagnosis of SARS-CoV-2 by FDA under an Emergency Use Authorization (EUA). This EUA will remain  in effect (meaning this test can be used) for the duration of the COVID-19 declaration under Section 564(b)(1) of the Act, 21 U.S.C.section 360bbb-3(b)(1), unless the authorization is terminated  or revoked sooner.       Influenza A by PCR NEGATIVE NEGATIVE Final   Influenza B by PCR NEGATIVE NEGATIVE Final    Comment: (NOTE) The Xpert Xpress SARS-CoV-2/FLU/RSV plus assay is intended as an aid in the diagnosis of influenza from Nasopharyngeal swab specimens and should not be used as a sole basis for treatment. Nasal washings and aspirates are unacceptable for Xpert Xpress SARS-CoV-2/FLU/RSV testing.  Fact Sheet for Patients: EntrepreneurPulse.com.au  Fact Sheet for Healthcare Providers: IncredibleEmployment.be  This test is not yet approved or cleared by the [place] FDA and has been authorized for detection and/or diagnosis of SARS-CoV-2 by FDA under an Emergency Use Authorization (EUA). This EUA will remain in effect (meaning this test can be used) for the duration of the COVID-19 declaration under Section 564(b)(1) of  the Act, 21 U.S.C. section 360bbb-3(b)(1), unless the authorization is terminated or revoked.  Performed at [facility], [address], [place], [place] [address]   C Difficile Quick Screen w PCR reflex     Status: None   Collection Time: 03/10/2021 10:07 PM   Specimen: STOOL  Result Value Ref Range Status   C Diff  antigen NEGATIVE NEGATIVE Final   C Diff toxin NEGATIVE NEGATIVE Final   C Diff interpretation No C. difficile detected.  Final    Comment: Performed at [facility], [address] [place], [place] [address]  MRSA Next Gen by PCR, Nasal     Status: None   Collection Time: 03/28/21  5:45 AM   Specimen: Nasal Mucosa; Nasal Swab  Result Value Ref Range Status   MRSA by PCR Next Gen NOT DETECTED NOT DETECTED Final    Comment: (NOTE) The GeneXpert MRSA Assay (FDA approved for NASAL specimens only), is one component of a comprehensive MRSA colonization surveillance program. It is not intended to diagnose MRSA infection nor to guide or monitor treatment for MRSA infections. Test performance is not FDA approved in patients less than 64 years old. Performed at [facility], [address] [place], [place] [address]   Culture, blood (Routine X 2) w Reflex to ID Panel     Status: None (Preliminary result)   Collection Time: 03/30/21  9:55 AM   Specimen: BLOOD LEFT ARM  Result Value Ref Range Status   Specimen Description BLOOD LEFT ARM  Final   Special Requests   Final    BOTTLES DRAWN AEROBIC AND ANAEROBIC Blood Culture adequate volume   Culture   Final    NO GROWTH 3 DAYS Performed at [facility], [address], [place], [place] [address]    Report Status PENDING  Incomplete  Culture, blood (Routine X 2) w Reflex to ID Panel     Status: None (Preliminary result)   Collection Time: 03/30/21  9:56 AM   Specimen: BLOOD RIGHT HAND  Result Value Ref Range Status   Specimen Description BLOOD RIGHT HAND  Final   Special Requests   Final    BOTTLES DRAWN AEROBIC AND ANAEROBIC  Blood Culture results may not be optimal due to an inadequate volume of blood received in culture bottles   Culture   Final    NO GROWTH 3 DAYS Performed at [facility], [address], [place], [place] [address]    Report Status PENDING  Incomplete  MRSA Next Gen by PCR, Nasal     Status: None   Collection Time: 03/30/21 11:11 AM   Specimen: Nasal Mucosa; Nasal Swab  Result Value Ref Range Status   MRSA by PCR Next Gen NOT DETECTED NOT DETECTED Final    Comment: (NOTE) The GeneXpert MRSA Assay (FDA approved for NASAL specimens only), is one component of a comprehensive MRSA colonization surveillance program. It is not intended to diagnose MRSA infection nor to guide or monitor treatment for MRSA infections. Test performance is not FDA approved in patients less than 14 years old. Performed at [facility], [address] [place], [place] [address]   Urine Culture     Status: None   Collection Time: 03/31/21  8:41 AM   Specimen: Urine, Catheterized  Result Value Ref Range Status   Specimen Description   Final    URINE, CATHETERIZED Performed at [facility], [address], [place], [place] [address]    Special Requests   Final    NONE Performed at [facility], [address], [place], [place] [address]    Culture   Final    NO GROWTH Performed at [facility], [place] [address], [place],  [address]    Report Status April 11, 2021 FINAL  Final    Lab Basic Metabolic Panel: Recent Labs  Lab 03/28/21 0438 03/29/21 0929 03/31/21  0413 24-Apr-2021 0332  NA 138 138 138 135  K 5.1 4.4 4.1 3.1*  CL 103 104 102 101  CO2 $Re'25 22 29 28  'bBF$ GLUCOSE 100* 71 200* 227*  BUN 27* $Remov'19 17 16  'ajxeey$ CREATININE 1.85* 1.19* 0.93 0.72  CALCIUM 7.0* 6.8* 6.9* 6.6*  MG 1.6*  --  1.5* 1.9  PHOS 5.0*  --  2.8  --    Liver Function Tests: Recent Labs  Lab 03/28/21 0438 03/29/21 0929 03/31/21 0413 Apr 24, 2021 0332  AST 47* 48* 45* 26  ALT $Re'16 17 18 16  'MEG$ ALKPHOS 65 63 81 69  BILITOT 0.9 0.6 0.7  0.3  PROT 4.9* 4.9* 4.6* 4.2*  ALBUMIN 2.1* 2.1* 1.9* 1.7*   No results for input(s): LIPASE, AMYLASE in the last 168 hours. No results for input(s): AMMONIA in the last 168 hours. CBC: Recent Labs  Lab 03/28/21 0438 03/29/21 1308 03/31/21 0413 04-24-21 0332  WBC 9.0 8.3 29.9* 20.0*  HGB 11.1* 10.4* 9.7* 8.9*  HCT 35.6* 32.9* 30.7* 28.2*  MCV 93.0 93.2 93.6 91.6  PLT 97* 123* 148* 122*   Cardiac Enzymes: No results for input(s): CKTOTAL, CKMB, CKMBINDEX, TROPONINI in the last 168 hours. Sepsis Labs: Recent Labs  Lab 03/28/21 0438 03/29/21 1308 03/30/21 0955 03/30/21 1244 03/31/21 0413 03/31/21 0500 03/31/21 0845 April 24, 2021 0332  PROCALCITON  --   --  20.13  --   --   --  11.34  --   WBC 9.0 8.3  --   --  29.9*  --   --  20.0*  LATICACIDVEN  --   --  1.8 1.2  --  2.0*  --   --     Procedures/Operations  PICC line 11/3   Annalysse Shoemaker 04/03/2021, 4:39 PM

## 2021-04-28 NOTE — Progress Notes (Addendum)
Patient status changed to Comfort care per family and IV morphine started per orders. Emotional support and education provided to family/visitors. Chaplain called per [facility].

## 2021-04-28 NOTE — Progress Notes (Signed)
PROGRESS NOTE  Marilyn Solomon MWN:[id number] DOB: 1945/11/20 DOA: 03/10/2021 PCP: Lindell Spar, MD  Brief History:  75 y.o. female with medical history significant of thyroid disease, gastroesophageal reflux disease, hypertension, COPD, chronic respiratory failure with hypoxia (using 2 L nasal cannula supplementation at baseline), papillary thyroid cancer stage 4b and a recent admission approximately a month ago secondary to COVID-pneumonia; who presented to the hospital secondary to abdominal pain, nausea, vomiting and diarrhea; no overt bleeding appreciated.  Abdominal pain described as dull/crampy in nature, 7-8/10 in intensity, associated with nausea/vomiting and no aggravating factors.  Her symptoms have been present for the last 2-3 days and worsening.  She reports decreased oral intake.  Patient also reports chills but never measured temperature at home.  No chest pain, no focal weaknesses, no dysuria or hematuria, no other complaints.   Patient vaccinated against COVID; no booster.   ED Course: Severe SIRS/Sepsis criteria meta at time of admission with temperature of 96.6, respiratory rate of 27, soft blood pressure with a systolic blood pressure in the 80s to 90's; elevated WBCs at 16.4 and a lactic acid of 6.6; source of infection appears to be a calculus cholecystitis and organ dysfunction acute on chronic renal failure.  Fluid resuscitation following sepsis protocol provided with good improvement in patient's blood pressure and decreasing her lactic acid to 4.5.  Cultures were taken and patient has been started on broad-spectrum antibiotics.   11/2--moved to ICU due to hypotension on floor; however, BP was improved in ICU not requiring any interventions   11/3--early am--pt noted to be obtunded with stridor.  ABG 7.076/110/43 on 2L.  Hypotensive after intubation requiring levophed.  Femoral TLC place.  PICC line placed and CTA chest and neck ordered.  [place] with daughter and  son--want to pursue full comfort measures after all family has visited  11/4--remains of ventilator and levophed.  [place] discussion again with family.  Agree with full comfort measures.  Started morphine drip. Discussed and updated patient's son and daughter at bedside. -updated them her patient's clinical status last 24 hours -pt remains of ventilator, comfortable without distress -pt arouses to voice, but does not follow commands   I again reviewed patient's CT chest and CT neck and discussed patient's poor prognosis and unlikelihood to be extubated without needing tracheostomy.   Daughter and son state, "she would not have wanted to be like this. She would not want to be on a machine to keep her alive.  We don't want to suffer any more."  "She's been through enough already"   Daughter and son confirmed they did not want any further heroic measures.  "We just want her to be comfortable."    I discussed the concept of compassionate extubation and answered all questions.  They agreed to move forward with full comfort measures and compassionate extubation.  Assessment/Plan: severe sepsis: -patient met severe sepsis criteria at time of admission with elevated resp rate, soft BP, elevated WBC's, hypothermia, presume source of infection acalculous cholecystitis and renal failure for organ dysfunction. -Right upper quadrant ultrasound demonstrating sludge and mild wall thickening; no Murphy sign. -HIDA scan positive for gallbladder dysfunction; but false positive results can be appreciated in patient's with prolonged fasting. --Following general surgery recommendations, will advance diet and assess clinical response.   -11/2 AM--hypotension-->move to stepdown, start levophed -11/2--repeat blood culture, obtain UA, culture, am cortisol -11/2--check lactate 1.8>>1.2 -11/2--PCT 20.13; abx changed to [place]; -11/2 CXR--personally reviewed--no  infiltrates/consolidation -11/2--started IV  solumedrol -discussed with Dr. Arnoldo Morale -11/3 early am--obtunded with  ABG 7.076/110/43 on 2L>>intubated, PCCM consulted -11/3 CXR--personally reviewed--increased interstitial markings, small pleural eff -follow blood culture -now transitioned to full comfort measures   Acute kidney injury -in the setting of pre-renal azotemia and hypotension -continue to maintain adequate oral hydration now that diet will be advanced; wean off IV fluids. -baseline creatinine 0.5-0.8 -presented with serum creatinine 2.64 -follow renal function trend    Acute on chronic resp failure with hypoxia and hypercarbia -intubated 11/3 early am -due to aspiration pneumonia and stridor due to local compression from her thyroid cancer -had dose xanax 0.5 mg on 11/2_0  -PCCM consult appreciated -started enteral feeding -continue IV steroids -CTA chest--no PE--Lower lobe bronchial wall thickening and distal endobronchial debris mainly in the right lower lobe; Small bilateral pleural effusions and overlying basilar atelectasis.  Aspiration Pneumonia -continued on Merrem initially -now transitioned to full comfort measures   papillary thyroid cancer stage IVb -holding chemotherapy currently in the setting of infection -follows Dr. Cyril Loosen at [facility] -on lenvatinib 18 mg daily prior to admission--started 10/1 -has RAI-resistant disease with recurrent dz only after 4 months after RAI -now has appt with Dr. Delton Coombes 04/04/21 -02/11/21 CT neck--Persistent 66 mm, predominantly necrotic mass encasing the trachea and right common carotid artery;  Multiple small hyperenhancing superior mediastinal lymph nodes, suspicious for nodal metastases -03/31/21-CT neck--Ill-defined soft tissue density mass encasing the trachea and right common carotid artery, possibly extending to the anterior right aspect of the esophagus, concerning for papillary thyroid carcinoma, which has increased in extent compared to  12/02/2020. There is a focus of air within the right aspect of the mass, which is in close proximity to the right common carotid which appears somewhat irregular, raising concern for carotid blowout. -now full comfort measures   GERD -continue PPI   nausea/Vomiting/Diarrhea -in the setting of problem #1 -continue supportive care, antiemetics and analgesics -C. Diff neg -Provide as needed loperamide. -resolved   HTN -holding amlodipine due to hypotension   hypothyroidism -continue synthroid    severe protein calorie malnutrition  -Body mass index is 15.78 kg/m. -feeding supplements and nutritional service will be pursuit.   stage II pressure injury -localized in his sacrum, POA -no signs of superimposed infection -continue barrier therapy and constant repositioning   hypomagnesemia -repleted   The patient is critically ill with multiple organ systems failure and requires high complexity decision making for assessment and support, frequent evaluation and titration of therapies, application of advanced monitoring technologies and extensive interpretation of multiple databases.  Critical care time - 40 mins.      Status is: Inpatient   Remains inpatient appropriate because: hemodynamic instability and requiring mechanical ventilation               Family Communication:  no Family at bedside   Consultants:  general surgery; PCCM   Code Status:  DNR   DVT Prophylaxis:  [place] Heparin      Procedures: As Listed in Progress Note Above   Antibiotics: Cefepime 10/30>>11/2 Merrem 11/2>>            Subjective: Patient intubated and sedated.  ROS not possible  Objective: Vitals:   Apr 27, 2021 1215 2021/04/27 1245 April 27, 2021 1300 2021-04-27 1502  BP:  126/67 122/73   Pulse:  (!) 109 (!) 110   Resp:  (!) 24 (!) 24   Temp:   98.5 F (36.9 C)   TempSrc:   Rectal   SpO2:  100% 100% 100% 100%  Weight:      Height:        Intake/Output Summary (Last 24 hours)  at Apr 15, 2021 1634 Last data filed at Apr 15, 2021 1523 Gross per 24 hour  Intake 3620.69 ml  Output 950 ml  Net 2670.69 ml   Weight change: 5 kg Exam:  General:  Pt is intubated and sedated HEENT: No icterus, No thrush, No neck mass, [place]/AT Cardiovascular: RRR, S1/S2, no rubs, no gallops Respiratory: scattered bilateral rales. No wheeze Abdomen: Soft/+BS, non tender, non distended, no guarding Extremities: No edema, No lymphangitis, No petechiae, No rashes, no synovitis   Data Reviewed: I have personally reviewed following labs and imaging studies Basic Metabolic Panel: Recent Labs  Lab 03/28/2021 0858 03/28/21 0438 03/29/21 0929 03/31/21 0413 2021/04/15 0332  NA 139 138 138 138 135  K 5.4* 5.1 4.4 4.1 3.1*  CL 97* 103 104 102 101  CO2 _0 GLUCOSE 147* 100* 71 200* 227*  BUN 26* 27* _1 CREATININE 2.64* 1.85* 1.19* 0.93 0.72  CALCIUM 8.5* 7.0* 6.8* 6.9* 6.6*  MG  --  1.6*  --  1.5* 1.9  PHOS  --  5.0*  --  2.8  --    Liver Function Tests: Recent Labs  Lab 03/15/2021 0858 03/28/21 0438 03/29/21 0929 03/31/21 0413 04-15-21 0332  AST 66* 47* 48* 45* 26  ALT _2 ALKPHOS 91 65 63 81 69  BILITOT 0.9 0.9 0.6 0.7 0.3  PROT 6.5 4.9* 4.9* 4.6* 4.2*  ALBUMIN 2.9* 2.1* 2.1* 1.9* 1.7*   No results for input(s): LIPASE, AMYLASE in the last 168 hours. No results for input(s): AMMONIA in the last 168 hours. Coagulation Profile: Recent Labs  Lab 03/18/2021 0858  INR 1.0   CBC: Recent Labs  Lab 03/26/2021 0858 03/28/21 0438 03/29/21 1308 03/31/21 0413 04-15-2021 0332  WBC 16.4* 9.0 8.3 29.9* 20.0*  NEUTROABS 12.3*  --   --   --   --   HGB 14.2 11.1* 10.4* 9.7* 8.9*  HCT 45.4 35.6* 32.9* 30.7* 28.2*  MCV 92.1 93.0 93.2 93.6 91.6  PLT 158 97* 123* 148* 122*   Cardiac Enzymes: No results for input(s): CKTOTAL, CKMB, CKMBINDEX, TROPONINI in the last 168 hours. BNP: Invalid input(s): POCBNP CBG: Recent Labs  Lab 03/31/21 1913 03/31/21 2322  2021/04/15 0409 04/15/21 0829 2021-04-15 1126  GLUCAP 151* 171* 191* 206* 248*   HbA1C: Recent Labs    03/31/21 0413  HGBA1C 5.1   Urine analysis:    Component Value Date/Time   COLORURINE YELLOW 03/31/2021 0842   APPEARANCEUR CLEAR 03/31/2021 0842   LABSPEC 1.009 03/31/2021 0842   PHURINE 6.0 03/31/2021 0842   GLUCOSEU 50 (A) 03/31/2021 0842   HGBUR MODERATE (A) 03/31/2021 0842   BILIRUBINUR NEGATIVE 03/31/2021 0842   KETONESUR 5 (A) 03/31/2021 0842   PROTEINUR 30 (A) 03/31/2021 0842   UROBILINOGEN 0.2 03/25/2008 1702   NITRITE NEGATIVE 03/31/2021 0842   LEUKOCYTESUR NEGATIVE 03/31/2021 0842   Sepsis Labs: _3 (procalcitonin:4,lacticidven:4) ) Recent Results (from the past 240 hour(s))  Urine Culture     Status: None   Collection Time: 03/10/2021  5:25 AM   Specimen: In/Out Cath Urine  Result Value Ref Range Status   Specimen Description   Final    IN/OUT CATH URINE Performed at [facility], [address] [place], [place] [address]    Special Requests   Final    NONE Performed  at [facility], [address], [place], [place] [address]    Culture   Final    NO GROWTH Performed at [facility], [place] [address], [place], [place] [address]    Report Status 03/30/2021 FINAL  Final  Blood culture (routine single)     Status: None   Collection Time: 03/02/2021  8:59 AM   Specimen: BLOOD  Result Value Ref Range Status   Specimen Description BLOOD  Final   Special Requests NONE  Final   Culture   Final    NO GROWTH 5 DAYS Performed at [facility], [address] [place], [place] [address]    Report Status 02-Apr-2021 FINAL  Final  Resp Panel by RT-PCR (Flu A&B, Covid) Nasopharyngeal Swab     Status: None   Collection Time: 03/24/2021  6:36 PM   Specimen: Nasopharyngeal Swab; Nasopharyngeal(NP) swabs in vial transport medium  Result Value Ref Range Status   SARS Coronavirus 2 by RT PCR NEGATIVE NEGATIVE Final    Comment: (NOTE) SARS-CoV-2 target nucleic  acids are NOT DETECTED.  The SARS-CoV-2 RNA is generally detectable in upper respiratory specimens during the acute phase of infection. The lowest concentration of SARS-CoV-2 viral copies this assay can detect is 138 copies/mL. A negative result does not preclude SARS-Cov-2 infection and should not be used as the sole basis for treatment or other patient management decisions. A negative result may occur with  improper specimen collection/handling, submission of specimen other than nasopharyngeal swab, presence of viral mutation(s) within the areas targeted by this assay, and inadequate number of viral copies(<138 copies/mL). A negative result must be combined with clinical observations, patient history, and epidemiological information. The expected result is Negative.  Fact Sheet for Patients:  EntrepreneurPulse.com.au  Fact Sheet for Healthcare Providers:  IncredibleEmployment.be  This test is no t yet approved or cleared by the [place] FDA and  has been authorized for detection and/or diagnosis of SARS-CoV-2 by FDA under an Emergency Use Authorization (EUA). This EUA will remain  in effect (meaning this test can be used) for the duration of the COVID-19 declaration under Section 564(b)(1) of the Act, 21 U.S.C.section 360bbb-3(b)(1), unless the authorization is terminated  or revoked sooner.       Influenza A by PCR NEGATIVE NEGATIVE Final   Influenza B by PCR NEGATIVE NEGATIVE Final    Comment: (NOTE) The Xpert Xpress SARS-CoV-2/FLU/RSV plus assay is intended as an aid in the diagnosis of influenza from Nasopharyngeal swab specimens and should not be used as a sole basis for treatment. Nasal washings and aspirates are unacceptable for Xpert Xpress SARS-CoV-2/FLU/RSV testing.  Fact Sheet for Patients: EntrepreneurPulse.com.au  Fact Sheet for Healthcare Providers: IncredibleEmployment.be  This  test is not yet approved or cleared by the [place] FDA and has been authorized for detection and/or diagnosis of SARS-CoV-2 by FDA under an Emergency Use Authorization (EUA). This EUA will remain in effect (meaning this test can be used) for the duration of the COVID-19 declaration under Section 564(b)(1) of the Act, 21 U.S.C. section 360bbb-3(b)(1), unless the authorization is terminated or revoked.  Performed at [facility], [address], [place], [place] [address]   C Difficile Quick Screen w PCR reflex     Status: None   Collection Time: 03/17/2021 10:07 PM   Specimen: STOOL  Result Value Ref Range Status   C Diff antigen NEGATIVE NEGATIVE Final   C Diff toxin NEGATIVE NEGATIVE Final   C Diff interpretation No C. difficile  detected.  Final    Comment: Performed at [facility], [address] [place], [place] [address]  MRSA Next Gen by PCR, Nasal     Status: None   Collection Time: 03/28/21  5:45 AM   Specimen: Nasal Mucosa; Nasal Swab  Result Value Ref Range Status   MRSA by PCR Next Gen NOT DETECTED NOT DETECTED Final    Comment: (NOTE) The GeneXpert MRSA Assay (FDA approved for NASAL specimens only), is one component of a comprehensive MRSA colonization surveillance program. It is not intended to diagnose MRSA infection nor to guide or monitor treatment for MRSA infections. Test performance is not FDA approved in patients less than 49 years old. Performed at [facility], [address] [place], [place] [address]   Culture, blood (Routine X 2) w Reflex to ID Panel     Status: None (Preliminary result)   Collection Time: 03/30/21  9:55 AM   Specimen: BLOOD LEFT ARM  Result Value Ref Range Status   Specimen Description BLOOD LEFT ARM  Final   Special Requests   Final    BOTTLES DRAWN AEROBIC AND ANAEROBIC Blood Culture adequate volume   Culture   Final    NO GROWTH 2 DAYS Performed at [facility], [address], [place], [place] [address]    Report Status  PENDING  Incomplete  Culture, blood (Routine X 2) w Reflex to ID Panel     Status: None (Preliminary result)   Collection Time: 03/30/21  9:56 AM   Specimen: BLOOD RIGHT HAND  Result Value Ref Range Status   Specimen Description BLOOD RIGHT HAND  Final   Special Requests   Final    BOTTLES DRAWN AEROBIC AND ANAEROBIC Blood Culture results may not be optimal due to an inadequate volume of blood received in culture bottles   Culture   Final    NO GROWTH 2 DAYS Performed at [facility], [address], [place], [place] [address]    Report Status PENDING  Incomplete  MRSA Next Gen by PCR, Nasal     Status: None   Collection Time: 03/30/21 11:11 AM   Specimen: Nasal Mucosa; Nasal Swab  Result Value Ref Range Status   MRSA by PCR Next Gen NOT DETECTED NOT DETECTED Final    Comment: (NOTE) The GeneXpert MRSA Assay (FDA approved for NASAL specimens only), is one component of a comprehensive MRSA colonization surveillance program. It is not intended to diagnose MRSA infection nor to guide or monitor treatment for MRSA infections. Test performance is not FDA approved in patients less than 61 years old. Performed at [facility], [address], [place], [place] [address]   Urine Culture     Status: None   Collection Time: 03/31/21  8:41 AM   Specimen: Urine, Catheterized  Result Value Ref Range Status   Specimen Description   Final    URINE, CATHETERIZED Performed at [facility], [address] [place], [place] [address]    Special Requests   Final    NONE Performed at [facility], [address] [place], [place] [address]    Culture   Final    NO GROWTH Performed at [facility], [place] [address], [place],  [address]    Report Status 04-13-2021 FINAL  Final     Scheduled Meds:  arformoterol  15 mcg Nebulization BID   chlorhexidine gluconate (MEDLINE KIT)  15 mL Mouth Rinse BID   Chlorhexidine Gluconate Cloth  6 each Topical Daily  feeding supplement (PROSource TF)   45 mL Per Tube Daily   feeding supplement (VITAL AF 1.2 CAL)  1,000 mL Per Tube Q24H   heparin  5,000 Units Subcutaneous Q8H   ipratropium-albuterol  3 mL Nebulization Q6H   levothyroxine  75 mcg Oral Daily   mouth rinse  15 mL Mouth Rinse 10 times per day   methylPREDNISolone (SOLU-MEDROL) injection  60 mg Intravenous Q12H    morphine injection  4 mg Intravenous Once   pantoprazole sodium  40 mg Per Tube Daily   sodium chloride flush  10-40 mL Intracatheter Q12H   Continuous Infusions:  sodium chloride     sodium chloride     fentaNYL infusion INTRAVENOUS 100 mcg/hr (04-17-2021 1523)   lactated ringers 100 mL/hr at 04-17-2021 1523   meropenem (MERREM) IV 1 g (17-Apr-2021 1332)   morphine     norepinephrine (LEVOPHED) Adult infusion 1 mcg/min (Apr 17, 2021 1523)    Procedures/Studies: CT ABDOMEN PELVIS WO CONTRAST  Result Date: 03/11/2021 CLINICAL DATA:  Left upper quadrant abdominal pain and sepsis. EXAM: CT ABDOMEN AND PELVIS WITHOUT CONTRAST TECHNIQUE: Multidetector CT imaging of the abdomen and pelvis was performed following the standard protocol without IV contrast. COMPARISON:  CT scan 10/07/2019 FINDINGS: Lower chest: Emphysematous changes and pulmonary scarring. Moderate peripheral peribronchial thickening. No focal infiltrates or pleural effusions. The heart is normal in size. No pericardial effusion. Stable aortic and coronary artery calcifications. Hepatobiliary: No obvious hepatic lesions without contrast. Gallbladder is moderately distended and there may be mild gallbladder wall thickening. I do not see any definite pericholecystic inflammatory changes and no significant common bile duct dilatation. Pancreas: No mass, inflammation or ductal dilatation. Mild pancreatic atrophy. Spleen: Normal size.  No focal lesions. Adrenals/Urinary Tract: The adrenal glands and kidneys are grossly normal. No renal or obstructing ureteral calculi. No obvious renal or bladder lesions without contrast.  Stomach/Bowel: The stomach, duodenum, small bowel and colon are grossly normal without oral contrast. No obvious acute inflammatory process, mass lesions or inflammatory changes. Sigmoid colon diverticulosis without findings for acute diverticulitis. Vascular/Lymphatic: Advanced atherosclerotic calcifications involving the aorta and branch vessels but no aneurysm. No mesenteric or retroperitoneal mass or adenopathy. Reproductive: The uterus is surgically absent. The left ovary is not visualized for certain. The right ovary is still present and contains a small stable simple cyst measuring 15 mm. Other: No pelvic mass or adenopathy. No free pelvic fluid collections. No inguinal mass or adenopathy. No abdominal wall hernia or subcutaneous lesions. Musculoskeletal: Stable artifact associated with a left hip hemiarthroplasty. Extensive lucency surrounding the femoral prosthesis with protrusio. This extends all the way down into the ischial tuberosity and is likely advanced particle disease. IMPRESSION: 1. No acute abdominal/pelvic findings, mass lesions or adenopathy. 2. Moderately distended gallbladder with possible mild gallbladder wall thickening. No definite pericholecystic inflammatory changes or common bile duct dilatation. Right upper quadrant ultrasound examination may be helpful. 3. Advanced atherosclerotic calcifications involving the aorta and branch vessels. 4. Extensive lucency surrounding the left hip hemiarthroplasty with protrusion and extensive lucency surrounding the femoral prosthesis extending all the way down into the ischial tuberosity and likely advanced particle disease. Aortic Atherosclerosis (ICD10-I70.0). Electronically Signed   By: Marijo Sanes M.D.   On: 03/08/2021 12:28   CT SOFT TISSUE NECK W CONTRAST  Result Date: 03/31/2021 CLINICAL DATA:  Thyroid cancer, surveillance EXAM: CT NECK WITH CONTRAST TECHNIQUE: Multidetector CT imaging of the neck was performed using the standard  protocol following the bolus administration of intravenous contrast.  CONTRAST:  118m OMNIPAQUE IOHEXOL 350 MG/ML SOLN COMPARISON:  08/05/2020, 12/02/2020 PET-CT FINDINGS: Pharynx and larynx: The patient is intubated, with a gastric tube and endotracheal tube seen in the pharynx, limiting evaluation. No definite mass or swelling. The endotracheal tube extends off the inferior field of view in the right mainstem bronchus. The gastric tube extends off the inferior field of view in the esophagus. Salivary glands: No inflammation, mass, or stone. Thyroid: Status post thyroidectomy. Ill-defined soft tissue density mass encasing the trachea and right common carotid artery (series 12, image 60), possibly extending to the anterior right aspect of the esophagus, measuring approximately 4.6 x 4.3 cm in the axial plane, previously approximately 5.6 x 2.9 cm when remeasured similarly 12/02/2020. The mass extends superiorly along the course of the right common carotid (series 12, image 48), poorly defined. A focus of air is seen within the right aspect of the lesion, which is new compared to 12/02/2020 and may represent a laryngocele, necrosis, or less likely infection with gas-forming organism. This area is in close proximity to the right common carotid artery, without a definite fat plane. Lymph nodes: None enlarged or abnormal density. Vascular: In case mint of the right common carotid artery by the ill-defined soft tissue mass in the right neck, with mild irregularity of the vessel adjacent to a focus of air. Vasculature is otherwise patent. Limited intracranial: Negative. Previously described lesion along the right sphenoid a and right parietal convexity are not included in the field of view. Visualized orbits: Not included in the field of view. Mastoids and visualized paranasal sinuses: Mild mucosal thickening in the right maxillary sinus. The mastoids are well aerated. Skeleton: No acute osseous abnormality. Upper chest:  Please see same-day CT chest Other: None. IMPRESSION: 1. Ill-defined soft tissue density mass encasing the trachea and right common carotid artery, possibly extending to the anterior right aspect of the esophagus, concerning for papillary thyroid carcinoma, which has increased in extent compared to 12/02/2020. There is a focus of air within the right aspect of the mass, which is in close proximity to the right common carotid which appears somewhat irregular, raising concern for carotid blowout. 2. No lymphadenopathy. 3.  For findings in the thorax, please see same day CT chest. These results were called by telephone at the time of interpretation on 03/31/2021 at 4:26 pm to provider   , who verbally acknowledged these results. Electronically Signed   By: AMerilyn BabaM.D.   On: 03/31/2021 16:29   CT Angio Chest Pulmonary Embolism (PE) W or WO Contrast  Result Date: 03/31/2021 CLINICAL DATA:  Chronic respiratory failure. Recent COVID pneumonia. Hypoxic and septic. EXAM: CT ANGIOGRAPHY CHEST WITH CONTRAST TECHNIQUE: Multidetector CT imaging of the chest was performed using the standard protocol during bolus administration of intravenous contrast. Multiplanar CT image reconstructions and MIPs were obtained to evaluate the vascular anatomy. CONTRAST:  1045mOMNIPAQUE IOHEXOL 350 MG/ML SOLN COMPARISON:  01/23/2020 FINDINGS: Cardiovascular: The heart is normal in size. No pericardial effusion. The aorta demonstrates mild tortuosity and moderate atherosclerotic calcification but no focal aneurysm dissection. The branch vessels are patent. Stable three-vessel coronary artery calcifications. The pulmonary arterial tree is well opacified. No filling defects to suggest pulmonary embolism. Mediastinum/Nodes: No mediastinal or hilar mass or lymphadenopathy. Small amount of fluid in the pericardial recesses. There is an NG tube coursing down the esophagus and into the stomach. The endotracheal tube is in the right  mainstem bronchus and should be retracted at least 3 cm. Lungs/Pleura:  Chronic emphysematous and bronchitic lung changes with probable post pneumonic scarring changes also. Some residual inflammatory changes mainly in the right middle and upper lobes. Lower lobe bronchial wall thickening and distal endobronchial debris mainly in the right lower lobe. Aspiration would certainly be a consideration. There are small bilateral pleural effusions and overlying basilar atelectasis. Upper Abdomen: No significant upper abdominal findings. Free fluid is noted around the spleen. Aortic calcifications. Musculoskeletal: No significant bony findings. Review of the MIP images confirms the above findings. IMPRESSION: 1. No CT findings for pulmonary embolism. 2. Chronic emphysematous and bronchitic lung changes with probable post pneumonic scarring changes. 3. Some residual inflammatory changes mainly in the right middle and upper lobes. 4. Lower lobe bronchial wall thickening and distal endobronchial debris mainly in the right lower lobe. Aspiration would certainly be a consideration. 5. Small bilateral pleural effusions and overlying basilar atelectasis. 6. The endotracheal tube is in the right mainstem bronchus and should be retracted at least 3 cm. 7. Aortic atherosclerosis. Aortic Atherosclerosis (ICD10-I70.0) and Emphysema (ICD10-J43.9). These results will be called to the ordering clinician or representative by the Radiologist Assistant, and communication documented in the PACS or [employer]. Electronically Signed   By: Marijo Sanes M.D.   On: 03/31/2021 14:54   NM Hepato W/EF  Result Date: 03/29/2021 CLINICAL DATA:  Abdominal pain and nausea for few days, cholecystitis EXAM: NUCLEAR MEDICINE HEPATOBILIARY IMAGING TECHNIQUE: Sequential images of the abdomen were obtained out to 60 minutes following intravenous administration of radiopharmaceutical. RADIOPHARMACEUTICALS:  5.13 mCi Tc-2m Choletec IV COMPARISON:  CT  abdomen and pelvis 03/25/2021, ultrasound abdomen RIGHT upper quadrant 03/28/2021 FINDINGS: Normal tracer clearance from bloodstream. Delayed concentration and excretion of tracer into small bowel. Small bowel visualized at 55 minutes. At 1 hour, gallbladder had not visualized. Patient then received 1.7 mg of morphine IV and imaging was continued for 30 minutes. Gallbladder failed to visualize following morphine administration. Findings are consistent with cystic duct obstruction and acute cholecystitis. IMPRESSION: Nonvisualization of the gallbladder despite morphine administration consistent with acute cholecystitis. Please note that false-positive exams can occur in patients with prolonged fasting. Electronically Signed   By: MLavonia DanaM.D.   On: 03/29/2021 12:51   DG Chest Port 1 View  Result Date: 03/31/2021 CLINICAL DATA:  Intubation EXAM: PORTABLE CHEST 1 VIEW COMPARISON:  03/30/2021 FINDINGS: Support Apparatus: --Endotracheal tube: Tip 1.5 cm above the inferior margin of the carina. --Enteric tube:Tip and sideport project over the stomach. --Vascular catheter(s):None --Other: None The lungs are hyperinflated with diffuse interstitial prominence. No focal airspace consolidation or pulmonary edema. No pleural effusion or pneumothorax. Normal cardiomediastinal contours. There is biapical scarring. IMPRESSION: Endotracheal tube tip 1.5 cm above the inferior margin of the carina. Electronically Signed   By: KUlyses JarredM.D.   On: 03/31/2021 03:01   DG CHEST PORT 1 VIEW  Result Date: 03/30/2021 CLINICAL DATA:  Acute respiratory failure with hypoxia EXAM: PORTABLE CHEST 1 VIEW COMPARISON:  03/06/2021 FINDINGS: Slightly increased interstitial prominence. No new consolidation. No pleural effusion. Stable cardiomediastinal contours. IMPRESSION: Slightly increased interstitial prominence since 03/08/2021 may reflect edema or atypical/viral pneumonia. Electronically Signed   By: PMacy MisM.D.   On:  03/30/2021 12:45   DG Chest Port 1 View  Result Date: 03/19/2021 CLINICAL DATA:  Fever, sepsis EXAM: PORTABLE CHEST 1 VIEW COMPARISON:  Previous studies including the examination of 02/21/2021 FINDINGS: Cardiac size is within normal limits. Low position of diaphragms suggests COPD. Increased interstitial markings in both apices  suggest scarring. No new focal infiltrates are seen. There is no pleural effusion or pneumothorax. IMPRESSION: COPD. There are no signs of pulmonary edema or focal pulmonary consolidation. Electronically Signed   By: Elmer Picker M.D.   On: 03/20/2021 10:07   [place] EKG SITE RITE  Result Date: 03/31/2021 If Site Rite image not attached, placement could not be confirmed due to current cardiac rhythm.  [place] EKG SITE RITE  Result Date: 03/30/2021 If Site Rite image not attached, placement could not be confirmed due to current cardiac rhythm.  US Abdomen Limited RUQ (LIVER/GB)  Result Date: 03/28/2021 CLINICAL DATA:  Possible gallbladder wall thickening on CT. EXAM: ULTRASOUND ABDOMEN LIMITED RIGHT UPPER QUADRANT COMPARISON:  CT abdomen pelvis from yesterday. FINDINGS: Gallbladder: Layering sludge with mild wall thickening visualized. No sonographic Murphy sign noted by sonographer. Trace pericholecystic fluid. Common bile duct: Diameter: 3 mm, normal. Liver: No focal lesion identified. Within normal limits in parenchymal echogenicity. Portal vein is patent on color Doppler imaging with normal direction of blood flow towards the liver. Other: None. IMPRESSION: 1. Gallbladder sludge with mild wall thickening and trace pericholecystic fluid. No sonographic Murphy sign. Findings are equivocal for acute acalculous cholecystitis. Consider further evaluation with HIDA scan. Electronically Signed   By: Titus Dubin M.D.   On: 03/28/2021 12:56    Orson Eva, DO  [facility]  If 7PM-7AM, please contact night-coverage [email or web address] Password [email or web address] 04-23-2021, 4:34 PM    LOS: 5 days

## 2021-04-28 DEATH — deceased

## 2021-05-12 ENCOUNTER — Ambulatory Visit: Payer: Medicare Other | Admitting: Internal Medicine

## 2021-07-04 IMAGING — NM NM [ID] THYROID CANCER METS SP CA TX
2 series · 2 of 2 positions shown · non-contrast
Comparison: None

CLINICAL DATA: Thyroid cancer post thyroidectomy and postoperative
radioactive iodine ablation

EXAM:
NUCLEAR MEDICINE 0-QJQ POST THERAPY WHOLE BODY SCAN
TECHNIQUE: The patient received 103 mCi 0-QJQ sodium iodide for the treatment
of thyroid cancer within the past 10 days. The patient returns
today, and whole body scanning was performed in the anterior and
posterior projections.

[Series 1: i131 whole body · 2.66mm/px · 1 of 1 slices shown (1 of 2)]
[im 1/1]
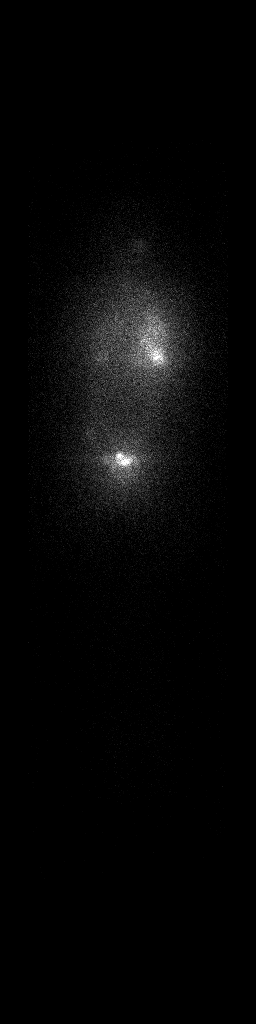

[Series 1: i131 whole body · 2.66mm/px · 1 of 1 slices shown (2 of 2)]
[im 1/1]
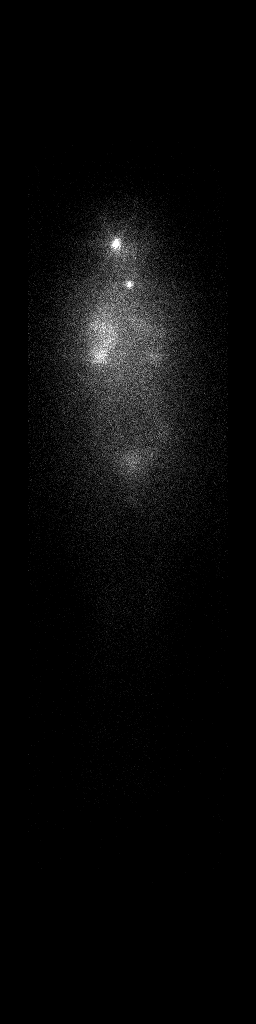

[2 of 2 positions shown; findings below may reference images not displayed]

FINDINGS: Single focus of tracer uptake at the LEFT thyroid bed consistent
with remnant.

Focal abnormal increased tracer uptake at the superior RIGHT
cervical region versus RIGHT skull base concerning for metastasis.

Excreted tracer within colon and bladder.

Abnormal tracer uptake at inferior RIGHT hemithorax, cannot exclude
pulmonary metastases. An additional area of increased tracer uptake
is seen at either the inferior RIGHT chest or less likely upper
RIGHT abdomen/liver on the posterior view, uncertain if represents
in RIGHT lung base or liver.
IMPRESSION: LEFT thyroid remnant.

Focal increased tracer uptake at the inferior RIGHT cervical region
question metastatic focus to high cervical lymph node or skull base.

Abnormal tracer uptake at the inferior RIGHT hemithorax concerning
for metastasis.

Consider further assessment by either noncontrast CT or PET-CT;
would avoid iodinated contrast material.
